# Patient Record
Sex: Female | Born: 1960
Health system: Southern US, Community
[De-identification: ages and names within clinical notes are randomized; demographics above are authoritative.]

## PROBLEM LIST (undated history)

## (undated) DIAGNOSIS — G4733 Obstructive sleep apnea (adult) (pediatric): Secondary | ICD-10-CM

## (undated) DIAGNOSIS — I4819 Other persistent atrial fibrillation: Secondary | ICD-10-CM

## (undated) DIAGNOSIS — R0902 Hypoxemia: Secondary | ICD-10-CM

## (undated) DIAGNOSIS — I4892 Unspecified atrial flutter: Secondary | ICD-10-CM

## (undated) DIAGNOSIS — F32A Depression, unspecified: Secondary | ICD-10-CM

## (undated) DIAGNOSIS — K219 Gastro-esophageal reflux disease without esophagitis: Secondary | ICD-10-CM

## (undated) DIAGNOSIS — I2699 Other pulmonary embolism without acute cor pulmonale: Secondary | ICD-10-CM

## (undated) DIAGNOSIS — R7303 Prediabetes: Secondary | ICD-10-CM

## (undated) DIAGNOSIS — I509 Heart failure, unspecified: Secondary | ICD-10-CM

## (undated) DIAGNOSIS — I10 Essential (primary) hypertension: Secondary | ICD-10-CM

## (undated) DIAGNOSIS — F419 Anxiety disorder, unspecified: Secondary | ICD-10-CM

## (undated) DIAGNOSIS — J449 Chronic obstructive pulmonary disease, unspecified: Secondary | ICD-10-CM

## (undated) DIAGNOSIS — E119 Type 2 diabetes mellitus without complications: Secondary | ICD-10-CM

## (undated) DIAGNOSIS — D649 Anemia, unspecified: Secondary | ICD-10-CM

## (undated) DIAGNOSIS — C801 Malignant (primary) neoplasm, unspecified: Secondary | ICD-10-CM

## (undated) DIAGNOSIS — G4730 Sleep apnea, unspecified: Secondary | ICD-10-CM

## (undated) DIAGNOSIS — F329 Major depressive disorder, single episode, unspecified: Secondary | ICD-10-CM

## (undated) DIAGNOSIS — E785 Hyperlipidemia, unspecified: Secondary | ICD-10-CM

## (undated) DIAGNOSIS — I5032 Chronic diastolic (congestive) heart failure: Secondary | ICD-10-CM

## (undated) DIAGNOSIS — H269 Unspecified cataract: Secondary | ICD-10-CM

## (undated) DIAGNOSIS — N189 Chronic kidney disease, unspecified: Secondary | ICD-10-CM

## (undated) DIAGNOSIS — T7840XA Allergy, unspecified, initial encounter: Secondary | ICD-10-CM

## (undated) HISTORY — DX: Chronic diastolic (congestive) heart failure: I50.32

## (undated) HISTORY — DX: Hypoxemia: R09.02

## (undated) HISTORY — DX: Unspecified cataract: H26.9

## (undated) HISTORY — DX: Malignant (primary) neoplasm, unspecified: C80.1

## (undated) HISTORY — PX: OTHER SURGICAL HISTORY: SHX169

## (undated) HISTORY — DX: Major depressive disorder, single episode, unspecified: F32.9

## (undated) HISTORY — DX: Sleep apnea, unspecified: G47.30

## (undated) HISTORY — DX: Gastro-esophageal reflux disease without esophagitis: K21.9

## (undated) HISTORY — DX: Obstructive sleep apnea (adult) (pediatric): G47.33

## (undated) HISTORY — DX: Chronic obstructive pulmonary disease, unspecified: J44.9

## (undated) HISTORY — DX: Prediabetes: R73.03

## (undated) HISTORY — DX: Type 2 diabetes mellitus without complications: E11.9

## (undated) HISTORY — DX: Hyperlipidemia, unspecified: E78.5

## (undated) HISTORY — DX: Anxiety disorder, unspecified: F41.9

## (undated) HISTORY — DX: Other pulmonary embolism without acute cor pulmonale: I26.99

## (undated) HISTORY — DX: Unspecified atrial flutter: I48.92

## (undated) HISTORY — DX: Essential (primary) hypertension: I10

## (undated) HISTORY — DX: Depression, unspecified: F32.A

## (undated) HISTORY — DX: Chronic kidney disease, unspecified: N18.9

## (undated) HISTORY — DX: Allergy, unspecified, initial encounter: T78.40XA

## (undated) HISTORY — DX: Anemia, unspecified: D64.9

---

## 1994-02-08 HISTORY — PX: TUBAL LIGATION: SHX77

## 1998-01-12 ENCOUNTER — Emergency Department (HOSPITAL_COMMUNITY): Admission: EM | Admit: 1998-01-12 | Discharge: 1998-01-12 | Payer: Self-pay | Admitting: Emergency Medicine

## 1998-12-02 ENCOUNTER — Ambulatory Visit (HOSPITAL_COMMUNITY): Admission: RE | Admit: 1998-12-02 | Discharge: 1998-12-02 | Payer: Self-pay | Admitting: Obstetrics and Gynecology

## 1998-12-02 ENCOUNTER — Encounter: Payer: Self-pay | Admitting: Obstetrics and Gynecology

## 2001-12-15 ENCOUNTER — Other Ambulatory Visit: Admission: RE | Admit: 2001-12-15 | Discharge: 2001-12-15 | Payer: Self-pay | Admitting: Family Medicine

## 2001-12-20 ENCOUNTER — Encounter: Admission: RE | Admit: 2001-12-20 | Discharge: 2001-12-20 | Payer: Self-pay

## 2002-02-21 ENCOUNTER — Ambulatory Visit (HOSPITAL_COMMUNITY): Admission: RE | Admit: 2002-02-21 | Discharge: 2002-02-21 | Payer: Self-pay | Admitting: Gastroenterology

## 2003-09-12 ENCOUNTER — Other Ambulatory Visit: Admission: RE | Admit: 2003-09-12 | Discharge: 2003-09-12 | Payer: Self-pay | Admitting: Family Medicine

## 2003-09-19 ENCOUNTER — Encounter: Admission: RE | Admit: 2003-09-19 | Discharge: 2003-09-19 | Payer: Self-pay | Admitting: Family Medicine

## 2004-09-17 ENCOUNTER — Other Ambulatory Visit: Admission: RE | Admit: 2004-09-17 | Discharge: 2004-09-17 | Payer: Self-pay | Admitting: Family Medicine

## 2004-09-17 ENCOUNTER — Encounter: Admission: RE | Admit: 2004-09-17 | Discharge: 2004-09-17 | Payer: Self-pay | Admitting: Family Medicine

## 2004-10-07 ENCOUNTER — Encounter: Admission: RE | Admit: 2004-10-07 | Discharge: 2004-10-07 | Payer: Self-pay | Admitting: Family Medicine

## 2005-12-06 ENCOUNTER — Other Ambulatory Visit: Admission: RE | Admit: 2005-12-06 | Discharge: 2005-12-06 | Payer: Self-pay | Admitting: Internal Medicine

## 2006-01-04 ENCOUNTER — Encounter: Payer: Self-pay | Admitting: Pulmonary Disease

## 2006-01-25 ENCOUNTER — Encounter: Payer: Self-pay | Admitting: Pulmonary Disease

## 2006-12-15 ENCOUNTER — Ambulatory Visit (HOSPITAL_COMMUNITY): Admission: RE | Admit: 2006-12-15 | Discharge: 2006-12-15 | Payer: Self-pay | Admitting: Internal Medicine

## 2007-04-20 ENCOUNTER — Emergency Department (HOSPITAL_COMMUNITY): Admission: EM | Admit: 2007-04-20 | Discharge: 2007-04-20 | Payer: Self-pay | Admitting: Emergency Medicine

## 2007-05-25 ENCOUNTER — Ambulatory Visit: Payer: Self-pay

## 2007-05-29 ENCOUNTER — Encounter: Payer: Self-pay | Admitting: Internal Medicine

## 2007-05-29 ENCOUNTER — Ambulatory Visit: Payer: Self-pay | Admitting: Internal Medicine

## 2007-05-30 ENCOUNTER — Ambulatory Visit: Payer: Self-pay | Admitting: Cardiology

## 2007-05-30 ENCOUNTER — Ambulatory Visit: Payer: Self-pay

## 2007-06-02 ENCOUNTER — Ambulatory Visit: Payer: Self-pay | Admitting: Cardiology

## 2007-06-09 ENCOUNTER — Ambulatory Visit: Payer: Self-pay | Admitting: Internal Medicine

## 2007-06-09 ENCOUNTER — Ambulatory Visit: Payer: Self-pay

## 2007-06-09 ENCOUNTER — Ambulatory Visit: Payer: Self-pay | Admitting: Cardiology

## 2007-06-09 ENCOUNTER — Inpatient Hospital Stay (HOSPITAL_COMMUNITY): Admission: AD | Admit: 2007-06-09 | Discharge: 2007-06-13 | Payer: Self-pay | Admitting: Internal Medicine

## 2007-06-16 ENCOUNTER — Ambulatory Visit: Payer: Self-pay | Admitting: Cardiology

## 2007-06-16 ENCOUNTER — Ambulatory Visit: Payer: Self-pay | Admitting: Internal Medicine

## 2007-06-19 ENCOUNTER — Ambulatory Visit: Payer: Self-pay

## 2007-06-23 ENCOUNTER — Ambulatory Visit: Payer: Self-pay | Admitting: Internal Medicine

## 2007-06-29 ENCOUNTER — Ambulatory Visit: Payer: Self-pay | Admitting: Internal Medicine

## 2007-07-13 ENCOUNTER — Ambulatory Visit: Payer: Self-pay | Admitting: Internal Medicine

## 2007-07-20 DIAGNOSIS — K219 Gastro-esophageal reflux disease without esophagitis: Secondary | ICD-10-CM | POA: Insufficient documentation

## 2007-07-20 DIAGNOSIS — F331 Major depressive disorder, recurrent, moderate: Secondary | ICD-10-CM | POA: Insufficient documentation

## 2007-07-21 ENCOUNTER — Ambulatory Visit: Payer: Self-pay | Admitting: Pulmonary Disease

## 2007-07-21 DIAGNOSIS — G4733 Obstructive sleep apnea (adult) (pediatric): Secondary | ICD-10-CM | POA: Insufficient documentation

## 2007-07-21 DIAGNOSIS — J449 Chronic obstructive pulmonary disease, unspecified: Secondary | ICD-10-CM | POA: Insufficient documentation

## 2007-07-27 ENCOUNTER — Ambulatory Visit: Payer: Self-pay | Admitting: Cardiology

## 2007-08-08 ENCOUNTER — Ambulatory Visit: Payer: Self-pay | Admitting: Internal Medicine

## 2007-08-15 ENCOUNTER — Ambulatory Visit: Payer: Self-pay | Admitting: Cardiology

## 2007-08-30 ENCOUNTER — Ambulatory Visit: Payer: Self-pay | Admitting: Internal Medicine

## 2007-08-30 LAB — CONVERTED CEMR LAB
BUN: 13 mg/dL (ref 6–23)
CO2: 31 meq/L (ref 19–32)
Calcium: 8.6 mg/dL (ref 8.4–10.5)
Chloride: 103 meq/L (ref 96–112)
Creatinine, Ser: 1.2 mg/dL (ref 0.4–1.2)
GFR calc Af Amer: 62 mL/min
GFR calc non Af Amer: 51 mL/min
Glucose, Bld: 133 mg/dL — ABNORMAL HIGH (ref 70–99)
Potassium: 3.1 meq/L — ABNORMAL LOW (ref 3.5–5.1)
Sodium: 141 meq/L (ref 135–145)

## 2007-09-05 ENCOUNTER — Ambulatory Visit: Payer: Self-pay | Admitting: Cardiology

## 2007-09-15 ENCOUNTER — Ambulatory Visit: Payer: Self-pay | Admitting: Pulmonary Disease

## 2007-09-19 ENCOUNTER — Ambulatory Visit: Payer: Self-pay | Admitting: Internal Medicine

## 2007-09-19 LAB — CONVERTED CEMR LAB
BUN: 17 mg/dL (ref 6–23)
CO2: 30 meq/L (ref 19–32)
Calcium: 9.1 mg/dL (ref 8.4–10.5)
Chloride: 102 meq/L (ref 96–112)
Creatinine, Ser: 1 mg/dL (ref 0.4–1.2)
GFR calc Af Amer: 76 mL/min
GFR calc non Af Amer: 63 mL/min
Glucose, Bld: 124 mg/dL — ABNORMAL HIGH (ref 70–99)
Potassium: 3.2 meq/L — ABNORMAL LOW (ref 3.5–5.1)
Sodium: 140 meq/L (ref 135–145)

## 2007-09-26 ENCOUNTER — Ambulatory Visit: Payer: Self-pay | Admitting: Cardiology

## 2007-10-06 ENCOUNTER — Ambulatory Visit: Payer: Self-pay | Admitting: Internal Medicine

## 2007-10-24 ENCOUNTER — Ambulatory Visit: Payer: Self-pay | Admitting: Cardiovascular Disease

## 2007-10-24 ENCOUNTER — Ambulatory Visit: Payer: Self-pay | Admitting: Internal Medicine

## 2007-10-24 LAB — CONVERTED CEMR LAB
BUN: 15 mg/dL (ref 6–23)
CO2: 32 meq/L (ref 19–32)
Calcium: 8.8 mg/dL (ref 8.4–10.5)
Chloride: 108 meq/L (ref 96–112)
Creatinine, Ser: 0.9 mg/dL (ref 0.4–1.2)
GFR calc Af Amer: 86 mL/min
GFR calc non Af Amer: 71 mL/min
Glucose, Bld: 124 mg/dL — ABNORMAL HIGH (ref 70–99)
Potassium: 3.2 meq/L — ABNORMAL LOW (ref 3.5–5.1)
Sodium: 142 meq/L (ref 135–145)

## 2007-11-03 ENCOUNTER — Ambulatory Visit: Payer: Self-pay | Admitting: Internal Medicine

## 2007-11-21 ENCOUNTER — Ambulatory Visit: Payer: Self-pay | Admitting: Cardiovascular Disease

## 2007-11-21 ENCOUNTER — Ambulatory Visit: Payer: Self-pay | Admitting: Internal Medicine

## 2007-11-21 LAB — CONVERTED CEMR LAB
BUN: 10 mg/dL (ref 6–23)
CO2: 33 meq/L — ABNORMAL HIGH (ref 19–32)
Calcium: 8.9 mg/dL (ref 8.4–10.5)
Chloride: 106 meq/L (ref 96–112)
Creatinine, Ser: 1 mg/dL (ref 0.4–1.2)
GFR calc Af Amer: 76 mL/min
GFR calc non Af Amer: 63 mL/min
Glucose, Bld: 122 mg/dL — ABNORMAL HIGH (ref 70–99)
Potassium: 3.4 meq/L — ABNORMAL LOW (ref 3.5–5.1)
Sodium: 144 meq/L (ref 135–145)

## 2007-12-01 ENCOUNTER — Ambulatory Visit: Payer: Self-pay | Admitting: Internal Medicine

## 2007-12-04 ENCOUNTER — Ambulatory Visit: Payer: Self-pay | Admitting: Internal Medicine

## 2007-12-12 ENCOUNTER — Ambulatory Visit: Payer: Self-pay | Admitting: Internal Medicine

## 2008-07-10 ENCOUNTER — Encounter: Payer: Self-pay | Admitting: *Deleted

## 2008-08-14 ENCOUNTER — Encounter: Payer: Self-pay | Admitting: *Deleted

## 2008-09-20 ENCOUNTER — Encounter: Payer: Self-pay | Admitting: Cardiology

## 2008-09-24 ENCOUNTER — Encounter (INDEPENDENT_AMBULATORY_CARE_PROVIDER_SITE_OTHER): Payer: Self-pay | Admitting: *Deleted

## 2009-12-15 ENCOUNTER — Ambulatory Visit (HOSPITAL_COMMUNITY): Admission: RE | Admit: 2009-12-15 | Discharge: 2009-12-15 | Payer: Self-pay | Admitting: Internal Medicine

## 2010-03-10 NOTE — Medication Information (Signed)
Summary: [facility]madin Clinic    Referring MD: [name] PCP: Rachel Moulds Indication 1: Atrial Fibrillation (ICD-427.31) [place] Site: [place] INR RANGE 2 - 3           Allergies: 1)  ! Sulfa  Anticoagulation Management History:      Negative risk factors for bleeding include an age less than 52 years old.  The bleeding index is 'low risk'.  Negative CHADS2 values include Age > 30 years old.  The start date was 05/22/2007.    Anticoagulation Management Assessment/Plan:      The patient's current anticoagulation dose is Warfarin sodium 1 mg  tabs: as directed.  The target INR is 2 - 3.         Prior Anticoagulation Instructions: 5MG QD

## 2010-03-10 NOTE — Assessment & Plan Note (Signed)
Summary: rov for osa   Referred by:  Bensimhon PCP:  Rachel Moulds  Chief Complaint:  4 wk followup.  Pt states that she has been using cpap "on and off".  She accidentally pushed a button on her machine and thinks she changed the pressure.  She states before she did this and she noticed a definite improvement in her sleep and had more energy during the day.  Pt brought her machine today.Marland Kitchen  History of Present Illness: the patient comes in today for followup of her obstructive sleep apnea. At the last visit, she was started on CPAP at a moderate pressure to help with desensitization. Patient states that she was compliant with the device, and actually started seeing an improvement in how she slept and felt the next day. Unfortunately, she recently hit one of the buttons on her device during the middle of sleep, and since then the pressure has been different and abnormal. She has not been able to wear it consistently since that time. She is having no difficulties with her mask fit or leaks.     Current Allergies: ! SULFA      Vital Signs:  Patient Profile:   50 Years Old Female Height:     59 inches Weight:      231 pounds O2 Sat:      93 % O2 treatment:    Room Air Temp:     98.1 degrees F oral Pulse rate:   78 / minute BP sitting:   110 / 70  (left arm)  Vitals Entered By: Tilden Dome (September 15, 2007 4:00 PM)                 Physical Exam  General:     obese female in no acute distress Nose:     no skin breakdown or pressure necrosis from the CPAP mask      Impression & Recommendations:  Problem # 1:  OBSTRUCTIVE SLEEP APNEA (ICD-327.23) the patient had been doing well with her CPAP until she hit a button on the device which changed the settings. I will have her DME look at her machine and educate her about the various buttons, but she will also need pressure optimization with an auto titrating device. I have also encouraged her to work aggressively on weight  loss. I will call her with the results of her auto titrating machine.  Medications Added to Medication List This Visit: 1)  Klor-con 20 Meq Pack (Potassium chloride) .... Once daily 2)  Chlorthalidone 25 Mg Tabs (Chlorthalidone) .... Once daily 3)  Excedrin Migraine 250-250-65 Mg Tabs (Aspirin-acetaminophen-caffeine) .... As needed 4)  Cpap 10  .... At bedtime   Patient Instructions: 1)  will get apria to check your machine 2)  will get auto machine as a loaner for you to use for 2 wks to optimize your pressure. 3)  work on weight loss 4)  f/u with me in 68mo.   ]

## 2010-03-10 NOTE — Assessment & Plan Note (Signed)
Summary: consult for osa   Referred by:  Bensimhon PCP:  Rachel Moulds  Chief Complaint:  Sleep Consult.  History of Present Illness: the patient is a pleasant 50 year old female who been asked to see for obstructive sleep apnea.  She was diagnosed with very severe sleep apnea and 2007, with her sleep study showing an AHI of 70 events/hr and O2 desaturation as low as 79%.  The patient was placed on CPAP at an unknown pressure, and used for approximately 2 months.  She began to have issues with the logistics of the device, and was bothered the most by the tubing and also the pressure changes throughout the night.  This makes me wonder if she was on an auto set device?  The patient discontinued the CPAP after two months, and admits that she really did not give it a fair chance.  Currently, the patient typically goes to bed between 9 and 11, and arises at 630 to start her day.  She is not rested upon arising.  She has been told that she still has significant snoring.  Surprisingly, the patient denies inappropriate daytime sleepiness, and feels that her issues in the past may have been due to medication.  Of note, the patient's weight is up 20 pounds since her last sleep study.     Current Allergies: ! SULFA  Past Medical History:    hypertension    hyperlipidemia    borderline diabetes    obstructive sleep apnea    paroxysmal atrial fibrillation    G E R D    Depression    anxiety   Family History:    cancer-colon mother    coronary artery disease- mother & father    emphysema: father    asthma: father  Social History:    married with 4 children    Patient states former smoker.    Risk Factors:  Tobacco use:  quit    Year quit:  03/1998    Pack-years:  3 to 4 ppd    Comments:  smoked x 20 years  Alcohol use:  no   Review of Systems      See HPI   Vital Signs:  Patient Profile:   50 Years Old Female Height:     59 inches Weight:      225.25 pounds O2 Sat:      97  % O2 treatment:    Room Air Temp:     98.3 degrees F oral Pulse rate:   81 / minute BP sitting:   110 / 64  (left arm) Cuff size:   regular  Vitals Entered By: Valrie Hart LPN (July 20, 6801 2:12 PM)             Comments Medications reviewed with patient Valrie Hart LPN  July 20, 2480 5:00 PM      Physical Exam  General:     morbidly obese female in no acute distress Eyes:     PERRLA and EOMI.   Nose:     deviated septum to the left Mouth:     elongation of the soft palate and uvula Neck:     no JVD, thyromegaly, or lymphadenopathy. Lungs:     totally clear to auscultation Heart:     regular rate and rhythm, no enlarging Abdomen:     soft and nontender, bowel sounds present Extremities:     trace edema, prominent varicosities Neurologic:     alert and oriented, moves all 4 extremities.  Impression & Recommendations:  Problem # 1:  OBSTRUCTIVE SLEEP APNEA (ICD-327.23) the patient has a history of severe obstructive sleep apnea by prior sleep study.  She has been intolerant of CPAP in the past, though admits that she really did not give it a fair chance.  I had a long discussion with her about sleep apnea, including its impact on her quality of life and cardiovascular health.  I have also discussed with her its impact on cardiac arrhythmias, and that it is essential we treat her sleep apnea.  I think that she would do better with a full face mask, and will start her on CPAP at a moderate pressure level until we can get her desensitized.  She will is to call with any issues over the next 4 weeks so that we can troubleshoot the device.  I also encouraged her to work aggressively on weight loss.  Medications Added to Medication List This Visit: 1)  Metoprolol Tartrate 50 Mg Tabs (Metoprolol tartrate) .... Take 1/2 tab by mouth two times a day   Patient Instructions: 1)  start cpap at 10cm with full face mask 2)  work on weight loss 3)  f/u with me in 4  weeks.   ]

## 2010-03-10 NOTE — Miscellaneous (Signed)
Summary: Orders Update pft charges  Clinical Lists Changes  Orders: Added new Service order of Carbon Monoxide diffusing w/capacity (94720) - Signed Added new Service order of Lung Volumes (94240) - Signed Added new Service order of Spirometry (Pre & Post) (94060) - Signed 

## 2010-03-10 NOTE — Letter (Signed)
Summary: [facility] [place], [place]  [address] [place]   [place], [place] [address]   Phone: [phone]  Fax: [phone]     September 24, 2008 MRN: [id number]   [place], [place]  [address]   Dear [name],  This letter is being sent to you as a reminder that it is necessary for you to get your INR/PT checked regularly so that we can optimize your care.  Our records indicate that you were scheduled to have a test done recently.  As of today, we have not received the results of this test.  It is very important that you have your INR checked.  Please call our office at the number listed above to schedule an appointment at your earliest convenience.    If you have recently had your protime checked or have discontinued this medication, please contact our office at the above phone number to clarify this issue.  Thank you for this prompt attention to this important health care matter.  Sincerely,   [facility]

## 2010-06-23 NOTE — H&P (Signed)
[name], [place] NO.:  [id number]   MEDICAL RECORD NO.:  [id number]          PATIENT TYPE:  INP   LOCATION:  2025                         FACILITY:  [facility]   PHYSICIAN:  Shaune Pascal. Bensimhon, MDDATE OF BIRTH:  [birth year]   DATE OF ADMISSION:  06/09/2007  DATE OF DISCHARGE:                              HISTORY & PHYSICAL   PRIMARY CARE PHYSICIAN:  Dr. Rachel Moulds.   REASON FOR ADMISSION:  Rapid atrial fibrillation.   HISTORY OF PRESENT ILLNESS:  [name] is a delightful 50 year old  woman with a history of obesity, hypertension, hyperlipidemia,  borderline diabetes and obstructive sleep apnea with noncompliance to  CPAP.  I saw her for the first time several weeks ago.  She was  diagnosed with paroxysmal atrial fibrillation.  She had a Life Watch  monitor on, and she had multiple episodes of atrial fibrillation with  heart rates over 150.  We considered admitting her then, but she was  quite against this so we started her on a beta blocker and also  eventually added in diltiazem which she was unable to tolerate.  She had  an echocardiogram that showed normal LV function.   She came in today for a treadmill Myoview, and she was back in atrial  fibrillation with a heart rate of 160.  She says she has felt like she  has been in out.  She says this makes her feel short of breath and a  little funny, but she has not had recent chest pain.  She has had some  before.   We gave her a dose of 5 mg of Lopressor in the clinic, and she  eventually converted to sinus rhythm in the 80s.  She is now  asymptomatic.   PROBLEM LIST:  1. Paroxysmal atrial fibrillation with high ventricular rates.  2. Obesity.  3. Diabetes.  4. Hypertension.  5. Hyperlipidemia.  6. Sleep apnea, noncompliant with CPAP.  7. Gastroesophageal reflux disease.  8. Anxiety/depression.   CURRENT MEDICATIONS:  1. Metoprolol 50 b.i.d.  2. Trazodone 100 daily.  3. Cymbalta 60 daily.  4.  Coumadin.   ALLERGIES:  SULFA.   REVIEW OF SYSTEMS:  Notable for palpitations and exertional dyspnea as  well as arthritis pain and fatigue.  The remainder of review of systems  is negative except for HPI and problem list.  There has been no  bleeding.   SOCIAL HISTORY:  She is married, has 4 kids.  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  She used to smoke 3-4 packs of  cigarettes a day for 20 years, quit in February 2000.  No alcohol use.  No drug use.   FAMILY HISTORY:  Mother died at 95 from colon cancer.  Father 98 and  alive.  There is a history of coronary artery disease on both sides of  the family.   PHYSICAL EXAM:  She is in no acute distress, ambulatory in our clinic  without respiratory difficulty.  Blood pressure is 120/70, heart rate is now 80.  HEENT:  Normal.  NECK:  Supple.  There is no JVD.  Carotids are 2+ bilaterally without  bruits.  There is no lymphadenopathy or thyromegaly.  CARDIAC:  She initially had a rapid irregular rhythm; now she is in  normal rhythm.  No murmurs.  LUNGS:  Clear.  ABDOMEN:  Obese, nontender, nondistended, no hepatosplenomegaly, no  bruits.  No masses.  Good bowel sounds.  EXTREMITIES:  Warm with no  cyanosis, clubbing or edema.  No rash.  NEURO:  Alert and x3.  Cranial nerves 2-12 are intact.  Moves all 4  extremities without difficulty.  Affect is very pleasant.   EKG shows atrial fibrillation with rapid ventricular response at a rate  of 160.  Followup EKG shows normal sinus rhythm at a rate of 80, no ST-T  wave abnormalities.   ASSESSMENT:  1. Paroxysmal atrial fibrillation with rapid ventricular response not      responding well to arteriovenous nodal blockers.  2. Hypertension.  3. Hyperlipidemia.  4. Obesity.   DISPOSITION:  She is failing outpatient rate control management of her  atrial fibrillation.  We will admit her for a Tikosyn load.  She will be  loaded on Coumadin as well.  She will  eventually need to get a stress  test to rule out underlying coronary artery disease.      Shaune Pascal. Bensimhon, MD  Electronically Signed     DRB/MEDQ  D:  06/09/2007  T:  06/09/2007  Job:  [id number]

## 2010-06-23 NOTE — Assessment & Plan Note (Signed)
[facility]                            CARDIOLOGY OFFICE NOTE   [name]                      MRN:          [id number]  DATE:12/04/2007                            DOB:          September 13, 1960    PRIMARY CARE PHYSICIAN:  Darcus Austin, DO   INTERVAL HISTORY:  [name] is a very pleasant 50 year old woman with a  history of obesity, hypertension, hyperlipidemia, borderline diabetes,  obstructive sleep apnea, and paroxysmal atrial fibrillation.  She  initially failed Tikosyn due to QT prolongation and now she has been on  flecainide and is doing quite well.   She denies any prolonged palpitations.  She says when she is fighting  with her husband she sometimes feels some brief palpitations, but feels  this is mostly anxiety, it has gotten better with Xanax.  She has not  had any syncope or presyncope.  She does have some chronic dyspnea.   She is still having trouble with her CPAP mask.  She has seen Dr.  Gwenette Greet, but it really has not made it work very well.  She does have a  mild wheeze over the past day or two and thinks she maybe developing  some bronchitis.  This is also associated with a cough, but no fevers  and chills.  Finally, we have been struggling with her hypokalemia and  we recently started her on spironolactone.   CURRENT MEDICATIONS:  1. Flecainide 75 b.i.d.  2. Trazodone 100 mg at night.  3. Cymbalta.  4. Metoprolol 25 b.i.d.  5. Coumadin.  6. Spironolactone 25 a day.  7. Potassium 60 a day.  8. Chlorthalidone 25 a day.   PHYSICAL EXAMINATION:  GENERAL:  She is in no acute distress.  Ambulates  around the clinic without any respiratory difficulty.  VITAL SIGNS:  Blood pressure is 124/72, heart rate 73, weight is 227.  HEENT:  Normal.  NECK:  Supple.  No obvious JVD though it is hard to tell.  Her neck is  quite thick.  Carotids are 2+ bilaterally without bruits.  There is no  lymphadenopathy or thyromegaly.  CARDIAC:   PMI is nondisplaced.  Regular rate and rhythm.  No murmurs,  rubs, or gallops.  LUNGS:  Mild end-expiratory wheezing.  No crackles.  ABDOMEN:  Obese, nontender, nondistended.  No hepatosplenomegaly.  No  bruits.  No masses.  Good bowel sounds.  EXTREMITIES:  Warm with no cyanosis, clubbing, or edema.  NEUROLOGIC:  Alert and oriented x3.  Cranial nerves II through XII are  intact.  Moves all 4 extremities without difficulty.  Affect is  pleasant.   EKG shows sinus rhythm at a rate of 73, QRS is 76 msec, PR interval is  146 msec.   ASSESSMENT AND PLAN:  1. Atrial fibrillation.  She is maintaining sinus rhythm on      flecainide.  Continue current therapy.  2. Hypertension, well controlled.  3. Wheezing.  I think she is developing some mild bronchitis.  We will      start her on a Xopenex inhaler.  She will follow up with  her      primary care physician.  I told her if she is developing fever or      chills, this needs be done sooner rather than later.  4. Hypokalemia.  Her most recent potassium is 3.4.  She is started on      spironolactone.  We will recheck in 2 weeks.  I have told her that      she can use Mrs. Dash.  This will help supplement her potassium.  5. Lower extremity edema.  This has resolved.  Continue current      therapy.  I would be reluctant to stop her thiazide diuretics      unless absolutely necessary.   DISPOSITION:  We will see her back in 4 months for routine followup.     Shaune Pascal. Bensimhon, MD  Electronically Signed    DRB/MedQ  DD: 12/04/2007  DT: 12/05/2007  Job #: [id number]

## 2010-06-23 NOTE — Assessment & Plan Note (Signed)
[place]                                 ON-CALL NOTE   AMORETTE, CHARRETTE                      MRN:          [id number]  DATE:05/28/2007                            DOB:          February 01, 1961    PRIMARY CARDIOLOGIST:  Deboraha Sprang, M.D., [facility]   [name] is a 50 year old female who was seen by Dr. Caryl Comes on May 26, 2007.  That dictation is not available.  She stated that she had a  prescription for Cardizem but had not picked it up yet.  The call came  from [facility] stating that [name]'s heart rate was running up into  the 160s and 170s and she was in a atrial fibrillation.  They stated  that she had been in and out of atrial fibrillation all night long, but  this morning her rate was tending to be higher when she was in it.  I  discussed the situation with [name].  She stated she was feeling a  little tired and short of breath when she tried to do anything but was  not having chest pain or dizziness.  She stated she was having  palpitations and could tell that she was in an abnormal rhythm.  I  advised her that she should not drive but she should get someone to go  and pick up the Cardizem and take it or else she should come to the  emergency room.  She stated she preferred to go get the Cardizem and  take it and would come to the emergency room if her symptoms did not  improve on the medication.  She also stated she would come to the  emergency room if she developed chest pain, shortness of breath at rest,  or dizziness.      [name], PA-C  Electronically Signed      Denice Bors. Stanford Breed, MD, [facility]  Electronically Signed   RB/MedQ  DD: 05/28/2007  DT: 05/28/2007  Job #: [id number]

## 2010-06-23 NOTE — Discharge Summary (Signed)
NAMEJOELLYN, [name] NO.:  [id number]   MEDICAL RECORD NO.:  [id number]          PATIENT TYPE:  INP   LOCATION:  2025                         FACILITY:  [facility]   PHYSICIAN:  Shaune Pascal. Bensimhon, MDDATE OF BIRTH:  10/17/1960   DATE OF ADMISSION:  06/09/2007  DATE OF DISCHARGE:  06/13/2007                               DISCHARGE SUMMARY   PROCEDURE:  2-D echocardiogram.   PRIMARY FINAL DISCHARGE DIAGNOSIS:  Paroxysmal atrial fibrillation,  failed Tikosyn with flecainide added.   SECONDARY DIAGNOSES:  1. Obesity.  2. Diabetes.  3. Hypertension.  4. Hyperlipidemia.  5. Sleep apnea, noncompliant with continuous positive airway pressure.  6. Gastroesophageal reflux disease.  7. Anxiety and depression.  8. Family history of coronary artery disease.   TIME OF DISCHARGE:  42 minutes.   HOSPITAL COURSE:  [name] is a 50 year old female with no previous  history of documented coronary artery disease.  She had a life watch  monitor which showed atrial fibrillation and an echocardiogram which  showed normal left ventricular function.  When she came in for her  stress test, she was in rapid AFib and was admitted for further  evaluation.   She was initiated on Tikosyn, but did not tolerate this with increased  QTCs.  This medication was discontinued and her QTC returned to normal.  Flecainide was initiated, and she spontaneously converted to sinus  rhythm.  She had some sinus bradycardia and in the 3-beat run of V-tach  as well.  Her beta-blocker was restarted but at a lower dose.  Her CHADS  sore is 2, and Coumadin had been initiated as an outpatient but she was  not yet therapeutic.  She was started on heparin, and her Coumadin was  dosed such that by discharge she was therapeutic having received two  doses of 10 mg, one dose of 7.5 mg, and one dose of 5 mg.  Her INR at  discharge was 2.6.   She had no chest pain and no shortness of breath.  She did have a  3-  minute run of rapid AFib on Jun 12, 2007, but was asymptomatic with this  as it was brief.  Dr. Haroldine Laws evaluated [name] on Jun 13, 2007,  and felt that at this point flecainide is the most appropriate drug for  her.  Her blood pressure was well-controlled, and she was asymptomatic.  He felt that she could be safely discharged home with close outpatient  follow up in the Coumadin Clinic and with an exercise Myoview.   DISCHARGE INSTRUCTIONS:  Her activity level is to be increased  gradually.  She is encouraged to stick to a low-sodium diabetic diet.  She is to follow up in the office with a Coumadin check on Jun 16, 2007,  at 8 a.m. and with the treadmill stress test on Jun 19, 2007, at 11:45.  This is looking for ischemia as well as proarrhythmia on flecainide.  Therefore, she is to take all medications including metoprolol and  flecainide the a.m. of the test.  She is to follow up with Dr. Haroldine Laws  on Jun 29, 2007, at 8 a.m. and with Dr. Johnnye Sima as needed.   DISCHARGE MEDICATIONS:  1. Metoprolol 25 mg b.i.d.  2. Trazodone 100 mg nightly.  3. Cymbalta 60 mg daily.  4. Flecainide 75 mg b.i.d.  5. Coumadin 5 mg daily or as directed.   She is to continue the [place] monitor until she sees Dr. Haroldine Laws.      [name], PA-C      Shaune Pascal. Bensimhon, MD  Electronically Signed    RB/MEDQ  D:  06/13/2007  T:  06/14/2007  Job:  [id number]   cc:   [facility] Clinic  Darcus Austin, D.O.

## 2010-06-23 NOTE — Assessment & Plan Note (Signed)
[facility]                            CARDIOLOGY OFFICE NOTE   Robin, Spindle TAMANI Medina                      MRN:          [id number]  DATE:05/29/2007                            DOB:          [birth year]    REFERRING PHYSICIAN:  Darcus Austin, D.O.   PRIMARY CARE PHYSICIAN/REFERRING:  Dr. Rachel Moulds.   REASON FOR CONSULTATION:  Rapid atrial fibrillation.   HISTORY OF PRESENT ILLNESS:  [name] is a delightful 50 year old  woman with a history of obesity, hypertension, hyperlipidemia and  borderline diabetes and obstructive sleep apnea with noncompliance to  CPAP.   Over the past 3 months, she has noticed spells of palpitations and not  feeling right.  This progressed last month and she went to the work  nurse who noted her to have an irregular heartbeat.  EKG showed what  appeared to be atrial fibrillation.  She was referred to see Dr. Rachel Moulds, who placed a heart monitor on her.  Over the past week or so,  she has had multiple LifeWatch reports of paroxysmal atrial fibrillation  with ventricular rates in excess of 150.  These were just mildly  symptomatic.  She says that she occasionally feels a racing and gets  tightness in her chest and some shortness of breath, though these are  vague feelings.   I was the doc-of-the-day on Friday in the office.  I was brought these  strips.  We contacted her and had her start diltiazem 100 mg day over  the weekend and come in this morning for follow-up.  She had taken the  medication, however, we continue to receive transmissions of paroxysmal  atrial fibrillation with heart rates very close to 150.  At other times  she has been in sinus rhythm.   She says her exercise tolerance is very poor. She gets short of breath  with any activity, but does not necessarily experience chest pain.  She  does have some pain in her lower extremities, but these are mostly in  her shins.  She does have  occasional calf pain but not particularly  claudication.  She has not had syncope or presyncope.  She does have  occasional swelling in her ankles.   Apparently about 10 years ago she had a stress echo for chest pain but  was told this was normal.   REVIEW OF SYSTEMS:  Notable for occasional winter asthma, constipation,  fatigue, anxiety/depression and gastroesophageal reflux disease.  No  focal neurologic symptoms.  Remainder of the review of systems is  negative except for as described in the HPI and problem list.   PROBLEM LIST:  1. Obesity.  2. Borderline diabetes.  3. Hypertension.  4. Hyperlipidemia.  5. Sleep apnea, noncompliant with CPAP.  6. Gastroesophageal reflux disease.  7. Anxiety/depression.   CURRENT MEDICATIONS:  1. Diltiazem CD 180 mg a day.  2. Aspirin 325 a day.  3. Previously on benazepril, but this is on hold now.  4. Cymbalta 60 mg day.  5. Trazodone 100 mg at night.   ALLERGIES:  SULFA.   SOCIAL  HISTORY:  She is married with four kids.  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  She use to smoke 3-4 packs a day  of tobacco for 20 years, quit February 2000.  Denies alcohol use, no  drug use.   FAMILY HISTORY:  Mother died at 14 from colon cancer.  Father is 59 and  alive.  There is a history of coronary artery disease on both sides of  the family.   PHYSICAL EXAMINATION:  GENERAL APPEARANCE:  She is no acute distress.  She ambulates around the clinic without any respiratory difficulty.  VITAL SIGNS:  Blood pressure is 140/100 with a heart rate 96, weight  220.  HEENT:  Normal.  NECK:  Thick, hard to assess JVP but it looks flat.  Carotids are 2+  bilaterally without bruits.  There is no lymphadenopathy or thyromegaly.  CARDIAC:  PMI is nondisplaced.  She is regular with no murmurs, rubs or  gallops.  LUNGS:  Clear.  ABDOMEN:  Obese, nontender, unable to assess for hepatosplenomegaly.  No  bruits, no masses.  Good bowel  sounds.  EXTREMITIES:  Warm with no cyanosis, clubbing or edema.  DP pulses are  2+ bilaterally.  There is no rash.  NEURO:  Alert and oriented x3.  Cranial nerves II-XII are intact.  Moves  all four extremities without difficulty.  Affect is pleasant.   EKG shows normal sinus rhythm at a rate of 88 with left atrial  enlargement, no ST-T wave abnormalities.  I have reviewed multiple  LifeWatch strips and these all show episodes of paroxysmal atrial  fibrillation with rapid ventricular response with just minimal response  to Cardizem.   ASSESSMENT/PLAN:  1. Paroxysmal atrial fibrillation.  She is having quite rapid heart      beats.  I suggested that we consider possible hospitalization to      get this under control.  She is quite adverse to this.  Thus at      this point, what we will do is we will switch her over to Lopressor      50 mg b.i.d., will get an echocardiogram today.  Should she have      normal left ventricular function, we will start her on flecainide      100 b.i.d.  Given her CHADS score of 2, we will also start her on      Coumadin.  She will need a stress test to rule out ischemia and      also rule out proarrhythmia in the next couple of weeks.  Hopefully      we will be able to get a treadmill Myoview.  2. Hypertension.  This is suboptimally controlled.  We will continue      to work on this with the help with Dr. Johnnye Sima.  3. Obstructive sleep apnea.  I stressed to her that this is likely      exacerbating her atrial fibrillation.  We will need to have her be      more compliant with this if at all possible.  4. Disposition.  Pending the results of her workup.  We will also need      to make sure that her thyroid panel was checked.  She said this.      We will need just to confirm that she is not hyperthyroid.   DISPOSITION:  Will see her back in a few weeks for routine follow-up.  Shaune Pascal. Bensimhon, MD  Electronically Signed    DRB/MedQ  DD:  05/29/2007  DT: 05/29/2007  Job #: [id number]

## 2010-06-23 NOTE — Assessment & Plan Note (Signed)
[facility]                            CARDIOLOGY OFFICE NOTE   [name], [name]                      MRN:          [id number]  DATE:08/08/2007                            DOB:          [birth year]    PRIMARY CARE PHYSICIAN:  Darcus Austin, DO.   INTERVAL HISTORY:  [name] is a very pleasant, 50 year old woman with a  history of obesity, hypertension, hyperlipidemia, borderline diabetes,  obstructive sleep apnea, and paroxysmal atrial fibrillation.   Early this year, she had quite a time with her atrial fibrillation.  We  initially tried Tikosyn and this failed QT prolongation.  She has now  been on flecainide for several months and is doing well.  She denies any  palpitations.  She feels much better.   She does note that she continues to be somewhat short of breath.  She is  seeing Dr. Gwenette Greet for her sleep apnea and the process of receiving her  with another mask.  She does note some mild lower extremity edema, but  no orthopnea or PND.  She does not wheeze.  She previously had a Myoview  which showed an EF of 74% with no evidence of ischemia.   CURRENT MEDICATIONS:  1. Trazodone 100 a day.  2. Cymbalta 600 daily.  3. Flecainide 75 b.i.d.  4. Metoprolol 25 b.i.d.  5. Coumadin.   PHYSICAL EXAMINATION:  She is in no acute distress, ambulatory in the  clinic without respiratory difficulty.  Blood pressure is 124/86, heart rate 85, and weight is 224.  HEENT:  Normal.  NECK:  Supple.  No JVD.  Carotid are 2+ bilaterally without bruits.  There is no lymphadenopathy or thyromegaly.  CARDIAC:  PMI is nondisplaced.  She has regular rate and rhythm.  No  murmurs, rubs, or gallops.  LUNGS:  Clear.  ABDOMEN:  Obese, nontender, and nondistended.  No hepatosplenomegaly.  No bruits.  No masses.  Good bowel sounds.  EXTREMITIES:  Warm.  No  cyanosis or clubbing.  There is 2+ edema bilaterally.  NEUROLOGIC:  Alert and oriented x3.  Cranial nerves II  through XII are  intact.  Moves all 4 extremities without difficulty.  Affect is  pleasant.   EKG shows sinus rhythm with no ST-T wave abnormalities, PR interval is  148 milliseconds, and QRS is 68 milliseconds.   ASSESSMENT:  1. Paroxysmal atrial fibrillation.  She is maintaining sinus rhythm      well on flecainide at a low dose.  We previously considered going      up to 100 b.i.d., but I think she is doing just fine where she is      at.  We will continue Coumadin.  2. Hypertension, well controlled.  3. Lower extremity edema.  We will go ahead and start her on      chlorthalidone 25 mg a day as well as potassium 20 a day.  We will      check a BMET in 2 weeks.  Should this not be sufficient, I told her      to get back to [place]  and we can start on low-dose Lasix.  4. Obstructive sleep apnea per Dr. Gwenette Greet.  5. Dyspnea.  I am hoping that diuretic may help some.  We will also      get PFTs given her history of smoking.  6. Diabetes and cholesterol, she is followed by Dr. Johnnye Sima.  I      encouraged her to follow back up with Dr. Johnnye Sima for continued      treatment of these.  She would probably benefit from a statin to      get her LDL under 70.   DISPOSITION:  I will see her back in clinic in several months for  followup.     Shaune Pascal. Bensimhon, MD  Electronically Signed    DRB/MedQ  DD: 08/08/2007  DT: 08/08/2007  Job #: [id number]   cc:   Darcus Austin, D.O.

## 2010-06-23 NOTE — Assessment & Plan Note (Signed)
[facility]                            CARDIOLOGY OFFICE NOTE   Markala, Sitts KEISHANA KLINGER                      MRN:          [id number]  DATE:06/29/2007                            DOB:          12-10-1960    PRIMARY CARE PHYSICIAN:  Darcus Austin, D.O.   INTERVAL HISTORY:  [name] is a very pleasant 50 year old woman with a  history of obesity, hypertension, hyperlipidemia, borderline diabetes,  obstructive sleep apnea, and paroxysmal atrial fibrillation.   She has been having quite a time with her atrial fibrillation.  We had  admitted her for a Tikosyn load, but she failed this due to QT  prolongation.  We then initiated flecainide.  Initially, she had some  problems with bradycardia, but this seemed to straighten it out, but it  did limit our dose of the medication.   She returns today for routine followup.  She says that since being home  for 2 weeks she has not had any notable episodes that she has felt of  atrial fibrillation.  She once missed her morning dose of flecainide and  felt that the fluttering was about to come back, but she took her dose  and it went away.  She did have a monitor on for sometime during this.  From what I see, it is all sinus rhythm.   Unfortunately, she does feel much more fatigued than previously.  She  says even with going out to [employer] she feels run down and cannot do  what she needs to do.  She has not had any chest pain since the atrial  fibrillation has resolved.  She had a stress Myoview for which she walk  4 minutes and 15 seconds on a Bruce protocol.  EF was 74%.  There was no  evidence of ischemia.   She has been having some difficulty with her vision.  She thinks it is  her glasses.   CURRENT MEDICATIONS:  1. Trazodone 100 mg a day.  2. Cymbalta 60 a day.  3. Flecainide 75 b.i.d.  4. Metoprolol 25 b.i.d.  5. Coumadin.   PHYSICAL EXAMINATION:  GENERAL:  She is in no acute distress.  She  ambulates  around the clinic without any respiratory difficulty.  VITAL SIGNS:  Blood pressure is 124/80, heart rate is 73, weight is 222  pounds.  HEENT:  Normal.  NECK:  Supple.  No JVD.  Carotids are 2+ bilateral, without bruits.  There is no lymphadenopathy or thyromegaly.  No JVD.  CARDIAC:  PMI is nondisplaced.  She has distant heart sounds.  She is  regular.  No obvious murmurs.  LUNGS:  Clear.  ABDOMEN:  Obese, nontender, nondistended.  No hepatosplenomegaly.  No  bruits, no masses.  Good bowel sounds.  EXTREMITIES:  Warm, with no cyanosis, clubbing, or edema.  No rash.  NEUROLOGIC:  Alert and oriented x3.  Cranial nerves II-XII are intact.  Moves all four extremities without difficulty.  Affect is pleasant..   EKG:  Shows normal sinus rhythm at a rate of 73.  PR interval is 146  msec, QRS  duration 78 msec.   ASSESSMENT AND PLAN:  1. Paroxysmal atrial fibrillation.  This seems to be well controlled      on flecainide 75 b.i.d..  Initially, I thought we might be able to      go up on the dose to 100 b.i.d., but I am quite concerned about her      fatigue and wonder if this is related to her medications.  We have      decided to send her for a sleep study to reevaluate her sleep apnea      and try to get her on an exercise program.  If she remains      intolerant of her antiarrhythmic therapy, then we might consider      her as a candidate for atrial fibrillation ablation with Dr. Thompson Grayer in the next few months.  Will continue Coumadin for now.  2. Hypertension, well controlled.  3. Vision problems.  We checked a blood sugar today just to make sure      her sugar was not out of control.  It was only 101.  She will      follow up with her primary care doctor and possibly an eye doctor.     Shaune Pascal. Bensimhon, MD  Electronically Signed    DRB/MedQ  DD: 06/29/2007  DT: 06/29/2007  Job #: [id number]

## 2010-06-26 NOTE — Op Note (Signed)
NAME:  [name], [name]                         ACCOUNT NO.:  [id number]   MEDICAL RECORD NO.:  [id number]                   PATIENT TYPE:  AMB   LOCATION:  ENDO                                 FACILITY:  [facility]   PHYSICIAN:  Nelwyn Salisbury, M.D.               DATE OF BIRTH:  Mar 13, 1960   DATE OF PROCEDURE:  02/21/2002  DATE OF DISCHARGE:                                 OPERATIVE REPORT   PROCEDURE PERFORMED:  Colonoscopy.   ENDOSCOPIST:  Nelwyn Salisbury, M.D.   INSTRUMENT USED:  Olympus video colonoscope.   INDICATIONS FOR PROCEDURE:  Rectal bleeding in a 50 year old white female.  Rule out colonic polyps, masses, etc.  The patient has a family history of  colon cancer in her mother and maternal aunt.   PREPROCEDURE PREPARATION:  Informed consent was procured from the patient.  The patient was fasted for eight hours prior to the procedure and prepped  with a bottle of magnesium citrate and a gallon of NuLytely the night prior  to the procedure.   PREPROCEDURE PHYSICAL:  VITAL SIGNS:  The patient had stable vital signs.   NECK:  Supple.   CHEST:  Clear to auscultation.   CARDIAC:  S1 and S2 regular.   ABDOMEN:  Soft with normal bowel sounds.   DESCRIPTION OF PROCEDURE:  The patient was placed in the left lateral  decubitus position and sedated with Demerol and Versed from the EGD.  No  additional sedation was used for the colonoscopy.  Once the patient was  adequately positioned and maintained on low-flow oxygen and continuous  cardiac monitoring, The Olympus video colonoscope was advanced from the  rectum to the cecum.  The entire colonic mucosa appeared healthy with a  normal vascular pattern.  The appendicular orifice and the ileocecal valve  were clearly visualized and photographed.  There was some residual stool in  the colon.  Multiple washings were done.  No masses, polyps, erosions,  ulcers and no diverticula were seen.  Retroflexion view revealed no other  abnormalities.   IMPRESSION:  Normal colonoscopy.   RECOMMENDATIONS:  1. Continue a high-fiber diet with liberal fluid intake as previously     advised.  2.     Considering the patient's family history of colon cancer, repeat colorectal      cancer screening has been recommended in the next five years and if the     patient develops any abnormal symptoms, then can come for next outpatient     followup in the next two weeks or earlier if need be.                                               Nelwyn Salisbury, M.D.    JNM/MEDQ  D:  02/21/2002  T:  02/22/2002  Job:  [id number]   cc:   Lulu Riding, M.D.  [address]  [place], [place] [address]  Fax: [phone]

## 2010-06-26 NOTE — Op Note (Signed)
   NAME:  [name], [name]                         ACCOUNT NO.:  [id number]   MEDICAL RECORD NO.:  [id number]                   PATIENT TYPE:  AMB   LOCATION:  ENDO                                 FACILITY:  [facility]   PHYSICIAN:  Nelwyn Salisbury, M.D.               DATE OF BIRTH:  08-Sep-1960   DATE OF PROCEDURE:  02/21/2002  DATE OF DISCHARGE:                                 OPERATIVE REPORT   PROCEDURE PERFORMED:  Esophagogastroduodenoscopy.   ENDOSCOPIST:  Nelwyn Salisbury, M.D.   INSTRUMENT USED:  Olympus video panendoscope.   INDICATIONS FOR PROCEDURE:  Epigastric pain with rectal bleeding in a 10-  year-old white female with severe reflux.  Rule out peptic ulcer disease,  esophagitis, gastritis, etc.   PREPROCEDURE PREPARATION:  Informed consent was procured from the patient.  The patient fasted for eight hours prior to the procedure.   PREPROCEDURE PHYSICAL EXAMINATION:  VITAL SIGNS:  The patient has stable  vital signs.  NECK:  Supple.  CHEST:  Clear to auscultation.  HEART:  S1 and regular.  ABDOMEN:  Soft with normal bowel sounds.   DESCRIPTION OF THE PROCEDURE:  The patient was placed in the left lateral  decubitus position, and sedated with 90 mg of Demerol and 9 mg of Versed  intravenously.  Once the patient was adequately sedated and maintained on  low-flow oxygen and continuous cardiac monitoring the Olympus panendoscope  was advanced through the mouthpiece, over the tongue and into the esophagus  under direct vision.   The entire esophagus appeared normal with no evidence of ring, stricture,  masses, esophagus, or Barrett's mucosa.   The scope was then advanced into the stomach.  A small hiatal hernia was  seen on high retroflexion.  The rest of the gastric mucosa and the proximal  small bowel appeared normal.   IMPRESSION:  Normal esophagogastroduodenoscopy, except for a hiatal hernia.   RECOMMENDATIONS:  1. Continue PPI's.  2.     Proceed with a  colonoscopy at this time.  3. Avoid all nonsteroidals including aspirin.  4. Outpatient follow up in the next couple of weeks.                                                 Nelwyn Salisbury, M.D.    JNM/MEDQ  D:  02/21/2002  T:  02/22/2002  Job:  [id number]   cc:   Lulu Riding, M.D.  [address]  [place], [place] [address]  Fax: [phone]

## 2010-07-11 ENCOUNTER — Emergency Department (HOSPITAL_COMMUNITY): Payer: BC Managed Care – PPO

## 2010-07-11 ENCOUNTER — Encounter (HOSPITAL_COMMUNITY): Payer: Self-pay | Admitting: Radiology

## 2010-07-11 ENCOUNTER — Inpatient Hospital Stay (HOSPITAL_COMMUNITY)
Admission: EM | Admit: 2010-07-11 | Discharge: 2010-07-19 | DRG: 544 | Disposition: A | Discharge status: Home Health | Payer: BC Managed Care – PPO | Attending: Internal Medicine | Admitting: Internal Medicine

## 2010-07-11 DIAGNOSIS — J441 Chronic obstructive pulmonary disease with (acute) exacerbation: Secondary | ICD-10-CM | POA: Diagnosis present

## 2010-07-11 DIAGNOSIS — Z79899 Other long term (current) drug therapy: Secondary | ICD-10-CM

## 2010-07-11 DIAGNOSIS — R0902 Hypoxemia: Secondary | ICD-10-CM | POA: Diagnosis present

## 2010-07-11 DIAGNOSIS — Z91199 Patient's noncompliance with other medical treatment and regimen due to unspecified reason: Secondary | ICD-10-CM

## 2010-07-11 DIAGNOSIS — I2699 Other pulmonary embolism without acute cor pulmonale: Secondary | ICD-10-CM | POA: Diagnosis present

## 2010-07-11 DIAGNOSIS — I4891 Unspecified atrial fibrillation: Principal | ICD-10-CM | POA: Diagnosis present

## 2010-07-11 DIAGNOSIS — I10 Essential (primary) hypertension: Secondary | ICD-10-CM | POA: Diagnosis present

## 2010-07-11 DIAGNOSIS — E7800 Pure hypercholesterolemia, unspecified: Secondary | ICD-10-CM | POA: Diagnosis present

## 2010-07-11 DIAGNOSIS — G4733 Obstructive sleep apnea (adult) (pediatric): Secondary | ICD-10-CM | POA: Diagnosis present

## 2010-07-11 DIAGNOSIS — Z87891 Personal history of nicotine dependence: Secondary | ICD-10-CM

## 2010-07-11 DIAGNOSIS — E119 Type 2 diabetes mellitus without complications: Secondary | ICD-10-CM | POA: Diagnosis present

## 2010-07-11 DIAGNOSIS — Z9119 Patient's noncompliance with other medical treatment and regimen: Secondary | ICD-10-CM

## 2010-07-11 DIAGNOSIS — I5033 Acute on chronic diastolic (congestive) heart failure: Secondary | ICD-10-CM | POA: Diagnosis present

## 2010-07-11 DIAGNOSIS — IMO0002 Reserved for concepts with insufficient information to code with codable children: Secondary | ICD-10-CM

## 2010-07-11 DIAGNOSIS — G43909 Migraine, unspecified, not intractable, without status migrainosus: Secondary | ICD-10-CM | POA: Diagnosis present

## 2010-07-11 DIAGNOSIS — R0602 Shortness of breath: Secondary | ICD-10-CM

## 2010-07-11 DIAGNOSIS — Z882 Allergy status to sulfonamides status: Secondary | ICD-10-CM

## 2010-07-11 DIAGNOSIS — Z7901 Long term (current) use of anticoagulants: Secondary | ICD-10-CM

## 2010-07-11 DIAGNOSIS — K219 Gastro-esophageal reflux disease without esophagitis: Secondary | ICD-10-CM | POA: Diagnosis present

## 2010-07-11 DIAGNOSIS — I509 Heart failure, unspecified: Secondary | ICD-10-CM | POA: Diagnosis present

## 2010-07-11 DIAGNOSIS — F341 Dysthymic disorder: Secondary | ICD-10-CM | POA: Diagnosis present

## 2010-07-11 DIAGNOSIS — E785 Hyperlipidemia, unspecified: Secondary | ICD-10-CM | POA: Diagnosis present

## 2010-07-11 DIAGNOSIS — I4892 Unspecified atrial flutter: Secondary | ICD-10-CM

## 2010-07-11 LAB — BASIC METABOLIC PANEL
BUN: 12 mg/dL (ref 6–23)
CO2: 25 mEq/L (ref 19–32)
Calcium: 9.1 mg/dL (ref 8.4–10.5)
Chloride: 108 mEq/L (ref 96–112)
Creatinine, Ser: 0.9 mg/dL (ref 0.4–1.2)
GFR calc Af Amer: >60 mL/min (ref >60)
GFR calc non Af Amer: >60 mL/min (ref >60)
Glucose, Bld: 97 mg/dL (ref 70–99)
Potassium: 4 mEq/L (ref 3.5–5.1)
Sodium: 143 mEq/L (ref 135–145)

## 2010-07-11 LAB — DIFFERENTIAL
Basophils Absolute: 0 10*3/uL (ref 0.0–0.1)
Basophils Relative: 0 % (ref 0–1)
Eosinophils Absolute: 0.1 10*3/uL (ref 0.0–0.7)
Eosinophils Relative: 1 % (ref 0–5)
Lymphocytes Relative: 33 % (ref 12–46)
Lymphs Abs: 2.7 10*3/uL (ref 0.7–4.0)
Monocytes Absolute: 0.5 10*3/uL (ref 0.1–1.0)
Monocytes Relative: 6 % (ref 3–12)
Neutro Abs: 5 10*3/uL (ref 1.7–7.7)
Neutrophils Relative %: 61 % (ref 43–77)

## 2010-07-11 LAB — COMPREHENSIVE METABOLIC PANEL
ALT: 55 U/L — ABNORMAL HIGH (ref 0–35)
AST: 26 U/L (ref 0–37)
Albumin: 3.4 g/dL — ABNORMAL LOW (ref 3.5–5.2)
Alkaline Phosphatase: 71 U/L (ref 39–117)
BUN: 12 mg/dL (ref 6–23)
CO2: 26 mEq/L (ref 19–32)
Calcium: 8.4 mg/dL (ref 8.4–10.5)
Chloride: 108 mEq/L (ref 96–112)
Creatinine, Ser: 0.91 mg/dL (ref 0.4–1.2)
GFR calc Af Amer: >60 mL/min (ref >60)
GFR calc non Af Amer: >60 mL/min (ref >60)
Glucose, Bld: 110 mg/dL — ABNORMAL HIGH (ref 70–99)
Potassium: 4.3 mEq/L (ref 3.5–5.1)
Sodium: 144 mEq/L (ref 135–145)
Total Bilirubin: 0.5 mg/dL (ref 0.3–1.2)
Total Protein: 6.3 g/dL (ref 6.0–8.3)

## 2010-07-11 LAB — TROPONIN I: Troponin I: <0.3 ng/mL (ref <0.30)

## 2010-07-11 LAB — PRO B NATRIURETIC PEPTIDE: Pro B Natriuretic peptide (BNP): 1429 pg/mL — ABNORMAL HIGH (ref 0–125)

## 2010-07-11 LAB — CK TOTAL AND CKMB (NOT AT ARMC)
CK, MB: 5.8 ng/mL — ABNORMAL HIGH (ref 0.3–4.0)
Relative Index: 5.1 — ABNORMAL HIGH (ref 0.0–2.5)
Total CK: 113 U/L (ref 7–177)

## 2010-07-11 LAB — CBC
HCT: 41.7 % (ref 36.0–46.0)
Hemoglobin: 13.7 g/dL (ref 12.0–15.0)
MCH: 27.7 pg (ref 26.0–34.0)
MCHC: 32.9 g/dL (ref 30.0–36.0)
MCV: 84.2 fL (ref 78.0–100.0)
Platelets: 238 10*3/uL (ref 150–400)
RBC: 4.95 MIL/uL (ref 3.87–5.11)
RDW: 15.3 % (ref 11.5–15.5)
WBC: 8.2 10*3/uL (ref 4.0–10.5)

## 2010-07-11 LAB — PROTIME-INR
INR: 0.99 (ref 0.00–1.49)
Prothrombin Time: 13.3 seconds (ref 11.6–15.2)

## 2010-07-11 LAB — MAGNESIUM: Magnesium: 2 mg/dL (ref 1.5–2.5)

## 2010-07-11 LAB — APTT: aPTT: 25 seconds (ref 24–37)

## 2010-07-11 MED ORDER — IOHEXOL 300 MG/ML  SOLN
60.0000 mL | Freq: Once | INTRAMUSCULAR | Status: AC | PRN
  Administered 2010-07-11: 60 mL via INTRAVENOUS

## 2010-07-12 DIAGNOSIS — I4891 Unspecified atrial fibrillation: Secondary | ICD-10-CM

## 2010-07-12 LAB — CARDIAC PANEL(CRET KIN+CKTOT+MB+TROPI)
CK, MB: 5.2 ng/mL — ABNORMAL HIGH (ref 0.3–4.0)
CK, MB: 4.7 ng/mL — ABNORMAL HIGH (ref 0.3–4.0)
Relative Index: 5.1 — ABNORMAL HIGH (ref 0.0–2.5)
Relative Index: INVALID (ref 0.0–2.5)
Total CK: 102 U/L (ref 7–177)
Total CK: 95 U/L (ref 7–177)
Troponin I: <0.3 ng/mL (ref <0.30)
Troponin I: <0.3 ng/mL (ref <0.30)

## 2010-07-12 LAB — CBC
HCT: 37.2 % (ref 36.0–46.0)
Hemoglobin: 12.2 g/dL (ref 12.0–15.0)
MCH: 27.7 pg (ref 26.0–34.0)
MCHC: 32.8 g/dL (ref 30.0–36.0)
MCV: 84.5 fL (ref 78.0–100.0)
Platelets: 203 10*3/uL (ref 150–400)
RBC: 4.4 MIL/uL (ref 3.87–5.11)
RDW: 15.2 % (ref 11.5–15.5)
WBC: 7.7 10*3/uL (ref 4.0–10.5)

## 2010-07-12 LAB — GLUCOSE, CAPILLARY
Glucose-Capillary: 102 mg/dL — ABNORMAL HIGH (ref 70–99)
Glucose-Capillary: 115 mg/dL — ABNORMAL HIGH (ref 70–99)
Glucose-Capillary: 119 mg/dL — ABNORMAL HIGH (ref 70–99)
Glucose-Capillary: 125 mg/dL — ABNORMAL HIGH (ref 70–99)
Glucose-Capillary: 128 mg/dL — ABNORMAL HIGH (ref 70–99)
Glucose-Capillary: 140 mg/dL — ABNORMAL HIGH (ref 70–99)

## 2010-07-12 LAB — LIPID PANEL
Cholesterol: 191 mg/dL (ref 0–200)
HDL: 49 mg/dL (ref >39)
LDL Cholesterol: 106 mg/dL — ABNORMAL HIGH (ref 0–99)
Total CHOL/HDL Ratio: 3.9 RATIO
Triglycerides: 181 mg/dL — ABNORMAL HIGH (ref <150)
VLDL: 36 mg/dL (ref 0–40)

## 2010-07-12 LAB — HEMOGLOBIN A1C
Hgb A1c MFr Bld: 6.1 % — ABNORMAL HIGH (ref <5.7)
Mean Plasma Glucose: 128 mg/dL — ABNORMAL HIGH (ref <117)

## 2010-07-12 LAB — MRSA PCR SCREENING: MRSA by PCR: NEGATIVE

## 2010-07-12 LAB — TSH: TSH: 2.173 u[IU]/mL (ref 0.350–4.500)

## 2010-07-12 LAB — HEPARIN LEVEL (UNFRACTIONATED): Heparin Unfractionated: 0.54 IU/mL (ref 0.30–0.70)

## 2010-07-13 DIAGNOSIS — I4892 Unspecified atrial flutter: Secondary | ICD-10-CM

## 2010-07-13 LAB — BASIC METABOLIC PANEL
BUN: 11 mg/dL (ref 6–23)
CO2: 35 mEq/L — ABNORMAL HIGH (ref 19–32)
Calcium: 8.8 mg/dL (ref 8.4–10.5)
Chloride: 97 mEq/L (ref 96–112)
Creatinine, Ser: 1.03 mg/dL (ref 0.4–1.2)
GFR calc Af Amer: >60 mL/min (ref >60)
GFR calc non Af Amer: 56 mL/min — ABNORMAL LOW (ref >60)
Glucose, Bld: 114 mg/dL — ABNORMAL HIGH (ref 70–99)
Potassium: 3.8 mEq/L (ref 3.5–5.1)
Sodium: 140 mEq/L (ref 135–145)

## 2010-07-13 LAB — CBC
HCT: 40.9 % (ref 36.0–46.0)
Hemoglobin: 13.1 g/dL (ref 12.0–15.0)
MCH: 27.3 pg (ref 26.0–34.0)
MCHC: 32 g/dL (ref 30.0–36.0)
MCV: 85.2 fL (ref 78.0–100.0)
Platelets: 249 10*3/uL (ref 150–400)
RBC: 4.8 MIL/uL (ref 3.87–5.11)
RDW: 15.1 % (ref 11.5–15.5)
WBC: 8.7 10*3/uL (ref 4.0–10.5)

## 2010-07-13 LAB — GLUCOSE, CAPILLARY
Glucose-Capillary: 124 mg/dL — ABNORMAL HIGH (ref 70–99)
Glucose-Capillary: 131 mg/dL — ABNORMAL HIGH (ref 70–99)
Glucose-Capillary: 108 mg/dL — ABNORMAL HIGH (ref 70–99)
Glucose-Capillary: 135 mg/dL — ABNORMAL HIGH (ref 70–99)
Glucose-Capillary: 114 mg/dL — ABNORMAL HIGH (ref 70–99)

## 2010-07-13 LAB — PROTIME-INR
INR: 1.04 (ref 0.00–1.49)
Prothrombin Time: 13.8 seconds (ref 11.6–15.2)

## 2010-07-13 LAB — HEPARIN LEVEL (UNFRACTIONATED): Heparin Unfractionated: 0.33 IU/mL (ref 0.30–0.70)

## 2010-07-14 LAB — CBC
HCT: 39.2 % (ref 36.0–46.0)
Hemoglobin: 12.5 g/dL (ref 12.0–15.0)
MCH: 27.7 pg (ref 26.0–34.0)
MCHC: 31.9 g/dL (ref 30.0–36.0)
MCV: 86.7 fL (ref 78.0–100.0)
Platelets: 210 10*3/uL (ref 150–400)
RBC: 4.52 MIL/uL (ref 3.87–5.11)
RDW: 15.3 % (ref 11.5–15.5)
WBC: 7.7 10*3/uL (ref 4.0–10.5)

## 2010-07-14 LAB — HEPARIN LEVEL (UNFRACTIONATED): Heparin Unfractionated: 0.36 IU/mL (ref 0.30–0.70)

## 2010-07-14 LAB — GLUCOSE, CAPILLARY: Glucose-Capillary: 123 mg/dL — ABNORMAL HIGH (ref 70–99)

## 2010-07-14 LAB — PROTIME-INR
INR: 1.57 — ABNORMAL HIGH (ref 0.00–1.49)
Prothrombin Time: 19 seconds — ABNORMAL HIGH (ref 11.6–15.2)

## 2010-07-15 ENCOUNTER — Inpatient Hospital Stay (HOSPITAL_COMMUNITY): Payer: BC Managed Care – PPO

## 2010-07-15 LAB — PRO B NATRIURETIC PEPTIDE: Pro B Natriuretic peptide (BNP): 258.2 pg/mL — ABNORMAL HIGH (ref 0–125)

## 2010-07-15 LAB — CBC
HCT: 40.5 % (ref 36.0–46.0)
Hemoglobin: 12.9 g/dL (ref 12.0–15.0)
MCH: 27.5 pg (ref 26.0–34.0)
MCHC: 31.9 g/dL (ref 30.0–36.0)
MCV: 86.4 fL (ref 78.0–100.0)
Platelets: 226 10*3/uL (ref 150–400)
RBC: 4.69 MIL/uL (ref 3.87–5.11)
RDW: 15.2 % (ref 11.5–15.5)
WBC: 6.6 10*3/uL (ref 4.0–10.5)

## 2010-07-15 LAB — BASIC METABOLIC PANEL
BUN: 16 mg/dL (ref 6–23)
CO2: 37 mEq/L — ABNORMAL HIGH (ref 19–32)
Calcium: 8.6 mg/dL (ref 8.4–10.5)
Chloride: 95 mEq/L — ABNORMAL LOW (ref 96–112)
Creatinine, Ser: 0.81 mg/dL (ref 0.4–1.2)
GFR calc Af Amer: >60 mL/min (ref >60)
GFR calc non Af Amer: >60 mL/min (ref >60)
Glucose, Bld: 118 mg/dL — ABNORMAL HIGH (ref 70–99)
Potassium: 3.6 mEq/L (ref 3.5–5.1)
Sodium: 140 mEq/L (ref 135–145)

## 2010-07-15 LAB — GLUCOSE, CAPILLARY
Glucose-Capillary: 116 mg/dL — ABNORMAL HIGH (ref 70–99)
Glucose-Capillary: 116 mg/dL — ABNORMAL HIGH (ref 70–99)
Glucose-Capillary: 114 mg/dL — ABNORMAL HIGH (ref 70–99)
Glucose-Capillary: 102 mg/dL — ABNORMAL HIGH (ref 70–99)
Glucose-Capillary: 129 mg/dL — ABNORMAL HIGH (ref 70–99)
Glucose-Capillary: 165 mg/dL — ABNORMAL HIGH (ref 70–99)
Glucose-Capillary: 128 mg/dL — ABNORMAL HIGH (ref 70–99)

## 2010-07-15 LAB — PROTIME-INR
INR: 1.8 — ABNORMAL HIGH (ref 0.00–1.49)
Prothrombin Time: 21.1 seconds — ABNORMAL HIGH (ref 11.6–15.2)

## 2010-07-15 LAB — HEPARIN LEVEL (UNFRACTIONATED): Heparin Unfractionated: 0.43 IU/mL (ref 0.30–0.70)

## 2010-07-15 NOTE — H&P (Signed)
[name], [name] NO.:  [id number]  MEDICAL RECORD NO.:  [id number]           PATIENT TYPE:  E  LOCATION:  [facility]                         FACILITY:  [facility]  PHYSICIAN:  Orson Slick, MD     DATE OF BIRTH:  11-Jul-1960  DATE OF ADMISSION:  07/11/2010 DATE OF DISCHARGE:                             HISTORY & PHYSICAL   REASON FOR ADMISSION:  Atrial flutter with RVR and shortness of breath.  HISTORY OF PRESENT ILLNESS:  [name] is a 50 year old white female with a past medical history as below, including history of paroxysmal atrial fibrillation previously treated with flecainide, beta-blockers, and Coumadin.  The patient stopped taking her medications several years ago due to financial reasons.  The patient states that for the last several months she has had intermittent episodes of dyspnea and has been treated with multiple rounds of prednisone by her primary care physician.  Approximately 3 weeks ago, she is treated with another round of prednisone and a Z-Pak but she states she never had any wheezing at that time.  Despite this, her dyspnea has continued for the previous 3 weeks.  Earlier today, she began to have a headache and the dyspnea became much more severe.  In addition to this, she became very nauseated.  The patient called EMS and was found to be in a narrow complex tachycardia with a heart rate of 200.  She was brought to the emergency department where she was treated with IV adenosine x2 with a transient sinus rhythm with no true pause noted on the rhythm strip with the administration of adenosine.  The patient roughly went back into her narrow complex tachycardia after that.  The patient was subsequently treated with IV diltiazem with conversion to sinus rhythm.  Currently, the patient's heart rate is 50-70 with intermittent atrial flutter with variable conduction and normal sinus rhythm on the telemetry.  The patient remained severely  dyspneic and nauseated even when she is in sinus rhythm.  She does state she has also had orthopnea for the past several months but she denies PND, syncope, chest pain, palpitations or peripheral edema at any time.  PAST MEDICAL HISTORY: 1. Paroxysmal atrial fibrillation, previously managed with flecainide,     beta-blockers, and Coumadin.  They are off medications for several     years. 2. Obesity. 3. Hypertension. 4. Hyperlipidemia. 5. Obstructive sleep apnea, not on CPAP. 6. Borderline diabetes. 7. Anxiety and depression. 8. Questionable COPD. 9. GERD.  ALLERGIES:  SULFA.  MEDICATIONS:  Aspirin 81 mg daily.  SOCIAL HISTORY:  Separated or divorced from her husband.  Four children. Quit tobacco last August.  No alcohol or drugs.  FAMILY HISTORY:  Mother died of colon cancer in the 81s.  Her father is alive and well.  REVIEW OF SYSTEMS:  Negative for fever, urinary complaints, vomiting or diarrhea.  All other systems are negative except as described in HPI.  PHYSICAL EXAMINATION:  VITAL SIGNS:  Afebrile, pulse 59, respiratoryrate 22, blood pressure 99/64, O2 sat is 97% on 2 L nasal cannula. GENERAL:  The patient is an obese white female, alert and  oriented x3, dyspneic with speech and unable to complete a full sentence. HEENT:  She is normocephalic, atraumatic.  Extraocular movements are intact.  Mucous membranes are moist. NECK:  Supple without lymphadenopathy.  JVD is difficult to assess due to habitus. CARDIOVASCULAR:  Regular rate and rhythm with normal S1 and S2 without murmurs, gallops, or rubs. LUNGS:  Clear to auscultation bilaterally. SKIN:  Warm and dry. ABDOMEN:  Benign. EXTREMITIES:  Bilateral lower extremity edema 1+. MUSCULOSKELETAL:  No bony deformity or effusion. NEURO:  Alert and oriented x3.  Cranial nerves II-XII intact.  RADIOLOGY:  Chest x-ray has mild pulmonary vascular congestion with mild cephalization.  ELECTROCARDIOGRAM:  Initial EKG by EMS  showed a narrow complex tachycardia with a rate of 199 that is an SVT versus atrial flutter.  At present, the patient's telemetry shows sinus brady with a heart rate in the 50s intermittently intermittent with atrial flutter with variable conduction with a rate in the 70s to 80s.  Both of these rhythms occurred intermittently during my exam.  LABORATORY DATA:  White count is 8.3 with normal differential. Hemoglobin 13.7, hematocrit 41.7, platelets 238.  Sodium 143, potassium 4, chloride 108, bicarb 25, BUN 12, creatinine 0.9, glucose 97, calcium 9.1.  Cardiac enzymes show CK-MB of 5.1, CPK 113 and troponin less than 0.3.  IMPRESSION AND PLAN: 1. Atrial flutter.  The patient was previously treated with a beta-     blocker, flecainide, and Coumadin for paroxysmal atrial     fibrillation, though she stopped these meds several years ago due     to financial reasons.  It is unclear if the patient's cardiac     arrhythmia is truly symptomatic as her presenting complaints,     specifically dyspnea and nausea and headache are still present even     when she is in normal sinus rhythm.  The patient was initially     treated with adenosine that showed transient sinus beats with rapid     recurrence of the narrow complex tachycardia.  The patient did slow     to atrial flutter on diltiazem IV, and is currently having     intermittent atrial flutter with variable conduction with a heart     rate of 70-80 interspersed with a sinus bradycardia.  We will     continue the patient on diltiazem for now.  In addition to that,     we will start heparin.  We will consider placing the patient on her     previous medication regimen in the morning. 2. Dyspnea.  The patient has mild peripheral edema of her bilateral     lower extremities, clear lungs, and no clearly evident JVD though     her habitus does limit evaluation.  Her chest x-ray shows mild     pulmonary vascular congestion.  The patient's severe  dyspnea out of     proportion to her physical exam along with her presenting     tachycardia are concerning for pulmonary embolism.  A CTA is     pending in the emergency department.  In addition to this, we will     evaluate the patient's dyspnea with an echocardiogram and a BNP     this admission. 3. Hypertension.  The patient has been off meds.  Her blood pressure     was borderline initially though she is normotensive on diltiazem     for now.  We will restart her previous medication regimen p.r.n. 4. Diabetes.  The patient has a history of borderline diabetes and her     blood sugar is normal at present.  We will obtain a hemoglobin A1c     and place the patient on sliding scale insulin. 5. Hyperlipidemia.  Fasting lipid panel is pending.     Orson Slick, MD     PS/MEDQ  D:  07/11/2010  T:  07/11/2010  Job:  [id number]  Electronically Signed by Orson Slick M.D. on 07/15/2010 08:28:13 PM

## 2010-07-16 DIAGNOSIS — J449 Chronic obstructive pulmonary disease, unspecified: Secondary | ICD-10-CM

## 2010-07-16 DIAGNOSIS — J988 Other specified respiratory disorders: Secondary | ICD-10-CM

## 2010-07-16 LAB — BASIC METABOLIC PANEL
BUN: 20 mg/dL (ref 6–23)
CO2: 36 mEq/L — ABNORMAL HIGH (ref 19–32)
Calcium: 8.9 mg/dL (ref 8.4–10.5)
Chloride: 94 mEq/L — ABNORMAL LOW (ref 96–112)
Creatinine, Ser: 0.84 mg/dL (ref 0.4–1.2)
GFR calc Af Amer: >60 mL/min (ref >60)
GFR calc non Af Amer: >60 mL/min (ref >60)
Glucose, Bld: 131 mg/dL — ABNORMAL HIGH (ref 70–99)
Potassium: 4.4 mEq/L (ref 3.5–5.1)
Sodium: 139 mEq/L (ref 135–145)

## 2010-07-16 LAB — GLUCOSE, CAPILLARY
Glucose-Capillary: 146 mg/dL — ABNORMAL HIGH (ref 70–99)
Glucose-Capillary: 121 mg/dL — ABNORMAL HIGH (ref 70–99)
Glucose-Capillary: 134 mg/dL — ABNORMAL HIGH (ref 70–99)
Glucose-Capillary: 177 mg/dL — ABNORMAL HIGH (ref 70–99)
Glucose-Capillary: 143 mg/dL — ABNORMAL HIGH (ref 70–99)

## 2010-07-16 LAB — CBC
HCT: 40.9 % (ref 36.0–46.0)
Hemoglobin: 13 g/dL (ref 12.0–15.0)
MCH: 27.4 pg (ref 26.0–34.0)
MCHC: 31.8 g/dL (ref 30.0–36.0)
MCV: 86.1 fL (ref 78.0–100.0)
Platelets: 252 10*3/uL (ref 150–400)
RBC: 4.75 MIL/uL (ref 3.87–5.11)
RDW: 15 % (ref 11.5–15.5)
WBC: 6.9 10*3/uL (ref 4.0–10.5)

## 2010-07-16 LAB — HEPARIN LEVEL (UNFRACTIONATED): Heparin Unfractionated: 0.46 IU/mL (ref 0.30–0.70)

## 2010-07-16 LAB — PROTIME-INR
INR: 2.09 — ABNORMAL HIGH (ref 0.00–1.49)
Prothrombin Time: 23.6 seconds — ABNORMAL HIGH (ref 11.6–15.2)

## 2010-07-17 DIAGNOSIS — G4730 Sleep apnea, unspecified: Secondary | ICD-10-CM

## 2010-07-17 DIAGNOSIS — G4710 Hypersomnia, unspecified: Secondary | ICD-10-CM

## 2010-07-17 DIAGNOSIS — J441 Chronic obstructive pulmonary disease with (acute) exacerbation: Secondary | ICD-10-CM

## 2010-07-17 DIAGNOSIS — I2699 Other pulmonary embolism without acute cor pulmonale: Secondary | ICD-10-CM

## 2010-07-17 LAB — CBC
HCT: 43.5 % (ref 36.0–46.0)
Hemoglobin: 13.7 g/dL (ref 12.0–15.0)
MCH: 27.2 pg (ref 26.0–34.0)
MCHC: 31.5 g/dL (ref 30.0–36.0)
MCV: 86.3 fL (ref 78.0–100.0)
Platelets: 295 10*3/uL (ref 150–400)
RBC: 5.04 MIL/uL (ref 3.87–5.11)
RDW: 15.5 % (ref 11.5–15.5)
WBC: 9.2 10*3/uL (ref 4.0–10.5)

## 2010-07-17 LAB — BASIC METABOLIC PANEL
BUN: 23 mg/dL (ref 6–23)
CO2: 36 mEq/L — ABNORMAL HIGH (ref 19–32)
Calcium: 9.6 mg/dL (ref 8.4–10.5)
Chloride: 95 mEq/L — ABNORMAL LOW (ref 96–112)
Creatinine, Ser: 0.91 mg/dL (ref 0.4–1.2)
GFR calc Af Amer: >60 mL/min (ref >60)
GFR calc non Af Amer: >60 mL/min (ref >60)
Glucose, Bld: 111 mg/dL — ABNORMAL HIGH (ref 70–99)
Potassium: 3.9 mEq/L (ref 3.5–5.1)
Sodium: 140 mEq/L (ref 135–145)

## 2010-07-17 LAB — GLUCOSE, CAPILLARY
Glucose-Capillary: 113 mg/dL — ABNORMAL HIGH (ref 70–99)
Glucose-Capillary: 116 mg/dL — ABNORMAL HIGH (ref 70–99)
Glucose-Capillary: 160 mg/dL — ABNORMAL HIGH (ref 70–99)
Glucose-Capillary: 131 mg/dL — ABNORMAL HIGH (ref 70–99)

## 2010-07-17 LAB — PROTIME-INR
INR: 2.14 — ABNORMAL HIGH (ref 0.00–1.49)
Prothrombin Time: 24.1 seconds — ABNORMAL HIGH (ref 11.6–15.2)

## 2010-07-17 LAB — HEPARIN LEVEL (UNFRACTIONATED): Heparin Unfractionated: 0.68 IU/mL (ref 0.30–0.70)

## 2010-07-18 LAB — GLUCOSE, CAPILLARY
Glucose-Capillary: 75 mg/dL (ref 70–99)
Glucose-Capillary: 100 mg/dL — ABNORMAL HIGH (ref 70–99)
Glucose-Capillary: 157 mg/dL — ABNORMAL HIGH (ref 70–99)

## 2010-07-18 LAB — PROTIME-INR
INR: 2.3 — ABNORMAL HIGH (ref 0.00–1.49)
Prothrombin Time: 25.4 seconds — ABNORMAL HIGH (ref 11.6–15.2)

## 2010-07-18 LAB — BASIC METABOLIC PANEL
BUN: 26 mg/dL — ABNORMAL HIGH (ref 6–23)
CO2: 36 mEq/L — ABNORMAL HIGH (ref 19–32)
Calcium: 9.2 mg/dL (ref 8.4–10.5)
Chloride: 94 mEq/L — ABNORMAL LOW (ref 96–112)
Creatinine, Ser: 0.99 mg/dL (ref 0.4–1.2)
GFR calc Af Amer: >60 mL/min (ref >60)
GFR calc non Af Amer: 59 mL/min — ABNORMAL LOW (ref >60)
Glucose, Bld: 101 mg/dL — ABNORMAL HIGH (ref 70–99)
Potassium: 4 mEq/L (ref 3.5–5.1)
Sodium: 139 mEq/L (ref 135–145)

## 2010-07-19 LAB — BASIC METABOLIC PANEL
BUN: 31 mg/dL — ABNORMAL HIGH (ref 6–23)
CO2: 35 mEq/L — ABNORMAL HIGH (ref 19–32)
Calcium: 9.7 mg/dL (ref 8.4–10.5)
Chloride: 95 mEq/L — ABNORMAL LOW (ref 96–112)
Creatinine, Ser: 1.24 mg/dL — ABNORMAL HIGH (ref 0.4–1.2)
GFR calc Af Amer: 55 mL/min — ABNORMAL LOW (ref >60)
GFR calc non Af Amer: 46 mL/min — ABNORMAL LOW (ref >60)
Glucose, Bld: 97 mg/dL (ref 70–99)
Potassium: 4.3 mEq/L (ref 3.5–5.1)
Sodium: 139 mEq/L (ref 135–145)

## 2010-07-19 LAB — GLUCOSE, CAPILLARY
Glucose-Capillary: 117 mg/dL — ABNORMAL HIGH (ref 70–99)
Glucose-Capillary: 96 mg/dL (ref 70–99)
Glucose-Capillary: 141 mg/dL — ABNORMAL HIGH (ref 70–99)

## 2010-07-19 LAB — PRO B NATRIURETIC PEPTIDE: Pro B Natriuretic peptide (BNP): 71.1 pg/mL (ref 0–125)

## 2010-07-19 LAB — PROTIME-INR
INR: 1.86 — ABNORMAL HIGH (ref 0.00–1.49)
Prothrombin Time: 21.6 seconds — ABNORMAL HIGH (ref 11.6–15.2)

## 2010-07-20 NOTE — Consult Note (Signed)
[name], [name]:  [id number]  MEDICAL RECORD NO.:  [id number]           PATIENT TYPE:  I  LOCATION:  2917                         FACILITY:  [facility]  PHYSICIAN:  Edythe Lynn, M.D.       DATE OF BIRTH:  07/10/1960  DATE OF CONSULTATION:  07/12/2010 DATE OF DISCHARGE:                                CONSULTATION   PRIMARY CARE PHYSICIAN:  Darcus Austin, DO  REFERRING PHYSICIAN:  Shaune Pascal. Bensimhon, MD  REASON FOR CONSULTATION:  Headaches and nausea.  HISTORY OF PRESENT ILLNESS:  [name] is a 50 year old lady admitted to hospital with an episode of atrial flutter/atrial fibrillation with ventricular response, COPD exacerbation who has been on the Cardiology Service overnight.  This morning when Dr. Haroldine Laws was rounding on her, she was complaining bitterly of a headache.  Apparently she gets migraine headaches frequently.  She comes to the emergency room where she gets intravenous narcotics.  Despite the recurrent dose of intravenous narcotics, she remained with headache and we were called to help with the consultation.  The patient reported she has had migraine headaches since she was young.  She has tried Imitrex, but that caused palpitations, so she stopped.  She has not been on any migraine prevention medications as far as she knows.  She currently reports her headache is 10/10 with pulsating features with photophobia and nausea, classical migraine type in picture.  She reports some minimal-to- moderate caffeine intake.  PAST MEDICAL HISTORY:  Atrial fibrillation, COPD, obesity, hypertension, hyperlipidemia, obstructive sleep apnea, anxiety and depression, gastroesophageal reflux disease.  ALLERGIES:  SULFA DRUGS.  SOCIAL HISTORY:  The patient lives with her 2 sons.  She is separated. Does not smoke cigarettes.  Does not use any drugs.  FAMILY HISTORY:  Mother died of colon cancer in her 58s.  Father is alive and well.  REVIEW OF  SYSTEMS:  As per HPI, otherwise positive for palpitations and minimal dyspnea.  No chest pain.  Positive for some coughing.  Otherwise per HPI or negative.  PHYSICAL EXAM:  VITAL SIGNS:  Temperature 98.3, heart rate 83, blood pressure 140/80, saturation 94% on 2 liters. GENERAL:  The patient is alert and oriented, minimal distress. HEENT:  Head, normocephalic, atraumatic.  Eyes, pupils equal, round, and reactive to light.  Extraocular muscles are intact.  Throat clear.  Face congested. NECK:  Bilateral JVD. CHEST:  Bilateral wheezes. HEART:  Irregularly irregular, tachycardic.  No murmurs. ABDOMEN:  Soft, nontender. LOWER EXTREMITIES:  Without edema. SKIN:  Warm, dry.  LABORATORY DATA:  White blood cell count 8.3, hemoglobin 13.7, platelet count 238.  Sodium 143, BUN 12, creatinine 0.9, glucose 97.  IMPRESSION AND RECOMMENDATIONS:  Migraine attack on a patient with known migraine headaches.  We would recommend intravenous Depacon to see if this will abort the migraine headache.  We will use p.r.n. Fioricet and Reglan to control the nausea.  We will assess her response to this treatment and adjust as necessary.     Edythe Lynn, M.D.     SL/MEDQ  D:  07/12/2010  T:  07/12/2010  Job:  [id number]  cc:   Shaune Pascal. Bensimhon, MD  Electronically Signed by Edythe Lynn M.D. on 07/20/2010 09:50:48 AM

## 2010-07-23 ENCOUNTER — Telehealth: Payer: Self-pay | Admitting: Internal Medicine

## 2010-07-23 NOTE — Telephone Encounter (Signed)
They have a question about a Rx that was written by Ignacia Bayley PA from the hospital they are not sure what the medication is and they need clarification on directions as well

## 2010-07-23 NOTE — Telephone Encounter (Signed)
Clarified prescription.

## 2010-08-04 ENCOUNTER — Encounter: Payer: Self-pay | Admitting: Internal Medicine

## 2010-08-04 ENCOUNTER — Encounter: Payer: Self-pay | Admitting: Pulmonary Disease

## 2010-08-05 ENCOUNTER — Ambulatory Visit (INDEPENDENT_AMBULATORY_CARE_PROVIDER_SITE_OTHER): Payer: BC Managed Care – PPO | Admitting: Pulmonary Disease

## 2010-08-05 ENCOUNTER — Encounter: Payer: Self-pay | Admitting: *Deleted

## 2010-08-05 ENCOUNTER — Other Ambulatory Visit: Payer: BC Managed Care – PPO

## 2010-08-05 ENCOUNTER — Ambulatory Visit (INDEPENDENT_AMBULATORY_CARE_PROVIDER_SITE_OTHER): Payer: BC Managed Care – PPO | Admitting: Physician Assistant

## 2010-08-05 ENCOUNTER — Encounter: Payer: Self-pay | Admitting: Pulmonary Disease

## 2010-08-05 ENCOUNTER — Encounter: Payer: Self-pay | Admitting: Physician Assistant

## 2010-08-05 ENCOUNTER — Ambulatory Visit (INDEPENDENT_AMBULATORY_CARE_PROVIDER_SITE_OTHER): Payer: BC Managed Care – PPO | Admitting: *Deleted

## 2010-08-05 VITALS — BP 104/80 | HR 103 | Temp 98.2°F | Ht 59.5 in | Wt 220.6 lb

## 2010-08-05 VITALS — BP 114/78 | HR 81 | Ht 59.5 in | Wt 217.8 lb

## 2010-08-05 DIAGNOSIS — Z7901 Long term (current) use of anticoagulants: Secondary | ICD-10-CM

## 2010-08-05 DIAGNOSIS — I5032 Chronic diastolic (congestive) heart failure: Secondary | ICD-10-CM

## 2010-08-05 DIAGNOSIS — G4733 Obstructive sleep apnea (adult) (pediatric): Secondary | ICD-10-CM

## 2010-08-05 DIAGNOSIS — I509 Heart failure, unspecified: Secondary | ICD-10-CM

## 2010-08-05 DIAGNOSIS — I480 Paroxysmal atrial fibrillation: Secondary | ICD-10-CM

## 2010-08-05 DIAGNOSIS — J449 Chronic obstructive pulmonary disease, unspecified: Secondary | ICD-10-CM

## 2010-08-05 DIAGNOSIS — I4892 Unspecified atrial flutter: Secondary | ICD-10-CM | POA: Insufficient documentation

## 2010-08-05 DIAGNOSIS — Z86711 Personal history of pulmonary embolism: Secondary | ICD-10-CM | POA: Insufficient documentation

## 2010-08-05 DIAGNOSIS — I5033 Acute on chronic diastolic (congestive) heart failure: Secondary | ICD-10-CM | POA: Insufficient documentation

## 2010-08-05 DIAGNOSIS — I10 Essential (primary) hypertension: Secondary | ICD-10-CM

## 2010-08-05 DIAGNOSIS — I4891 Unspecified atrial fibrillation: Secondary | ICD-10-CM

## 2010-08-05 DIAGNOSIS — I2699 Other pulmonary embolism without acute cor pulmonale: Secondary | ICD-10-CM

## 2010-08-05 LAB — BASIC METABOLIC PANEL
BUN: 16 mg/dL (ref 6–23)
CO2: 29 mEq/L (ref 19–32)
Calcium: 8.9 mg/dL (ref 8.4–10.5)
Chloride: 106 mEq/L (ref 96–112)
Creatinine, Ser: 0.9 mg/dL (ref 0.4–1.2)
GFR: 71.82 mL/min (ref >60.00)
Glucose, Bld: 99 mg/dL (ref 70–99)
Potassium: 3.7 mEq/L (ref 3.5–5.1)
Sodium: 143 mEq/L (ref 135–145)

## 2010-08-05 LAB — POCT INR: INR: >8

## 2010-08-05 NOTE — Progress Notes (Signed)
History of Present Illness: PCP:  Kelby Aline, PA-C (at Dr. Idell Pickles office) Primary Cardiologist:  Dr. [name]  [name] is a 50 y.o. female with a history of hypertension, hyperlipidemia, sleep apnea, glucose intolerance, COPD and GERD as well as paroxysmal atrial fibrillation.  She had done well on flecainide in the past.  She failed Tikosyn due to prolonged QT.  She had not seen Dr. Haroldine Laws since 2009.  She presents for posthospitalization followup.    She was admitted 6/2-6/10.  Prior to admission, she had stopped all of her medications due to difficulties with finances.  She had been developing increasing dyspnea requiring several rounds of antibiotics and prednisone taper.  She presented to the hospital with narrow complex tachycardia treated with adenosine x2.  She developed normal sinus rhythm briefly and then converted to atrial flutter with rapid ventricular rate.  Myocardial infarction was ruled out.  She continued to have intermittent episodes of atrial flutter.  She was placed on flecainide.  She was seen by electrophysiology.  Given her multiple atrial arrhythmias, ablation therapy was not felt to be helpful and she was continued on flecainide.  She had continued dyspnea and CT scan demonstrated a right lower lobe nonocclusive pulmonary embolism.  She was placed on heparin and Coumadin.  She was also seen by internal medicine for headaches which resolved with Fioricet.  She developed volume overload and was diuresed for acute on chronic diastolic heart failure.  Pulmonary saw the patient and felt that she had an acute on chronic COPD flare and placed on prednisone taper as well as adjusted her inhaled medications.  She has follow up today with pulmonary and will likely have a sleep test scheduled for sleep apnea.  She presents today for cardiology followup.  Doing well.  Breathing better.  No chest pain.  No orthopnea or PND.  No edema.  Has not had coumadin checked since  discharge.  Discussed importance of this as well as diet and bleeding precautions.  No palpitations.  ECG with NSR today.  She is only taking flecainide 150 mg QD.  Having some blurry vision.  No syncope.    Past Medical History  Diagnosis Date  . HTN (hypertension)   . Hyperlipidemia   . GERD (gastroesophageal reflux disease)   . Depression   . Anxiety   . Paroxysmal atrial fibrillation     Myoview 4/09: No ischemia;  echo 4/09: EF 60-65%;   Flecainide, beta blocker, Coumadin;    Unable to take Tikosyn 2/2 prolonged QT  . Obstructive sleep apnea   . Borderline diabetes   . Atrial flutter   . Chronic diastolic heart failure   . Pulmonary embolism     Right lower lobe diagnosed by CT 6/12    Current Outpatient Prescriptions  Medication Sig Dispense Refill  . ALPRAZolam (XANAX) 0.25 MG tablet Take 0.25 mg by mouth at bedtime as needed.        . diltiazem (CARDIZEM CD) 360 MG 24 hr capsule Take 360 mg by mouth daily.        . flecainide (TAMBOCOR) 50 MG tablet Take 2 tablets (100 mg total) by mouth 2 (two) times daily.      . fluticasone (VERAMYST) 27.5 MCG/SPRAY nasal spray Place 2 sprays into the nose daily.        . furosemide (LASIX) 40 MG tablet Take 40 mg by mouth daily.        . Levalbuterol Tartrate (XOPENEX HFA IN) Inhale into  the lungs as directed.        . loratadine (CLARITIN) 10 MG tablet Take 10 mg by mouth daily.        . pantoprazole (PROTONIX) 40 MG tablet Take 40 mg by mouth daily.        . potassium chloride (KLOR-CON) 20 MEQ packet Take 20 mEq by mouth daily.        . predniSONE (DELTASONE) 5 MG tablet Take 5 mg by mouth daily.        Marland Kitchen warfarin (COUMADIN) 5 MG tablet Take 5 mg by mouth as directed.        Marland Kitchen DISCONTD: aspirin-acetaminophen-caffeine (EXCEDRIN MIGRAINE) 250-250-65 MG per tablet Take 1 tablet by mouth as needed.        Marland Kitchen DISCONTD: chlorthalidone (HYGROTON) 25 MG tablet Take 25 mg by mouth daily.        Marland Kitchen DISCONTD: DULoxetine (CYMBALTA) 60 MG capsule  Take 60 mg by mouth daily.        Marland Kitchen DISCONTD: flecainide (TAMBOCOR) 50 MG tablet Take 150 mg by mouth daily.        Marland Kitchen DISCONTD: metoprolol (LOPRESSOR) 50 MG tablet Take 50 mg by mouth 2 (two) times daily. Take 1/2 tab       . DISCONTD: traZODone (DESYREL) 100 MG tablet Take 100 mg by mouth at bedtime.        Marland Kitchen DISCONTD: ALPRAZolam (XANAX) 0.5 MG tablet Take 0.5 mg by mouth daily as needed.        Marland Kitchen DISCONTD: warfarin (COUMADIN) 1 MG tablet Take 1 mg by mouth as directed.          Allergies: Allergies  Allergen Reactions  . Sulfonamide Derivatives    Social Hx:  No smoking.  Vital Signs: BP 114/78  Pulse 81  Ht 4' 11.5" (1.511 m)  Wt 217 lb 12.8 oz (98.793 kg)  BMI 43.25 kg/m2  PHYSICAL EXAM: Well nourished, well developed, in no acute distress HEENT: normal Neck: no JVD Cardiac:  normal S1, S2; RRR; no murmur Lungs:  Decreased breath sounds bilaterally, no wheezing, rhonchi or rales Abd: soft, nontender, no hepatomegaly Ext: no edema Skin: warm and dry Neuro:  CNs 2-12 intact, no focal abnormalities noted  EKG:   Sinus rhythm, heart rate 81, normal axis, nonspecific ST-T wave changes  ASSESSMENT AND PLAN:

## 2010-08-05 NOTE — Assessment & Plan Note (Signed)
She sees Dr. Gwenette Greet today.  Breathing improved.  No wheezing on exam.

## 2010-08-05 NOTE — Assessment & Plan Note (Signed)
She will need to remain on coumadin due to her Afib/flutter.  Again, discussed the importance of this with her and need for follow up.

## 2010-08-05 NOTE — Assessment & Plan Note (Signed)
She sees Dr. Gwenette Greet today.

## 2010-08-05 NOTE — Assessment & Plan Note (Signed)
Volume stable.  Check BMET today.

## 2010-08-05 NOTE — Patient Instructions (Signed)
Your physician recommends that you schedule a follow-up appointment in: 09/23/10 @ 12:30 with Dr. Haroldine Laws  You have been referred to RE-ESTB WITH CVRR DUE TO AFIB   Your physician recommends that you return for lab work in: BMET 428.32  Your physician has recommended you make the following change in your medication: FLECAINIDE 50 MG TAKE 2 TABLETS TWICE DAILY AS PER SCOTT WEAVER, PA-C.

## 2010-08-05 NOTE — Assessment & Plan Note (Signed)
Maintaining NSR on flecainide.  She is not taking it BID.  Having some blurry vision.  She was on 75 mg BID in the past.  Will have her change to 100 mg BID.  Continue coumadin.  Check INR today.  Discussed importance of coumadin follow up.  Follow up with Dr. Haroldine Laws in 2 months.

## 2010-08-05 NOTE — Assessment & Plan Note (Signed)
Controlled.  Continue current therapy.

## 2010-08-05 NOTE — Progress Notes (Signed)
  Subjective:    Patient ID: [name], female    DOB: [birth year], 50 y.o.   MRN: [id number]  HPI The pt is a 50y/o female who comes in today for evaluation of osa.  She was diagnosed with severe osa in 2007, with AHI 78/hr.  She was started on cpap with success, but stopped using in 2009 for various reasons.  She is now noting loud snoring, nocturia and restless sleep, and also nonrestorative sleep.  She notes significant EDS with inactivity, and has decreased concentration while at work.  Her weight is down only 11 pounds from her initial diagnosis.  Her epworth score today is 9.   Sleep Questionnaire: What time do you typically go to bed?( Between what hours) 10 pm to 12 am How long does it take you to fall asleep? 2 hours How many times during the night do you wake up? 4 What time do you get out of bed to start your day? 0630 Do you drive or operate heavy machinery in your occupation? No How much has your weight changed (up or down) over the past two years? (In pounds) Have you ever had a sleep study before? Yes If yes, location of study? [facility] If yes, date of study? 2008 Do you currently use CPAP? No Do you wear oxygen at any time? No     Review of Systems  Constitutional: Positive for unexpected weight change. Negative for fever.  HENT: Positive for congestion and sneezing. Negative for ear pain, nosebleeds, sore throat, rhinorrhea, trouble swallowing, dental problem, postnasal drip and sinus pressure.   Eyes: Negative for redness and itching.  Respiratory: Positive for cough and shortness of breath. Negative for chest tightness and wheezing.   Cardiovascular: Positive for chest pain and palpitations. Negative for leg swelling.  Gastrointestinal: Negative for nausea and vomiting.  Genitourinary: Negative for dysuria.  Musculoskeletal: Positive for joint swelling.  Skin: Negative for rash.  Neurological: Positive for headaches.  Hematological: Does not bruise/bleed easily.    Psychiatric/Behavioral: Positive for dysphoric mood. The patient is nervous/anxious.        Objective:   Physical Exam Constitutional:  Obese female, no acute distress  HENT:  Nares patent without discharge, septal deviation noted.   Oropharynx without exudate, palate and uvula are elongated.    Eyes:  Perrla, eomi, no scleral icterus  Neck:  No JVD, no TMG  Cardiovascular:  Normal rate, regular rhythm, no rubs or gallops.  No murmurs        Intact distal pulses  Pulmonary :  Normal breath sounds, no stridor or respiratory distress   No rales, rhonchi, or wheezing  Abdominal:  Soft, nondistended, bowel sounds present.  No tenderness noted.   Musculoskeletal:  mild lower extremity edema noted.  Lymph Nodes:  No cervical lymphadenopathy noted  Skin:  No cyanosis noted  Neurologic:  Alert, appropriate, moves all 4 extremities without obvious deficit.         Assessment & Plan:

## 2010-08-05 NOTE — Patient Instructions (Signed)
Will restart on cpap.  Need to optimize your pressure at home, and will let you know where your machine needs to be set. Work on weight loss Please call if having cpap tolerance.   followup with me in 4 weeks.

## 2010-08-06 LAB — PROTIME-INR
INR: >10 (ref <1.50)
Prothrombin Time: >90 seconds — ABNORMAL HIGH (ref 11.6–15.2)

## 2010-08-08 ENCOUNTER — Encounter: Payer: Self-pay | Admitting: Pulmonary Disease

## 2010-08-08 NOTE — Assessment & Plan Note (Signed)
The pt has a history of severe osa, and has gone without treatment since 2009.  She is very symptomatic both at night and during the day, and would like to go back to trying cpap again.  At this point, will start back on cpap, and also re-optimize her pressure for her.  I have encouraged her to work aggressively on weight reduction.

## 2010-08-10 ENCOUNTER — Ambulatory Visit (INDEPENDENT_AMBULATORY_CARE_PROVIDER_SITE_OTHER): Payer: BC Managed Care – PPO | Admitting: *Deleted

## 2010-08-10 DIAGNOSIS — I4892 Unspecified atrial flutter: Secondary | ICD-10-CM

## 2010-08-10 DIAGNOSIS — Z7901 Long term (current) use of anticoagulants: Secondary | ICD-10-CM

## 2010-08-10 LAB — POCT INR: INR: 1.6

## 2010-08-17 ENCOUNTER — Ambulatory Visit (INDEPENDENT_AMBULATORY_CARE_PROVIDER_SITE_OTHER): Payer: BC Managed Care – PPO | Admitting: *Deleted

## 2010-08-17 ENCOUNTER — Other Ambulatory Visit: Payer: Self-pay | Admitting: Nurse Practitioner

## 2010-08-17 DIAGNOSIS — I4892 Unspecified atrial flutter: Secondary | ICD-10-CM

## 2010-08-17 DIAGNOSIS — Z7901 Long term (current) use of anticoagulants: Secondary | ICD-10-CM

## 2010-08-17 LAB — POCT INR: INR: 1.8

## 2010-08-27 ENCOUNTER — Ambulatory Visit (INDEPENDENT_AMBULATORY_CARE_PROVIDER_SITE_OTHER): Payer: BC Managed Care – PPO | Admitting: *Deleted

## 2010-08-27 DIAGNOSIS — Z7901 Long term (current) use of anticoagulants: Secondary | ICD-10-CM

## 2010-08-27 DIAGNOSIS — I4892 Unspecified atrial flutter: Secondary | ICD-10-CM

## 2010-08-27 LAB — POCT INR: INR: 2.3

## 2010-08-27 NOTE — Consult Note (Signed)
  [name], Medina.:  [id number]  MEDICAL RECORD NO.:  [id number]  LOCATION:                                 FACILITY:  PHYSICIAN:  Champ Mungo. Lovena Le, MD    DATE OF BIRTH:  [birth year]  DATE OF CONSULTATION:  07/11/2010 DATE OF DISCHARGE:                                CONSULTATION   Consultation is requested by Dr. Haroldine Laws.  INDICATION FOR CONSULTATION:  Evaluation of atrial flutter/fibrillation and SVT.  HISTORY OF PRESENT ILLNESS:  The patient is a very pleasant 50 year old woman with a history of atrial fibrillation who has been treated with flecainide and beta-blockers in the past along with Coumadin.  She unfortunately stopped taking her medications.  She has some social problems.  She was admitted to the hospital with recurrent atrial arrhythmias and is referred now for additional evaluation.  The patient denies chest pain.  She does have dyspnea with her heart being out of rhythm.  PAST MEDICAL HISTORY:  Notable for hypertension, dyslipidemia, COPD, anxiety and depression, gastroesophageal reflux disease, and morbid obesity.  FAMILY HISTORY:  Notable for mother dying of colon cancer.  Father is alive and well.  SOCIAL HISTORY:  The patient is separated and divorced from her husband. She has four children.  She quit smoking almost a year ago.  No alcohol or drug abuse.  REVIEW OF SYSTEMS:  Notable for dyspnea, palpitations, weakness, and fatigue.  Otherwise, all systems negative except as noted in the HPI.  PHYSICAL EXAMINATION:  GENERAL:  She is a somewhat unkept-appearing middle-aged obese woman in no distress. VITAL SIGNS:  The blood pressure was 112/74, the pulse was 100 and irregular, respirations were 18, temperature was 98. HEENT:  Normocephalic and atraumatic.  Pupils equal and round.  The oropharynx moist.  Sclerae anicteric. NECK:  No jugular venous distention.  No thyromegaly.  Trachea is midline.  The carotids are 2+ and  symmetric. LUNGS:  Clear bilaterally to auscultation.  No wheezes, rales, or rhonchi. CARDIAC:  Irregular rhythm with normal S1 and S2.  The PMI was not enlarged or laterally displaced. ABDOMEN:  Soft, nontender.  There was no organomegaly.  Bowel sounds were present.  No rebound or guarding. EXTREMITIES:  No edema. NEUROLOGIC:  Alert and oriented x3.  Cranial nerves intact.  Strength was 5/5 and symmetric.  Her prior ECGs demonstrate SVT as well as atrial flutter.  IMPRESSION: 1. Recurrent atrial fibrillation. 2. Atrial flutter. 3. Supraventricular tachycardia. 4. Hypertension. 5. Dyslipidemia.  DISCUSSION:  As the patient has been fairly well controlled in the past on flecainide in conjunction with the beta-blocker, I think it would be most reasonable to start her on this medication.  I think catheter-based approach at controlling all of her arrhythmias at this point would be fairly unlikely.  However, if she had recurrent atrial arrhythmias on flecainide, then catheter ablation would certainly be a consideration.     Champ Mungo. Lovena Le, MD     GWT/MEDQ  D:  07/13/2010  T:  07/13/2010  Job:  [id number]  Electronically Signed by Cristopher Peru MD on 08/27/2010 08:32:27 AM

## 2010-09-07 NOTE — Discharge Summary (Signed)
[name], [name].:  [id number]  MEDICAL RECORD NO.:  [id number]  LOCATION:  2007                         FACILITY:  [facility]  PHYSICIAN:  Ludwig Lean. Doreatha Lew, M.D.DATE OF BIRTH:  07/27/1960  DATE OF ADMISSION:  07/11/2010 DATE OF DISCHARGE:  07/19/2010                              DISCHARGE SUMMARY   PRIMARY CARDIOLOGIST:  Shaune Pascal. Bensimhon, MD  PRIMARY CARE:  Dr. Si Gaul PULMONOLOGIST:  Kathee Delton, MD, [facility]  DISCHARGE DIAGNOSIS:  Atrial flutter with rapid ventricular response.  SECONDARY DIAGNOSES: 1. Paroxysmal atrial fibrillation. 2. Acute on chronic diastolic congestive heart failure. 3. Right lower lobe pulmonary embolus, acute. 4. Reinitiation of Coumadin anticoagulation. 5. Medication nonadherence related to finances in the past. 6. Hypertension. 7. Hyperlipidemia. 8. Obesity. 9. Obstructive sleep apnea with continuous positive airway pressure    noncompliance. 10.Borderline diabetes mellitus with a hemoglobin A1c of 6.1. 11.Anxiety. 12.Depression. 13.Acute on chronic obstructive pulmonary disease requiring pulmonary     consultation and prednisone taper. 14.Gastroesophageal reflux disease. 15.Migraine headaches.  ALLERGIES:  SULFA and TIKOSYN (prolonged QTc).  Also DILTIAZEM is listed as an intolerance, though the patient has been tolerating throughout admission.  PROCEDURES:  CT angio with contrast of the chest on July 11, 2010, revealing a solitary nonocclusive pulmonary embolus in the right lower lobe, medial basilar segmental branch.  HISTORY OF PRESENT ILLNESS:  This is a 50 year old female with prior history of paroxysmal atrial fibrillation, previously treated with flecainide, beta-blockers, and Coumadin who stopped taking her medications several years ago due to financial reasons.  Over a several- month period prior to admission, the patient began to experience intermittent bouts of dyspnea requiring  prednisone tapers and antibiotics.  Approximately 3 weeks ago, the patient was treated with prednisone taper and Z-Pak without any improvement in dyspnea.  On day of admission, the patient experienced a headache and dyspnea became much more severe.  EMS was called and the patient was found be in a narrow complex tachycardia with rate of 200.  She was taken to the [facility] ED where she was initially treated with IV adenosine x2 with transient sinus rhythm and no true pause noted on the rhythm strip.  The patient then resumed a narrow complex tachycardia.  She was placed on an IV diltiazem infusion with reduction in heart rate and evidence of atrial flutter.  Cardiology was called for admission.  HOSPITAL COURSE:  The patient ruled out for MI.  She continued to have intermittent atrial flutter and was placed back on flecainide therapy which was useful in the past for her atrial arrhythmias.  She was also placed on heparin and Coumadin.  CT angiography of the chest was performed due to her dyspnea with atrial arrhythmias and concern for possible PE and this did in fact show a solitary nonocclusive pulmonary embolus in the right lower lobe.  The patient continued to have paroxysms of atrial arrhythmias and was seen by Electrophysiology on July 13, 2010.  It was felt that catheter- based therapy would likely not address all of her atrial arrhythmias and therefore continuation of flecainide 100 mg q.12 h. was recommended. This was subsequently 250 mg q.12 h.  with reduced burden if atrial arrhythmias.  As stated above, the patient had headache on admission.  Internal Medicine was consulted for medical therapy with resolution of symptoms with Fioricet.  Despite improved rhythm management with initiation of flecainide, the patient continued to have dyspnea with wheezing.  She was felt to exhibit mild volume overload in the setting of presumed diastolic heart failure and was initially  treated with intravenous Lasix and this was subsequently switched to oral Lasix.  Volume status was felt to be improved.  Because of ongoing wheezing, Pulmonology was consulted and they recommended prednisone taper as well as initiation of inhaler therapy as outlined below in her discharge medications.  They have also recommended a followup sleep evaluation and the patient has an appointment to see Dr. Gwenette Greet on August 05, 2010.  The patient has been counseled on the importance of medication compliance.  As we have reinitiated Coumadin both for atrial arrhythmias and pulmonary embolus, we plan to have her follow up in our Coumadin Clinic later this week.  INR was greater than 2 in the past 3 days, although today dipped to 1.86.  DISCHARGE LABORATORY DATA:  Hemoglobin 13.7, hematocrit 43.5 WBC 9.2, and platelets 295.  INR 1.86.  Sodium 139, potassium 4.3, chloride 95, CO2 of 35, BUN 31, creatinine 1.24, glucose 97, total bilirubin 0.5, alkaline phosphatase 71, AST 26, ALT 55, total protein 6.3, albumin 3.4, calcium 9.7, magnesium 2.0.  Hemoglobin A1c 6.1.  CK 95, MB 4.7, and troponin I less than 0.30.  BNP 71 down from 1429 on admission.  Total cholesterol 91, triglycerides 181, HDL 29, and LDL 106.  TSH 2.173. MRSA screen was negative.  DISPOSITION:  The patient will be discharged to home today in good condition.  FOLLOWUP PLANS AND APPOINTMENTS:  We will arrange for followup at [facility] at end of the this week.  She will follow up with Dr. Haroldine Laws in approximately 2-3 weeks and with Dr. Gwenette Greet at [facility] on August 05, 2010, at 11:45 a.m.  DISCHARGE MEDICATIONS: 1. Alprazolam 0.25 mg nightly. 2. Coumadin 5 mg 2 tablets nightly. 3. Diltiazem CD 360 mg daily. 4. Flecainide 150 mg b.i.d. 5. Fluticasone 50 mcg nasal spray 2 sprays each nostril b.i.d. 6. Furosemide 40 mg daily. 7. Ipratropium/albuterol inhaler 18 mcg/90 mcg 2 puffs q.i.d. 8.  Albuterol 45 mcg inhaled 2 puffs q.4 h. p.r.n. 9. Loratadine 10 mg daily. 10.Protonix 40 mg b.i.d. 11.Potassium chloride 20 mEq daily. 12.Prednisone 5 mg 2 tablets on July 20, 2010, and July 21, 2010; 1     tablet on July 21, 2012, and July 23, 2010; then discontinue.  OUTSTANDING LABORATORY DATA AND STUDIES:  Followup INR later this week.  DURATION OF DISCHARGE ENCOUNTER:  60 minutes including physician.     Murray Hodgkins, ANP   ______________________________ Ludwig Lean. Doreatha Lew, M.D.    CB/MEDQ  D:  07/19/2010  T:  07/20/2010  Job:  [id number]  cc:   Kathee Delton, MD,FCCP Sabino Dick, MD  Electronically Signed by Murray Hodgkins ANP on 07/22/2010 90:30:09 PM Electronically Signed by Romeo Apple M.D. on 09/07/2010 11:16:48 AM

## 2010-09-09 ENCOUNTER — Ambulatory Visit: Payer: BC Managed Care – PPO | Admitting: Pulmonary Disease

## 2010-09-10 ENCOUNTER — Ambulatory Visit (INDEPENDENT_AMBULATORY_CARE_PROVIDER_SITE_OTHER): Payer: BC Managed Care – PPO | Admitting: *Deleted

## 2010-09-10 DIAGNOSIS — Z7901 Long term (current) use of anticoagulants: Secondary | ICD-10-CM

## 2010-09-10 DIAGNOSIS — I4892 Unspecified atrial flutter: Secondary | ICD-10-CM

## 2010-09-10 LAB — POCT INR: INR: 2.4

## 2010-09-11 ENCOUNTER — Other Ambulatory Visit: Payer: Self-pay | Admitting: *Deleted

## 2010-09-11 DIAGNOSIS — I4891 Unspecified atrial fibrillation: Secondary | ICD-10-CM

## 2010-09-11 MED ORDER — FUROSEMIDE 40 MG PO TABS: 40.0000 mg | ORAL_TABLET | Freq: Every day | ORAL | Status: DC

## 2010-09-11 MED ORDER — POTASSIUM CHLORIDE 20 MEQ PO PACK: 20.0000 meq | PACK | Freq: Every day | ORAL | Status: DC

## 2010-09-11 MED ORDER — DILTIAZEM HCL ER COATED BEADS 360 MG PO CP24: 360.0000 mg | ORAL_CAPSULE | Freq: Every day | ORAL | Status: DC

## 2010-09-11 MED ORDER — FLECAINIDE ACETATE 50 MG PO TABS: 100.0000 mg | ORAL_TABLET | Freq: Two times a day (BID) | ORAL | Status: DC

## 2010-09-16 ENCOUNTER — Other Ambulatory Visit: Payer: Self-pay | Admitting: Pharmacist

## 2010-09-16 MED ORDER — WARFARIN SODIUM 5 MG PO TABS: ORAL_TABLET | ORAL | Status: DC

## 2010-09-17 ENCOUNTER — Other Ambulatory Visit: Payer: Self-pay | Admitting: *Deleted

## 2010-09-17 MED ORDER — PANTOPRAZOLE SODIUM 40 MG PO TBEC: 40.0000 mg | DELAYED_RELEASE_TABLET | Freq: Every day | ORAL | Status: DC

## 2010-09-17 MED ORDER — FLUTICASONE FUROATE 27.5 MCG/SPRAY NA SUSP: 2.0000 | Freq: Every day | NASAL | Status: DC

## 2010-09-17 MED ORDER — ALPRAZOLAM 0.25 MG PO TABS: 0.2500 mg | ORAL_TABLET | Freq: Every evening | ORAL | Status: DC | PRN

## 2010-09-18 ENCOUNTER — Telehealth: Payer: Self-pay | Admitting: Internal Medicine

## 2010-09-18 NOTE — Telephone Encounter (Signed)
Ref# [id number] nasal spray need clarification drug interaction

## 2010-09-18 NOTE — Telephone Encounter (Signed)
Spoke with [facility]. Apparently patient was prescribed Veramyst nasal spray but there is no documentation as to which doctor prescribed this. There is a generic (Flonase) but is a different strength and they would need a new prescription. Since I do not see any documentation on who initially prescribed this, I have asked the patient to call us back. This was initially refilled under Dr. Clayborne Dana name. Veramyst is expensive and I'm not sure if the patient is aware of this.

## 2010-09-22 NOTE — Telephone Encounter (Signed)
Unable to reach pt. Pt has appt with Dr. Haroldine Laws on 09/23/10 at 12:30pm I will forward to [facility]. Horton Chin RN

## 2010-09-23 ENCOUNTER — Encounter: Payer: Self-pay | Admitting: Internal Medicine

## 2010-09-23 ENCOUNTER — Ambulatory Visit (INDEPENDENT_AMBULATORY_CARE_PROVIDER_SITE_OTHER): Payer: BC Managed Care – PPO | Admitting: Internal Medicine

## 2010-09-23 VITALS — BP 132/78 | HR 84 | Ht 59.5 in | Wt 224.0 lb

## 2010-09-23 DIAGNOSIS — I4891 Unspecified atrial fibrillation: Secondary | ICD-10-CM

## 2010-09-23 DIAGNOSIS — I509 Heart failure, unspecified: Secondary | ICD-10-CM

## 2010-09-23 DIAGNOSIS — L089 Local infection of the skin and subcutaneous tissue, unspecified: Secondary | ICD-10-CM

## 2010-09-23 DIAGNOSIS — I480 Paroxysmal atrial fibrillation: Secondary | ICD-10-CM

## 2010-09-23 DIAGNOSIS — I5032 Chronic diastolic (congestive) heart failure: Secondary | ICD-10-CM

## 2010-09-23 MED ORDER — FUROSEMIDE 40 MG PO TABS: 40.0000 mg | ORAL_TABLET | ORAL | Status: DC

## 2010-09-23 MED ORDER — POTASSIUM CHLORIDE 20 MEQ PO PACK: 20.0000 meq | PACK | ORAL | Status: DC

## 2010-09-23 MED ORDER — DOXYCYCLINE HYCLATE 100 MG PO TABS: 100.0000 mg | ORAL_TABLET | Freq: Two times a day (BID) | ORAL | Status: AC

## 2010-09-23 NOTE — Telephone Encounter (Signed)
Pt aware to get from pcp

## 2010-09-23 NOTE — Patient Instructions (Signed)
Start Lasix 20 mg every other day Start Potassium 20 every other day Start Doxy 100 mg Twice daily for 7 days  Follow up with Coumadin Clinic on Fri 8/24 at 3:30  Follow up with your Primary Care on Mon 8/27  Your physician wants you to follow-up in: 6 months.  You will receive a reminder letter in the mail two months in advance. If you don't receive a letter, please call our office to schedule the follow-up appointment.

## 2010-09-23 NOTE — Progress Notes (Signed)
History of Present Illness: PCP:  Kelby Aline, PA-C (at Dr. Idell Pickles office) Primary Cardiologist:  Dr. [name]  [name] is a 50 y.o. female with a history of hypertension, hyperlipidemia, sleep apnea, glucose intolerance, COPD and GERD as well as paroxysmal atrial fibrillation.  She had done well on flecainide in the past.  She failed Tikosyn due to prolonged QT.   She was admitted 6/2-6/10.  Prior to admission, she had stopped all of her medications due to difficulties with finances.  She had been developing increasing dyspnea requiring several rounds of antibiotics and prednisone taper.  She presented to the hospital with narrow complex tachycardia treated with adenosine x2.  She developed normal sinus rhythm briefly and then converted to atrial flutter with rapid ventricular rate.  Myocardial infarction was ruled out.  She continued to have intermittent episodes of atrial flutter.  She was placed on flecainide.  She was seen by EP.  Given her multiple atrial arrhythmias, ablation therapy was not felt to be helpful and she was continued on flecainide.  She had continued dyspnea and CT scan demonstrated a right lower lobe nonocclusive pulmonary embolism.  She was placed on heparin and Coumadin.  She was also seen by internal medicine for headaches which resolved with Fioricet.  She developed volume overload and was diuresed for acute on chronic diastolic heart failure.  Pulmonary saw the patient and felt that she had an acute on chronic COPD flare and placed on prednisone taper as well as adjusted her inhaled medications.  She has follow up today with pulmonary and will likely have a sleep test scheduled for sleep apnea.    She presents today for followup. Doing well.  Breathing better.  No chest pain.  No orthopnea or PND.  She is taking flecainide 100 mg BID. Coumadin checked 2 weeks ago and was 2.5, No bleeding. Stopped lasix about 2 weeks because she felt her legs were getting tight  and going to pop. No palpitations.  Not smoking for 1 year.     Past Medical History  Diagnosis Date  . HTN (hypertension)   . Hyperlipidemia   . GERD (gastroesophageal reflux disease)   . Depression   . Anxiety   . Paroxysmal atrial fibrillation     Myoview 4/09: No ischemia;  echo 4/09: EF 60-65%;   Flecainide, beta blocker, Coumadin;    Unable to take Tikosyn 2/2 prolonged QT  . Obstructive sleep apnea   . Borderline diabetes   . Atrial flutter   . Chronic diastolic heart failure   . Pulmonary embolism     Right lower lobe diagnosed by CT 6/12    Current Outpatient Prescriptions  Medication Sig Dispense Refill  . ALPRAZolam (XANAX) 0.25 MG tablet Take 1 tablet (0.25 mg total) by mouth at bedtime as needed.  90 tablet  3  . diltiazem (CARDIZEM CD) 360 MG 24 hr capsule Take 1 capsule (360 mg total) by mouth daily.  90 capsule  4  . flecainide (TAMBOCOR) 50 MG tablet Take 2 tablets (100 mg total) by mouth 2 (two) times daily.  180 tablet  4  . fluticasone (VERAMYST) 27.5 MCG/SPRAY nasal spray Place 2 sprays into the nose daily.  10 g  3  . Levalbuterol Tartrate (XOPENEX HFA IN) Inhale 2 puffs into the lungs every 6 (six) hours as needed.       . loratadine (CLARITIN) 10 MG tablet Take 10 mg by mouth daily.        Marland Kitchen  pantoprazole (PROTONIX) 40 MG tablet Take 1 tablet (40 mg total) by mouth daily.  90 tablet  3  . warfarin (COUMADIN) 5 MG tablet Take as directed by Anticoagulation clinic Pt takes up to 1 1/2 tablets daily  100 tablet  0  . furosemide (LASIX) 40 MG tablet Take 1 tablet (40 mg total) by mouth daily.  90 tablet  4  . potassium chloride (KLOR-CON) 20 MEQ packet Take 20 mEq by mouth daily.  90 tablet  4    Allergies: Allergies  Allergen Reactions  . Sulfonamide Derivatives    Social Hx:  No smoking.  Vital Signs: BP 132/78  Pulse 84  Ht 4' 11.5" (1.511 m)  Wt 224 lb (101.606 kg)  BMI 44.49 kg/m2  PHYSICAL EXAM: Well nourished, well developed, in no acute  distress HEENT: normal Neck: no JVD Cardiac:  normal S1, S2; RRR; no murmur Lungs:  Decreased breath sounds bilaterally, no wheezing, rhonchi or rales Abd: obese. soft, nontender, no hepatomegaly Ext: no edema. Infected boil on posterior left thigh Neuro:  CNs 2-12 intact, no focal abnormalities noted  EKG:   Sinus rhythm, heart rate 85, with PACs normal axis, nonspecific ST-T wave changes  ASSESSMENT AND PLAN:

## 2010-09-25 ENCOUNTER — Ambulatory Visit (INDEPENDENT_AMBULATORY_CARE_PROVIDER_SITE_OTHER): Payer: BC Managed Care – PPO | Admitting: *Deleted

## 2010-09-25 DIAGNOSIS — Z7901 Long term (current) use of anticoagulants: Secondary | ICD-10-CM

## 2010-09-25 DIAGNOSIS — I4892 Unspecified atrial flutter: Secondary | ICD-10-CM

## 2010-09-25 LAB — POCT INR: INR: 2.5

## 2010-09-26 DIAGNOSIS — L089 Local infection of the skin and subcutaneous tissue, unspecified: Secondary | ICD-10-CM | POA: Insufficient documentation

## 2010-09-26 NOTE — Assessment & Plan Note (Signed)
Maintaining SR on flecainide. Continue coumadin.

## 2010-09-26 NOTE — Assessment & Plan Note (Signed)
Likely staph. Will treat with doxy x 7 days. If getting worse or not improving call PCP immediately.

## 2010-09-26 NOTE — Assessment & Plan Note (Signed)
Volume status looks great. Continue current therapy.

## 2010-09-28 ENCOUNTER — Encounter: Payer: Self-pay | Admitting: Internal Medicine

## 2010-10-09 ENCOUNTER — Encounter: Payer: BC Managed Care – PPO | Admitting: *Deleted

## 2010-10-23 ENCOUNTER — Encounter: Payer: BC Managed Care – PPO | Admitting: *Deleted

## 2010-11-02 LAB — T4, FREE: Free T4: 1.18

## 2010-11-02 LAB — BASIC METABOLIC PANEL
BUN: 13
CO2: 25
Calcium: 9.2
Chloride: 104
Creatinine, Ser: 1
GFR calc Af Amer: >60
GFR calc non Af Amer: 59 — ABNORMAL LOW
Glucose, Bld: 111 — ABNORMAL HIGH
Potassium: 3.6
Sodium: 139

## 2010-11-02 LAB — POCT CARDIAC MARKERS
CKMB, poc: 1.1
Myoglobin, poc: 79
Operator id: 146091
Troponin i, poc: <0.05

## 2010-11-02 LAB — TSH: TSH: 2.509

## 2011-08-11 ENCOUNTER — Telehealth: Payer: Self-pay | Admitting: *Deleted

## 2011-08-11 ENCOUNTER — Ambulatory Visit: Payer: Self-pay | Admitting: Internal Medicine

## 2011-08-11 DIAGNOSIS — I4892 Unspecified atrial flutter: Secondary | ICD-10-CM

## 2011-08-11 NOTE — Telephone Encounter (Signed)
Wilson. Needs cardiology f/u at some point.

## 2011-08-11 NOTE — Telephone Encounter (Signed)
Telephoned pt to discuss past due CVRR appt, last seen 09/2010. She states she could not afford the co-pays so she discontinued her coumadin and started taking ASA.

## 2011-08-11 NOTE — Telephone Encounter (Signed)
[name] can you please schedule her an appointment, it can be with anyone, thanks

## 2011-10-04 ENCOUNTER — Ambulatory Visit (INDEPENDENT_AMBULATORY_CARE_PROVIDER_SITE_OTHER): Payer: BC Managed Care – PPO | Admitting: Family Medicine

## 2011-10-04 VITALS — BP 162/111 | HR 87 | Temp 99.2°F | Resp 18 | Ht 59.5 in | Wt 231.0 lb

## 2011-10-04 DIAGNOSIS — L0390 Cellulitis, unspecified: Secondary | ICD-10-CM

## 2011-10-04 DIAGNOSIS — L0291 Cutaneous abscess, unspecified: Secondary | ICD-10-CM

## 2011-10-04 MED ORDER — DOXYCYCLINE HYCLATE 100 MG PO TABS: 100.0000 mg | ORAL_TABLET | Freq: Two times a day (BID) | ORAL | Status: AC

## 2011-10-04 NOTE — Progress Notes (Signed)
51 yo with 1 day of progressive left neck swelling and redness  Objective:  NAD Red area left anterior neck with swollen 2 cm node proximal to a vesicular eruption  Assessment: cellulitis  Plan:  Doxycycline 1. Cellulitis  doxycycline (VIBRA-TABS) 100 MG tablet

## 2011-10-04 NOTE — Patient Instructions (Signed)
Cellulitis Cellulitis is an infection of the skin and the tissue beneath it. The area is typically red and tender. It is caused by germs (bacteria) (usually staph or strep) that enter the body through cuts or sores. Cellulitis most commonly occurs in the arms or lower legs.  HOME CARE INSTRUCTIONS   If you are given a prescription for medications which kill germs (antibiotics), take as directed until finished.   If the infection is on the arm or leg, keep the limb elevated as able.   Use a warm cloth several times per day to relieve pain and encourage healing.   See your caregiver for recheck of the infected site as directed if problems arise.   Only take over-the-counter or prescription medicines for pain, discomfort, or fever as directed by your caregiver.  SEEK MEDICAL CARE IF:   The area of redness (inflammation) is spreading, there are red streaks coming from the infected site, or if a part of the infection begins to turn dark in color.   The joint or bone underneath the infected skin becomes painful after the skin has healed.   The infection returns in the same or another area after it seems to have gone away.   A boil or bump swells up. This may be an abscess.   New, unexplained problems such as pain or fever develop.  SEEK IMMEDIATE MEDICAL CARE IF:   You have a fever.   You or your child feels drowsy or lethargic.   There is vomiting, diarrhea, or lasting discomfort or feeling ill (malaise) with muscle aches and pains.  MAKE SURE YOU:   Understand these instructions.   Will watch your condition.   Will get help right away if you are not doing well or get worse.  Document Released: 11/04/2004 Document Revised: 01/14/2011 Document Reviewed: 09/13/2007 [facility] Patient Information 2012 [place].

## 2012-01-14 ENCOUNTER — Ambulatory Visit (INDEPENDENT_AMBULATORY_CARE_PROVIDER_SITE_OTHER): Payer: BC Managed Care – PPO | Admitting: Physician Assistant

## 2012-01-14 ENCOUNTER — Inpatient Hospital Stay (HOSPITAL_COMMUNITY)
Admission: AD | Admit: 2012-01-14 | Discharge: 2012-01-23 | DRG: 544 | Disposition: A | Discharge status: Home / Self Care | Payer: BC Managed Care – PPO | Source: Ambulatory Visit | Attending: Cardiovascular Disease | Admitting: Cardiovascular Disease

## 2012-01-14 ENCOUNTER — Encounter (HOSPITAL_COMMUNITY): Payer: Self-pay | Admitting: Nurse Practitioner

## 2012-01-14 ENCOUNTER — Ambulatory Visit: Payer: BC Managed Care – PPO

## 2012-01-14 VITALS — BP 144/82 | HR 120 | Temp 97.7°F | Resp 19 | Ht 59.5 in | Wt 228.0 lb

## 2012-01-14 DIAGNOSIS — R0602 Shortness of breath: Secondary | ICD-10-CM

## 2012-01-14 DIAGNOSIS — J9600 Acute respiratory failure, unspecified whether with hypoxia or hypercapnia: Secondary | ICD-10-CM | POA: Diagnosis present

## 2012-01-14 DIAGNOSIS — F411 Generalized anxiety disorder: Secondary | ICD-10-CM | POA: Diagnosis present

## 2012-01-14 DIAGNOSIS — I4891 Unspecified atrial fibrillation: Principal | ICD-10-CM

## 2012-01-14 DIAGNOSIS — I10 Essential (primary) hypertension: Secondary | ICD-10-CM

## 2012-01-14 DIAGNOSIS — I480 Paroxysmal atrial fibrillation: Secondary | ICD-10-CM

## 2012-01-14 DIAGNOSIS — I2699 Other pulmonary embolism without acute cor pulmonale: Secondary | ICD-10-CM

## 2012-01-14 DIAGNOSIS — Z7901 Long term (current) use of anticoagulants: Secondary | ICD-10-CM

## 2012-01-14 DIAGNOSIS — Z79899 Other long term (current) drug therapy: Secondary | ICD-10-CM

## 2012-01-14 DIAGNOSIS — J449 Chronic obstructive pulmonary disease, unspecified: Secondary | ICD-10-CM

## 2012-01-14 DIAGNOSIS — I4892 Unspecified atrial flutter: Secondary | ICD-10-CM

## 2012-01-14 DIAGNOSIS — F329 Major depressive disorder, single episode, unspecified: Secondary | ICD-10-CM | POA: Diagnosis present

## 2012-01-14 DIAGNOSIS — Z6841 Body Mass Index (BMI) 40.0 and over, adult: Secondary | ICD-10-CM

## 2012-01-14 DIAGNOSIS — R609 Edema, unspecified: Secondary | ICD-10-CM

## 2012-01-14 DIAGNOSIS — R600 Localized edema: Secondary | ICD-10-CM

## 2012-01-14 DIAGNOSIS — Z9119 Patient's noncompliance with other medical treatment and regimen: Secondary | ICD-10-CM

## 2012-01-14 DIAGNOSIS — Z8673 Personal history of transient ischemic attack (TIA), and cerebral infarction without residual deficits: Secondary | ICD-10-CM

## 2012-01-14 DIAGNOSIS — G43909 Migraine, unspecified, not intractable, without status migrainosus: Secondary | ICD-10-CM | POA: Diagnosis present

## 2012-01-14 DIAGNOSIS — L089 Local infection of the skin and subcutaneous tissue, unspecified: Secondary | ICD-10-CM

## 2012-01-14 DIAGNOSIS — Z87891 Personal history of nicotine dependence: Secondary | ICD-10-CM

## 2012-01-14 DIAGNOSIS — I5033 Acute on chronic diastolic (congestive) heart failure: Secondary | ICD-10-CM

## 2012-01-14 DIAGNOSIS — E876 Hypokalemia: Secondary | ICD-10-CM | POA: Diagnosis present

## 2012-01-14 DIAGNOSIS — Z7982 Long term (current) use of aspirin: Secondary | ICD-10-CM

## 2012-01-14 DIAGNOSIS — K219 Gastro-esophageal reflux disease without esophagitis: Secondary | ICD-10-CM

## 2012-01-14 DIAGNOSIS — Z91199 Patient's noncompliance with other medical treatment and regimen due to unspecified reason: Secondary | ICD-10-CM

## 2012-01-14 DIAGNOSIS — I5032 Chronic diastolic (congestive) heart failure: Secondary | ICD-10-CM

## 2012-01-14 DIAGNOSIS — G4733 Obstructive sleep apnea (adult) (pediatric): Secondary | ICD-10-CM

## 2012-01-14 DIAGNOSIS — I509 Heart failure, unspecified: Secondary | ICD-10-CM | POA: Diagnosis present

## 2012-01-14 DIAGNOSIS — J4489 Other specified chronic obstructive pulmonary disease: Secondary | ICD-10-CM | POA: Diagnosis present

## 2012-01-14 DIAGNOSIS — F3289 Other specified depressive episodes: Secondary | ICD-10-CM

## 2012-01-14 DIAGNOSIS — E785 Hyperlipidemia, unspecified: Secondary | ICD-10-CM | POA: Diagnosis present

## 2012-01-14 HISTORY — DX: Other persistent atrial fibrillation: I48.19

## 2012-01-14 LAB — POCT CBC
Granulocyte percent: 55.5 %G (ref 37–80)
HCT, POC: 48.8 % — AB (ref 37.7–47.9)
Hemoglobin: 14.9 g/dL (ref 12.2–16.2)
Lymph, poc: 3 (ref 0.6–3.4)
MCH, POC: 27.4 pg (ref 27–31.2)
MCHC: 30.5 g/dL — AB (ref 31.8–35.4)
MCV: 89.8 fL (ref 80–97)
MID (cbc): 0.4 (ref 0–0.9)
MPV: 13.4 fL (ref 0–99.8)
POC Granulocyte: 4.3 (ref 2–6.9)
POC LYMPH PERCENT: 39.1 %L (ref 10–50)
POC MID %: 5.4 %M (ref 0–12)
Platelet Count, POC: 220 10*3/uL (ref 142–424)
RBC: 5.43 M/uL (ref 4.04–5.48)
RDW, POC: 14.7 %
WBC: 7.8 10*3/uL (ref 4.6–10.2)

## 2012-01-14 LAB — PROTIME-INR
INR: 1 (ref 0.00–1.49)
Prothrombin Time: 13.1 seconds (ref 11.6–15.2)

## 2012-01-14 LAB — MAGNESIUM: Magnesium: 2.1 mg/dL (ref 1.5–2.5)

## 2012-01-14 LAB — BASIC METABOLIC PANEL
BUN: 18 mg/dL (ref 6–23)
CO2: 24 mEq/L (ref 19–32)
Calcium: 9.4 mg/dL (ref 8.4–10.5)
Chloride: 105 mEq/L (ref 96–112)
Creatinine, Ser: 1.14 mg/dL — ABNORMAL HIGH (ref 0.50–1.10)
GFR calc Af Amer: 62 mL/min — ABNORMAL LOW (ref >90)
GFR calc non Af Amer: 54 mL/min — ABNORMAL LOW (ref >90)
Glucose, Bld: 100 mg/dL — ABNORMAL HIGH (ref 70–99)
Potassium: 3.5 mEq/L (ref 3.5–5.1)
Sodium: 143 mEq/L (ref 135–145)

## 2012-01-14 LAB — PRO B NATRIURETIC PEPTIDE: Pro B Natriuretic peptide (BNP): 1845 pg/mL — ABNORMAL HIGH (ref 0–125)

## 2012-01-14 LAB — TROPONIN I: Troponin I: <0.3 ng/mL (ref <0.30)

## 2012-01-14 LAB — GLUCOSE, POCT (MANUAL RESULT ENTRY): POC Glucose: 89 mg/dl (ref 70–99)

## 2012-01-14 MED ORDER — ASPIRIN EC 81 MG PO TBEC
81.0000 mg | DELAYED_RELEASE_TABLET | Freq: Every day | ORAL | Status: DC
  Administered 2012-01-14 – 2012-01-21 (×6): 81 mg via ORAL
  Filled 2012-01-14 (×8): qty 1

## 2012-01-14 MED ORDER — FLUTICASONE FUROATE 27.5 MCG/SPRAY NA SUSP: 2.0000 | Freq: Every day | NASAL | Status: DC

## 2012-01-14 MED ORDER — POTASSIUM CHLORIDE 20 MEQ PO PACK: 20.0000 meq | PACK | Freq: Every day | ORAL | Status: DC

## 2012-01-14 MED ORDER — ONDANSETRON HCL 4 MG/2ML IJ SOLN
4.0000 mg | Freq: Four times a day (QID) | INTRAMUSCULAR | Status: DC | PRN
  Administered 2012-01-15 – 2012-01-18 (×2): 4 mg via INTRAVENOUS
  Filled 2012-01-14 (×2): qty 2

## 2012-01-14 MED ORDER — SODIUM CHLORIDE 0.9 % IV SOLN
250.0000 mL | INTRAVENOUS | Status: DC | PRN
  Administered 2012-01-17: 250 mL via INTRAVENOUS

## 2012-01-14 MED ORDER — FUROSEMIDE 10 MG/ML IJ SOLN
INTRAMUSCULAR | Status: AC
  Administered 2012-01-14: 40 mg via INTRAVENOUS
  Filled 2012-01-14: qty 4

## 2012-01-14 MED ORDER — DILTIAZEM LOAD VIA INFUSION
10.0000 mg | Freq: Once | INTRAVENOUS | Status: AC
  Administered 2012-01-14: 10 mg via INTRAVENOUS
  Filled 2012-01-14: qty 10

## 2012-01-14 MED ORDER — ASPIRIN 81 MG PO TABS: 81.0000 mg | ORAL_TABLET | Freq: Every day | ORAL | Status: DC

## 2012-01-14 MED ORDER — NITROGLYCERIN 0.4 MG SL SUBL: 0.4000 mg | SUBLINGUAL_TABLET | SUBLINGUAL | Status: DC | PRN

## 2012-01-14 MED ORDER — HEPARIN BOLUS VIA INFUSION
4000.0000 [IU] | Freq: Once | INTRAVENOUS | Status: AC
  Administered 2012-01-14: 4000 [IU] via INTRAVENOUS
  Filled 2012-01-14: qty 4000

## 2012-01-14 MED ORDER — SODIUM CHLORIDE 0.9 % IJ SOLN
3.0000 mL | Freq: Two times a day (BID) | INTRAMUSCULAR | Status: DC
  Administered 2012-01-14 – 2012-01-23 (×12): 3 mL via INTRAVENOUS

## 2012-01-14 MED ORDER — PANTOPRAZOLE SODIUM 40 MG PO TBEC
40.0000 mg | DELAYED_RELEASE_TABLET | Freq: Every day | ORAL | Status: DC
  Administered 2012-01-15 – 2012-01-23 (×8): 40 mg via ORAL
  Filled 2012-01-14 (×5): qty 1

## 2012-01-14 MED ORDER — ACETAMINOPHEN 325 MG PO TABS
650.0000 mg | ORAL_TABLET | ORAL | Status: DC | PRN
  Administered 2012-01-15 – 2012-01-18 (×4): 650 mg via ORAL
  Filled 2012-01-14 (×5): qty 2

## 2012-01-14 MED ORDER — ALBUTEROL SULFATE HFA 108 (90 BASE) MCG/ACT IN AERS
2.0000 | INHALATION_SPRAY | Freq: Four times a day (QID) | RESPIRATORY_TRACT | Status: DC | PRN
  Filled 2012-01-14 (×3): qty 6.7

## 2012-01-14 MED ORDER — IPRATROPIUM BROMIDE 0.02 % IN SOLN
0.5000 mg | Freq: Once | RESPIRATORY_TRACT | Status: AC
  Administered 2012-01-14: 0.5 mg via RESPIRATORY_TRACT

## 2012-01-14 MED ORDER — FLECAINIDE ACETATE 50 MG PO TABS
150.0000 mg | ORAL_TABLET | Freq: Once | ORAL | Status: AC
  Administered 2012-01-14: 150 mg via ORAL
  Filled 2012-01-14: qty 1

## 2012-01-14 MED ORDER — FUROSEMIDE 10 MG/ML IJ SOLN
40.0000 mg | Freq: Once | INTRAMUSCULAR | Status: AC
  Administered 2012-01-14: 40 mg via INTRAVENOUS

## 2012-01-14 MED ORDER — ALPRAZOLAM 0.25 MG PO TABS: 0.2500 mg | ORAL_TABLET | Freq: Three times a day (TID) | ORAL | Status: DC | PRN

## 2012-01-14 MED ORDER — TIOTROPIUM BROMIDE MONOHYDRATE 18 MCG IN CAPS
18.0000 ug | ORAL_CAPSULE | Freq: Every day | RESPIRATORY_TRACT | Status: DC
  Administered 2012-01-15 – 2012-01-22 (×6): 18 ug via RESPIRATORY_TRACT
  Filled 2012-01-14: qty 5

## 2012-01-14 MED ORDER — ALBUTEROL SULFATE (2.5 MG/3ML) 0.083% IN NEBU
2.5000 mg | INHALATION_SOLUTION | Freq: Once | RESPIRATORY_TRACT | Status: AC
  Administered 2012-01-14: 2.5 mg via RESPIRATORY_TRACT

## 2012-01-14 MED ORDER — SODIUM CHLORIDE 0.9 % IJ SOLN: 3.0000 mL | INTRAMUSCULAR | Status: DC | PRN

## 2012-01-14 MED ORDER — FLUTICASONE PROPIONATE 50 MCG/ACT NA SUSP
2.0000 | Freq: Every day | NASAL | Status: DC
  Administered 2012-01-15 – 2012-01-23 (×8): 2 via NASAL
  Filled 2012-01-14: qty 16

## 2012-01-14 MED ORDER — LORATADINE 10 MG PO TABS
10.0000 mg | ORAL_TABLET | Freq: Every day | ORAL | Status: DC
  Administered 2012-01-15 – 2012-01-23 (×8): 10 mg via ORAL
  Filled 2012-01-14 (×9): qty 1

## 2012-01-14 MED ORDER — POTASSIUM CHLORIDE CRYS ER 10 MEQ PO TBCR
20.0000 meq | EXTENDED_RELEASE_TABLET | Freq: Every day | ORAL | Status: DC
  Administered 2012-01-15: 20 meq via ORAL
  Filled 2012-01-14: qty 2

## 2012-01-14 MED ORDER — HEPARIN (PORCINE) IN NACL 100-0.45 UNIT/ML-% IJ SOLN
1250.0000 [IU]/h | INTRAMUSCULAR | Status: DC
  Administered 2012-01-14: 1150 [IU]/h via INTRAVENOUS
  Administered 2012-01-15 – 2012-01-17 (×3): 1250 [IU]/h via INTRAVENOUS
  Filled 2012-01-14 (×6): qty 250

## 2012-01-14 MED ORDER — FLECAINIDE ACETATE 50 MG PO TABS
75.0000 mg | ORAL_TABLET | Freq: Two times a day (BID) | ORAL | Status: DC
  Administered 2012-01-15 (×2): 75 mg via ORAL
  Filled 2012-01-14 (×6): qty 2

## 2012-01-14 MED ORDER — ZOLPIDEM TARTRATE 5 MG PO TABS
5.0000 mg | ORAL_TABLET | Freq: Every evening | ORAL | Status: DC | PRN
  Administered 2012-01-15 – 2012-01-22 (×6): 5 mg via ORAL
  Filled 2012-01-14 (×6): qty 1

## 2012-01-14 MED ORDER — DILTIAZEM HCL 100 MG IV SOLR
15.0000 mg/h | INTRAVENOUS | Status: DC
  Administered 2012-01-14 – 2012-01-15 (×3): 10 mg/h via INTRAVENOUS
  Administered 2012-01-16: 15 mg/h via INTRAVENOUS
  Administered 2012-01-16: 10 mg/h via INTRAVENOUS
  Administered 2012-01-16 – 2012-01-17 (×2): 15 mg/h via INTRAVENOUS
  Filled 2012-01-14 (×11): qty 100

## 2012-01-14 MED ORDER — ALPRAZOLAM 0.25 MG PO TABS
0.2500 mg | ORAL_TABLET | Freq: Two times a day (BID) | ORAL | Status: DC | PRN
  Administered 2012-01-15 – 2012-01-20 (×5): 0.25 mg via ORAL
  Filled 2012-01-14 (×5): qty 1

## 2012-01-14 MED ORDER — FUROSEMIDE 10 MG/ML IJ SOLN
40.0000 mg | Freq: Two times a day (BID) | INTRAMUSCULAR | Status: DC
  Administered 2012-01-15 – 2012-01-18 (×7): 40 mg via INTRAVENOUS
  Filled 2012-01-14 (×9): qty 4

## 2012-01-14 NOTE — Progress Notes (Signed)
Subjective:    Patient ID: [name], female    DOB: [birth year], 51 y.o.   MRN: [id number]  HPI This 51 y.o. female presents for evaluation of SOB.  She reports symptoms since the weather got cold in October.  About 3 weeks ago her symptoms worsened to the point that she couldn't sleep well.  Then about 1 week ago she felt like she was wheezing, "but I can't hear it."  Hasn't noticed any swelling in her legs.  Had CP in October, but did not seek treatment.  No neck, jaw or arm pain.  Hasn't taken any of her medications in about a year.  Hasn't been able to afford them, and hasn't followed up with cardiology.  Previously followed by Dr. Haroldine Laws.   Past Medical History  Diagnosis Date  . HTN (hypertension)   . Hyperlipidemia   . GERD (gastroesophageal reflux disease)   . Depression   . Anxiety   . Paroxysmal atrial fibrillation     Myoview 4/09: No ischemia;  echo 4/09: EF 60-65%;   Flecainide, beta blocker, Coumadin;    Unable to take Tikosyn 2/2 prolonged QT  . Obstructive sleep apnea   . Borderline diabetes   . Atrial flutter   . Chronic diastolic heart failure   . Pulmonary embolism     Right lower lobe diagnosed by CT 6/12  . COPD (chronic obstructive pulmonary disease)     Past Surgical History  Procedure Date  . Tubal ligation 1996    Prior to Admission medications   Medication Sig Start Date End Date Taking? Authorizing Provider  albuterol (PROVENTIL HFA;VENTOLIN HFA) 108 (90 BASE) MCG/ACT inhaler Inhale 2 puffs into the lungs every 6 (six) hours as needed.    Historical Provider, MD  ALPRAZolam Duanne Moron) 0.25 MG tablet Take 1 tablet (0.25 mg total) by mouth at bedtime as needed. 09/17/10   Jolaine Artist, MD  aspirin 81 MG tablet Take 81 mg by mouth daily.    Historical Provider, MD  diltiazem (CARDIZEM CD) 360 MG 24 hr capsule Take 1 capsule (360 mg total) by mouth daily. 09/11/10   Jolaine Artist, MD  flecainide (TAMBOCOR) 50 MG tablet Take 2 tablets (100 mg  total) by mouth 2 (two) times daily. 09/11/10   Shaune Pascal Bensimhon, MD  fluticasone (VERAMYST) 27.5 MCG/SPRAY nasal spray Place 2 sprays into the nose daily. 09/17/10   Jolaine Artist, MD  furosemide (LASIX) 40 MG tablet Take 1 tablet (40 mg total) by mouth every other day. 09/23/10   Jolaine Artist, MD  loratadine (CLARITIN) 10 MG tablet Take 10 mg by mouth daily.      Historical Provider, MD  pantoprazole (PROTONIX) 40 MG tablet Take 1 tablet (40 mg total) by mouth daily. 09/17/10   Jolaine Artist, MD  potassium chloride (KLOR-CON) 20 MEQ packet Take 20 mEq by mouth every other day. 09/23/10   Jolaine Artist, MD  tiotropium (SPIRIVA HANDIHALER) 18 MCG inhalation capsule Place 18 mcg into inhaler and inhale daily.    Historical Provider, MD  warfarin (COUMADIN) 5 MG tablet Take as directed by Anticoagulation clinic Pt takes up to 1 1/2 tablets daily 09/16/10   Jolaine Artist, MD    Allergies  Allergen Reactions  . Sulfonamide Derivatives     History   Social History  . Marital Status: Legally Separated    Spouse Name: n/a    Number of Children: 4  . Years of Education: 69  Occupational History  . clerical     [place] History Main Topics  . Smoking status: Former Smoker -- 2.0 packs/day for 35 years    Types: Cigarettes    Quit date: 09/08/2009  . Smokeless tobacco: Never Used  . Alcohol Use: No  . Drug Use: No  . Sexually Active: No   Other Topics Concern  . Not on file   Social History Narrative   Married but currently separated with 4 children.  Youngest son is a Equities trader in [employer], to graduate in 07/2012.    Family History  Problem Relation Age of Onset  . Colon cancer Mother   . Coronary artery disease Mother   . Coronary artery disease Father   . Asthma Father   . Emphysema Father   . Allergies Father   . Heart attack Maternal Grandfather   . Heart disease Maternal Grandmother   . Depression Daughter     Review of Systems As  above.  No nausea, vomiting, diarrhea.    Objective:   Physical Exam  Blood pressure 144/82, pulse 120, temperature 97.7 F (36.5 C), temperature source Oral, resp. rate 19, height 4' 11.5" (1.511 m), weight 228 lb (103.42 kg), SpO2 97.00%. Body mass index is 45.28 kg/(m^2). Well-developed, well nourished obese WF who is awake, alert and oriented, in mild respiratory distress. HEENT: [place]/AT, sclera and conjunctiva are clear. Neck: supple, non-tender, no lymphadenopathy, thyromegaly. Heart: Rapid rate, irregularly irregular, no murmur. No pre-sacral edema. Lungs: tachypneic, CTA. She reports easier breathing after neb, though tachypnea persists. Abdomen: normo-active bowel sounds, supple, non-tender, no mass or organomegaly. Extremities: no cyanosis, clubbing, trace/1+ pitting edema of both lower extremities at the ankles. Skin: warm and dry without rash. Psychologic: good mood and appropriate affect, normal speech and behavior.  Results for orders placed in visit on 01/14/12  GLUCOSE, POCT (MANUAL RESULT ENTRY)      Component Value Range   POC Glucose 89  70 - 99 mg/dl  POCT CBC      Component Value Range   WBC 7.8  4.6 - 10.2 K/uL   Lymph, poc 3.0  0.6 - 3.4   POC LYMPH PERCENT 39.1  10 - 50 %L   MID (cbc) 0.4  0 - 0.9   POC MID % 5.4  0 - 12 %M   POC Granulocyte 4.3  2 - 6.9   Granulocyte percent 55.5  37 - 80 %G   RBC 5.43  4.04 - 5.48 M/uL   Hemoglobin 14.9  12.2 - 16.2 g/dL   HCT, POC 48.8 (*) 37.7 - 47.9 %   MCV 89.8  80 - 97 fL   MCH, POC 27.4  27 - 31.2 pg   MCHC 30.5 (*) 31.8 - 35.4 g/dL   RDW, POC 14.7     Platelet Count, POC 220  142 - 424 K/uL   MPV 13.4  0 - 99.8 fL    CXR: [facility] reading (PRIMARY) by  Dr. Laney Pastor.  Significant cardiomegaly and fluid retention consistent with CHF.  EKG: AFib with rate of 153.  It is of note that she had an albuterol + atrovent neb prior to the EKG instead of after, as requested.    Assessment & Plan:   1. SOB (shortness of  breath) -CHF POCT glucose (manual entry), POCT CBC, EKG 12-Lead, DG Chest 2 View, albuterol (PROVENTIL) (2.5 MG/3ML) 0.083% nebulizer solution 2.5 mg, ipratropium (ATROVENT) nebulizer solution 0.5 mg  2. Atrial flutter  EKG 12-Lead  3. Edema extremities  POCT UA - Microscopic Only, POCT urinalysis dipstick   Spoke with cardiology regarding admission.  They will talk with bed control and call me back.

## 2012-01-14 NOTE — Progress Notes (Signed)
ANTICOAGULATION CONSULT NOTE - Initial Consult  Pharmacy Consult for Heparin Indication: atrial fibrillation and hx of PE (June 2012)  Allergies  Allergen Reactions  . Sulfonamide Derivatives   Patient Measurements: Height: 4' 11.5" (151.1 cm) Weight: 228 lb (103.42 kg) (c scale) IBW/kg (Calculated) : 44.35  Heparin Dosing Weight: 70 kg Vital Signs: Temp: 98.1 F (36.7 C) (12/06 1940) Temp src: Oral (12/06 1940) BP: 152/109 mmHg (12/06 1940) Pulse Rate: 146  (12/06 1940) Labs:  Basename 01/14/12 1705  HGB 14.9  HCT 48.8*  PLT --  APTT --  LABPROT --  INR --  HEPARINUNFRC --  CREATININE --  CKTOTAL --  CKMB --  TROPONINI --   Estimated Creatinine Clearance: 77.6 ml/min (by C-G formula based on Cr of 0.9). Medical History: Past Medical History  Diagnosis Date  . HTN (hypertension)   . Hyperlipidemia   . GERD (gastroesophageal reflux disease)   . Depression   . Anxiety   . Paroxysmal atrial fibrillation     Myoview 4/09: No ischemia;  echo 4/09: EF 60-65%;   Flecainide, beta blocker, Coumadin;    Unable to take Tikosyn 2/2 prolonged QT  . Obstructive sleep apnea   . Borderline diabetes   . Atrial flutter   . Chronic diastolic heart failure   . Pulmonary embolism     Right lower lobe diagnosed by CT 6/12  . COPD (chronic obstructive pulmonary disease)   Medications:  Prescriptions prior to admission  Medication Sig Dispense Refill  . albuterol (PROVENTIL HFA;VENTOLIN HFA) 108 (90 BASE) MCG/ACT inhaler Inhale 2 puffs into the lungs every 6 (six) hours as needed.      . ALPRAZolam (XANAX) 0.25 MG tablet Take 1 tablet (0.25 mg total) by mouth at bedtime as needed.  90 tablet  3  . aspirin 81 MG tablet Take 81 mg by mouth daily.      Marland Kitchen diltiazem (CARDIZEM CD) 360 MG 24 hr capsule Take 1 capsule (360 mg total) by mouth daily.  90 capsule  4  . flecainide (TAMBOCOR) 50 MG tablet Take 2 tablets (100 mg total) by mouth 2 (two) times daily.  180 tablet  4  . fluticasone  (VERAMYST) 27.5 MCG/SPRAY nasal spray Place 2 sprays into the nose daily.  10 g  3  . furosemide (LASIX) 40 MG tablet Take 1 tablet (40 mg total) by mouth every other day.  90 tablet  4  . loratadine (CLARITIN) 10 MG tablet Take 10 mg by mouth daily.        . pantoprazole (PROTONIX) 40 MG tablet Take 1 tablet (40 mg total) by mouth daily.  90 tablet  3  . potassium chloride (KLOR-CON) 20 MEQ packet Take 20 mEq by mouth every other day.  90 tablet  4  . tiotropium (SPIRIVA HANDIHALER) 18 MCG inhalation capsule Place 18 mcg into inhaler and inhale daily.      Marland Kitchen warfarin (COUMADIN) 5 MG tablet Take as directed by Anticoagulation clinic Pt takes up to 1 1/2 tablets daily  100 tablet  0    Assessment: 1 YOF with known diastolic CHF, atrial fibrillation, and history of PE in June 2012 on chronic Coumadin prior to admission now admitted with acute on chronic diastolic CHF and atrial fibrillation with RVR to start IV heparin per pharmacy dosing.   In June 2012, patient was therapeutic on IV heparin after a bolus of 4000 units and a rate of 1150 units/hr based on E-Chart progress notes.   Per  office/phone notes from [facility] on 08/11/11, patient had stopped taking Coumadin because she could not afford the co-pays and was on ASA alone. Unsure if she had restarted- there were orders for her to resume 62m daily and 7.535mon Mondays and Fridays; however her INR is sub-therapeutic today.   Hgb and platelets are within normal limits. No bleeding documented.  PT/INR = 13.1/1.00 -- ok to start heparin.  SCr 1.14  (stable from 1 yr prior)  Goal of Therapy:  Heparin level 0.3-0.7 units/ml Monitor platelets by anticoagulation protocol: Yes   Plan:  1. Heparin 4000 unit bolus, then rate of 1150 units/hr. 2. Heparin level 6 hours after rate is initiated (ok to do with AM labs) 3. Daily heparin level and CBC while on therapy.   JeSloan LeiterPharmD, BCPS Clinical  Pharmacist [phone]/07/2011,9:06 PM

## 2012-01-14 NOTE — Patient Instructions (Signed)
Go to [facility] Admissions.  You will be directly admitted to unit 4700, by [place].

## 2012-01-14 NOTE — H&P (Signed)
History and Physical   Patient ID: [name] MRN: [id number], DOB/AGE: [id number] 53 y.o. Date of Encounter: 01/14/2012  Primary Physician: Kelby Aline, [place] Primary Cardiologist: DB  Chief Complaint:  SOB, palpitations (from [facility])  HPI: [name] is a 51 year old female with a history of heart failure, atrial fibrillation atrial flutter.  In the last year and a half, she has gained approximately 40 pounds although she cannot tell how rapidly her weight is increased. Because of financial concerns, she has not seen a doctor in over a year. She has conserved her medication and able to take some things. She has been out of her flecainide approximately 3 months, out of other things longer. She developed increasing dyspnea on exertion, orthopnea and PND. She has chronic lower extremity exertion and has noted increase in this but isn't sure how much. She has had some episodes of chest pain but this is mostly in the midepigastric area. She also notes a feeling of early satiety and fullness and has been eating poorly over the last few weeks. She has also been sleeping very poorly. She has been out of all of her medications for months but had some old Lasix that she began taking when her symptoms got worse. Today, her symptoms worsened to the point that she went to [facility]. She was felt to be in congestive heart failure and was in atrial flutter with rapid ventricular response. She was transferred to [facility] for further evaluation and treatment.  Currently, she is anxious and dyspneic at rest. She is not having any chest discomfort or abdominal discomfort at this time. She is unable to lie back or rest. Her symptoms have improved somewhat on O2. She has no awareness of a rapid or irregular heart rate and has not been having palpitations, presyncope or syncope.  Past Medical History  Diagnosis Date  . HTN (hypertension)   . Hyperlipidemia   . GERD (gastroesophageal  reflux disease)   . Depression   . Anxiety   . Paroxysmal atrial fibrillation     Myoview 4/09: No ischemia;  echo 4/09: EF 60-65%;   Flecainide, beta blocker, Coumadin;    Unable to take Tikosyn 2/2 prolonged QT  . Obstructive sleep apnea   . Borderline diabetes   . Atrial flutter   . Chronic diastolic heart failure   . Pulmonary embolism     Right lower lobe diagnosed by CT 6/12  . COPD (chronic obstructive pulmonary disease)      Surgical History:  Past Surgical History  Procedure Date  . Tubal ligation 1996     I have reviewed the patient's current medications. She has some old Lasix but otherwise has been out of all of her medications for months Medication Sig  albuterol (PROVENTIL HFA;VENTOLIN HFA) 108 (90 BASE) MCG/ACT inhaler Inhale 2 puffs into the lungs every 6 (six) hours as needed.  ALPRAZolam (XANAX) 0.25 MG tablet Take 1 tablet (0.25 mg total) by mouth at bedtime as needed.  aspirin 81 MG tablet Take 81 mg by mouth daily.  diltiazem (CARDIZEM CD) 360 MG 24 hr capsule Take 1 capsule (360 mg total) by mouth daily.  flecainide (TAMBOCOR) 50 MG tablet Take 2 tablets (100 mg total) by mouth 2 (two) times daily.  fluticasone (VERAMYST) 27.5 MCG/SPRAY nasal spray Place 2 sprays into the nose daily.  furosemide (LASIX) 40 MG tablet Take 1 tablet (40 mg total) by mouth every other day.  loratadine (CLARITIN) 10 MG tablet Take  10 mg by mouth daily.    pantoprazole (PROTONIX) 40 MG tablet Take 1 tablet (40 mg total) by mouth daily.  potassium chloride (KLOR-CON) 20 MEQ packet Take 20 mEq by mouth every other day.  tiotropium (SPIRIVA HANDIHALER) 18 MCG inhalation capsule Place 18 mcg into inhaler and inhale daily.  warfarin (COUMADIN) 5 MG tablet Take as directed by Anticoagulation clinic Pt takes up to 1 1/2 tablets daily    Scheduled Meds:    . [COMPLETED] furosemide  40 mg Intravenous Once   Continuous Infusions:  PRN Meds:.  Allergies:  Allergies  Allergen  Reactions  . Sulfonamide Derivatives     History   Social History  . Marital Status: Legally Separated    Spouse Name: n/a    Number of Children: 4  . Years of Education: 12   Occupational History  . clerical     [place] History Main Topics  . Smoking status: Former Smoker -- 2.0 packs/day for 35 years    Types: Cigarettes    Quit date: 09/08/2009  . Smokeless tobacco: Never Used  . Alcohol Use: No  . Drug Use: No  . Sexually Active: No   Other Topics Concern  . Not on file   Social History Narrative   Married but currently separated with 4 children, 2 of whom still live with her.  Youngest son is a Equities trader in [employer], to graduate in 07/2012.     Family History  Problem Relation Age of Onset  . Colon cancer Mother   . Coronary artery disease Mother   . Coronary artery disease Father   . Asthma Father   . Emphysema Father   . Allergies Father   . Heart attack Maternal Grandfather   . Heart disease Maternal Grandmother   . Depression Daughter    Family Status  Relation Status Death Age  . Mother Deceased 83    colon cancer  . Father Alive     emphysema  . Maternal Grandfather Deceased   . Maternal Grandmother Deceased   . Daughter Alive   . Son Alive   . Paternal Grandmother Deceased   . Paternal Grandfather Deceased     Review of Systems:   Full 14-point review of systems otherwise negative except as noted above.   Physical Exam: Blood pressure 152/109, pulse 146, temperature 98.1 F (36.7 C), temperature source Oral, resp. rate 22, height 4' 11.5" (1.511 m), weight 228 lb (103.42 kg), SpO2 96.00%. General: Well developed, well nourished, female in respiratory distress. Head: Normocephalic, atraumatic, sclera non-icteric, no xanthomas, nares are without discharge. Dentition: Moderate Neck: No carotid bruits. JVD elevated but difficult to assess secondary to body habitus. No thyromegally Lungs: Good expansion bilaterally. without lower  airway wheezes or rhonchi. Rales bilateral bases and some upper airway wheezing, some crackles as well. Heart: IRRegular rate and rhythm with S1 S2.  No S3 or S4.  No murmur, no rubs, or gallops appreciated. Abdomen: Soft, non-tender, non-distended with normoactive bowel sounds. + hepatomegaly. No rebound/guarding. No obvious abdominal masses. Msk:  Strength and tone appear normal for age. No joint deformities or effusions, no spine or costo-vertebral angle tenderness. Extremities: No clubbing or cyanosis. 1-2+ edema.  Distal pedal pulses are 2+ in 4 extrem Neuro: Alert and oriented X 3. Moves all extremities spontaneously. No focal deficits noted. Psych:  Responds to questions appropriately with a normal affect. Skin: No rashes or lesions noted  Labs:   Lab Results  Component Value  Date   WBC 7.8 01/14/2012   HGB 14.9 01/14/2012   HCT 48.8* 01/14/2012   MCV 89.8 01/14/2012   PLT 295 07/17/2010   No results found for this basename: INR in the last 72 hours No results found for this basename: NA,K,CL,CO2,BUN,CREATININE,CALCIUM,LABALBU,PROT,BILITOT,ALKPHOS,ALT,AST,GLUCOSE in the last 168 hours No results found for this basename: CKTOTAL:4,CKMB:4,TROPONINI:4 in the last 72 hours No results found for this basename: TROPIPOC:3 in the last 72 hours  Pro B Natriuretic peptide (BNP)  Date/Time Value Range Status  07/19/2010  5:25 AM 71.1  0 - 125 pg/mL Final  07/15/2010  4:17 AM 258.2* 0 - 125 pg/mL Final   Radiology/Studies: Dg Chest 2 View  01/14/2012  *RADIOLOGY REPORT*  Clinical Data: 51 year old female with shortness of breath and swelling.  CHEST - 2 VIEW  Comparison: None  Findings: Cardiomegaly is present. There is no evidence of focal airspace disease, pulmonary edema, suspicious pulmonary nodule/mass, pleural effusion, or pneumothorax. No acute bony abnormalities are identified. Mild elevation of the right hemidiaphragm is present.  IMPRESSION: Cardiomegaly without evidence of acute  cardiopulmonary disease.   Original Report Authenticated By: Margarette Canada, M.D.     Echo: 05/29/2007 SUMMARY - Overall left ventricular systolic function was normal. Left ventricular ejection fraction was estimated , range being 60 % to 65 %. Although no diagnostic left ventricular regional wall motion abnormality was identified, this possibility cannot be completely excluded on the basis of this study. - The interatrial septum was thickened. IMPRESSIONS - Technically difficult study due to poor sound wave transmission. Patient developed atrial fibrillation during study.   ECG:  Atrial Flutter, rate approx 150, no acute ischemic changes  ASSESSMENT AND PLAN:  Active Problems:  Atrial flutter with rapid ventricular response  OBSTRUCTIVE SLEEP APNEA  HTN (hypertension)  COPD (chronic obstructive pulmonary disease)  Acute on chronic diastolic CHF (congestive heart failure), NYHA class 3  She will be admitted and diuresed. We will add IV Cardizem for rate control and restart flecainide. She will be anticoagulated with heparin and consideration given to choosing an oral anticoagulant once her financial status is evaluated further as the patient states she has trouble affording her medications. She has had trouble tolerating a BiPAP mask in the past so will not start this at this time.  SignedSuanne Marker Barrett PA-C 01/14/2012, 8:08 PM  Patient examined Chart reviewed.  Primary issue is inability to afford medication.  Similar presentation last year.  Rate control with iv cardizem.  She has had success with flecainide in past with no history of CAD and normal EF.  IV diuresis.  Repeate echo in am.  Will not be able to afford novel anticoagulant med on d/c and suspect F/U coumadin visits will also be an issue.  Discussed plan of care with patient and son.  Jenkins Rouge

## 2012-01-15 DIAGNOSIS — I509 Heart failure, unspecified: Secondary | ICD-10-CM

## 2012-01-15 DIAGNOSIS — I4891 Unspecified atrial fibrillation: Principal | ICD-10-CM

## 2012-01-15 DIAGNOSIS — I5033 Acute on chronic diastolic (congestive) heart failure: Secondary | ICD-10-CM

## 2012-01-15 DIAGNOSIS — I10 Essential (primary) hypertension: Secondary | ICD-10-CM

## 2012-01-15 LAB — TROPONIN I
Troponin I: <0.3 ng/mL (ref <0.30)
Troponin I: <0.3 ng/mL (ref <0.30)

## 2012-01-15 LAB — COMPREHENSIVE METABOLIC PANEL
ALT: 49 U/L — ABNORMAL HIGH (ref 0–35)
AST: 31 U/L (ref 0–37)
Albumin: 3.8 g/dL (ref 3.5–5.2)
Alkaline Phosphatase: 83 U/L (ref 39–117)
BUN: 15 mg/dL (ref 6–23)
CO2: 22 mEq/L (ref 19–32)
Calcium: 8.7 mg/dL (ref 8.4–10.5)
Chloride: 103 mEq/L (ref 96–112)
Creatinine, Ser: 1.02 mg/dL (ref 0.50–1.10)
GFR calc Af Amer: 71 mL/min — ABNORMAL LOW (ref >90)
GFR calc non Af Amer: 62 mL/min — ABNORMAL LOW (ref >90)
Glucose, Bld: 107 mg/dL — ABNORMAL HIGH (ref 70–99)
Potassium: 3.3 mEq/L — ABNORMAL LOW (ref 3.5–5.1)
Sodium: 142 mEq/L (ref 135–145)
Total Bilirubin: 0.7 mg/dL (ref 0.3–1.2)
Total Protein: 6.6 g/dL (ref 6.0–8.3)

## 2012-01-15 LAB — CBC
HCT: 43.3 % (ref 36.0–46.0)
Hemoglobin: 14.1 g/dL (ref 12.0–15.0)
MCH: 28 pg (ref 26.0–34.0)
MCHC: 32.6 g/dL (ref 30.0–36.0)
MCV: 86.1 fL (ref 78.0–100.0)
Platelets: 182 10*3/uL (ref 150–400)
RBC: 5.03 MIL/uL (ref 3.87–5.11)
RDW: 14.6 % (ref 11.5–15.5)
WBC: 7.6 10*3/uL (ref 4.0–10.5)

## 2012-01-15 LAB — HEMOGLOBIN A1C
Hgb A1c MFr Bld: 6.1 % — ABNORMAL HIGH (ref <5.7)
Mean Plasma Glucose: 128 mg/dL — ABNORMAL HIGH (ref <117)

## 2012-01-15 LAB — TSH: TSH: 4.798 u[IU]/mL — ABNORMAL HIGH (ref 0.350–4.500)

## 2012-01-15 LAB — D-DIMER, QUANTITATIVE: D-Dimer, Quant: 0.28 ug/mL-FEU (ref 0.00–0.48)

## 2012-01-15 LAB — HEPARIN LEVEL (UNFRACTIONATED)
Heparin Unfractionated: 0.5 IU/mL (ref 0.30–0.70)
Heparin Unfractionated: 0.31 IU/mL (ref 0.30–0.70)

## 2012-01-15 MED ORDER — POTASSIUM CHLORIDE CRYS ER 20 MEQ PO TBCR
40.0000 meq | EXTENDED_RELEASE_TABLET | Freq: Every day | ORAL | Status: DC
  Administered 2012-01-16 – 2012-01-17 (×2): 40 meq via ORAL
  Filled 2012-01-15 (×3): qty 2

## 2012-01-15 MED ORDER — TRAMADOL HCL 50 MG PO TABS
50.0000 mg | ORAL_TABLET | Freq: Four times a day (QID) | ORAL | Status: DC | PRN
  Administered 2012-01-15 – 2012-01-18 (×3): 50 mg via ORAL
  Filled 2012-01-15 (×4): qty 1

## 2012-01-15 MED ORDER — OXYCODONE HCL 5 MG PO TABS
5.0000 mg | ORAL_TABLET | Freq: Three times a day (TID) | ORAL | Status: DC | PRN
  Administered 2012-01-15: 5 mg via ORAL
  Filled 2012-01-15: qty 1

## 2012-01-15 MED ORDER — POTASSIUM CHLORIDE CRYS ER 20 MEQ PO TBCR
40.0000 meq | EXTENDED_RELEASE_TABLET | Freq: Once | ORAL | Status: AC
  Administered 2012-01-15: 40 meq via ORAL
  Filled 2012-01-15: qty 2

## 2012-01-15 MED ORDER — PROMETHAZINE HCL 25 MG/ML IJ SOLN
12.5000 mg | Freq: Four times a day (QID) | INTRAMUSCULAR | Status: DC | PRN
  Administered 2012-01-15: 12.5 mg via INTRAVENOUS
  Filled 2012-01-15: qty 1

## 2012-01-15 MED ORDER — OXYCODONE-ACETAMINOPHEN 5-325 MG PO TABS: 1.0000 | ORAL_TABLET | Freq: Three times a day (TID) | ORAL | Status: DC | PRN

## 2012-01-15 NOTE — Progress Notes (Signed)
ANTICOAGULATION CONSULT NOTE - follow up Pharmacy Consult for Heparin Indication: atrial fibrillation and hx of PE (June 2012)  Allergies  Allergen Reactions  . Sulfonamide Derivatives   Patient Measurements: Height: 4' 11.5" (151.1 cm) Weight: 225 lb 6.4 oz (102.241 kg) (scale c) IBW/kg (Calculated) : 44.35  Heparin Dosing Weight: 70 kg Vital Signs: Temp: 97.5 F (36.4 C) (12/07 0539) Temp src: Oral (12/07 0539) BP: 112/78 mmHg (12/07 1019) Pulse Rate: 90  (12/07 1019) Labs:  Basename 01/15/12 0817 01/15/12 0236 01/14/12 2058 01/14/12 1705  HGB -- 14.1 -- 14.9  HCT -- 43.3 -- 48.8*  PLT -- 182 -- --  APTT -- -- -- --  LABPROT -- -- 13.1 --  INR -- -- 1.00 --  HEPARINUNFRC 0.31 0.50 -- --  CREATININE -- 1.02 1.14* --  CKTOTAL -- -- -- --  CKMB -- -- -- --  TROPONINI <0.30 <0.30 <0.30 --   Estimated Creatinine Clearance: 68 ml/min (by C-G formula based on Cr of 1.02).  Assessment: [name] with known diastolic CHF, atrial fibrillation, and history of PE in June 2012 on chronic Coumadin previous to admission but stopped taking 2nd financial reasons.  She is now admitted with acute on chronic diastolic CHF and atrial fibrillation with RVR on  heparin per pharmacy dosing.   In June 2012, patient was therapeutic on IV heparin after a bolus of 4000 units and a rate of 1150 units/hr based on E-Chart progress notes.   Per office/phone notes from [facility] on 08/11/11, patient had stopped taking Coumadin because she could not afford the co-pays and was on ASA alone. Unsure if she had restarted- there were orders for her to resume 75m daily and 7.534mon Mondays and Fridays; however her INR on admission  is sub-therapeutic and per MD notes she has been out of all her medications for months. Her heparin level on 1150 units/hr = 0.31 which is at the lower end of our goal.  Her CBC is stable with no bleeding reported.  She is still in Afib on EKG.    Goal of Therapy:  Heparin  level 0.3-0.7 units/ml Monitor platelets by anticoagulation protocol: Yes   Plan:  1.  Increase heparin to 1250 units/hr to ensure HL stays in range 2. Daily heparin level and CBC while on therapy.  MiEudelia BunchPharm.D. [id number]/08/2011 10:35 AM

## 2012-01-15 NOTE — Progress Notes (Signed)
I cosign for [facility] assessment, med administration, notes, I/O, and care plan/education.

## 2012-01-15 NOTE — Progress Notes (Signed)
Call to [facility] cardiology. Pt has had headache all day and nothing ordered is effective for relief. Pt states she has Hx of migraines from age 51.  Call returned- orders received from [facility]

## 2012-01-15 NOTE — Progress Notes (Signed)
TELEMETRY: Reviewed telemetry pt in atrial fib/flutter rate 105.: Filed Vitals:   01/15/12 0259 01/15/12 0539 01/15/12 0919 01/15/12 1019  BP: 128/88 117/77  112/78  Pulse: 78 104  90  Temp: 97.4 F (36.3 C) 97.5 F (36.4 C)    TempSrc: Oral Oral    Resp:    22  Height:      Weight:  102.241 kg (225 lb 6.4 oz)    SpO2: 97% 93% 98% 97%    Intake/Output Summary (Last 24 hours) at 01/15/12 1034 Last data filed at 01/15/12 1000  Gross per 24 hour  Intake 1895.28 ml  Output   3100 ml  Net -1204.72 ml    SUBJECTIVE Still SOB. Complains of HA this am. Has a history of migraines. Denies palpitations.  LABS: Basic Metabolic Panel:  Basename 01/15/12 0236 01/14/12 2058  NA 142 143  K 3.3* 3.5  CL 103 105  CO2 22 24  GLUCOSE 107* 100*  BUN 15 18  CREATININE 1.02 1.14*  CALCIUM 8.7 9.4  MG -- 2.1  PHOS -- --   Liver Function Tests:  [facility] 01/15/12 0236  AST 31  ALT 49*  ALKPHOS 83  BILITOT 0.7  PROT 6.6  ALBUMIN 3.8   No results found for this basename: LIPASE:2,AMYLASE:2 in the last 72 hours CBC:  Basename 01/15/12 0236 01/14/12 1705  WBC 7.6 7.8  NEUTROABS -- --  HGB 14.1 14.9  HCT 43.3 48.8*  MCV 86.1 89.8  PLT 182 --   Cardiac Enzymes:  Basename 01/15/12 0817 01/15/12 0236 01/14/12 2058  CKTOTAL -- -- --  CKMB -- -- --  CKMBINDEX -- -- --  TROPONINI <0.30 <0.30 <0.30   BNP: No components found with this basename: POCBNP:3 D-Dimer:  Basename 01/15/12 0620  DDIMER 0.28   Hemoglobin A1C:  Basename 01/14/12 2058  HGBA1C 6.1*   Fasting Lipid Panel: No results found for this basename: CHOL,HDL,LDLCALC,TRIG,CHOLHDL,LDLDIRECT in the last 72 hours Thyroid Function Tests:  [facility] 01/14/12 2058  TSH 4.798*  T4TOTAL --  T3FREE --  THYROIDAB --   Anemia Panel: No results found for this basename: VITAMINB12,FOLATE,FERRITIN,TIBC,IRON,RETICCTPCT in the last 72 hours  Radiology/Studies:  Dg Chest 2 View  01/14/2012  *RADIOLOGY REPORT*   Clinical Data: 51 year old female with shortness of breath and swelling.  CHEST - 2 VIEW  Comparison: None  Findings: Cardiomegaly is present. There is no evidence of focal airspace disease, pulmonary edema, suspicious pulmonary nodule/mass, pleural effusion, or pneumothorax. No acute bony abnormalities are identified. Mild elevation of the right hemidiaphragm is present.  IMPRESSION: Cardiomegaly without evidence of acute cardiopulmonary disease.   Original Report Authenticated By: Margarette Canada, M.D.     PHYSICAL EXAM General: Well developed, obese, in no acute distress. Head: Normocephalic, atraumatic, sclera non-icteric, no xanthomas, nares are without discharge. Neck: Negative for carotid bruits. JVD not elevated. Lungs: Clear bilaterally to auscultation without wheezes, rales, or rhonchi. Breathing is unlabored. Heart: RRR S1 S2 without murmurs, rubs, or gallops.  Abdomen: Soft, non-tender, non-distended with normoactive bowel sounds. No hepatomegaly. No rebound/guarding. No obvious abdominal masses. Msk:  Strength and tone appears normal for age. Extremities: 1-2+ edema.  Distal pedal pulses are 2+ and equal bilaterally. Neuro: Alert and oriented X 3. Moves all extremities spontaneously. Psych:  Responds to questions appropriately with a normal affect.  ASSESSMENT AND PLAN: 1. Atrial fibrillation/flutter with RVR. Rate control improved on IV cardizem. consider transition to po tomorrow. Flecainide resumed. On IV heparin. Echo pending.  2. Hypokalemia-  replete 3. Acute on chronic diastolic CHF. Diurese with IV lasix. 4. Anticoagulation. Choice of oral agent limited. She will not be able to afford novel agents unless she qualifies for drug assistance. Xarelto, Pradaxa, or Eliquis would be options. Patient states she cannot afford multiple copays with coumadin clinic and she also has significant transportation issues. Will ask case management to look into drug assistance programs 5. Headache.  Not a candidate for NSAIDS on anticoagulation. Will try Tylenol. 6. OSA   Active Problems:  OBSTRUCTIVE SLEEP APNEA  HTN (hypertension)  COPD (chronic obstructive pulmonary disease)  Atrial flutter with rapid ventricular response  Acute on chronic diastolic CHF (congestive heart failure), NYHA class 3    Signed, Peter Martinique MD,FACC 01/15/2012 10:42 AM

## 2012-01-15 NOTE — Progress Notes (Signed)
ANTICOAGULATION CONSULT NOTE - Follow Up Consult  Pharmacy Consult for heparin Indication: atrial fibrillation and h/o PE  Labs:  Basename 01/15/12 0236 01/14/12 2058 01/14/12 1705  HGB 14.1 -- 14.9  HCT 43.3 -- 48.8*  PLT 182 -- --  APTT -- -- --  LABPROT -- 13.1 --  INR -- 1.00 --  HEPARINUNFRC 0.50 -- --  CREATININE -- 1.14* --  CKTOTAL -- -- --  CKMB -- -- --  TROPONINI <0.30 <0.30 --    AssessmentPlan:  51yo female therapeutic on heparin with initial dosing for Afib/PE though level was drawn just 4hr after bolus.  Will continue gtt at current rate for now and confirm with next CE.  Rogue Bussing PharmD BCPS 01/15/2012,4:37 AM

## 2012-01-15 NOTE — Plan of Care (Signed)
Problem: Food- and Nutrition-Related Knowledge Deficit (NB-1.1) Goal: Nutrition education Formal process to instruct or train a patient/client in a skill or to impart knowledge to help patients/clients voluntarily manage or modify food choices and eating behavior to maintain or improve health. Outcome: Completed/Met Date Met:  01/15/12  Nutrition Education Note  RD consulted for nutrition education regarding CHF.  RD provided "Low Sodium Nutrition Therapy" handout from the Academy of [employer] and Dietetics. Reviewed patient's dietary recall. Provided examples on ways to decrease sodium intake in diet. Discouraged intake of processed foods and use of salt shaker. Encouraged fresh fruits and vegetables as well as whole grain sources of carbohydrates to maximize fiber intake.   RD discussed why it is important for patient to adhere to diet recommendations, and emphasized the role of fluids, foods to avoid, and importance of weighing self daily. Teach back method used.  Expect poor compliance.  Body mass index is 44.76 kg/(m^2). Pt meets criteria for Obese Class I based on current BMI.  Current diet order is Heart Healthy, patient is consuming approximately 100% of meals at this time. Labs and medications reviewed. No further nutrition interventions warranted at this time. RD contact information provided. If additional nutrition issues arise, please re-consult RD.   Inda Coke MS, RD, LDN Pager: [phone] After-hours pager: [phone]

## 2012-01-15 NOTE — Progress Notes (Signed)
Patient's potassium level was 3.3.  MD contacted.  RN instructed to give patient 40 mEq of potassium po once.  Will carry out orders.

## 2012-01-16 ENCOUNTER — Inpatient Hospital Stay (HOSPITAL_COMMUNITY): Payer: BC Managed Care – PPO

## 2012-01-16 DIAGNOSIS — G4733 Obstructive sleep apnea (adult) (pediatric): Secondary | ICD-10-CM

## 2012-01-16 LAB — CBC
HCT: 45.6 % (ref 36.0–46.0)
Hemoglobin: 14.6 g/dL (ref 12.0–15.0)
MCH: 28.3 pg (ref 26.0–34.0)
MCHC: 32 g/dL (ref 30.0–36.0)
MCV: 88.5 fL (ref 78.0–100.0)
Platelets: 209 10*3/uL (ref 150–400)
RBC: 5.15 MIL/uL — ABNORMAL HIGH (ref 3.87–5.11)
RDW: 14.8 % (ref 11.5–15.5)
WBC: 11.2 10*3/uL — ABNORMAL HIGH (ref 4.0–10.5)

## 2012-01-16 LAB — BASIC METABOLIC PANEL
BUN: 18 mg/dL (ref 6–23)
CO2: 29 mEq/L (ref 19–32)
Calcium: 9.1 mg/dL (ref 8.4–10.5)
Chloride: 99 mEq/L (ref 96–112)
Creatinine, Ser: 1.18 mg/dL — ABNORMAL HIGH (ref 0.50–1.10)
GFR calc Af Amer: 60 mL/min — ABNORMAL LOW (ref >90)
GFR calc non Af Amer: 52 mL/min — ABNORMAL LOW (ref >90)
Glucose, Bld: 138 mg/dL — ABNORMAL HIGH (ref 70–99)
Potassium: 3.8 mEq/L (ref 3.5–5.1)
Sodium: 140 mEq/L (ref 135–145)

## 2012-01-16 LAB — GLUCOSE, CAPILLARY: Glucose-Capillary: 153 mg/dL — ABNORMAL HIGH (ref 70–99)

## 2012-01-16 LAB — HEPARIN LEVEL (UNFRACTIONATED): Heparin Unfractionated: 0.55 IU/mL (ref 0.30–0.70)

## 2012-01-16 MED ORDER — DILTIAZEM HCL 25 MG/5ML IV SOLN
5.0000 mg | Freq: Once | INTRAVENOUS | Status: AC
  Administered 2012-01-16: 5 mg via INTRAVENOUS
  Filled 2012-01-16: qty 5

## 2012-01-16 MED ORDER — FLECAINIDE ACETATE 100 MG PO TABS
100.0000 mg | ORAL_TABLET | Freq: Two times a day (BID) | ORAL | Status: DC
  Administered 2012-01-16 – 2012-01-17 (×3): 100 mg via ORAL
  Filled 2012-01-16 (×4): qty 1

## 2012-01-16 NOTE — Progress Notes (Signed)
Called into patient's room by NT d/t patient Spo2 in 40's and patient being lethargic.  Placed patient on NRB and high fowlers.  Patient's Spo2 up to 92%, other vital signs WNL.  Patient is oriented x 4. Patient was asking for pain meds for migraine.  Told patient will continue to monitor her and reassess after notifying MD.  Md text paged-will continue to monitor. Filed Vitals:   01/16/12 0642  BP: 114/75  Pulse: 110  Temp: 98 F (36.7 C)  Resp: [address]

## 2012-01-16 NOTE — Progress Notes (Signed)
DANA DUNN NOTIFIED OF SITUATION, MD ON WAY TO ASSESS. WILL CONTINUE TO MONITOR. PATIENT REMOVED FROM NRB AND CURRENTLY ON O24L VIA NASAL CANNULA SPO2 MID-HIGH 90'S.  Tresa Endo

## 2012-01-16 NOTE — Progress Notes (Signed)
ANTICOAGULATION CONSULT NOTE - follow up Pharmacy Consult for Heparin Indication: atrial fibrillation and hx of PE (June 2012)  Allergies  Allergen Reactions  . Sulfonamide Derivatives Hives and Itching  Patient Measurements: Height: 4' 11.5" (151.1 cm) Weight: 225 lb 12 oz (102.4 kg) IBW/kg (Calculated) : 44.35  Heparin Dosing Weight: 70 kg Vital Signs: Temp: 98 F (36.7 C) (12/08 0753) Temp src: Oral (12/08 0753) BP: 118/80 mmHg (12/08 0909) Pulse Rate: 86  (12/08 0909) Labs:  Flo Shanks 01/16/12 0620 01/15/12 0817 01/15/12 0236 01/14/12 2058 01/14/12 1705  HGB 14.6 -- 14.1 -- --  HCT 45.6 -- 43.3 -- 48.8*  PLT 209 -- 182 -- --  APTT -- -- -- -- --  LABPROT -- -- -- 13.1 --  INR -- -- -- 1.00 --  HEPARINUNFRC 0.55 0.31 0.50 -- --  CREATININE 1.18* -- 1.02 1.14* --  CKTOTAL -- -- -- -- --  CKMB -- -- -- -- --  TROPONINI -- <0.30 <0.30 <0.30 --   Estimated Creatinine Clearance: 58.8 ml/min (by C-G formula based on Cr of 1.18).  Assessment: 18 YOF with known diastolic CHF, atrial fibrillation, and history of PE in June 2012 on chronic Coumadin previous to admission but stopped taking 2nd financial reasons.  She is now admitted with acute on chronic diastolic CHF and atrial fibrillation with RVR on  heparin per pharmacy dosing.   In June 2012, patient was therapeutic on IV heparin after a bolus of 4000 units and a rate of 1150 units/hr based on E-Chart progress notes.   Per office/phone notes from [facility] on 08/11/11, patient had stopped taking Coumadin because she could not afford the co-pays and was on ASA alone. Unsure if she had restarted- there were orders for her to resume 24m daily and 7.561mon Mondays and Fridays; however her INR on admission  is sub-therapeutic and per MD notes she has been out of all her medications for months.  Her heparin level on 1250 units/hr = 0.55 which is therapeutic.  Her CBC is stable with no bleeding reported.  She is still in Afib  on EKG.    Goal of Therapy:  Heparin level 0.3-0.7 units/ml Monitor platelets by anticoagulation protocol: Yes   Plan:  1. Continue heparin drip at  1250 units/hr  2. Daily heparin level and CBC while on therapy.  3. F/u plans for oral agent MiEudelia BunchPharm.D. [id number]/09/2011 1:53 PM

## 2012-01-16 NOTE — Progress Notes (Signed)
TELEMETRY: Reviewed telemetry pt in atrial fib/flutter rate 120-130.: Filed Vitals:   01/15/12 1345 01/15/12 2046 01/16/12 0043 01/16/12 0642  BP: 117/86 109/60 102/67 114/75  Pulse: 96 92 83 110  Temp: 97.3 F (36.3 C) 98.1 F (36.7 C)  98 F (36.7 C)  TempSrc: Oral Oral    Resp: _0 Height:      Weight:    102.4 kg (225 lb 12 oz)  SpO2: 94% 93% 92% 95%    Intake/Output Summary (Last 24 hours) at 01/16/12 0730 Last data filed at 01/16/12 0500  Gross per 24 hour  Intake 1880.75 ml  Output   2850 ml  Net -969.25 ml    SUBJECTIVE Patient received oxycodone 5 mg po and phenergan IV for migraine HA. Now very sedated. O2 sats dropped and patient was placed on nonrebreather. Now sats in 90s on 4 l [place]. Patient is arousable but very sedated. Still complains of HA. No N/V. Some SOB. Afib rate increased to 120-130 range.   LABS: Basic Metabolic Panel:  Basename 01/15/12 0236 01/14/12 2058  NA 142 143  K 3.3* 3.5  CL 103 105  CO2 22 24  GLUCOSE 107* 100*  BUN 15 18  CREATININE 1.02 1.14*  CALCIUM 8.7 9.4  MG -- 2.1  PHOS -- --   Liver Function Tests:  [facility] 01/15/12 0236  AST 31  ALT 49*  ALKPHOS 83  BILITOT 0.7  PROT 6.6  ALBUMIN 3.8   No results found for this basename: LIPASE:2,AMYLASE:2 in the last 72 hours CBC:  Basename 01/15/12 0236 01/14/12 1705  WBC 7.6 7.8  NEUTROABS -- --  HGB 14.1 14.9  HCT 43.3 48.8*  MCV 86.1 89.8  PLT 182 --   Cardiac Enzymes:  Basename 01/15/12 0817 01/15/12 0236 01/14/12 2058  CKTOTAL -- -- --  CKMB -- -- --  CKMBINDEX -- -- --  TROPONINI <0.30 <0.30 <0.30   BNP: No components found with this basename: POCBNP:3 D-Dimer:  Basename 01/15/12 0620  DDIMER 0.28   Hemoglobin A1C:  Basename 01/14/12 2058  HGBA1C 6.1*   Thyroid Function Tests:  Basename 01/14/12 2058  TSH 4.798*  T4TOTAL --  T3FREE --  THYROIDAB --    Radiology/Studies:  Dg Chest 2 View  01/14/2012  *RADIOLOGY REPORT*   Clinical Data: 51 year old female with shortness of breath and swelling.  CHEST - 2 VIEW  Comparison: None  Findings: Cardiomegaly is present. There is no evidence of focal airspace disease, pulmonary edema, suspicious pulmonary nodule/mass, pleural effusion, or pneumothorax. No acute bony abnormalities are identified. Mild elevation of the right hemidiaphragm is present.  IMPRESSION: Cardiomegaly without evidence of acute cardiopulmonary disease.   Original Report Authenticated By: Margarette Canada, M.D.     PHYSICAL EXAM General: Well developed, obese, very sedated but arousable. Head: Normocephalic, atraumatic, sclera non-icteric, no xanthomas, nares are without discharge. Neck: Negative for carotid bruits. JVD elevated but difficult to assess with body habitus. Lungs: bilateral rales. Breathing is unlabored. Heart: IRRR S1 S2 without murmurs, rubs, or gallops.  Abdomen: Soft, morbidly obese, non-distended with normoactive bowel sounds. No hepatomegaly. No rebound/guarding. No obvious abdominal masses. Msk:  Strength and tone appears normal for age. Extremities: 1+ edema.  Distal pedal pulses are 2+ and equal bilaterally. Neuro: Alert and oriented X 3. Moves all extremities spontaneously. Psych:  Responds to questions appropriately with a normal affect.  ASSESSMENT AND PLAN: 1. Atrial fibrillation/flutter with RVR. Rate control worse this am with respiratory compromise.  Flecainide resumed. On IV heparin. Echo pending. Will increase IV cardizem for better rate control. 2. Hypokalemia- replete 3. Acute on chronic diastolic CHF. Diurese with IV lasix. Will check CXR today. 4. Anticoagulation. Choice of oral agent limited. She will not be able to afford novel agents unless she qualifies for drug assistance. Xarelto, Pradaxa, or Eliquis would be options. Patient states she cannot afford multiple copays with coumadin clinic and she also has significant transportation issues. Will ask case management to  look into drug assistance programs. Ideally she would qualify for patient assistance with one of the novel agents. 5. Migraine HA. Over sedated with oxycodone and phenergan. Will hold narcotics. Use Zofran for nausea. Tylenol and tramadol only for HA. 6. Acute respiratory failure with hypoxia. I think this is primarily related to oversedation and worsening of hypoventilation/ OSA. Will check CXR. Avoid sedation. 7. OSA   Active Problems:  OBSTRUCTIVE SLEEP APNEA  HTN (hypertension)  COPD (chronic obstructive pulmonary disease)  Atrial flutter with rapid ventricular response  Acute on chronic diastolic CHF (congestive heart failure), NYHA class 3    Signed, Peter Martinique MD,FACC 01/16/2012 7:30 AM

## 2012-01-17 DIAGNOSIS — I4892 Unspecified atrial flutter: Secondary | ICD-10-CM

## 2012-01-17 LAB — CBC
HCT: 44.7 % (ref 36.0–46.0)
Hemoglobin: 14.1 g/dL (ref 12.0–15.0)
MCH: 28.3 pg (ref 26.0–34.0)
MCHC: 31.5 g/dL (ref 30.0–36.0)
MCV: 89.6 fL (ref 78.0–100.0)
Platelets: 201 10*3/uL (ref 150–400)
RBC: 4.99 MIL/uL (ref 3.87–5.11)
RDW: 14.6 % (ref 11.5–15.5)
WBC: 9.7 10*3/uL (ref 4.0–10.5)

## 2012-01-17 LAB — BASIC METABOLIC PANEL
BUN: 17 mg/dL (ref 6–23)
CO2: 26 mEq/L (ref 19–32)
Calcium: 9.1 mg/dL (ref 8.4–10.5)
Chloride: 98 mEq/L (ref 96–112)
Creatinine, Ser: 1 mg/dL (ref 0.50–1.10)
GFR calc Af Amer: 73 mL/min — ABNORMAL LOW (ref >90)
GFR calc non Af Amer: 63 mL/min — ABNORMAL LOW (ref >90)
Glucose, Bld: 147 mg/dL — ABNORMAL HIGH (ref 70–99)
Potassium: 4.1 mEq/L (ref 3.5–5.1)
Sodium: 137 mEq/L (ref 135–145)

## 2012-01-17 LAB — HEPARIN LEVEL (UNFRACTIONATED): Heparin Unfractionated: 0.54 IU/mL (ref 0.30–0.70)

## 2012-01-17 MED ORDER — DILTIAZEM HCL ER COATED BEADS 240 MG PO CP24
240.0000 mg | ORAL_CAPSULE | Freq: Every day | ORAL | Status: DC
  Administered 2012-01-17: 240 mg via ORAL
  Filled 2012-01-17 (×2): qty 1

## 2012-01-17 MED ORDER — SODIUM CHLORIDE 0.9 % IJ SOLN: 3.0000 mL | Freq: Two times a day (BID) | INTRAMUSCULAR | Status: DC

## 2012-01-17 MED ORDER — SODIUM CHLORIDE 0.9 % IV SOLN: 250.0000 mL | INTRAVENOUS | Status: DC

## 2012-01-17 MED ORDER — RIVAROXABAN 20 MG PO TABS
20.0000 mg | ORAL_TABLET | Freq: Every day | ORAL | Status: DC
  Administered 2012-01-17 – 2012-01-23 (×6): 20 mg via ORAL
  Filled 2012-01-17 (×7): qty 1

## 2012-01-17 MED ORDER — SODIUM CHLORIDE 0.9 % IJ SOLN: 3.0000 mL | INTRAMUSCULAR | Status: DC | PRN

## 2012-01-17 NOTE — Progress Notes (Signed)
  Echocardiogram 2D Echocardiogram has been performed.  [name] FRANCES 01/17/2012, 11:26 AM

## 2012-01-17 NOTE — Progress Notes (Signed)
ANTICOAGULATION CONSULT NOTE - follow up Pharmacy Consult for Heparin Indication: atrial fibrillation and hx of PE (June 2012)  Allergies  Allergen Reactions  . Sulfonamide Derivatives Hives and Itching  Patient Measurements: Height: 4' 11.5" (151.1 cm) Weight: 227 lb 8 oz (103.193 kg) (c scsle) IBW/kg (Calculated) : 44.35  Heparin Dosing Weight: 70 kg Vital Signs: Temp: 97.5 F (36.4 C) (12/09 0538) Temp src: Oral (12/09 0538) BP: 117/84 mmHg (12/09 0538) Pulse Rate: 88  (12/09 0538) Labs:  Flo Shanks 01/17/12 0610 01/16/12 0620 01/15/12 0817 01/15/12 0236 01/14/12 2058  HGB 14.1 14.6 -- -- --  HCT 44.7 45.6 -- 43.3 --  PLT 201 209 -- 182 --  APTT -- -- -- -- --  LABPROT -- -- -- -- 13.1  INR -- -- -- -- 1.00  HEPARINUNFRC 0.54 0.55 0.31 -- --  CREATININE 1.00 1.18* -- 1.02 --  CKTOTAL -- -- -- -- --  CKMB -- -- -- -- --  TROPONINI -- -- <0.30 <0.30 <0.30   Estimated Creatinine Clearance: 69.7 ml/min (by C-G formula based on Cr of 1).  Admit Complaint: 38 YOF with known diastolic CHF, atrial fibrillation, and history of PE in June 2012 on chronic Coumadin previous to admission but stopped taking 2nd financial reasons.  She is now admitted 01/14/2012  with acute on chronic diastolic CHF and atrial fibrillation with RVR on  heparin per pharmacy dosing.   Per office/phone notes from [facility] on 08/11/11, patient had stopped taking Coumadin because she could not afford the co-pays and was on ASA alone. Unsure if she had restarted- there were orders for her to resume 106m daily and 7.576mon Mondays and Fridays; however her INR on admission  is sub-therapeutic and per MD notes she has been out of all her medications for months.  Overnight Events: 01/17/2012 Sats dropped yesterday, but did not have any charted pain meds, says tylenol is not working but none charted, concerned that she may be taking meds from home.  Assessment: Anticoagulation: Afib, history PE, heparin level  on 1250 units/hr = 0.54 therapeutic.  Her CBC is stable with no bleeding reported.  She is still in Afib on EKG.   Infectious Disease: no ABX, afeb, WBC wnl  Cardiovascular: diastolic chf (EF 6007-62% afib, HTN, hyperlipidemia: Flecainide, ASA, furosemide 40 IV bid, K 40 daily (K4.1),dilt_0 /h;  80-100s, 110-120s/70-80s, 580/825 (-245) Dry Weight: TBD  Admit Weight: 228lb Current Weight: 227 lb 8 oz (103.193 kg)  Endocrinology: DM, no meds, glucose below goal  Gastrointestinal / Nutrition: GERD; Protonix  Neurology/MSK: Depression, Anxiety  Nephrology/Urology/Electrolytes: SCr 1.00, CrCl 67.7  Pulmonary: COPD: Spiriva, flonase  PTA Medication Issues: Home Meds Not Ordered: Flaxseed oil  Best Practices: DVT Prophylaxis:  IV heparin  Goal of Therapy:  Heparin level 0.3-0.7 units/ml Monitor platelets by anticoagulation protocol: Yes   Plan:  1. Continue heparin drip at  1250 units/hr  2. Daily heparin level and CBC while on therapy.  3. F/u plans for oral agent 4. Evaluate for compliance challenges, consider depression and benefit of initiation of antidepressant therapy and possibly beta blocker for the purpose of reducing incidence of migraines.   Thank you for allowing pharmacy to be a part of this patients care team.  JuRowe Robertharm.D., BCPS Clinical Pharmacist 01/17/2012 8:57 AM Pager: [phone]: [phone]

## 2012-01-17 NOTE — Progress Notes (Signed)
Patient continues to have c/o a severe migraine and requesting pain medication. Explained to patient numerous times that her narcotics have been discontinued and offered Tylenol-which patient refused stating that doesn't help migraines.  Patient also continues to be lethargic. She is currently on 02 5-6Liters nasal cannula and on continuous pulse ox machine.  Whenever patient takes off the oxygen her sats immediately drop into the low 80's.  Explained the importance of leaving oxygen on numerous times throughout the night.  At one point patient became very agitated that she was unable to receive narcotics for her headache and stated that she would just have her "family bring her something from home."  Patient informed that is not acceptable.  Patient currently resting, though she continues to have c/o pain. Will continue to monitor.  Tresa Endo

## 2012-01-17 NOTE — Progress Notes (Addendum)
Advanced Heart Failure Rounding Note   Subjective:    Ms. Tegtmeyer is a 51 y.o. female with a history of hypertension, hyperlipidemia, sleep apnea, glucose intolerance, COPD and GERD as well as paroxysmal atrial fibrillation. She had done well on flecainide in the past. She failed Tikosyn due to prolonged QT.   Admitted 12/6 with Afib with RVR (NSR 09/23/10) and volume overload.  IV diltiazem used with improvement in rate (80-90s).  On IV heparin.  Flecainide restarted.  C/o migraine.  Given oxy and phenergan with lethargy and desat into 80s requiring NRB.    Continues to c/o headache.  Mild SOB.  Echo pending.    Now in AFL on tele with rates 80-100  Objective:     Vital Signs:   Temp:  [97.4 F (36.3 C)-98.1 F (36.7 C)] 97.5 F (36.4 C) (12/09 0538) Pulse Rate:  [86-99] 88  (12/09 0538) Resp:  [18-20] 19  (12/09 0538) BP: (115-129)/(70-84) 117/84 mmHg (12/09 0538) SpO2:  [94 %-99 %] 99 % (12/09 0538) Weight:  [103.193 kg (227 lb 8 oz)] 103.193 kg (227 lb 8 oz) (12/09 0538) Last BM Date: 01/14/12  Weight change: Filed Weights   01/15/12 0539 01/16/12 0642 01/17/12 0538  Weight: 102.241 kg (225 lb 6.4 oz) 102.4 kg (225 lb 12 oz) 103.193 kg (227 lb 8 oz)    Intake/Output:   Intake/Output Summary (Last 24 hours) at 01/17/12 0815 Last data filed at 01/17/12 0750  Gross per 24 hour  Intake 1346.88 ml  Output    825 ml  Net 521.88 ml     Physical Exam: General:  Mildly sedated but arousable. No resp difficulty, obese HEENT: normal Neck: supple. JVP hard to assess . Carotids 2+ bilat; no bruits. No lymphadenopathy or thryomegaly appreciated. Cor: PMI nondisplaced. Irregular rate & rhythm. No rubs, gallops or murmurs. Lungs: clear Abdomen: obese, soft, nontender, nondistended. No hepatosplenomegaly. No bruits or masses. Good bowel sounds. Extremities: no cyanosis, clubbing, rash, trace edema Neuro: alert & orientedx3, cranial nerves grossly intact. moves all 4  extremities w/o difficulty. Affect pleasant  Telemetry: AFL 80-90s  Labs: Basic Metabolic Panel:  Lab 30/86/57 0610 01/16/12 0620 01/15/12 0236 01/14/12 2058  NA 137 140 142 143  K 4.1 3.8 3.3* 3.5  CL 98 99 103 105  CO2 _0 GLUCOSE 147* 138* 107* 100*  BUN _1 CREATININE 1.00 1.18* 1.02 1.14*  CALCIUM 9.1 9.1 8.7 --  MG -- -- -- 2.1  PHOS -- -- -- --    Liver Function Tests:  Lab 01/15/12 0236  AST 31  ALT 49*  ALKPHOS 83  BILITOT 0.7  PROT 6.6  ALBUMIN 3.8   No results found for this basename: LIPASE:5,AMYLASE:5 in the last 168 hours No results found for this basename: AMMONIA:3 in the last 168 hours  CBC:  Lab 01/17/12 0610 01/16/12 0620 01/15/12 0236 01/14/12 1705  WBC 9.7 11.2* 7.6 7.8  NEUTROABS -- -- -- --  HGB 14.1 14.6 14.1 14.9  HCT 44.7 45.6 43.3 48.8*  MCV 89.6 88.5 86.1 89.8  PLT 201 209 182 --    Cardiac Enzymes:  Lab 01/15/12 0817 01/15/12 0236 01/14/12 2058  CKTOTAL -- -- --  CKMB -- -- --  CKMBINDEX -- -- --  TROPONINI <0.30 <0.30 <0.30    BNP: BNP (last 3 results)  Basename 01/14/12 2058  PROBNP 1845.0*     Other results:  EKG:   Imaging: Dg Chest 2  View  01/16/2012  *RADIOLOGY REPORT*  Clinical Data: Shortness of breath.  CHEST - 2 VIEW  Comparison: 07/15/2010.  Findings: The heart is enlarged.  The mediastinal and hilar contours are stable.  There is vascular congestion and peribronchial thickening but no overt pulmonary edema.  No pleural effusions.  The bony thorax is intact.  IMPRESSION: Cardiac enlargement and vascular congestion without overt pulmonary edema.   Original Report Authenticated By: Marijo Sanes, M.D.       Medications:     Scheduled Medications:    . aspirin EC  81 mg Oral Daily  . flecainide  100 mg Oral Q12H  . fluticasone  2 spray Each Nare Daily  . furosemide  40 mg Intravenous BID  . loratadine  10 mg Oral Daily  . pantoprazole  40 mg Oral Daily  . potassium chloride  40  mEq Oral Daily  . sodium chloride  3 mL Intravenous Q12H  . tiotropium  18 mcg Inhalation Daily     Infusions:    . diltiazem (CARDIZEM) infusion 15 mg/hr (01/17/12 0153)  . heparin 1,250 Units/hr (01/17/12 0737)     PRN Medications:  sodium chloride, acetaminophen, albuterol, ALPRAZolam, nitroGLYCERIN, ondansetron (ZOFRAN) IV, sodium chloride, traMADol, zolpidem   Assessment:   1. Atrial fibrillation/AFL with RVR     - rate controlled on IV diltiazem 2. Acute on chronic diastolic HF 3. Hypertension 4. OSA     - sleep study scheduled for outpatient setting but never followed up 5. COPD 6. Morbid obesity  Plan/Discussion:    She remains in afib/AFL but with better rate control on Cardizem drip.  She has had good response to flecainide and beta blockers in the past but had been out of her medication due to financial constraints.  Flecainide has been restarted but remains in atrial fibrillation.  There was discussion in the past about possible ablation.  Can discuss with EP.  Will continue heparin now.  She is not compliant with follow up therefore coumadin would not be best option.  Will discuss NOAC with case management.    Continue IV lasix for mild fluid overload in setting of afib with RVR.  Will discuss migraine options with pharmacy.  Length of Stay: [place], [facility] 01/17/2012, 8:15 AM  Patient seen and examined with [name], PA-C. We discussed all aspects of the encounter. I agree with the assessment and plan as stated above.   Very difficult situation. She is currently in AFL with good rate control on IV cardizem. Will switch to po. Given duration of AF/AFL would stop flecainide as to minimize risk of chemical cardioversion prior to TEE.   She has severe non-compliance which she attributes to inability to afford her meds. We stressed the need to be anti-coagulated for at least 4 weeks after TEE and DC-CV. We had long discussion with her and our pharmacy  team about best approach.  Will switch IV cardizem to po. Stop flecainide for now and restart after DC-CV. Start Xarelto today and plan TEE and DC-CV on Wednesday (3 days steady state). Will continue lasix one more day.  After DC-CV may benefit from switching cardizem back to b-blocker to also help with migraines.  If fails flecainide can consider ablation per Dr. Lovena Le.  Daniel Bensimhon,MD 1:28 PM   Addendum: I have gone through all her ECGs in Epic and they are all AFL or NSR. I do not see any atrial fib recently - although I suspect she did have  in the past. Given that AFL has been her predominant atrial arrhythmia will ask EP about the possibility of AFL ablation.   Daniel Bensimhon,MD 1:33 PM

## 2012-01-18 LAB — CBC
HCT: 43.9 % (ref 36.0–46.0)
Hemoglobin: 13.7 g/dL (ref 12.0–15.0)
MCH: 28 pg (ref 26.0–34.0)
MCHC: 31.2 g/dL (ref 30.0–36.0)
MCV: 89.6 fL (ref 78.0–100.0)
Platelets: 193 10*3/uL (ref 150–400)
RBC: 4.9 MIL/uL (ref 3.87–5.11)
RDW: 14.4 % (ref 11.5–15.5)
WBC: 6.9 10*3/uL (ref 4.0–10.5)

## 2012-01-18 LAB — BASIC METABOLIC PANEL
BUN: 15 mg/dL (ref 6–23)
CO2: 36 mEq/L — ABNORMAL HIGH (ref 19–32)
Calcium: 8.6 mg/dL (ref 8.4–10.5)
Chloride: 98 mEq/L (ref 96–112)
Creatinine, Ser: 0.95 mg/dL (ref 0.50–1.10)
GFR calc Af Amer: 78 mL/min — ABNORMAL LOW (ref >90)
GFR calc non Af Amer: 67 mL/min — ABNORMAL LOW (ref >90)
Glucose, Bld: 115 mg/dL — ABNORMAL HIGH (ref 70–99)
Potassium: 3.7 mEq/L (ref 3.5–5.1)
Sodium: 141 mEq/L (ref 135–145)

## 2012-01-18 MED ORDER — DILTIAZEM HCL ER COATED BEADS 360 MG PO CP24
360.0000 mg | ORAL_CAPSULE | Freq: Every day | ORAL | Status: DC
  Administered 2012-01-18: 360 mg via ORAL
  Filled 2012-01-18 (×2): qty 1

## 2012-01-18 MED ORDER — POTASSIUM CHLORIDE CRYS ER 20 MEQ PO TBCR
40.0000 meq | EXTENDED_RELEASE_TABLET | Freq: Two times a day (BID) | ORAL | Status: DC
Start: 2012-01-18 — End: 2012-01-23
  Administered 2012-01-18 – 2012-01-23 (×9): 40 meq via ORAL
  Filled 2012-01-18 (×11): qty 2

## 2012-01-18 MED ORDER — FUROSEMIDE 10 MG/ML IJ SOLN
80.0000 mg | Freq: Two times a day (BID) | INTRAMUSCULAR | Status: DC
  Administered 2012-01-18: 80 mg via INTRAVENOUS
  Filled 2012-01-18 (×2): qty 8

## 2012-01-18 MED ORDER — CITALOPRAM HYDROBROMIDE 10 MG PO TABS
10.0000 mg | ORAL_TABLET | Freq: Every day | ORAL | Status: DC
  Administered 2012-01-18 – 2012-01-23 (×5): 10 mg via ORAL
  Filled 2012-01-18 (×7): qty 1

## 2012-01-18 NOTE — Care Management Note (Signed)
    Page 1 of 1   01/18/2012     3:55:39 PM   CARE MANAGEMENT NOTE 01/18/2012  Patient:  [name], [name]   Account Number:  [id number]  Date Initiated:  01/18/2012  Documentation initiated by:  Llana Aliment  Subjective/Objective Assessment:   51yo female admitted with CHF.  Pt. lives at home with children.     Action/Plan:   Pt. has not had her medications in over a year.  Pt. currently in AFIB, and CHF.   Anticipated DC Date:  01/20/2012   Anticipated DC Plan:  [place]  CM consult  Medication Assistance      Choice offered to / List presented to:             Status of service:  In process, will continue to follow Medicare Important Message given?   (If response is "NO", the following Medicare IM given date fields will be blank) Date Medicare IM given:   Date Additional Medicare IM given:    Discharge Disposition:    Per UR Regulation:  Reviewed for med. necessity/level of care/duration of stay  If discussed at [place] of Stay Meetings, dates discussed:    Comments:  01/18/12 1550 Pt. is eligible for Patient Assistance for Xarelto. Placed paperwork on shadow chart for Dr. Haroldine Laws to sign.  Unable to obtain any pt. assistance for Flecainide.  Will continue to search.  Pt. to have TEE tomorrow.  NCM to follow for [facility] needs on dc. Llana Aliment, RN, BSN NCM [phone]

## 2012-01-18 NOTE — Progress Notes (Signed)
UR Completed.  Vergie Living [phone] 01/18/2012

## 2012-01-18 NOTE — Progress Notes (Signed)
Advanced Heart Failure Rounding Note   Subjective:    [name] is a 51 y.o. female with a history of hypertension, hyperlipidemia, sleep apnea, glucose intolerance, COPD and GERD as well as paroxysmal atrial fibrillation. She had done well on flecainide in the past. She failed Tikosyn due to prolonged QT.   Admitted 12/6 with Afib with RVR (NSR 09/23/10) and volume overload.  IV diltiazem used with improvement in rate (80-90s).  On IV heparin.  Flecainide restarted.  C/o migraine.  Given oxy and phenergan with lethargy and desat into 80s requiring NRB.    Atrial fib 100-120.  C/o HA.  No SOB, orthopnea, PND.  Mild lower extremity edema.    Objective:     Vital Signs:   Temp:  [98.1 F (36.7 C)-98.4 F (36.9 C)] 98.4 F (36.9 C) (12/10 0526) Pulse Rate:  [96-107] 99  (12/10 0526) Resp:  [18-20] 20  (12/10 0526) BP: (114-139)/(72-90) 139/90 mmHg (12/10 0526) SpO2:  [96 %-100 %] 96 % (12/10 0526) Last BM Date: 01/14/12  Weight change: Filed Weights   01/15/12 0539 01/16/12 0642 01/17/12 0538  Weight: 102.241 kg (225 lb 6.4 oz) 102.4 kg (225 lb 12 oz) 103.193 kg (227 lb 8 oz)    Intake/Output:   Intake/Output Summary (Last 24 hours) at 01/18/12 0803 Last data filed at 01/18/12 0600  Gross per 24 hour  Intake 654.67 ml  Output   1850 ml  Net -1195.33 ml     Physical Exam: General:  Mildly sedated but arousable. No resp difficulty, obese HEENT: normal Neck: supple. JVP hard to assess . Carotids 2+ bilat; no bruits. No lymphadenopathy or thryomegaly appreciated. Cor: PMI nondisplaced. Irregular rate & rhythm. No rubs, gallops or murmurs. Lungs: clear Abdomen: obese, soft, nontender, nondistended. No hepatosplenomegaly. No bruits or masses. Good bowel sounds. Extremities: no cyanosis, clubbing, rash, 1+ edema Neuro: alert & orientedx3, cranial nerves grossly intact. moves all 4 extremities w/o difficulty. Affect pleasant  Telemetry: AFL 100-120s  Labs: Basic Metabolic  Panel:  Lab 01/18/12 0610 01/17/12 0610 01/16/12 0620 01/15/12 0236 01/14/12 2058  NA 141 137 140 142 143  K 3.7 4.1 3.8 3.3* 3.5  CL 98 98 99 103 105  CO2 36* 26 29 22 24  GLUCOSE 115* 147* 138* 107* 100*  BUN 15 17 18 15 18  CREATININE 0.95 1.00 1.18* 1.02 1.14*  CALCIUM 8.6 9.1 9.1 -- --  MG -- -- -- -- 2.1  PHOS -- -- -- -- --    Liver Function Tests:  Lab 01/15/12 0236  AST 31  ALT 49*  ALKPHOS 83  BILITOT 0.7  PROT 6.6  ALBUMIN 3.8   No results found for this basename: LIPASE:5,AMYLASE:5 in the last 168 hours No results found for this basename: AMMONIA:3 in the last 168 hours  CBC:  Lab 01/18/12 0610 01/17/12 0610 01/16/12 0620 01/15/12 0236 01/14/12 1705  WBC 6.9 9.7 11.2* 7.6 7.8  NEUTROABS -- -- -- -- --  HGB 13.7 14.1 14.6 14.1 14.9  HCT 43.9 44.7 45.6 43.3 48.8*  MCV 89.6 89.6 88.5 86.1 89.8  PLT 193 201 209 182 --    Cardiac Enzymes:  Lab 01/15/12 0817 01/15/12 0236 01/14/12 2058  CKTOTAL -- -- --  CKMB -- -- --  CKMBINDEX -- -- --  TROPONINI <0.30 <0.30 <0.30    BNP: BNP (last 3 results)  Basename 01/14/12 2058  PROBNP 1845.0*     Imaging: Dg Chest 2 View  01/16/2012  *RADIOLOGY REPORT*    Clinical Data: Shortness of breath.  CHEST - 2 VIEW  Comparison: 07/15/2010.  Findings: The heart is enlarged.  The mediastinal and hilar contours are stable.  There is vascular congestion and peribronchial thickening but no overt pulmonary edema.  No pleural effusions.  The bony thorax is intact.  IMPRESSION: Cardiac enlargement and vascular congestion without overt pulmonary edema.   Original Report Authenticated By: P. Gallerani, M.D.      Medications:     Scheduled Medications:    . aspirin EC  81 mg Oral Daily  . diltiazem  240 mg Oral Daily  . fluticasone  2 spray Each Nare Daily  . furosemide  40 mg Intravenous BID  . loratadine  10 mg Oral Daily  . pantoprazole  40 mg Oral Daily  . potassium chloride  40 mEq Oral Daily  . rivaroxaban  20  mg Oral Daily  . sodium chloride  3 mL Intravenous Q12H  . tiotropium  18 mcg Inhalation Daily  . [DISCONTINUED] flecainide  100 mg Oral Q12H  . [DISCONTINUED] sodium chloride  3 mL Intravenous Q12H    Infusions:    . [DISCONTINUED] sodium chloride    . [DISCONTINUED] diltiazem (CARDIZEM) infusion 15 mg/hr (01/17/12 0153)  . [DISCONTINUED] heparin 1,250 Units/hr (01/17/12 0737)    PRN Medications: sodium chloride, acetaminophen, albuterol, ALPRAZolam, nitroGLYCERIN, ondansetron (ZOFRAN) IV, sodium chloride, traMADol, zolpidem, [DISCONTINUED] sodium chloride   Assessment:   1. Atrial fibrillation/AFL with RVR     - rate controlled on diltiazem 2. Acute on chronic diastolic HF 3. Hypertension 4. OSA     - sleep study scheduled for outpatient setting but never followed up 5. COPD 6. Morbid obesity 7. Hypokalemia  Plan/Discussion:    Remains in Afib with rates increased 100-120 today.  She was transitioned to oral diltiazem yesterday.  Increase diltiazem to 360 mg daily.  Will plan for TEE/DCCV tomorrow.  Xarelto started yesterday (3 days steady state by Wed).  Dr. Klein reviewed tracings and felt more atrial fib than flutter so will not proceed with ablation at this time.  Can restart flecainide after DCCV.    Increase lasix 80 mg IV BID.  Follow renal function closely.    Length of Stay: 4  Robin Bradley, PAC 01/18/2012, 8:03 AM  Patient seen and examined with Nicki Bradley, PA-C. We discussed all aspects of the encounter. I agree with the assessment and plan as stated above.   She remains in AF with RVR. Agree with increasing diltiazem. Will plan for TEE-DC-CV tomorrow after 3rd dose of Xarelto. She had extensive meeting with our pharmacy team today about getting her meds and being compliant. She understands need to be compliant with anticoagulation for at least a full, uninterrupted month of anti-coagulation post- DC-CV. Will restart flecainide tomorrow as well.  Volume  status worse today. Agree with increasing lasix. Renal function stable.  Robin Hammer,MD 3:01 PM     

## 2012-01-19 ENCOUNTER — Encounter (HOSPITAL_COMMUNITY)
Admission: AD | Disposition: A | Discharge status: Home / Self Care | Payer: Self-pay | Source: Ambulatory Visit | Attending: Cardiovascular Disease

## 2012-01-19 ENCOUNTER — Encounter (HOSPITAL_COMMUNITY): Payer: Self-pay | Admitting: Critical Care Medicine

## 2012-01-19 ENCOUNTER — Inpatient Hospital Stay (HOSPITAL_COMMUNITY): Payer: BC Managed Care – PPO | Admitting: Critical Care Medicine

## 2012-01-19 ENCOUNTER — Encounter (HOSPITAL_COMMUNITY): Payer: Self-pay

## 2012-01-19 DIAGNOSIS — I4891 Unspecified atrial fibrillation: Secondary | ICD-10-CM

## 2012-01-19 HISTORY — PX: TEE WITHOUT CARDIOVERSION: SHX5443

## 2012-01-19 HISTORY — PX: CARDIOVERSION: SHX1299

## 2012-01-19 LAB — CBC
HCT: 43.1 % (ref 36.0–46.0)
Hemoglobin: 13.6 g/dL (ref 12.0–15.0)
MCH: 28 pg (ref 26.0–34.0)
MCHC: 31.6 g/dL (ref 30.0–36.0)
MCV: 88.7 fL (ref 78.0–100.0)
Platelets: 208 10*3/uL (ref 150–400)
RBC: 4.86 MIL/uL (ref 3.87–5.11)
RDW: 13.8 % (ref 11.5–15.5)
WBC: 7.4 10*3/uL (ref 4.0–10.5)

## 2012-01-19 LAB — BASIC METABOLIC PANEL
BUN: 13 mg/dL (ref 6–23)
CO2: 40 mEq/L (ref 19–32)
Calcium: 8.6 mg/dL (ref 8.4–10.5)
Chloride: 92 mEq/L — ABNORMAL LOW (ref 96–112)
Creatinine, Ser: 0.92 mg/dL (ref 0.50–1.10)
GFR calc Af Amer: 81 mL/min — ABNORMAL LOW (ref >90)
GFR calc non Af Amer: 70 mL/min — ABNORMAL LOW (ref >90)
Glucose, Bld: 116 mg/dL — ABNORMAL HIGH (ref 70–99)
Potassium: 3.5 mEq/L (ref 3.5–5.1)
Sodium: 139 mEq/L (ref 135–145)

## 2012-01-19 SURGERY — ECHOCARDIOGRAM, TRANSESOPHAGEAL
Anesthesia: General

## 2012-01-19 MED ORDER — SODIUM CHLORIDE 0.9 % IV SOLN: 250.0000 mL | INTRAVENOUS | Status: DC

## 2012-01-19 MED ORDER — SODIUM CHLORIDE 0.9 % IJ SOLN: 3.0000 mL | INTRAMUSCULAR | Status: DC | PRN

## 2012-01-19 MED ORDER — FENTANYL CITRATE 0.05 MG/ML IJ SOLN
INTRAMUSCULAR | Status: DC | PRN
  Administered 2012-01-19 (×2): 25 ug via INTRAVENOUS

## 2012-01-19 MED ORDER — SODIUM CHLORIDE 0.9 % IJ SOLN: 3.0000 mL | Freq: Two times a day (BID) | INTRAMUSCULAR | Status: DC

## 2012-01-19 MED ORDER — MIDAZOLAM HCL 5 MG/ML IJ SOLN
INTRAMUSCULAR | Status: AC
  Filled 2012-01-19: qty 2

## 2012-01-19 MED ORDER — MIDAZOLAM HCL 10 MG/2ML IJ SOLN
INTRAMUSCULAR | Status: DC | PRN
  Administered 2012-01-19 (×2): 2 mg via INTRAVENOUS

## 2012-01-19 MED ORDER — FENTANYL CITRATE 0.05 MG/ML IJ SOLN
INTRAMUSCULAR | Status: AC
  Filled 2012-01-19: qty 2

## 2012-01-19 MED ORDER — FUROSEMIDE 40 MG PO TABS
40.0000 mg | ORAL_TABLET | Freq: Two times a day (BID) | ORAL | Status: DC
  Administered 2012-01-19 – 2012-01-23 (×8): 40 mg via ORAL
  Filled 2012-01-19 (×10): qty 1

## 2012-01-19 MED ORDER — FLECAINIDE ACETATE 50 MG PO TABS
150.0000 mg | ORAL_TABLET | Freq: Two times a day (BID) | ORAL | Status: DC
  Administered 2012-01-19: 150 mg via ORAL
  Filled 2012-01-19 (×2): qty 1

## 2012-01-19 MED ORDER — AMIODARONE HCL IN DEXTROSE 360-4.14 MG/200ML-% IV SOLN
60.0000 mg/h | INTRAVENOUS | Status: AC
  Administered 2012-01-19: 60 mg/h via INTRAVENOUS
  Filled 2012-01-19: qty 200

## 2012-01-19 MED ORDER — PROPOFOL 10 MG/ML IV BOLUS
INTRAVENOUS | Status: DC | PRN
  Administered 2012-01-19: 40 mg via INTRAVENOUS

## 2012-01-19 MED ORDER — BUTAMBEN-TETRACAINE-BENZOCAINE 2-2-14 % EX AERO
INHALATION_SPRAY | CUTANEOUS | Status: DC | PRN
  Administered 2012-01-19: 2 via TOPICAL

## 2012-01-19 MED ORDER — SODIUM CHLORIDE 0.9 % IV SOLN
INTRAVENOUS | Status: DC
  Administered 2012-01-19: 20 mL/h via INTRAVENOUS

## 2012-01-19 MED ORDER — AMIODARONE HCL IN DEXTROSE 360-4.14 MG/200ML-% IV SOLN
30.0000 mg/h | INTRAVENOUS | Status: DC
  Administered 2012-01-19: 30 mg/h via INTRAVENOUS
  Filled 2012-01-19 (×5): qty 200

## 2012-01-19 MED ORDER — LIDOCAINE HCL (CARDIAC) 20 MG/ML IV SOLN
INTRAVENOUS | Status: DC | PRN
  Administered 2012-01-19: 60 mg via INTRAVENOUS

## 2012-01-19 MED ORDER — AMIODARONE LOAD VIA INFUSION
150.0000 mg | Freq: Once | INTRAVENOUS | Status: AC
  Administered 2012-01-19: 150 mg via INTRAVENOUS
  Filled 2012-01-19: qty 83.34

## 2012-01-19 NOTE — CV Procedure (Addendum)
    TRANSESOPHAGEAL ECHOCARDIOGRAM GUIDED DIRECT CURRENT CARDIOVERSION  NAME:  [name]   MRN: [id number] DOB:  February 17, 1960   ADMIT DATE: 01/14/2012  INDICATIONS: AF with RVR   PROCEDURE:   Informed consent was obtained prior to the procedure. The risks, benefits and alternatives for the procedure were discussed and the patient comprehended these risks.  Risks include, but are not limited to, cough, sore throat, vomiting, nausea, somnolence, esophageal and stomach trauma or perforation, bleeding, low blood pressure, aspiration, pneumonia, infection, trauma to the teeth and death.    After a procedural time-out, the patient was given 4 mg versed and 50 mcg fentanyl for moderate sedation.  The oropharynx was anesthetized 10 cc of topical 1% viscous lidocaine.  The transesophageal probe was inserted in the esophagus and stomach without difficulty and multiple views were obtained.     FINDINGS:  LEFT VENTRICLE: EF = 60% no regional wall motion abnormalities  RIGHT VENTRICLE: Normal size. Mildly hypokinetic.  LEFT ATRIUM: Moderately dilated  LEFT ATRIAL APPENDAGE: Mo clot  RIGHT ATRIUM: Normal  AORTIC VALVE:  Trileaflet. Mildly thickened. No AI/AS  MITRAL VALVE:    Dilated annulus. Moderate central MR  TRICUSPID VALVE: Mild to moderate TR  PULMONIC VALVE: Trivial PR  INTERATRIAL SEPTUM: No ASD or PFO  PERICARDIUM: Trivial effusion  DESCENDING AORTA:   CARDIOVERSION:     Indications:  Atrial Fibrillation  Procedure Details:  Once the TEE was complete, the patient had the defibrillator pads placed in the anterior and posterior position. The patient then underwent further sedation by the anesthesia service for cardioversion. Once an appropriate level of sedation was achieved, the patient received a single biphasic, synchronized 200J shock without effect,. She then underwent a second shock with manual pressure held on the anterior pad with several beats of SR prior to  returning to AF.    COMPLICATIONS:    There were no immediate complications.   CONCLUSION:   1.  Unsuccessful TEE guided cardioversion. Will load flecainide and repeat DC-CV in 24-48 hours. Will d/w EP whether or not we should consider amiodarone instead.   Daniel Bensimhon,MD 10:20 AM

## 2012-01-19 NOTE — Progress Notes (Signed)
Advanced Heart Failure Rounding Note   Subjective:    [name] is a 51 y.o. female with a history of hypertension, hyperlipidemia, sleep apnea, glucose intolerance, COPD and GERD as well as paroxysmal atrial fibrillation. She had done well on flecainide in the past. She failed Tikosyn due to prolonged QT.   Admitted 12/6 with Afib with RVR (NSR 09/23/10) and volume overload.  IV diltiazem used with improvement in rate (80-90s).  Placed on Xarelto.  Underwent attempted DC-CV today but failed. Remains in AF rates 100-120. Diuresing well. Denies CP or SOB.    Objective:     Vital Signs:   Temp:  [97.1 F (36.2 C)-98.5 F (36.9 C)] 97.1 F (36.2 C) (12/11 1432) Pulse Rate:  [93-103] 101  (12/11 0827) Resp:  [16-31] 22  (12/11 1432) BP: (106-146)/(64-89) 106/64 mmHg (12/11 1432) SpO2:  [85 %-100 %] 88 % (12/11 1432) Weight:  [97.6 kg (215 lb 2.7 oz)] 97.6 kg (215 lb 2.7 oz) (12/11 0441) Last BM Date: 01/14/12  Weight change: Filed Weights   01/16/12 0642 01/17/12 0538 01/19/12 0441  Weight: 102.4 kg (225 lb 12 oz) 103.193 kg (227 lb 8 oz) 97.6 kg (215 lb 2.7 oz)    Intake/Output:   Intake/Output Summary (Last 24 hours) at 01/19/12 1601 Last data filed at 01/19/12 0903  Gross per 24 hour  Intake    595 ml  Output   2500 ml  Net  -1905 ml     Physical Exam: General:  No acute distress. HEENT: normal Neck: supple. JVP hard to assess . Carotids 2+ bilat; no bruits. No lymphadenopathy or thryomegaly appreciated. Cor: PMI nondisplaced. Irregular rate & rhythm. No rubs, gallops or murmurs. Lungs: clear Abdomen: obese, soft, nontender, nondistended. No hepatosplenomegaly. No bruits or masses. Good bowel sounds. Extremities: no cyanosis, clubbing, rash, no edema Neuro: alert & orientedx3, cranial nerves grossly intact. moves all 4 extremities w/o difficulty. Affect pleasant  Telemetry: AFL 90-110  Labs: Basic Metabolic Panel:  Lab 03/79/55 0535 01/18/12 0610 01/17/12  0610 01/16/12 0620 01/15/12 0236 01/14/12 2058  NA 139 141 137 140 142 --  K 3.5 3.7 4.1 3.8 3.3* --  CL 92* 98 98 99 103 --  CO2 40* 36* _0 --  GLUCOSE 116* 115* 147* 138* 107* --  BUN _1 --  CREATININE 0.92 0.95 1.00 1.18* 1.02 --  CALCIUM 8.6 8.6 9.1 -- -- --  MG -- -- -- -- -- 2.1  PHOS -- -- -- -- -- --    Liver Function Tests:  Lab 01/15/12 0236  AST 31  ALT 49*  ALKPHOS 83  BILITOT 0.7  PROT 6.6  ALBUMIN 3.8   No results found for this basename: LIPASE:5,AMYLASE:5 in the last 168 hours No results found for this basename: AMMONIA:3 in the last 168 hours  CBC:  Lab 01/19/12 0535 01/18/12 0610 01/17/12 0610 01/16/12 0620 01/15/12 0236  WBC 7.4 6.9 9.7 11.2* 7.6  NEUTROABS -- -- -- -- --  HGB 13.6 13.7 14.1 14.6 14.1  HCT 43.1 43.9 44.7 45.6 43.3  MCV 88.7 89.6 89.6 88.5 86.1  PLT 208 193 201 209 182    Cardiac Enzymes:  Lab 01/15/12 0817 01/15/12 0236 01/14/12 2058  CKTOTAL -- -- --  CKMB -- -- --  CKMBINDEX -- -- --  TROPONINI <0.30 <0.30 <0.30    BNP: BNP (last 3 results)  [facility] 01/14/12 2058  PROBNP 1845.0*     Imaging: No results  found.   Medications:     Scheduled Medications:    . aspirin EC  81 mg Oral Daily  . citalopram  10 mg Oral Daily  . diltiazem  360 mg Oral Daily  . flecainide  150 mg Oral Q12H  . fluticasone  2 spray Each Nare Daily  . furosemide  80 mg Intravenous BID  . loratadine  10 mg Oral Daily  . pantoprazole  40 mg Oral Daily  . potassium chloride  40 mEq Oral BID  . rivaroxaban  20 mg Oral Daily  . sodium chloride  3 mL Intravenous Q12H  . sodium chloride  3 mL Intravenous Q12H  . tiotropium  18 mcg Inhalation Daily    Infusions:    . sodium chloride    . [DISCONTINUED] sodium chloride 20 mL/hr (01/19/12 0529)    PRN Medications: sodium chloride, acetaminophen, albuterol, ALPRAZolam, nitroGLYCERIN, ondansetron (ZOFRAN) IV, sodium chloride, sodium chloride, traMADol, zolpidem,  [DISCONTINUED] butamben-tetracaine-benzocaine, [DISCONTINUED] fentaNYL, [DISCONTINUED] midazolam   Assessment:   1. Atrial fibrillation/AFL with RVR     - failed DC-CV 01/19/12 2. Acute on chronic diastolic HF 3. Hypertension 4. OSA     - sleep study scheduled for outpatient setting but never followed up 5. COPD 6. Morbid obesity 7. Hypokalemia  Plan/Discussion:    She has failed DC-CV. She is symptomatic with her AF and LA dimension at least moderately dilated on TEE. Thus I don't think restarting flecainide will be sufficient. She has failed Tikosyn in past due to prolonged QT. I spoke with Dr. Rayann Heman and we both agee that amio may be best option in the short term followed by repeat DC-CV on Friday. Will then need to consider possible convergence ablation down the road. Will change IV lasix to po as HF improving.  Will need to address OSA if we are to have any success with AF treatment. Continue Xarelto. Med costs will be an issue.  [name] 4:13 PM    Length of Stay: 5  [name], MD 01/19/2012, 4:01 PM  Patient seen and examined with [name], PA-C. We discussed all aspects of the encounter. I agree with the assessment and plan as stated above.   She remains in AF with RVR. Agree with increasing diltiazem. Will plan for TEE-DC-CV tomorrow after 3rd dose of Xarelto. She had extensive meeting with our pharmacy team today about getting her meds and being compliant. She understands need to be compliant with anticoagulation for at least a full, uninterrupted month of anti-coagulation post- DC-CV. Will restart flecainide tomorrow as well.  Volume status worse today. Agree with increasing lasix. Renal function stable.  [name],MD 4:01 PM

## 2012-01-19 NOTE — Preoperative (Signed)
Beta Blockers   Reason not to administer Beta Blockers:Not Applicable 

## 2012-01-19 NOTE — Anesthesia Preprocedure Evaluation (Addendum)
Anesthesia Evaluation  Patient identified by MRN, date of birth, ID band Patient awake    Reviewed: Allergy & Precautions, H&P , NPO status , Patient's Chart, lab work & pertinent test results  Airway Mallampati: II  Neck ROM: full    Dental  (+) Dental Advisory Given   Pulmonary sleep apnea , COPD COPD inhaler,          Cardiovascular hypertension, +CHF + dysrhythmias Atrial Fibrillation     Neuro/Psych PSYCHIATRIC DISORDERS Anxiety Depression    GI/Hepatic GERD-  ,  Endo/Other  Morbid obesity  Renal/GU      Musculoskeletal   Abdominal   Peds  Hematology   Anesthesia Other Findings   Reproductive/Obstetrics                          Anesthesia Physical Anesthesia Plan  ASA: IV  Anesthesia Plan: General   Post-op Pain Management:    Induction: Intravenous  Airway Management Planned: Mask  Additional Equipment:   Intra-op Plan:   Post-operative Plan:   Informed Consent: I have reviewed the patients History and Physical, chart, labs and discussed the procedure including the risks, benefits and alternatives for the proposed anesthesia with the patient or authorized representative who has indicated his/her understanding and acceptance.   Dental advisory given  Plan Discussed with: Anesthesiologist and Surgeon  Anesthesia Plan Comments:         Anesthesia Quick Evaluation

## 2012-01-19 NOTE — Interval H&P Note (Signed)
History and Physical Interval Note:  01/19/2012 9:44 AM  [name]  has presented today for surgery, with the diagnosis of A-fib  The various methods of treatment have been discussed with the patient and family. After consideration of risks, benefits and other options for treatment, the patient has consented to  Procedure(s) (LRB) with comments: TRANSESOPHAGEAL ECHOCARDIOGRAM (TEE) (N/A) CARDIOVERSION (N/A) as a surgical intervention .  The patient's history has been reviewed, patient examined, no change in status, stable for surgery.  I have reviewed the patient's chart and labs.  Questions were answered to the patient's satisfaction.     Daniel Bensimhon

## 2012-01-19 NOTE — H&P (View-Only) (Signed)
Advanced Heart Failure Rounding Note   Subjective:    [name] is a 51 y.o. female with a history of hypertension, hyperlipidemia, sleep apnea, glucose intolerance, COPD and GERD as well as paroxysmal atrial fibrillation. She had done well on flecainide in the past. She failed Tikosyn due to prolonged QT.   Admitted 12/6 with Afib with RVR (NSR 09/23/10) and volume overload.  IV diltiazem used with improvement in rate (80-90s).  On IV heparin.  Flecainide restarted.  C/o migraine.  Given oxy and phenergan with lethargy and desat into 80s requiring NRB.    Atrial fib 100-120.  C/o HA.  No SOB, orthopnea, PND.  Mild lower extremity edema.    Objective:     Vital Signs:   Temp:  [98.1 F (36.7 C)-98.4 F (36.9 C)] 98.4 F (36.9 C) (12/10 0526) Pulse Rate:  [96-107] 99  (12/10 0526) Resp:  [18-20] 20  (12/10 0526) BP: (114-139)/(72-90) 139/90 mmHg (12/10 0526) SpO2:  [96 %-100 %] 96 % (12/10 0526) Last BM Date: 01/14/12  Weight change: Filed Weights   01/15/12 0539 01/16/12 0642 01/17/12 0538  Weight: 102.241 kg (225 lb 6.4 oz) 102.4 kg (225 lb 12 oz) 103.193 kg (227 lb 8 oz)    Intake/Output:   Intake/Output Summary (Last 24 hours) at 01/18/12 0803 Last data filed at 01/18/12 0600  Gross per 24 hour  Intake 654.67 ml  Output   1850 ml  Net -1195.33 ml     Physical Exam: General:  Mildly sedated but arousable. No resp difficulty, obese HEENT: normal Neck: supple. JVP hard to assess . Carotids 2+ bilat; no bruits. No lymphadenopathy or thryomegaly appreciated. Cor: PMI nondisplaced. Irregular rate & rhythm. No rubs, gallops or murmurs. Lungs: clear Abdomen: obese, soft, nontender, nondistended. No hepatosplenomegaly. No bruits or masses. Good bowel sounds. Extremities: no cyanosis, clubbing, rash, 1+ edema Neuro: alert & orientedx3, cranial nerves grossly intact. moves all 4 extremities w/o difficulty. Affect pleasant  Telemetry: AFL 100-120s  Labs: Basic Metabolic  Panel:  Lab 85/63/14 0610 01/17/12 0610 01/16/12 0620 01/15/12 0236 01/14/12 2058  NA 141 137 140 142 143  K 3.7 4.1 3.8 3.3* 3.5  CL 98 98 99 103 105  CO2 36* _0 GLUCOSE 115* 147* 138* 107* 100*  BUN _1 CREATININE 0.95 1.00 1.18* 1.02 1.14*  CALCIUM 8.6 9.1 9.1 -- --  MG -- -- -- -- 2.1  PHOS -- -- -- -- --    Liver Function Tests:  Lab 01/15/12 0236  AST 31  ALT 49*  ALKPHOS 83  BILITOT 0.7  PROT 6.6  ALBUMIN 3.8   No results found for this basename: LIPASE:5,AMYLASE:5 in the last 168 hours No results found for this basename: AMMONIA:3 in the last 168 hours  CBC:  Lab 01/18/12 0610 01/17/12 0610 01/16/12 0620 01/15/12 0236 01/14/12 1705  WBC 6.9 9.7 11.2* 7.6 7.8  NEUTROABS -- -- -- -- --  HGB 13.7 14.1 14.6 14.1 14.9  HCT 43.9 44.7 45.6 43.3 48.8*  MCV 89.6 89.6 88.5 86.1 89.8  PLT 193 201 209 182 --    Cardiac Enzymes:  Lab 01/15/12 0817 01/15/12 0236 01/14/12 2058  CKTOTAL -- -- --  CKMB -- -- --  CKMBINDEX -- -- --  TROPONINI <0.30 <0.30 <0.30    BNP: BNP (last 3 results)  [facility] 01/14/12 2058  PROBNP 1845.0*     Imaging: Dg Chest 2 View  01/16/2012  *RADIOLOGY REPORT*  Clinical Data: Shortness of breath.  CHEST - 2 VIEW  Comparison: 07/15/2010.  Findings: The heart is enlarged.  The mediastinal and hilar contours are stable.  There is vascular congestion and peribronchial thickening but no overt pulmonary edema.  No pleural effusions.  The bony thorax is intact.  IMPRESSION: Cardiac enlargement and vascular congestion without overt pulmonary edema.   Original Report Authenticated By: Marijo Sanes, M.D.      Medications:     Scheduled Medications:    . aspirin EC  81 mg Oral Daily  . diltiazem  240 mg Oral Daily  . fluticasone  2 spray Each Nare Daily  . furosemide  40 mg Intravenous BID  . loratadine  10 mg Oral Daily  . pantoprazole  40 mg Oral Daily  . potassium chloride  40 mEq Oral Daily  . rivaroxaban  20  mg Oral Daily  . sodium chloride  3 mL Intravenous Q12H  . tiotropium  18 mcg Inhalation Daily  . [DISCONTINUED] flecainide  100 mg Oral Q12H  . [DISCONTINUED] sodium chloride  3 mL Intravenous Q12H    Infusions:    . [DISCONTINUED] sodium chloride    . [DISCONTINUED] diltiazem (CARDIZEM) infusion 15 mg/hr (01/17/12 0153)  . [DISCONTINUED] heparin 1,250 Units/hr (01/17/12 0737)    PRN Medications: sodium chloride, acetaminophen, albuterol, ALPRAZolam, nitroGLYCERIN, ondansetron (ZOFRAN) IV, sodium chloride, traMADol, zolpidem, [DISCONTINUED] sodium chloride   Assessment:   1. Atrial fibrillation/AFL with RVR     - rate controlled on diltiazem 2. Acute on chronic diastolic HF 3. Hypertension 4. OSA     - sleep study scheduled for outpatient setting but never followed up 5. COPD 6. Morbid obesity 7. Hypokalemia  Plan/Discussion:    Remains in Afib with rates increased 100-120 today.  She was transitioned to oral diltiazem yesterday.  Increase diltiazem to 360 mg daily.  Will plan for TEE/DCCV tomorrow.  Xarelto started yesterday (3 days steady state by Wed).  Dr. Caryl Comes reviewed tracings and felt more atrial fib than flutter so will not proceed with ablation at this time.  Can restart flecainide after DCCV.    Increase lasix 80 mg IV BID.  Follow renal function closely.    Length of Stay: [place], [facility] 01/18/2012, 8:03 AM  Patient seen and examined with [name], PA-C. We discussed all aspects of the encounter. I agree with the assessment and plan as stated above.   She remains in AF with RVR. Agree with increasing diltiazem. Will plan for TEE-DC-CV tomorrow after 3rd dose of Xarelto. She had extensive meeting with our pharmacy team today about getting her meds and being compliant. She understands need to be compliant with anticoagulation for at least a full, uninterrupted month of anti-coagulation post- DC-CV. Will restart flecainide tomorrow as well.  Volume  status worse today. Agree with increasing lasix. Renal function stable.  Daniel Bensimhon,MD 3:01 PM

## 2012-01-19 NOTE — Progress Notes (Signed)
IV team paged to insert IV d/t current IV out of date and difficulty.  Per IV team, "only two of [place], that site can be used since it is flushing without difficulty".

## 2012-01-19 NOTE — Progress Notes (Signed)
*  PRELIMINARY RESULTS* Echocardiogram Echocardiogram Transesophageal has been performed.  Leavy Cella 01/19/2012, 10:20 AM

## 2012-01-19 NOTE — Transfer of Care (Signed)
Immediate Anesthesia Transfer of Care Note  Patient: [name]  Procedure(s) Performed: Procedure(s) (LRB) with comments: TRANSESOPHAGEAL ECHOCARDIOGRAM (TEE) (N/A) CARDIOVERSION (N/A)  Patient Location: Endoscopy Unit  Anesthesia Type:General  Level of Consciousness: awake, alert  and oriented  Airway & Oxygen Therapy: Patient Spontanous Breathing and Patient connected to face mask oxygen  Post-op Assessment: Report given to PACU RN, Post -op Vital signs reviewed and stable and Patient moving all extremities X 4  Post vital signs: Reviewed and stable  Complications: No apparent anesthesia complications

## 2012-01-19 NOTE — Anesthesia Postprocedure Evaluation (Signed)
  Anesthesia Post-op Note  Patient: [name]  Procedure(s) Performed: Procedure(s) (LRB) with comments: TRANSESOPHAGEAL ECHOCARDIOGRAM (TEE) (N/A) CARDIOVERSION (N/A)  Patient Location: PACU and Endoscopy Unit  Anesthesia Type:General  Level of Consciousness: awake, alert  and oriented  Airway and Oxygen Therapy: Patient Spontanous Breathing and Patient connected to face mask oxygen  Post-op Pain: none  Post-op Assessment: Post-op Vital signs reviewed, Patient's Cardiovascular Status Stable, RESPIRATORY FUNCTION UNSTABLE, Patent Airway and No signs of Nausea or vomiting  Post-op Vital Signs: Reviewed and stable  Complications: No apparent anesthesia complications

## 2012-01-20 ENCOUNTER — Encounter (HOSPITAL_COMMUNITY): Payer: Self-pay | Admitting: Internal Medicine

## 2012-01-20 LAB — CBC
HCT: 47 % — ABNORMAL HIGH (ref 36.0–46.0)
Hemoglobin: 14.9 g/dL (ref 12.0–15.0)
MCH: 28.2 pg (ref 26.0–34.0)
MCHC: 31.7 g/dL (ref 30.0–36.0)
MCV: 89 fL (ref 78.0–100.0)
Platelets: 224 10*3/uL (ref 150–400)
RBC: 5.28 MIL/uL — ABNORMAL HIGH (ref 3.87–5.11)
RDW: 14 % (ref 11.5–15.5)
WBC: 7 10*3/uL (ref 4.0–10.5)

## 2012-01-20 LAB — BASIC METABOLIC PANEL
BUN: 14 mg/dL (ref 6–23)
CO2: 39 mEq/L — ABNORMAL HIGH (ref 19–32)
Calcium: 9.1 mg/dL (ref 8.4–10.5)
Chloride: 95 mEq/L — ABNORMAL LOW (ref 96–112)
Creatinine, Ser: 1.03 mg/dL (ref 0.50–1.10)
GFR calc Af Amer: 71 mL/min — ABNORMAL LOW (ref >90)
GFR calc non Af Amer: 61 mL/min — ABNORMAL LOW (ref >90)
Glucose, Bld: 104 mg/dL — ABNORMAL HIGH (ref 70–99)
Potassium: 3.7 mEq/L (ref 3.5–5.1)
Sodium: 141 mEq/L (ref 135–145)

## 2012-01-20 MED ORDER — AMIODARONE HCL IN DEXTROSE 360-4.14 MG/200ML-% IV SOLN
60.0000 mg/h | INTRAVENOUS | Status: DC
  Administered 2012-01-20 – 2012-01-21 (×3): 60 mg/h via INTRAVENOUS
  Filled 2012-01-20 (×8): qty 200

## 2012-01-20 MED ORDER — AMIODARONE HCL IN DEXTROSE 360-4.14 MG/200ML-% IV SOLN
30.0000 mg/h | INTRAVENOUS | Status: DC
  Administered 2012-01-20 – 2012-01-21 (×2): 30 mg/h via INTRAVENOUS
  Filled 2012-01-20 (×4): qty 200

## 2012-01-20 MED ORDER — SODIUM CHLORIDE 0.9 % IV SOLN: 250.0000 mL | INTRAVENOUS | Status: DC

## 2012-01-20 MED ORDER — AMIODARONE LOAD VIA INFUSION
150.0000 mg | Freq: Once | INTRAVENOUS | Status: AC
  Administered 2012-01-20: 150 mg via INTRAVENOUS
  Filled 2012-01-20: qty 83.34

## 2012-01-20 MED ORDER — SODIUM CHLORIDE 0.9 % IJ SOLN: 3.0000 mL | Freq: Two times a day (BID) | INTRAMUSCULAR | Status: DC

## 2012-01-20 MED ORDER — SODIUM CHLORIDE 0.9 % IJ SOLN: 3.0000 mL | INTRAMUSCULAR | Status: DC | PRN

## 2012-01-20 NOTE — Progress Notes (Signed)
Advanced Heart Failure Rounding Note   Subjective:    [name] is a 51 y.o. female with a history of hypertension, hyperlipidemia, sleep apnea, glucose intolerance, COPD and GERD as well as paroxysmal atrial fibrillation. She had done well on flecainide in the past. She failed Tikosyn due to prolonged QT.   Admitted 12/6 with Afib with RVR (NSR 09/23/10) and volume overload.  IV diltiazem used with improvement in rate (80-90s).  Placed on Xarelto.  Underwent TEE and attempted DC-CV 12/12 but failed. Remains in AF rates 100-120.  Denies CP or SOB.  Now on amio. Feels fine. Rates 110.    Objective:     Vital Signs:   Temp:  [97.1 F (36.2 C)-98.3 F (36.8 C)] 97.7 F (36.5 C) (12/12 0455) Pulse Rate:  [103-112] 109  (12/12 0455) Resp:  [16-31] 21  (12/12 0455) BP: (103-146)/(64-89) 119/75 mmHg (12/12 0455) SpO2:  [85 %-100 %] 90 % (12/12 0810) Weight:  [218 lb 9.6 oz (99.156 kg)] 218 lb 9.6 oz (99.156 kg) (12/12 0455) Last BM Date: 01/14/12  Weight change: Filed Weights   01/17/12 0538 01/19/12 0441 01/20/12 0455  Weight: 227 lb 8 oz (103.193 kg) 215 lb 2.7 oz (97.6 kg) 218 lb 9.6 oz (99.156 kg)    Intake/Output:   Intake/Output Summary (Last 24 hours) at 01/20/12 0849 Last data filed at 01/20/12 0600  Gross per 24 hour  Intake 475.45 ml  Output   1300 ml  Net -824.55 ml     Physical Exam: General:  No acute distress. HEENT: normal Neck: supple. JVP hard to assess . Carotids 2+ bilat; no bruits. No lymphadenopathy or thryomegaly appreciated. Cor: PMI nondisplaced. Irregular rate & rhythm. No rubs, gallops or murmurs. Lungs: clear Abdomen: obese, soft, nontender, nondistended. No hepatosplenomegaly. No bruits or masses. Good bowel sounds. Extremities: no cyanosis, clubbing, rash, no edema Neuro: alert & orientedx3, cranial nerves grossly intact. moves all 4 extremities w/o difficulty. Affect pleasant  Telemetry: AFL 90-110  Labs: Basic Metabolic Panel:  Lab  97/94/80 0500 01/19/12 0535 01/18/12 0610 01/17/12 0610 01/16/12 0620 01/14/12 2058  NA 141 139 141 137 140 --  K 3.7 3.5 3.7 4.1 3.8 --  CL 95* 92* 98 98 99 --  CO2 39* 40* 36* 26 29 --  GLUCOSE 104* 116* 115* 147* 138* --  BUN _0 --  CREATININE 1.03 0.92 0.95 1.00 1.18* --  CALCIUM 9.1 8.6 8.6 -- -- --  MG -- -- -- -- -- 2.1  PHOS -- -- -- -- -- --    Liver Function Tests:  Lab 01/15/12 0236  AST 31  ALT 49*  ALKPHOS 83  BILITOT 0.7  PROT 6.6  ALBUMIN 3.8   No results found for this basename: LIPASE:5,AMYLASE:5 in the last 168 hours No results found for this basename: AMMONIA:3 in the last 168 hours  CBC:  Lab 01/20/12 0500 01/19/12 0535 01/18/12 0610 01/17/12 0610 01/16/12 0620  WBC 7.0 7.4 6.9 9.7 11.2*  NEUTROABS -- -- -- -- --  HGB 14.9 13.6 13.7 14.1 14.6  HCT 47.0* 43.1 43.9 44.7 45.6  MCV 89.0 88.7 89.6 89.6 88.5  PLT 224 208 193 201 209    Cardiac Enzymes:  Lab 01/15/12 0817 01/15/12 0236 01/14/12 2058  CKTOTAL -- -- --  CKMB -- -- --  CKMBINDEX -- -- --  TROPONINI <0.30 <0.30 <0.30    BNP: BNP (last 3 results)  [facility] 01/14/12 2058  PROBNP 1845.0*  Imaging: No results found.   Medications:     Scheduled Medications:    . [COMPLETED] amiodarone  150 mg Intravenous Once  . aspirin EC  81 mg Oral Daily  . citalopram  10 mg Oral Daily  . fluticasone  2 spray Each Nare Daily  . furosemide  40 mg Oral BID  . loratadine  10 mg Oral Daily  . pantoprazole  40 mg Oral Daily  . potassium chloride  40 mEq Oral BID  . rivaroxaban  20 mg Oral Daily  . sodium chloride  3 mL Intravenous Q12H  . sodium chloride  3 mL Intravenous Q12H  . tiotropium  18 mcg Inhalation Daily  . [DISCONTINUED] diltiazem  360 mg Oral Daily  . [DISCONTINUED] flecainide  150 mg Oral Q12H  . [DISCONTINUED] furosemide  80 mg Intravenous BID    Infusions:    . sodium chloride    . [EXPIRED] amiodarone (NEXTERONE PREMIX) 360 mg/200 mL dextrose 60  mg/hr (01/19/12 1806)   And  . amiodarone (NEXTERONE PREMIX) 360 mg/200 mL dextrose 30 mg/hr (01/19/12 2153)    PRN Medications: sodium chloride, acetaminophen, albuterol, ALPRAZolam, nitroGLYCERIN, ondansetron (ZOFRAN) IV, sodium chloride, sodium chloride, traMADol, zolpidem, [DISCONTINUED] butamben-tetracaine-benzocaine, [DISCONTINUED] fentaNYL, [DISCONTINUED] midazolam   Assessment:   1. Atrial fibrillation/AFL with RVR     - failed DC-CV 01/19/12 2. Acute on chronic diastolic HF 3. Hypertension 4. OSA     - sleep study scheduled for outpatient setting but never followed up 5. COPD 6. Morbid obesity 7. Hypokalemia  Plan/Discussion:    She has failed DC-CV and is currently being loaded with amiodarone as she has failed Tikosyn in the past due to prolonged QT.  Will continue amio load today and repeat DC-CV tomorrow.  Will continue Xarelto at this time.  We have a month of samples available for the patient.      Volume status looks good.  Will continue po lasix.    Will need to address OSA if we are to have any success with AF treatment.    Teressa Senter, [facility] 8:49 AM   Length of Stay: 6  Patient seen and examined with [name], PA-C. We discussed all aspects of the encounter. I agree with the assessment and plan as stated above.   Looks much better. Still in AF. Loading amio. Will plan repeat DC-CV tomorrow.   Volume status much improved. Continue po lasix.  While on amio likely won't need BB or CCB.  Continue Xarelto. We have arranged for prescription assistance.  I have d/w Dr. Rayann Heman and he has offered to see her as outpatient to f/u on AF and discuss possibility of referral for convergence ablation at [facility]. Will absolutely need to get her OSA under control.   Daniel Bensimhon,MD 10:54 AM

## 2012-01-21 ENCOUNTER — Encounter (HOSPITAL_COMMUNITY): Payer: Self-pay | Admitting: Anesthesiology

## 2012-01-21 ENCOUNTER — Inpatient Hospital Stay (HOSPITAL_COMMUNITY): Payer: BC Managed Care – PPO | Admitting: Anesthesiology

## 2012-01-21 ENCOUNTER — Encounter (HOSPITAL_COMMUNITY): Payer: Self-pay

## 2012-01-21 ENCOUNTER — Encounter (HOSPITAL_COMMUNITY)
Admission: AD | Disposition: A | Discharge status: Home / Self Care | Payer: Self-pay | Source: Ambulatory Visit | Attending: Cardiovascular Disease

## 2012-01-21 DIAGNOSIS — I4891 Unspecified atrial fibrillation: Secondary | ICD-10-CM

## 2012-01-21 HISTORY — PX: CARDIOVERSION: SHX1299

## 2012-01-21 LAB — CBC
HCT: 45.7 % (ref 36.0–46.0)
Hemoglobin: 14.1 g/dL (ref 12.0–15.0)
MCH: 27 pg (ref 26.0–34.0)
MCHC: 30.9 g/dL (ref 30.0–36.0)
MCV: 87.5 fL (ref 78.0–100.0)
Platelets: 223 10*3/uL (ref 150–400)
RBC: 5.22 MIL/uL — ABNORMAL HIGH (ref 3.87–5.11)
RDW: 14.1 % (ref 11.5–15.5)
WBC: 7.2 10*3/uL (ref 4.0–10.5)

## 2012-01-21 LAB — T4, FREE: Free T4: 1.67 ng/dL (ref 0.80–1.80)

## 2012-01-21 SURGERY — CARDIOVERSION
Anesthesia: Monitor Anesthesia Care

## 2012-01-21 MED ORDER — HYDROCORTISONE 1 % EX CREA
1.0000 "application " | TOPICAL_CREAM | Freq: Three times a day (TID) | CUTANEOUS | Status: DC | PRN
  Filled 2012-01-21: qty 28

## 2012-01-21 MED ORDER — DILTIAZEM HCL ER COATED BEADS 360 MG PO CP24
360.0000 mg | ORAL_CAPSULE | Freq: Every day | ORAL | Status: DC
  Administered 2012-01-21 – 2012-01-23 (×3): 360 mg via ORAL
  Filled 2012-01-21 (×3): qty 1

## 2012-01-21 MED ORDER — PROPOFOL 10 MG/ML IV BOLUS
INTRAVENOUS | Status: DC | PRN
  Administered 2012-01-21: 70 mg via INTRAVENOUS

## 2012-01-21 MED ORDER — RIVAROXABAN 20 MG PO TABS: 20.0000 mg | ORAL_TABLET | Freq: Every day | ORAL | Status: DC

## 2012-01-21 MED ORDER — SODIUM CHLORIDE 0.9 % IJ SOLN: 3.0000 mL | Freq: Two times a day (BID) | INTRAMUSCULAR | Status: DC

## 2012-01-21 MED ORDER — SODIUM CHLORIDE 0.9 % IJ SOLN: 3.0000 mL | INTRAMUSCULAR | Status: DC | PRN

## 2012-01-21 MED ORDER — AMIODARONE HCL IN DEXTROSE 360-4.14 MG/200ML-% IV SOLN
60.0000 mg/h | INTRAVENOUS | Status: DC
  Administered 2012-01-21: 60 mg/h via INTRAVENOUS
  Filled 2012-01-21 (×4): qty 200

## 2012-01-21 MED ORDER — SODIUM CHLORIDE 0.9 % IV SOLN: 250.0000 mL | INTRAVENOUS | Status: DC

## 2012-01-21 MED ORDER — SODIUM CHLORIDE 0.9 % IV SOLN
INTRAVENOUS | Status: DC | PRN
  Administered 2012-01-21: 13:00:00 via INTRAVENOUS

## 2012-01-21 MED ORDER — PANTOPRAZOLE SODIUM 40 MG PO TBEC: 40.0000 mg | DELAYED_RELEASE_TABLET | Freq: Every day | ORAL | Status: DC

## 2012-01-21 MED ORDER — AMIODARONE HCL 200 MG PO TABS
400.0000 mg | ORAL_TABLET | Freq: Two times a day (BID) | ORAL | Status: DC
  Administered 2012-01-21 – 2012-01-23 (×4): 400 mg via ORAL
  Filled 2012-01-21 (×5): qty 2

## 2012-01-21 MED ORDER — DILTIAZEM HCL ER COATED BEADS 240 MG PO CP24
240.0000 mg | ORAL_CAPSULE | Freq: Every day | ORAL | Status: DC
  Filled 2012-01-21: qty 1

## 2012-01-21 NOTE — Anesthesia Postprocedure Evaluation (Signed)
  Anesthesia Post-op Note  Patient: [name]  Procedure(s) Performed: Procedure(s) (LRB) with comments: CARDIOVERSION (N/A) - Rm [id number]  Patient Location: Endoscopy Unit  Anesthesia Type:General  Level of Consciousness: awake, alert  and oriented  Airway and Oxygen Therapy: Patient Spontanous Breathing and Patient connected to nasal cannula oxygen  Post-op Pain: none  Post-op Assessment: Post-op Vital signs reviewed, Patient's Cardiovascular Status Stable, Respiratory Function Stable and Patent Airway  Post-op Vital Signs: stable  Complications: No apparent anesthesia complications

## 2012-01-21 NOTE — Anesthesia Postprocedure Evaluation (Signed)
  Anesthesia Post-op Note  Patient: [name]  Procedure(s) Performed: Procedure(s) (LRB) with comments: CARDIOVERSION (N/A) - Rm [id number]  Patient Location: Endoscopy Unit  Anesthesia Type:General  Level of Consciousness: awake, alert  and oriented  Airway and Oxygen Therapy: Patient Spontanous Breathing  Post-op Pain: none  Post-op Assessment: Post-op Vital signs reviewed, Patient's Cardiovascular Status Stable and Respiratory Function Stable  Post-op Vital Signs: stable  Complications: No apparent anesthesia complications

## 2012-01-21 NOTE — Transfer of Care (Signed)
Immediate Anesthesia Transfer of Care Note  Patient: [name]  Procedure(s) Performed: Procedure(s) (LRB) with comments: CARDIOVERSION (N/A) - Rm 4733  Patient Location: Endoscopy Unit  Anesthesia Type:General  Level of Consciousness: awake, alert  and oriented  Airway & Oxygen Therapy: Patient Spontanous Breathing and Patient connected to nasal cannula oxygen  Post-op Assessment: Report given to PACU RN and Post -op Vital signs reviewed and stable  Post vital signs: Reviewed and stable  Complications: No apparent anesthesia complications

## 2012-01-21 NOTE — Progress Notes (Signed)
Patient has returned from Endoscopy, report received from nurse Terri, patient awake, alert and oriented; will continue to monitor patient.  Ruben Im RN

## 2012-01-21 NOTE — Addendum Note (Signed)
Addendum  created 01/21/12 1505 by Roberts Gaudy, MD   Modules edited:Notes Section

## 2012-01-21 NOTE — Discharge Summary (Signed)
Advanced Heart Failure Team  Discharge Summary   Patient ID: [name] MRN: [id number], DOB/AGE: 1960/06/26 51 y.o. Admit date: 01/14/2012 D/C date:     01/21/2012   Primary Discharge Diagnoses:  1. Atrial fibrillation/AFL with RVR      - failed DC-CV 01/19/12  2. Acute on chronic diastolic HF  3. Hypertension  4. OSA  -    sleep study scheduled for outpatient setting but never followed up  5. COPD  6. Morbid obesity  7. Hypokalemia 8. Medication noncompliance  Secondary Discharge Diagnoses:  1. Depression 2. GERD  Hospital Course:  [name] is a 51 y.o. female with history of hypertension, hyperlipidemia, sleep apnea, glucose intolerance, COPD and GERD as well as paroxysmal atrial fibrillation. She had done well on flecainide in the past. She failed Tikosyn due to prolonged QT.   She had been out of her medications for ~3 months prior to admit due to financial concerns.  She began to experience progressive dyspnea, orthopnea, and lower extremity edema.  Her symptoms became worse enough that she presented to the urgent care and was transferred to [facility] for heart failure exacerbation and atrial flutter with RVR 146 bpm.  She is unaware of her rate therefore duration is unknown.    She was admitted by the cardiology service and started on IV diltiazem and heparin.  Rate was better controlled on IV diltiazem.  Diltiazem transitioned to oral regimen on 12/9.  Flecainide was discontinued given unknown duration of atrial fibrillation and possibility of chemical cardioversion prior to TEE.  She was started on xarelto on 01/17/12 as samples have been provided and she has been approved for patient assistance program.  There was discussion of ablation but Dr. Caryl Comes reviewed tracings and felt arrythmia to be mostly coarse Afib therefore after 3 doses of xarelto and was taken for TEE/DCCV.   No LAA clot noted.  There was unsuccessful cardioversion by Dr. Haroldine Laws on 12/11.  After discussion  with Dr. Rayann Heman she was loaded on amiodarone for repeat cardioversion.   Her heart failure symptoms were exacerbated by her atrial fibrillation.  She was placed on IV diuresis during admission.  She diuresed 5 liters with improvement in dyspnea and edema.  She was transitioned to oral lasix 40 mg BID on 12/11.  Discharge weight 219 pounds.    Stay was c/b migraines.  She was given oxycodone 5 mg and phenergan with drop in her O2 sats and lethargy.  She required nonrebreather for a short amount of time with improvement in O2 sats to 4L n/c.  All sedating agents were held during admission.  Tylenol and utlram were used for migraines with improvement.  By discharge O2 sats 95-99% on RA.    Appreciate case management help with med assistance.   On day of discharge the patient was evaluated by Dr. Aundra Dubin and felt stable for home with outpatient follow up.  Discharge Weight Range: 216-219 pounds Discharge Vitals: Blood pressure 101/70, pulse 106, temperature 97.5 F (36.4 C), temperature source Oral, resp. rate 18, height 4' 11.5" (1.511 m), weight 219 lb 5.7 oz (99.5 kg), SpO2 90.00%.  Physical Exam:  General: No acute distress.  HEENT: normal  Neck: supple. JVP hard to assess . Carotids 2+ bilat; no bruits. No lymphadenopathy or thryomegaly appreciated.  Cor: PMI nondisplaced. Irregular rate & rhythm. No rubs, gallops or murmurs.  Lungs: clear  Abdomen: obese, soft, nontender, nondistended. No hepatosplenomegaly. No bruits or masses. Good bowel sounds.  Extremities: no  cyanosis, clubbing, rash, no edema  Neuro: alert & orientedx3, cranial nerves grossly intact. moves all 4 extremities w/o difficulty. Affect pleasant   Telemetry: AFL 90-110  Labs: Lab Results  Component Value Date   WBC 7.2 01/21/2012   HGB 14.1 01/21/2012   HCT 45.7 01/21/2012   MCV 87.5 01/21/2012   PLT 223 01/21/2012    Lab 01/20/12 0500 01/15/12 0236  NA 141 --  K 3.7 --  CL 95* --  CO2 39* --  BUN 14 --   CREATININE 1.03 --  CALCIUM 9.1 --  PROT -- 6.6  BILITOT -- 0.7  ALKPHOS -- 83  ALT -- 49*  AST -- 31  GLUCOSE 104* --   Lab Results  Component Value Date   CHOL 191 07/12/2010   HDL 49 07/12/2010   LDLCALC  Value: 106        Total Cholesterol/HDL:CHD Risk Coronary Heart Disease Risk Table                     Men   Women  1/2 Average Risk   3.4   3.3  Average Risk       5.0   4.4  2 X Average Risk   9.6   7.1  3 X Average Risk  23.4   11.0        Use the calculated Patient Ratio above and the CHD Risk Table to determine the patient's CHD Risk.        ATP III CLASSIFICATION (LDL):  <100     mg/dL   Optimal  100-129  mg/dL   Near or Above                    Optimal  130-159  mg/dL   Borderline  160-189  mg/dL   High  >190     mg/dL   Very High* 07/12/2010   TRIG 181* 07/12/2010   BNP (last 3 results)  Basename 01/14/12 2058  PROBNP 1845.0*    Diagnostic Studies/Procedures   No results found.  Discharge Medications     Medication List     As of 01/21/2012  9:41 AM    ASK your doctor about these medications         albuterol 108 (90 BASE) MCG/ACT inhaler   Commonly known as: PROVENTIL HFA;VENTOLIN HFA   Inhale 2 puffs into the lungs every 6 (six) hours as needed. For wheezing      aspirin EC 81 MG tablet   Take 81 mg by mouth daily.      FLAXSEED OIL PO   Take 1 capsule by mouth daily.      tiotropium 18 MCG inhalation capsule   Commonly known as: SPIRIVA   Place 18 mcg into inhaler and inhale daily.        Disposition   The patient will be discharged in stable condition to home.      Duration of Discharge Encounter: Greater than 35 minutes   Signed, Teressa Senter, [facility] 01/21/2012, 9:41 AM   ADDENDUM:  Patient was seen on morning of discharge 01/22/2012 by Dr. Wyatt Haste and found to be stable to return home. Followup appointments have been scheduled.

## 2012-01-21 NOTE — Anesthesia Postprocedure Evaluation (Signed)
  Anesthesia Post-op Note  Patient: CALEYAH JR  Procedure(s) Performed: Procedure(s) (LRB) with comments: CARDIOVERSION (N/A) - Rm 4733  Patient Location: Endoscopy Unit  Anesthesia Type:General  Level of Consciousness: awake, alert  and oriented  Airway and Oxygen Therapy: Patient Spontanous Breathing and Patient connected to nasal cannula oxygen  Post-op Pain: none  Post-op Assessment: Post-op Vital signs reviewed, Patient's Cardiovascular Status Stable, Respiratory Function Stable and Patent Airway  Post-op Vital Signs: Reviewed and stable  Complications: No apparent anesthesia complications

## 2012-01-21 NOTE — Preoperative (Signed)
Beta Blockers   Reason not to administer Beta Blockers:Not Applicable

## 2012-01-21 NOTE — Consult Note (Addendum)
ELECTROPHYSIOLOGY CONSULT NOTE    Patient ID: [name] MRN: [id number], DOB/AGE: May 05, 1960 51 y.o.  Admit date: 01/14/2012 Date of Consult: 01-21-2012  Primary Physician: Kelby Aline, [place] Primary Cardiologist: Pierre Bali, MD  Reason for Consultation: atrial fibrillation  HPI:  [name] is a 51 y.o. female with history of hypertension, hyperlipidemia, sleep apnea, glucose intolerance, COPD and GERD as well as paroxysmal atrial fibrillation. She had done well on flecainide in the past but eventually developed frequent recurrence on flecainide. She failed Tikosyn due to prolonged QT.   She had been out of her medications for ~3 months prior to admit due to financial concerns. She began to experience progressive dyspnea, orthopnea, and lower extremity edema. Her symptoms became worse enough that she presented to the urgent care and was transferred to [facility] for heart failure exacerbation and atrial flutter with RVR 146 bpm. She is unaware of her rate therefore duration is unknown.   She was admitted by the cardiology service and started on IV diltiazem and heparin. Rate was better controlled on IV diltiazem. Diltiazem transitioned to oral regimen on 12/9. Flecainide was discontinued given unknown duration of atrial fibrillation and possibility of chemical cardioversion prior to TEE. She was started on xarelto on 01/17/12 as samples have been provided and she has been approved for patient assistance program.  She underwent TEE/DCCV on 12-11, but failed cardioversion.  She was loaded on Amiodarone and underwent attempted repeat DCCV today which was also unsuccessful.  EP has been asked to evaluate for treatment options.  ROS is negative except as outlined above.    Past Medical History  Diagnosis Date  . HTN (hypertension)   . Hyperlipidemia   . GERD (gastroesophageal reflux disease)   . Depression   . Anxiety   . Persistent atrial fibrillation     Myoview 4/09: No  ischemia;  echo 4/09: EF 60-65%;   Flecainide, beta blocker, Coumadin;    Unable to take Tikosyn 2/2 prolonged QT  . Obstructive sleep apnea   . Borderline diabetes   . Atrial flutter   . Chronic diastolic heart failure   . Pulmonary embolism     Right lower lobe diagnosed by CT 6/12  . COPD (chronic obstructive pulmonary disease)      Surgical History:  Past Surgical History  Procedure Date  . Tubal ligation 1996  . Tee without cardioversion 01/19/2012    Procedure: TRANSESOPHAGEAL ECHOCARDIOGRAM (TEE);  Surgeon: Jolaine Artist, MD;  Location[facility] ENDOSCOPY;  Service: Cardiovascular;  Laterality: N/A;  . Cardioversion 01/19/2012    Procedure: CARDIOVERSION;  Surgeon: Jolaine Artist, MD;  Location: [facility] ENDOSCOPY;  Service: Cardiovascular;  Laterality: N/A;     Prescriptions prior to admission  Medication Sig Dispense Refill  . tiotropium (SPIRIVA) 18 MCG inhalation capsule Place 18 mcg into inhaler and inhale daily.      . [DISCONTINUED] albuterol (PROVENTIL HFA;VENTOLIN HFA) 108 (90 BASE) MCG/ACT inhaler Inhale 2 puffs into the lungs every 6 (six) hours as needed. For wheezing      . [DISCONTINUED] aspirin EC 81 MG tablet Take 81 mg by mouth daily.      . [DISCONTINUED] Flaxseed, Linseed, (FLAXSEED OIL PO) Take 1 capsule by mouth daily.        Inpatient Medications:     . amiodarone  400 mg Oral BID  . citalopram  10 mg Oral Daily  . diltiazem  240 mg Oral Daily  . fluticasone  2 spray Each Nare Daily  .  furosemide  40 mg Oral BID  . loratadine  10 mg Oral Daily  . pantoprazole  40 mg Oral Daily  . potassium chloride  40 mEq Oral BID  . rivaroxaban  20 mg Oral Daily  . sodium chloride  3 mL Intravenous Q12H  . tiotropium  18 mcg Inhalation Daily    Allergies:  Allergies  Allergen Reactions  . Sulfonamide Derivatives Hives and Itching    History   Social History  . Marital Status: Legally Separated    Spouse Name: n/a    Number of Children: 4  . Years of  Education: 12   Occupational History  . clerical     [place] History Main Topics  . Smoking status: Former Smoker -- 2.0 packs/day for 35 years    Types: Cigarettes    Quit date: 09/08/2009  . Smokeless tobacco: Never Used  . Alcohol Use: No  . Drug Use: No  . Sexually Active: No   Other Topics Concern  . Not on file   Social History Narrative   Married but currently separated with 4 children, 2 of whom still live with her.  Youngest son is a Equities trader in [employer], to graduate in 07/2012.     Family History  Problem Relation Age of Onset  . Colon cancer Mother   . Coronary artery disease Mother   . Coronary artery disease Father   . Asthma Father   . Emphysema Father   . Allergies Father   . Heart attack Maternal Grandfather   . Heart disease Maternal Grandmother   . Depression Daughter       Labs:   Lab Results  Component Value Date   WBC 7.2 01/21/2012   HGB 14.1 01/21/2012   HCT 45.7 01/21/2012   MCV 87.5 01/21/2012   PLT 223 01/21/2012     Lab 01/20/12 0500 01/15/12 0236  NA 141 --  K 3.7 --  CL 95* --  CO2 39* --  BUN 14 --  CREATININE 1.03 --  CALCIUM 9.1 --  PROT -- 6.6  BILITOT -- 0.7  ALKPHOS -- 83  ALT -- 49*  AST -- 31  GLUCOSE 104* --   Lab Results  Component Value Date   DDIMER 0.28 01/15/2012    Physical Exam: Filed Vitals:   01/21/12 1415 01/21/12 1430 01/21/12 1445 01/21/12 1500  BP: 148/102 153/86 140/92 161/99  Pulse:      Temp:      TempSrc:      Resp: _0 Height:      Weight:      SpO2: 99% 98% 98% 96%    GEN- The patient is well appearing, alert and oriented x 3 today.   Head- normocephalic, atraumatic Eyes-  Sclera clear, conjunctiva pink Ears- hearing intact Oropharynx- clear Neck- supple,   Lungs- Clear to ausculation bilaterally, normal work of breathing Heart- tachycardic irregular rhythm,  no murmurs, rubs or gallops, PMI not laterally displaced GI- soft, NT, ND, + BS Extremities- no  clubbing, cyanosis, + edema MS- no significant deformity or atrophy Skin- no rash or lesion Psych- euthymic mood, full affect Neuro- strength and sensation are intact   Radiology/Studies: Dg Chest 2 View 01/16/2012  *RADIOLOGY REPORT*  Clinical Data: Shortness of breath.  CHEST - 2 VIEW  Comparison: 07/15/2010.  Findings: The heart is enlarged.  The mediastinal and hilar contours are stable.  There is vascular congestion and peribronchial thickening but no overt pulmonary  edema.  No pleural effusions.  The bony thorax is intact.  IMPRESSION: Cardiac enlargement and vascular congestion without overt pulmonary edema.   Original Report Authenticated By: Marijo Sanes, M.D.    Echo reviewed, moderately enlarged LA size PNT:IRWERX afib, rate 114  TELEMETRY: afib with controlled ventricular response  Assessment and Plan:   1. Persistent atrial fib The patient presents with persistent afib with RVR and symptoms of SOB and decreased exercise tolerance.  Her afib has been difficult to treat recently.  She has failed medical therapy with flecainide, tikosyn, and amiodarone.  Echo reveals moderate LA enlargement. I would anticipate that our ability to maintain sinus rhythm long term will be difficult.  I would recommend that she continue on amiodarone 445m BID x 1 week then 4035mdaily as an outpatient.  Once she is rate controlled with cardizem (increased by me now to 36075maily), she can be discharged.  I would like to see her in the office in 2 weeks to discuss repeat cardioversion vs consideration of catheter ablation.  She is certainly willing to consider ablation.  She will need to continue xarelto in the interim.  2. Sleep apnea Noncompliant with CPAP The importance of compliance was discussed today.  3. Obesity Weight loss advised  JamJeneen Rinkslred,MD

## 2012-01-21 NOTE — Progress Notes (Signed)
01/19/12 1300 In to speak with pt. about medication assistance.  Pt. states she has prescription coverage with [employer].  Gave pt. the paperwork for the Xarelto assistance and explained how to complete patient part, and that she has to send in copies of her most recent tax forms.  Pt. most appreciative of assitance.   Llana Aliment, RN, BSN NCM [phone]

## 2012-01-21 NOTE — CV Procedure (Signed)
   The patient was prepared for cardioversion. Consent was signed. Time out was done appropriately. Anesthesia was present.  The patient received 70 mg of IV propofol.  Anterior posterior pads were in place using a biphasic defibrillator. The patient received 120 J of energy. She converted to sinus rhythm but held sinus for only approximately 30 seconds. A second cardioversion attempt was undertaken giving the patient 200 J of biphasic energy. Again she converted to sinus but held sinus for only approximately 15 seconds. She then reverted to her atrial arrhythmia. At that point it was felt that further cardioversion attempts should not be undertaken at this time.  Patient tolerated the procedure well. She received 2 shocks. On both occasions she converted to sinus but then immediately reverted to her atrial arrhythmia. Further plans will be made concerning to the approach to her arrhythmia.  Daryel November, MD

## 2012-01-21 NOTE — Anesthesia Preprocedure Evaluation (Addendum)
Anesthesia Evaluation  Patient identified by MRN, date of birth, ID band Patient awake    Reviewed: Allergy & Precautions, H&P , NPO status   History of Anesthesia Complications Negative for: history of anesthetic complications  Airway Mallampati: III TM Distance: >3 FB Neck ROM: Full    Dental  (+) Teeth Intact and Dental Advisory Given   Pulmonary sleep apnea , COPDformer smoker,  breath sounds clear to auscultation  + decreased breath sounds      Cardiovascular hypertension, Pt. on medications +CHF + dysrhythmias Atrial Fibrillation Rhythm:Irregular Rate:Tachycardia     Neuro/Psych    GI/Hepatic GERD-  Controlled and Medicated,  Endo/Other  Morbid obesity  Renal/GU      Musculoskeletal   Abdominal   Peds  Hematology   Anesthesia Other Findings   Reproductive/Obstetrics                          Anesthesia Physical Anesthesia Plan  ASA: III  Anesthesia Plan: General   Post-op Pain Management:    Induction: Intravenous  Airway Management Planned: Mask  Additional Equipment:   Intra-op Plan:   Post-operative Plan:   Informed Consent: I have reviewed the patients History and Physical, chart, labs and discussed the procedure including the risks, benefits and alternatives for the proposed anesthesia with the patient or authorized representative who has indicated his/her understanding and acceptance.     Plan Discussed with: CRNA  Anesthesia Plan Comments:         Anesthesia Quick Evaluation

## 2012-01-21 NOTE — Progress Notes (Addendum)
Advanced Heart Failure Rounding Note   Subjective:    [name] is a 50 y.o. female with a history of hypertension, hyperlipidemia, sleep apnea, glucose intolerance, COPD and GERD as well as paroxysmal atrial fibrillation. She had done well on flecainide in the past. She failed Tikosyn due to prolonged QT.   Admitted 12/6 with Afib with RVR (NSR 09/23/10) and volume overload.  IV diltiazem used with improvement in rate (80-90s).  Placed on Xarelto.  Underwent TEE and attempted DC-CV 12/12 but failed. Loaded with amio since that time.  Remains in AF rates 100-120.  Denies CP or SOB.  Has had second failed DCCV today.     Objective:     Vital Signs:   Temp:  [97.5 F (36.4 C)-98 F (36.7 C)] 97.7 F (36.5 C) (12/13 1245) Pulse Rate:  [102-106] 104  (12/13 1006) Resp:  [14-21] 16  (12/13 1345) BP: (101-145)/(34-106) 138/63 mmHg (12/13 1345) SpO2:  [90 %-100 %] 100 % (12/13 1330) Weight:  [219 lb 5.7 oz (99.5 kg)] 219 lb 5.7 oz (99.5 kg) (12/13 0626) Last BM Date: 01/18/12  Weight change: Filed Weights   01/19/12 0441 01/20/12 0455 01/21/12 0626  Weight: 215 lb 2.7 oz (97.6 kg) 218 lb 9.6 oz (99.156 kg) 219 lb 5.7 oz (99.5 kg)    Intake/Output:   Intake/Output Summary (Last 24 hours) at 01/21/12 1449 Last data filed at 01/21/12 1345  Gross per 24 hour  Intake 976.18 ml  Output   1400 ml  Net -423.82 ml     Physical Exam: General:  No acute distress. HEENT: normal Neck: supple. JVP hard to assess . Carotids 2+ bilat; no bruits. No lymphadenopathy or thryomegaly appreciated. Cor: PMI nondisplaced. Irregular rate & rhythm. No rubs, gallops or murmurs. Lungs: clear Abdomen: obese, soft, nontender, nondistended. No hepatosplenomegaly. No bruits or masses. Good bowel sounds. Extremities: no cyanosis, clubbing, rash, no edema Neuro: alert & orientedx3, cranial nerves grossly intact. moves all 4 extremities w/o difficulty. Affect pleasant  Telemetry: AFL  90-110  Labs: Basic Metabolic Panel:  Lab 18/56/31 0500 01/19/12 0535 01/18/12 0610 01/17/12 0610 01/16/12 0620 01/14/12 2058  NA 141 139 141 137 140 --  K 3.7 3.5 3.7 4.1 3.8 --  CL 95* 92* 98 98 99 --  CO2 39* 40* 36* 26 29 --  GLUCOSE 104* 116* 115* 147* 138* --  BUN _0 --  CREATININE 1.03 0.92 0.95 1.00 1.18* --  CALCIUM 9.1 8.6 8.6 -- -- --  MG -- -- -- -- -- 2.1  PHOS -- -- -- -- -- --    Liver Function Tests:  Lab 01/15/12 0236  AST 31  ALT 49*  ALKPHOS 83  BILITOT 0.7  PROT 6.6  ALBUMIN 3.8   No results found for this basename: LIPASE:5,AMYLASE:5 in the last 168 hours No results found for this basename: AMMONIA:3 in the last 168 hours  CBC:  Lab 01/21/12 0500 01/20/12 0500 01/19/12 0535 01/18/12 0610 01/17/12 0610  WBC 7.2 7.0 7.4 6.9 9.7  NEUTROABS -- -- -- -- --  HGB 14.1 14.9 13.6 13.7 14.1  HCT 45.7 47.0* 43.1 43.9 44.7  MCV 87.5 89.0 88.7 89.6 89.6  PLT 223 224 208 193 201    Cardiac Enzymes:  Lab 01/15/12 0817 01/15/12 0236 01/14/12 2058  CKTOTAL -- -- --  CKMB -- -- --  CKMBINDEX -- -- --  TROPONINI <0.30 <0.30 <0.30    BNP: BNP (last 3 results)  Flo Shanks  01/14/12 2058  PROBNP 1845.0*     Imaging: No results found.   Medications:     Scheduled Medications:    . [facility] HOLD] aspirin EC  81 mg Oral Daily  . [facility] HOLD] citalopram  10 mg Oral Daily  . [MAR HOLD] fluticasone  2 spray Each Nare Daily  . [facility] HOLD] furosemide  40 mg Oral BID  . [facility] HOLD] loratadine  10 mg Oral Daily  . [MAR HOLD] pantoprazole  40 mg Oral Daily  . [facility] HOLD] potassium chloride  40 mEq Oral BID  . [facility] HOLD] rivaroxaban  20 mg Oral Daily  . [MAR HOLD] sodium chloride  3 mL Intravenous Q12H  . [MAR HOLD] tiotropium  18 mcg Inhalation Daily    Infusions:    . sodium chloride    . amiodarone (NEXTERONE PREMIX) 360 mg/200 mL dextrose 60 mg/hr (01/21/12 1017)    PRN Medications: [MAR HOLD] sodium chloride, [MAR HOLD] acetaminophen,  [MAR HOLD] albuterol, [MAR HOLD] ALPRAZolam, [MAR HOLD] hydrocortisone cream, [MAR HOLD] nitroGLYCERIN, [MAR HOLD] ondansetron (ZOFRAN) IV, [MAR HOLD] sodium chloride, [MAR HOLD] traMADol, [MAR HOLD] zolpidem   Assessment:   1. Atrial fibrillation/AFL with RVR     - failed DC-CV 01/19/12 2. Acute on chronic diastolic HF 3. Hypertension 4. OSA     - sleep study scheduled for outpatient setting but never followed up 5. COPD 6. Morbid obesity 7. Hypokalemia  Plan/Discussion:    She has failed second attempt at [facility] despite being loaded with amiodarone.  She has also failed Tikosyn in past due to prolonged QT.   Will ask EP to see for further recommendations.  Can stop amio at this time and try rate control with Cardizem CD 240 mg daily and Toprol XL 50 mg daily.   Will continue Xarelto at this time.  We have a month of samples available for the patient.      Volume status looks good.  Will continue po lasix.    Will need to address OSA if we are to have any success with AF treatment.    [name], [facility] 2:49 PM   Length of Stay: 7  Patient seen with PA, agree with the above note.  Failed DCCV after amiodarone load.   - Change to po amiodarone, re-attempt DCCV in a couple of weeks after further loading.  - Start diltiazem CD 240 daily for rate control, titrate up as needed.  - Will have EP evaluate for any further ideas.   [name] 01/21/2012 2:57 PM

## 2012-01-22 LAB — BASIC METABOLIC PANEL
BUN: 16 mg/dL (ref 6–23)
CO2: 35 mEq/L — ABNORMAL HIGH (ref 19–32)
Calcium: 9.4 mg/dL (ref 8.4–10.5)
Chloride: 99 mEq/L (ref 96–112)
Creatinine, Ser: 1.18 mg/dL — ABNORMAL HIGH (ref 0.50–1.10)
GFR calc Af Amer: 60 mL/min — ABNORMAL LOW (ref >90)
GFR calc non Af Amer: 52 mL/min — ABNORMAL LOW (ref >90)
Glucose, Bld: 103 mg/dL — ABNORMAL HIGH (ref 70–99)
Potassium: 3.8 mEq/L (ref 3.5–5.1)
Sodium: 143 mEq/L (ref 135–145)

## 2012-01-22 LAB — CBC
HCT: 46.9 % — ABNORMAL HIGH (ref 36.0–46.0)
Hemoglobin: 15.4 g/dL — ABNORMAL HIGH (ref 12.0–15.0)
MCH: 28.3 pg (ref 26.0–34.0)
MCHC: 32.8 g/dL (ref 30.0–36.0)
MCV: 86.1 fL (ref 78.0–100.0)
Platelets: 231 10*3/uL (ref 150–400)
RBC: 5.45 MIL/uL — ABNORMAL HIGH (ref 3.87–5.11)
RDW: 14.3 % (ref 11.5–15.5)
WBC: 7.8 10*3/uL (ref 4.0–10.5)

## 2012-01-22 NOTE — Progress Notes (Signed)
Subjective:  No CP  Breathing is stable  Still gets SOB with activity. Objective: Filed Vitals:   01/21/12 1500 01/21/12 1716 01/21/12 2021 01/22/12 0629  BP: 161/99 107/76 107/83 118/64  Pulse:  130 109 86  Temp:  98.3 F (36.8 C) 98.2 F (36.8 C) 97.7 F (36.5 C)  TempSrc:  Oral Oral Oral  Resp: _0 Height:      Weight:    217 lb 13 oz (98.8 kg)  SpO2: 96% 94% 94% 95%   Weight change: -1 lb 8.7 oz (-0.7 kg)  Intake/Output Summary (Last 24 hours) at 01/22/12 0848 Last data filed at 01/22/12 0600  Gross per 24 hour  Intake 852.48 ml  Output   1600 ml  Net -747.52 ml    General: Alert, awake, oriented x3, in no acute distress Neck:  JVP is normal Heart: Regular rate and rhythm, without murmurs, rubs, gallops.  Lungs: Clear to auscultation.  No rales or wheezes. Exemities:  No edema.   Neuro: Grossly intact, nonfocal.  Tele:  Afib  AVg 100 Lab Results: Results for orders placed during the hospital encounter of 01/14/12 (from the past 24 hour(s))  CBC     Status: Abnormal   Collection Time   01/22/12  5:41 AM      Component Value Range   WBC 7.8  4.0 - 10.5 K/uL   RBC 5.45 (*) 3.87 - 5.11 MIL/uL   Hemoglobin 15.4 (*) 12.0 - 15.0 g/dL   HCT 46.9 (*) 36.0 - 46.0 %   MCV 86.1  78.0 - 100.0 fL   MCH 28.3  26.0 - 34.0 pg   MCHC 32.8  30.0 - 36.0 g/dL   RDW 14.3  11.5 - 15.5 %   Platelets 231  150 - 400 K/uL  BASIC METABOLIC PANEL     Status: Abnormal   Collection Time   01/22/12  5:41 AM      Component Value Range   Sodium 143  135 - 145 mEq/L   Potassium 3.8  3.5 - 5.1 mEq/L   Chloride 99  96 - 112 mEq/L   CO2 35 (*) 19 - 32 mEq/L   Glucose, Bld 103 (*) 70 - 99 mg/dL   BUN 16  6 - 23 mg/dL   Creatinine, Ser 1.18 (*) 0.50 - 1.10 mg/dL   Calcium 9.4  8.4 - 10.5 mg/dL   GFR calc non Af Amer 52 (*) >90 mL/min   GFR calc Af Amer 60 (*) >90 mL/min    Studies/Results: _1 @  Medications: Reviewed.   Patient Active Hospital Problem List:  1.   Atrial fibrillation Patient remains in afib.  Rates avg around 100.  Now on 360 dilt and amiodarone 400 bid.  Plan amio bid for 1 wk the 400 qd.  F/u with J [facility] in 2 wks.    2.  Sleep apnea.  Noncompliant with CPAP  Patient understands importance of this.   3.  Obesity.  Needs to lose wt  4.  DIastolic CHF  Volume status is not bad  Should do better ifrates controlled  LOS: 8 days   Dorris Carnes 01/22/2012, 8:48 AM

## 2012-01-23 DIAGNOSIS — I5032 Chronic diastolic (congestive) heart failure: Secondary | ICD-10-CM

## 2012-01-23 LAB — CBC
HCT: 48.7 % — ABNORMAL HIGH (ref 36.0–46.0)
Hemoglobin: 15.9 g/dL — ABNORMAL HIGH (ref 12.0–15.0)
MCH: 28.4 pg (ref 26.0–34.0)
MCHC: 32.6 g/dL (ref 30.0–36.0)
MCV: 87 fL (ref 78.0–100.0)
Platelets: 230 10*3/uL (ref 150–400)
RBC: 5.6 MIL/uL — ABNORMAL HIGH (ref 3.87–5.11)
RDW: 14.3 % (ref 11.5–15.5)
WBC: 6.9 10*3/uL (ref 4.0–10.5)

## 2012-01-23 MED ORDER — RIVAROXABAN 20 MG PO TABS: 20.0000 mg | ORAL_TABLET | Freq: Every day | ORAL | Status: DC

## 2012-01-23 MED ORDER — DILTIAZEM HCL ER COATED BEADS 360 MG PO CP24: 360.0000 mg | ORAL_CAPSULE | Freq: Every day | ORAL | Status: DC

## 2012-01-23 MED ORDER — AMIODARONE HCL 400 MG PO TABS: 400.0000 mg | ORAL_TABLET | Freq: Two times a day (BID) | ORAL | Status: DC

## 2012-01-23 NOTE — Progress Notes (Signed)
Subjective: No CP or SOB Objective: Filed Vitals:   01/22/12 0949 01/22/12 1531 01/22/12 2045 01/23/12 0706  BP:  117/83 119/69 115/77  Pulse:  79 81 83  Temp:  98 F (36.7 C) 98 F (36.7 C) 97.9 F (36.6 C)  TempSrc:  Oral Oral Oral  Resp:  _0 Height:      Weight:    215 lb 6.2 oz (97.7 kg)  SpO2: 95% 91% 99% 94%   Weight change:   Intake/Output Summary (Last 24 hours) at 01/23/12 0750 Last data filed at 01/22/12 1901  Gross per 24 hour  Intake    220 ml  Output      0 ml  Net    220 ml    General: Alert, awake, oriented x3, in no acute distress Neck:  JVP is normal Heart: Irregular rate and rhythm, without murmurs, rubs, gallops.  Lungs: Clear to auscultation.  No rales or wheezes. Exemities:  No edema.   Neuro: Grossly intact, nonfocal.  Tele:  Afib  Rates 80s to 90s Lab Results: Results for orders placed during the hospital encounter of 01/14/12 (from the past 24 hour(s))  CBC     Status: Abnormal   Collection Time   01/23/12  6:00 AM      Component Value Range   WBC 6.9  4.0 - 10.5 K/uL   RBC 5.60 (*) 3.87 - 5.11 MIL/uL   Hemoglobin 15.9 (*) 12.0 - 15.0 g/dL   HCT 48.7 (*) 36.0 - 46.0 %   MCV 87.0  78.0 - 100.0 fL   MCH 28.4  26.0 - 34.0 pg   MCHC 32.6  30.0 - 36.0 g/dL   RDW 14.3  11.5 - 15.5 %   Platelets 230  150 - 400 K/uL    Studies/Results: _1 @  Medications: Reviewed   Patient Active Hospital Problem List: [place] (07/21/2007)   Assessment: Will try to get mask from [facility].  SHe tolerated it better than nasal mask\ Discussed importance HTN (hypertension) ()   Assessment: Good control  Atrial fibrillation  Rates are good  Continu AMiodarone 400 bid for 1 wk then 400 per day  F/U [facility] in 2 wks.  Acute on chronic diastolic CHF (congestive heart failure), NYHA class 3 (01/14/2012)   Volume is good   D/c today. Dorris Carnes 01/23/2012, 7:50 AM

## 2012-01-24 ENCOUNTER — Encounter (HOSPITAL_COMMUNITY): Payer: Self-pay | Admitting: Cardiology

## 2012-01-25 ENCOUNTER — Ambulatory Visit (HOSPITAL_COMMUNITY)
Admit: 2012-01-25 | Discharge: 2012-01-25 | Disposition: A | Discharge status: Home / Self Care | Payer: BC Managed Care – PPO | Attending: Internal Medicine | Admitting: Internal Medicine

## 2012-01-25 VITALS — BP 116/76 | HR 100 | Wt 216.2 lb

## 2012-01-25 DIAGNOSIS — I4891 Unspecified atrial fibrillation: Secondary | ICD-10-CM

## 2012-01-25 DIAGNOSIS — I5032 Chronic diastolic (congestive) heart failure: Secondary | ICD-10-CM

## 2012-01-25 MED ORDER — FUROSEMIDE 40 MG PO TABS: ORAL_TABLET | ORAL | Status: DC

## 2012-01-25 MED ORDER — AMIODARONE HCL 400 MG PO TABS: 400.0000 mg | ORAL_TABLET | Freq: Two times a day (BID) | ORAL | Status: DC

## 2012-01-25 MED ORDER — AMIODARONE HCL 400 MG PO TABS: 400.0000 mg | ORAL_TABLET | Freq: Every day | ORAL | Status: DC

## 2012-01-25 MED ORDER — POTASSIUM CHLORIDE CRYS ER 20 MEQ PO TBCR: EXTENDED_RELEASE_TABLET | ORAL | Status: DC

## 2012-01-25 NOTE — Progress Notes (Signed)
History of Present Illness: PCP:  Kelby Aline, PA-C (at Dr. Idell Pickles office) Primary Cardiologist:  Dr. [name]  [name] is a 51 y.o. female with a history of hypertension, hyperlipidemia, sleep apnea, glucose intolerance, COPD and GERD as well as paroxysmal atrial fibrillation.  She had done well on flecainide in the past as well as failed Tikosyn due to prolonged QT.   She was admitted 6/2-6/10.  Prior to admission, she had stopped all of her medications due to difficulties with finances.  She had been developing increasing dyspnea requiring several rounds of antibiotics and prednisone taper.  She presented to the hospital with narrow complex tachycardia treated with adenosine x2.  She developed normal sinus rhythm briefly and then converted to atrial flutter with rapid ventricular rate.  Myocardial infarction was ruled out.  She continued to have intermittent episodes of atrial flutter.  She was placed on flecainide.  She was seen by EP.  Given her multiple atrial arrhythmias, ablation therapy was not felt to be helpful and she was continued on flecainide.  She had continued dyspnea and CT scan demonstrated a right lower lobe nonocclusive pulmonary embolism.  She was placed on heparin and Coumadin.  She was also seen by internal medicine for headaches which resolved with Fioricet.  She developed volume overload and was diuresed for acute on chronic diastolic heart failure.  Pulmonary saw the patient and felt that she had an acute on chronic COPD flare and placed on prednisone taper as well as adjusted her inhaled medications.  She has follow up today with pulmonary and will likely have a sleep test scheduled for sleep apnea.    Admitted 01/14/12 with afib with RVR (NSR 09/23/10) and volume overload.  During hospitalization Underwent TEE/DCCV and DCCV  which showed no thrombus.  She failed 2 cardioversions therefore rate control was pursued.  She was diuresed during her stay.  Discharge  weight 215 pounds.  She is here for post hospital follow up.  She feels tired but ok.  She is working on sodium intake.  She was not discharged on lasix or potassium but weight has remained stable since being home.  She denies orthopnea/PND.  She denies dizziness/syncope.  No bleeding.  Compliant with meds.    Past Medical History  Diagnosis Date  . HTN (hypertension)   . Hyperlipidemia   . GERD (gastroesophageal reflux disease)   . Depression   . Anxiety   . Persistent atrial fibrillation     Myoview 4/09: No ischemia;  echo 4/09: EF 60-65%;   Flecainide, beta blocker, Coumadin;    Unable to take Tikosyn 2/2 prolonged QT  . Obstructive sleep apnea   . Borderline diabetes   . Atrial flutter   . Chronic diastolic heart failure   . Pulmonary embolism     Right lower lobe diagnosed by CT 6/12  . COPD (chronic obstructive pulmonary disease)     Current Outpatient Prescriptions  Medication Sig Dispense Refill  . amiodarone (PACERONE) 400 MG tablet Take 1 tablet (400 mg total) by mouth 2 (two) times daily.  60 tablet  1  . diltiazem (CARDIZEM CD) 360 MG 24 hr capsule Take 1 capsule (360 mg total) by mouth daily.  30 capsule  2  . pantoprazole (PROTONIX) 40 MG tablet Take 1 tablet (40 mg total) by mouth daily.  90 tablet  3  . Rivaroxaban (XARELTO) 20 MG TABS Take 1 tablet (20 mg total) by mouth daily.  30 tablet  3  .  tiotropium (SPIRIVA) 18 MCG inhalation capsule Place 18 mcg into inhaler and inhale daily.        Allergies: Allergies  Allergen Reactions  . Sulfonamide Derivatives Hives and Itching     Vital Signs: BP 116/76  Pulse 100  Wt 216 lb 4 oz (98.09 kg)  SpO2 95%  PHYSICAL EXAM: Well nourished, well developed, in no acute distress HEENT: normal Neck: JVD hard to assess Cardiac:  Irregularly irregular, normal S1, S2; no murmur Lungs:  clear, no wheezing, rhonchi or rales Abd: obese. soft, nontender, no hepatomegaly Ext: no edema.  Neuro:  CNs 2-12 intact, no  focal abnormalities noted  EKG: A fib 87 bpms   ASSESSMENT AND PLAN:

## 2012-01-25 NOTE — Patient Instructions (Addendum)
Add lasix and potassium once daily.  May increase to 2 tabs daily for lasix and potassium if weight increasing.   Follow up 3 weeks.    Do the following things EVERYDAY: 1) Weigh yourself in the morning before breakfast. Write it down and keep it in a log. 2) Take your medicines as prescribed 3) Eat low salt foods-Limit salt (sodium) to 2000 mg per day.  4) Stay as active as you can everyday 5) Limit all fluids for the day to less than 2 liters

## 2012-01-27 DIAGNOSIS — I4891 Unspecified atrial fibrillation: Secondary | ICD-10-CM | POA: Insufficient documentation

## 2012-01-27 NOTE — Assessment & Plan Note (Signed)
Volume status ok.  Have had a long discussion concerning low sodium diet, fluid restrictions and daily weights.  Will add lasix 40 mg and potassium 20 mEq daily.

## 2012-01-27 NOTE — Assessment & Plan Note (Signed)
Remains in Afib.  Will decrease amio 400 mg daily per Dr. Jackalyn Lombard recommendations.  May repeat cardioversion in a couple of weeks.  Continue rivaroxaban.

## 2012-02-17 ENCOUNTER — Ambulatory Visit (HOSPITAL_COMMUNITY)
Admission: RE | Admit: 2012-02-17 | Discharge: 2012-02-17 | Disposition: A | Discharge status: Home / Self Care | Payer: BC Managed Care – PPO | Source: Ambulatory Visit | Attending: Internal Medicine | Admitting: Internal Medicine

## 2012-02-17 ENCOUNTER — Encounter (HOSPITAL_COMMUNITY): Payer: Self-pay

## 2012-02-17 VITALS — BP 122/88 | HR 93 | Wt 219.8 lb

## 2012-02-17 DIAGNOSIS — I4891 Unspecified atrial fibrillation: Secondary | ICD-10-CM | POA: Insufficient documentation

## 2012-02-17 DIAGNOSIS — G4733 Obstructive sleep apnea (adult) (pediatric): Secondary | ICD-10-CM | POA: Insufficient documentation

## 2012-02-17 DIAGNOSIS — I5032 Chronic diastolic (congestive) heart failure: Secondary | ICD-10-CM | POA: Insufficient documentation

## 2012-02-17 NOTE — Progress Notes (Signed)
Patient ID: [name], female   DOB: 21-Oct-1960, 52 y.o.   MRN: [id number] History of Present Illness: PCP:  Kelby Aline, PA-C (at Dr. Idell Pickles office) Primary Cardiologist:  Dr. [name] Pulmonologist: Dr Gwenette Greet EP: Dr Rayann Heman  [name] is a 52 y.o. female with a history of hypertension, hyperlipidemia, sleep apnea, glucose intolerance, COPD and GERD as well as paroxysmal atrial fibrillation.  She had done well on flecainide in the past as well as failed Tikosyn due to prolonged QT.   She was admitted 6/2-6/10.  Prior to admission, she had stopped all of her medications due to difficulties with finances.  She had been developing increasing dyspnea requiring several rounds of antibiotics and prednisone taper.  She presented to the hospital with narrow complex tachycardia treated with adenosine x2.  She developed normal sinus rhythm briefly and then converted to atrial flutter with rapid ventricular rate.  Myocardial infarction was ruled out.  She continued to have intermittent episodes of atrial flutter.  She was placed on flecainide.  She was seen by EP.  Given her multiple atrial arrhythmias, ablation therapy was not felt to be helpful and she was continued on flecainide.  She had continued dyspnea and CT scan demonstrated a right lower lobe nonocclusive pulmonary embolism.  She was placed on heparin and Coumadin.  She was also seen by internal medicine for headaches which resolved with Fioricet.  She developed volume overload and was diuresed for acute on chronic diastolic heart failure.  Pulmonary saw the patient and felt that she had an acute on chronic COPD flare and placed on prednisone taper as well as adjusted her inhaled medications.  She has follow up today with pulmonary and will likely have a sleep test scheduled for sleep apnea.    Admitted 01/14/12 with afib with RVR (NSR 09/23/10) and volume overload.  During hospitalization Underwent TEE/DCCV and DCCV  which showed no  thrombus.  She failed 2 cardioversions therefore rate control was pursued.  She was diuresed during her stay.  Discharge weight 215 pounds.  She returns for follow up. Feels much better.  Denies SOB/PND/Orthopnea. No bleeding problems. Weight at home 215-219 pounds. Eats high salt foods at [employer] and [employer]. Works full time at [employer]. Failed sleep study 2 years ago but unable to use CPAP because dog chewed up strap. Follow appointment with Dr Rayann Heman 03/13/12 to discuss possible ablation.      Past Medical History  Diagnosis Date  . HTN (hypertension)   . Hyperlipidemia   . GERD (gastroesophageal reflux disease)   . Depression   . Anxiety   . Persistent atrial fibrillation     Myoview 4/09: No ischemia;  echo 4/09: EF 60-65%;   Flecainide, beta blocker, Coumadin;    Unable to take Tikosyn 2/2 prolonged QT  . Obstructive sleep apnea   . Borderline diabetes   . Atrial flutter   . Chronic diastolic heart failure   . Pulmonary embolism     Right lower lobe diagnosed by CT 6/12  . COPD (chronic obstructive pulmonary disease)     Current Outpatient Prescriptions  Medication Sig Dispense Refill  . amiodarone (PACERONE) 400 MG tablet Take 1 tablet (400 mg total) by mouth daily.  30 tablet  1  . diltiazem (CARDIZEM CD) 360 MG 24 hr capsule Take 1 capsule (360 mg total) by mouth daily.  30 capsule  2  . furosemide (LASIX) 40 MG tablet 1 tab daily and may take extra  tab if weigh climbing  45 tablet  3  . pantoprazole (PROTONIX) 40 MG tablet Take 1 tablet (40 mg total) by mouth daily.  90 tablet  3  . potassium chloride SA (K-DUR,KLOR-CON) 20 MEQ tablet 1 tab daily and add additional tab if you take an extra tablet  45 tablet  3  . Rivaroxaban (XARELTO) 20 MG TABS Take 1 tablet (20 mg total) by mouth daily.  30 tablet  3  . tiotropium (SPIRIVA) 18 MCG inhalation capsule Place 18 mcg into inhaler and inhale daily.        Allergies: Allergies  Allergen Reactions  .  Sulfonamide Derivatives Hives and Itching     Vital Signs: BP 122/88  Pulse 93  Wt 219 lb 12.8 oz (99.701 kg)  SpO2 95%  PHYSICAL EXAM: Well nourished, well developed, in no acute distress HEENT: normal Neck: JVD hard to assess Cardiac:  Irregular, normal S1, S2; no murmur Lungs:  clear, no wheezing, rhonchi or rales Abd: obese. soft, nontender, no hepatomegaly Ext: no edema.  Neuro:  CNs 2-12 intact, no focal abnormalities noted  EKG: A fib 86bpm   ASSESSMENT AND PLAN:

## 2012-02-17 NOTE — Assessment & Plan Note (Addendum)
Needs CPAP but she would like to follow up with Dr Gwenette Greet after A fib issues resolved.

## 2012-02-17 NOTE — Assessment & Plan Note (Addendum)
Continue Xarelto. EKG remains in A Fib with rate controlled. Follow up 03/13/12 with Dr Rayann Heman for possible ablation.

## 2012-02-17 NOTE — Assessment & Plan Note (Addendum)
Volume status stable. Continue current diuretic regimen. Reinforced low salt food choices and daily weights.  Follow up in 2 months to reassess volume status.

## 2012-02-17 NOTE — Patient Instructions (Addendum)
Follow up in 2 months  Do the following things EVERYDAY: 1) Weigh yourself in the morning before breakfast. Write it down and keep it in a log. 2) Take your medicines as prescribed 3) Eat low salt foods-Limit salt (sodium) to 2000 mg per day.  4) Stay as active as you can everyday 5) Limit all fluids for the day to less than 2 liters

## 2012-02-18 NOTE — Addendum Note (Signed)
Encounter addended by: Butch Penny on: 02/18/2012  7:21 AM<BR>     Documentation filed: Charges VN

## 2012-03-13 ENCOUNTER — Ambulatory Visit: Payer: BC Managed Care – PPO | Admitting: Internal Medicine

## 2012-03-31 ENCOUNTER — Encounter: Payer: Self-pay | Admitting: Internal Medicine

## 2012-03-31 ENCOUNTER — Ambulatory Visit (INDEPENDENT_AMBULATORY_CARE_PROVIDER_SITE_OTHER): Payer: BC Managed Care – PPO | Admitting: Internal Medicine

## 2012-03-31 VITALS — BP 122/82 | HR 68 | Ht 59.0 in | Wt 225.0 lb

## 2012-03-31 DIAGNOSIS — I4891 Unspecified atrial fibrillation: Secondary | ICD-10-CM

## 2012-03-31 DIAGNOSIS — I10 Essential (primary) hypertension: Secondary | ICD-10-CM

## 2012-03-31 DIAGNOSIS — G4733 Obstructive sleep apnea (adult) (pediatric): Secondary | ICD-10-CM

## 2012-03-31 MED ORDER — RIVAROXABAN 20 MG PO TABS: 20.0000 mg | ORAL_TABLET | Freq: Every day | ORAL | Status: DC

## 2012-03-31 MED ORDER — AMIODARONE HCL 200 MG PO TABS: 200.0000 mg | ORAL_TABLET | Freq: Every day | ORAL | Status: DC

## 2012-03-31 MED ORDER — FUROSEMIDE 40 MG PO TABS: ORAL_TABLET | ORAL | Status: DC

## 2012-03-31 MED ORDER — DILTIAZEM HCL ER COATED BEADS 360 MG PO CP24: 360.0000 mg | ORAL_CAPSULE | Freq: Every day | ORAL | Status: DC

## 2012-03-31 MED ORDER — POTASSIUM CHLORIDE CRYS ER 20 MEQ PO TBCR: EXTENDED_RELEASE_TABLET | ORAL | Status: DC

## 2012-03-31 NOTE — Progress Notes (Signed)
PCP:  SMITH,MELISSA, PA Primary Cardiologist:: Dr Haroldine Laws  The patient presents today for routine electrophysiology followup.  Since her recent hospital discharge, the patient reports doing reasonably well.   She has converted to sinus rhythm with amiodarone but is not acutely aware of any significant clinical improvement.  Today, she denies symptoms of palpitations, chest pain, shortness of breath, orthopnea, PND, lower extremity edema, dizziness, presyncope, syncope, or neurologic sequela.  The patient feels that she is tolerating medications without difficulties and is otherwise without complaint today.   Past Medical History  Diagnosis Date  . HTN (hypertension)   . Hyperlipidemia   . GERD (gastroesophageal reflux disease)   . Depression   . Anxiety   . Persistent atrial fibrillation     Myoview 4/09: No ischemia;  echo 4/09: EF 60-65%;   Flecainide, beta blocker, Coumadin;    Unable to take Tikosyn 2/2 prolonged QT  . Obstructive sleep apnea   . Borderline diabetes   . Atrial flutter   . Chronic diastolic heart failure   . Pulmonary embolism     Right lower lobe diagnosed by CT 6/12  . COPD (chronic obstructive pulmonary disease)    Past Surgical History  Procedure Laterality Date  . Tubal ligation  1996  . Tee without cardioversion  01/19/2012    Procedure: TRANSESOPHAGEAL ECHOCARDIOGRAM (TEE);  Surgeon: Jolaine Artist, MD;  Location: [facility] ENDOSCOPY;  Service: Cardiovascular;  Laterality: N/A;  . Cardioversion  01/19/2012    Procedure: CARDIOVERSION;  Surgeon: Jolaine Artist, MD;  Location: [facility] ENDOSCOPY;  Service: Cardiovascular;  Laterality: N/A;  . Cardioversion  01/21/2012    Procedure: CARDIOVERSION;  Surgeon: Carlena Bjornstad, MD;  Location: [facility] ENDOSCOPY;  Service: Cardiovascular;  Laterality: N/A;  Rm [id number]    Current Outpatient Prescriptions  Medication Sig Dispense Refill  . amiodarone (PACERONE) 200 MG tablet Take 1 tablet (200 mg total) by mouth daily.   90 tablet  3  . diltiazem (CARDIZEM CD) 360 MG 24 hr capsule Take 1 capsule (360 mg total) by mouth daily.  90 capsule  3  . furosemide (LASIX) 40 MG tablet 1 tab daily and may take extra tab if weigh climbing  135 tablet  3  . pantoprazole (PROTONIX) 40 MG tablet Take 1 tablet (40 mg total) by mouth daily.  90 tablet  3  . potassium chloride SA (K-DUR,KLOR-CON) 20 MEQ tablet 1 tab daily and add additional tab if you take an extra tablet  135 tablet  3  . Rivaroxaban (XARELTO) 20 MG TABS Take 1 tablet (20 mg total) by mouth daily.  90 tablet  3  . tiotropium (SPIRIVA) 18 MCG inhalation capsule Place 18 mcg into inhaler and inhale daily.      . VENTOLIN HFA 108 (90 BASE) MCG/ACT inhaler        No current facility-administered medications for this visit.    Allergies  Allergen Reactions  . Sulfonamide Derivatives Hives and Itching    History   Social History  . Marital Status: Legally Separated    Spouse Name: n/a    Number of Children: 4  . Years of Education: 12   Occupational History  . clerical     [place] History Main Topics  . Smoking status: Former Smoker -- 2.00 packs/day for 35 years    Types: Cigarettes    Quit date: 09/08/2009  . Smokeless tobacco: Never Used  . Alcohol Use: No  . Drug Use: No  .  Sexually Active: No   Other Topics Concern  . Not on file   Social History Narrative   Married but currently separated with 4 children, 2 of whom still live with her.  Youngest son is a Equities trader in [employer], to graduate in 07/2012.    Family History  Problem Relation Age of Onset  . Colon cancer Mother   . Coronary artery disease Mother   . Coronary artery disease Father   . Asthma Father   . Emphysema Father   . Allergies Father   . Heart attack Maternal Grandfather   . Heart disease Maternal Grandmother   . Depression Daughter     ROS-  All systems are reviewed and are negative except as outlined in the HPI above   Physical Exam: Filed  Vitals:   03/31/12 1618  BP: 122/82  Pulse: 68  Height: 4' 11" (1.499 m)  Weight: 225 lb (102.059 kg)    GEN- The patient is overweight appearing, alert and oriented x 3 today.   Head- normocephalic, atraumatic Eyes-  Sclera clear, conjunctiva pink Ears- hearing intact Oropharynx- clear Neck- supple,   Lungs- Clear to ausculation bilaterally, normal work of breathing Heart- Regular rate and rhythm,  GI- soft, NT, ND, + BS Extremities- no clubbing, cyanosis, + dependant edema MS- no significant deformity or atrophy Skin- no rash or lesion Psych- euthymic mood, full affect Neuro- strength and sensation are intact  ekg today reveals sinus rhythm, normal ekg  Assessment and Plan:

## 2012-03-31 NOTE — Assessment & Plan Note (Signed)
The patient has symptomatic persistent atrial fibrillation.  She has converted to sinus rhythm with amiodarone.  She is appropriately anticoagulated. Therapeutic strategies for afib including medicine and ablation were discussed in detail with the patient today. Risk, benefits, and alternatives to EP study and radiofrequency ablation for afib were also discussed in detail today.  At this time, she is clear that she would like to avoid ablation.  She would prefer to continue medical therapy.   I will decrease amiodarone to 270m daily today.  She will continue anticoagulation long term.  If she decides to pursue ablation, she will contact our office.

## 2012-03-31 NOTE — Assessment & Plan Note (Signed)
Weight loss and CPAP are encouraged

## 2012-03-31 NOTE — Assessment & Plan Note (Signed)
Stable No change required today  

## 2012-03-31 NOTE — Patient Instructions (Signed)
Your physician recommends that you schedule a follow-up appointment in: 3 months with Dr Rayann Heman    Your physician has recommended you make the following change in your medication:  1) Decrease Amiodarone to 259m daily

## 2012-04-19 ENCOUNTER — Ambulatory Visit (HOSPITAL_COMMUNITY)
Admission: RE | Admit: 2012-04-19 | Discharge: 2012-04-19 | Disposition: A | Discharge status: Home / Self Care | Payer: BC Managed Care – PPO | Source: Ambulatory Visit | Attending: Internal Medicine | Admitting: Internal Medicine

## 2012-04-19 ENCOUNTER — Encounter (HOSPITAL_COMMUNITY): Payer: Self-pay

## 2012-04-19 VITALS — BP 122/86 | HR 84 | Wt 229.0 lb

## 2012-04-19 DIAGNOSIS — G4733 Obstructive sleep apnea (adult) (pediatric): Secondary | ICD-10-CM

## 2012-04-19 DIAGNOSIS — I5032 Chronic diastolic (congestive) heart failure: Secondary | ICD-10-CM

## 2012-04-19 DIAGNOSIS — I509 Heart failure, unspecified: Secondary | ICD-10-CM | POA: Insufficient documentation

## 2012-04-19 DIAGNOSIS — I4891 Unspecified atrial fibrillation: Secondary | ICD-10-CM

## 2012-04-19 NOTE — Progress Notes (Signed)
History of Present Illness: PCP:  Kelby Aline, PA-C (at Dr. Idell Pickles office) Primary Cardiologist:  Dr. [name] Pulmonologist: Dr Gwenette Greet EP: Dr Rayann Heman  [name] is a 52 y.o. female with a history of hypertension, hyperlipidemia, sleep apnea, glucose intolerance, COPD and GERD as well as paroxysmal atrial fibrillation.  She had done well on flecainide in the past as well as failed Tikosyn due to prolonged QT.   She was admitted 6/2-6/10.  Prior to admission, she had stopped all of her medications due to difficulties with finances.  She had been developing increasing dyspnea requiring several rounds of antibiotics and prednisone taper.  She presented to the hospital with narrow complex tachycardia treated with adenosine x2.  She developed normal sinus rhythm briefly and then converted to atrial flutter with rapid ventricular rate.  Myocardial infarction was ruled out.  She continued to have intermittent episodes of atrial flutter.  She was placed on flecainide.  She was seen by EP.  Given her multiple atrial arrhythmias, ablation therapy was not felt to be helpful and she was continued on flecainide.  She had continued dyspnea and CT scan demonstrated a right lower lobe nonocclusive pulmonary embolism.  She was placed on heparin and Coumadin.  She was also seen by internal medicine for headaches which resolved with Fioricet.  She developed volume overload and was diuresed for acute on chronic diastolic heart failure.  Pulmonary saw the patient and felt that she had an acute on chronic COPD flare and placed on prednisone taper as well as adjusted her inhaled medications.  She has follow up today with pulmonary and will likely have a sleep test scheduled for sleep apnea.    Admitted 01/14/12 with afib with RVR (NSR 09/23/10) and volume overload.  During hospitalization Underwent TEE/DCCV and DCCV  which showed no thrombus.  She failed 2 cardioversions therefore rate control was pursued.  She  was diuresed during her stay.  Discharge weight 215 pounds.  She returns for follow up today.  She feels well but says that she knows her weight is up.  Although, she has not been weighing daily.  She says she is eating a lot and not following sodium restrictions. She has not exercising.  She had follow up with Dr. Rayann Heman recently and she has returned to [place] on amiodarone.   Working on paying off [place] to get mask for CPAP.     ROS: All pertinent positives and negatives as in HPI, otherwise negative.     Past Medical History  Diagnosis Date  . HTN (hypertension)   . Hyperlipidemia   . GERD (gastroesophageal reflux disease)   . Depression   . Anxiety   . Persistent atrial fibrillation     Myoview 4/09: No ischemia;  echo 4/09: EF 60-65%;   Flecainide, beta blocker, Coumadin;    Unable to take Tikosyn 2/2 prolonged QT  . Obstructive sleep apnea   . Borderline diabetes   . Atrial flutter   . Chronic diastolic heart failure   . Pulmonary embolism     Right lower lobe diagnosed by CT 6/12  . COPD (chronic obstructive pulmonary disease)     Current Outpatient Prescriptions  Medication Sig Dispense Refill  . amiodarone (PACERONE) 200 MG tablet Take 1 tablet (200 mg total) by mouth daily.  90 tablet  3  . diltiazem (CARDIZEM CD) 360 MG 24 hr capsule Take 1 capsule (360 mg total) by mouth daily.  90 capsule  3  . furosemide (LASIX)  40 MG tablet 1 tab daily and may take extra tab if weigh climbing  135 tablet  3  . pantoprazole (PROTONIX) 40 MG tablet Take 1 tablet (40 mg total) by mouth daily.  90 tablet  3  . potassium chloride SA (K-DUR,KLOR-CON) 20 MEQ tablet 1 tab daily and add additional tab if you take an extra tablet  135 tablet  3  . Rivaroxaban (XARELTO) 20 MG TABS Take 1 tablet (20 mg total) by mouth daily.  90 tablet  3  . tiotropium (SPIRIVA) 18 MCG inhalation capsule Place 18 mcg into inhaler and inhale daily.      . VENTOLIN HFA 108 (90 BASE) MCG/ACT inhaler        No current  facility-administered medications for this encounter.    Allergies: Allergies  Allergen Reactions  . Sulfonamide Derivatives Hives and Itching     Vital Signs: BP 122/86  Pulse 84  Wt 229 lb (103.874 kg)  BMI 46.23 kg/m2  SpO2 96%  PHYSICAL EXAM: Well nourished, well developed, in no acute distress HEENT: normal Neck: JVD hard to assess Cardiac:  Irregular, normal S1, S2; no murmur Lungs:  clear, no wheezing, rhonchi or rales Abd: obese. soft, nontender, mild distention, no hepatomegaly Ext: tr-1+ edema.  Neuro:  CNs 2-12 intact, no focal abnormalities noted    ASSESSMENT AND PLAN:

## 2012-04-19 NOTE — Patient Instructions (Addendum)
Increase lasix and potassium to 2 tabs daily for 3 days.  Then return to 1 tab daily.  May increase if weight goes up or swelling.  Follow up 1 month.   Do the following things EVERYDAY: 1) Weigh yourself in the morning before breakfast. Write it down and keep it in a log. 2) Take your medicines as prescribed 3) Eat low salt foods-Limit salt (sodium) to 2000 mg per day.  4) Stay as active as you can everyday 5) Limit all fluids for the day to less than 2 liters

## 2012-04-23 NOTE — Assessment & Plan Note (Addendum)
Back in NSR.  Continue amiodarone and xarelto.  Follow up with Dr. Rayann Heman.

## 2012-04-23 NOTE — Assessment & Plan Note (Signed)
She is going to try and get a mask for her CPAP.

## 2012-04-23 NOTE — Assessment & Plan Note (Addendum)
Volume status elevated in setting of dietary indiscretions and not weighing daily.  Have re-educated on low sodium diet and daily weights.  Will increase lasix 40 mg BID for 3 days and return to 40 mg daily if weight/swelling has improved.  Will follow closely in clinic.

## 2012-05-08 ENCOUNTER — Telehealth (HOSPITAL_COMMUNITY): Payer: Self-pay | Admitting: Cardiology

## 2012-05-08 DIAGNOSIS — I4891 Unspecified atrial fibrillation: Secondary | ICD-10-CM

## 2012-05-08 MED ORDER — RIVAROXABAN 20 MG PO TABS: 20.0000 mg | ORAL_TABLET | Freq: Every day | ORAL | Status: DC

## 2012-05-08 MED ORDER — POTASSIUM CHLORIDE CRYS ER 20 MEQ PO TBCR: EXTENDED_RELEASE_TABLET | ORAL | Status: DC

## 2012-05-08 MED ORDER — FUROSEMIDE 40 MG PO TABS: ORAL_TABLET | ORAL | Status: DC

## 2012-05-08 MED ORDER — DILTIAZEM HCL ER COATED BEADS 360 MG PO CP24: 360.0000 mg | ORAL_CAPSULE | Freq: Every day | ORAL | Status: DC

## 2012-05-08 MED ORDER — AMIODARONE HCL 200 MG PO TABS: 200.0000 mg | ORAL_TABLET | Freq: Every day | ORAL | Status: DC

## 2012-05-08 NOTE — Telephone Encounter (Signed)
Pt called to request that all meds per sent to [facility] as a 90 day supply

## 2012-05-31 ENCOUNTER — Ambulatory Visit (HOSPITAL_COMMUNITY)
Admission: RE | Admit: 2012-05-31 | Discharge: 2012-05-31 | Disposition: A | Discharge status: Home / Self Care | Payer: BC Managed Care – PPO | Source: Ambulatory Visit | Attending: Internal Medicine | Admitting: Internal Medicine

## 2012-05-31 VITALS — BP 140/80 | HR 124 | Wt 228.0 lb

## 2012-05-31 DIAGNOSIS — I4891 Unspecified atrial fibrillation: Secondary | ICD-10-CM | POA: Insufficient documentation

## 2012-05-31 DIAGNOSIS — I5032 Chronic diastolic (congestive) heart failure: Secondary | ICD-10-CM | POA: Insufficient documentation

## 2012-05-31 LAB — BASIC METABOLIC PANEL
BUN: 19 mg/dL (ref 6–23)
CO2: 29 mEq/L (ref 19–32)
Calcium: 9.7 mg/dL (ref 8.4–10.5)
Chloride: 103 mEq/L (ref 96–112)
Creatinine, Ser: 1.24 mg/dL — ABNORMAL HIGH (ref 0.50–1.10)
GFR calc Af Amer: 56 mL/min — ABNORMAL LOW (ref >90)
GFR calc non Af Amer: 49 mL/min — ABNORMAL LOW (ref >90)
Glucose, Bld: 97 mg/dL (ref 70–99)
Potassium: 3.6 mEq/L (ref 3.5–5.1)
Sodium: 143 mEq/L (ref 135–145)

## 2012-05-31 MED ORDER — FUROSEMIDE 40 MG PO TABS: ORAL_TABLET | ORAL | Status: DC

## 2012-05-31 MED ORDER — RIVAROXABAN 20 MG PO TABS: 20.0000 mg | ORAL_TABLET | Freq: Every day | ORAL | Status: DC

## 2012-05-31 MED ORDER — DILTIAZEM HCL ER COATED BEADS 360 MG PO CP24: 360.0000 mg | ORAL_CAPSULE | Freq: Every day | ORAL | Status: DC

## 2012-05-31 MED ORDER — POTASSIUM CHLORIDE CRYS ER 20 MEQ PO TBCR: EXTENDED_RELEASE_TABLET | ORAL | Status: DC

## 2012-05-31 MED ORDER — AMIODARONE HCL 200 MG PO TABS: 200.0000 mg | ORAL_TABLET | Freq: Every day | ORAL | Status: DC

## 2012-05-31 NOTE — Assessment & Plan Note (Signed)
Will re-order diltiazem today, she will start them when they come in, hopefully this week.

## 2012-05-31 NOTE — Assessment & Plan Note (Addendum)
Volume status well controlled with lasix and additional sliding scale lasix.  Have encouraged her to start weighing again, she will do this.  Have sent in all her prescriptions to [facility].  Check BMET today.

## 2012-05-31 NOTE — Patient Instructions (Addendum)
Continue using extra lasix and potassium as needed.  Follow up 2 months.  Labs today.  Do the following things EVERYDAY: 1) Weigh yourself in the morning before breakfast. Write it down and keep it in a log. 2) Take your medicines as prescribed 3) Eat low salt foods-Limit salt (sodium) to 2000 mg per day.  4) Stay as active as you can everyday 5) Limit all fluids for the day to less than 2 liters

## 2012-05-31 NOTE — Progress Notes (Signed)
History of Present Illness: PCP:  Kelby Aline, PA-C (at Dr. Idell Pickles office) Primary Cardiologist:  Dr. [name] Pulmonologist: Dr Gwenette Greet EP: Dr Rayann Heman  [name] is a 52 y.o. female with a history of hypertension, hyperlipidemia, sleep apnea, glucose intolerance, COPD and GERD as well as paroxysmal atrial fibrillation.  She had done well on flecainide in the past as well as failed Tikosyn due to prolonged QT.   She was admitted 6/2-6/10.  Prior to admission, she had stopped all of her medications due to difficulties with finances.  She had been developing increasing dyspnea requiring several rounds of antibiotics and prednisone taper.  She presented to the hospital with narrow complex tachycardia treated with adenosine x2.  She developed normal sinus rhythm briefly and then converted to atrial flutter with rapid ventricular rate.  Myocardial infarction was ruled out.  She continued to have intermittent episodes of atrial flutter.  She was placed on flecainide.  She was seen by EP.  Given her multiple atrial arrhythmias, ablation therapy was not felt to be helpful and she was continued on flecainide.  She had continued dyspnea and CT scan demonstrated a right lower lobe nonocclusive pulmonary embolism.  She was placed on heparin and Coumadin.  She was also seen by internal medicine for headaches which resolved with Fioricet.  She developed volume overload and was diuresed for acute on chronic diastolic heart failure.  Pulmonary saw the patient and felt that she had an acute on chronic COPD flare and placed on prednisone taper as well as adjusted her inhaled medications.  She has follow up today with pulmonary and will likely have a sleep test scheduled for sleep apnea.    Admitted 01/14/12 with afib with RVR (NSR 09/23/10) and volume overload.  During hospitalization Underwent TEE/DCCV and DCCV  which showed no thrombus.  She failed 2 cardioversions therefore rate control was pursued.  She  was diuresed during her stay.  Discharge weight 215 pounds.  She returns for follow up today.  She feels well.  She does have a lot of increased stress at work right now due to new projects, works at [employer].  She says she is trying really hard to watch her fluid and sodium intake.  She uses extra lasix ~2 x per week.  She denies orthopnea, PND or dyspnea.  She is walking a block or 2 with an incline, she does get a little winded but does not have to stop.  She denies edema.    ROS: All pertinent positives and negatives as in HPI, otherwise negative.     Past Medical History  Diagnosis Date  . HTN (hypertension)   . Hyperlipidemia   . GERD (gastroesophageal reflux disease)   . Depression   . Anxiety   . Persistent atrial fibrillation     Myoview 4/09: No ischemia;  echo 4/09: EF 60-65%;   Flecainide, beta blocker, Coumadin;    Unable to take Tikosyn 2/2 prolonged QT  . Obstructive sleep apnea   . Borderline diabetes   . Atrial flutter   . Chronic diastolic heart failure   . Pulmonary embolism     Right lower lobe diagnosed by CT 6/12  . COPD (chronic obstructive pulmonary disease)     Current Outpatient Prescriptions  Medication Sig Dispense Refill  . amiodarone (PACERONE) 200 MG tablet Take 1 tablet (200 mg total) by mouth daily.  90 tablet  3  . diphenhydrAMINE (BENADRYL) 50 MG capsule Take 50 mg by mouth at  bedtime as needed for itching.      . furosemide (LASIX) 40 MG tablet 1 tab daily and may take extra tab if weigh climbing  135 tablet  3  . pantoprazole (PROTONIX) 40 MG tablet Take 1 tablet (40 mg total) by mouth daily.  90 tablet  3  . potassium chloride SA (K-DUR,KLOR-CON) 20 MEQ tablet 1 tab daily and add additional tab if you take an extra tablet  135 tablet  3  . Rivaroxaban (XARELTO) 20 MG TABS Take 1 tablet (20 mg total) by mouth daily.  90 tablet  3  . tiotropium (SPIRIVA) 18 MCG inhalation capsule Place 18 mcg into inhaler and inhale daily.      .  VENTOLIN HFA 108 (90 BASE) MCG/ACT inhaler        No current facility-administered medications for this encounter.    Allergies: Allergies  Allergen Reactions  . Sulfonamide Derivatives Hives and Itching     Vital Signs: BP 140/80  Pulse 124  Wt 228 lb (103.42 kg)  BMI 46.03 kg/m2  SpO2 97%  PHYSICAL EXAM: Well nourished, well developed, in no acute distress HEENT: normal Neck: JVD hard to assess Cardiac:  Irregular, normal S1, S2; no murmur Lungs:  clear, no wheezing, rhonchi or rales Abd: obese. soft, nontender, mild distention, no hepatomegaly Ext: tr edema.  Neuro:  CNs 2-12 intact, no focal abnormalities noted    ASSESSMENT AND PLAN:

## 2012-06-23 ENCOUNTER — Ambulatory Visit: Payer: BC Managed Care – PPO | Admitting: Internal Medicine

## 2012-07-19 ENCOUNTER — Encounter: Payer: Self-pay | Admitting: Internal Medicine

## 2012-07-19 ENCOUNTER — Ambulatory Visit (INDEPENDENT_AMBULATORY_CARE_PROVIDER_SITE_OTHER): Payer: BC Managed Care – PPO | Admitting: Internal Medicine

## 2012-07-19 VITALS — BP 133/84 | HR 71 | Ht 59.0 in | Wt 229.8 lb

## 2012-07-19 DIAGNOSIS — I2699 Other pulmonary embolism without acute cor pulmonale: Secondary | ICD-10-CM

## 2012-07-19 DIAGNOSIS — I5032 Chronic diastolic (congestive) heart failure: Secondary | ICD-10-CM

## 2012-07-19 DIAGNOSIS — I4891 Unspecified atrial fibrillation: Secondary | ICD-10-CM

## 2012-07-19 DIAGNOSIS — I10 Essential (primary) hypertension: Secondary | ICD-10-CM

## 2012-07-19 NOTE — Progress Notes (Signed)
PCP: Kelby Aline, R, PA-C Primary Cardiologist:  Dr Haroldine Laws  [name] is a 52 y.o. female who presents today for routine electrophysiology followup.  Since last being seen in our clinic, the patient reports doing very well.  She is unaware of any afib.  Today, she denies symptoms of palpitations, chest pain, shortness of breath,  lower extremity edema, dizziness, presyncope, or syncope.  The patient is otherwise without complaint today.   Past Medical History  Diagnosis Date  . HTN (hypertension)   . Hyperlipidemia   . GERD (gastroesophageal reflux disease)   . Depression   . Anxiety   . Persistent atrial fibrillation     Myoview 4/09: No ischemia;  echo 4/09: EF 60-65%;   Flecainide, beta blocker, Coumadin;    Unable to take Tikosyn 2/2 prolonged QT  . Obstructive sleep apnea   . Borderline diabetes   . Atrial flutter   . Chronic diastolic heart failure   . Pulmonary embolism     Right lower lobe diagnosed by CT 6/12  . COPD (chronic obstructive pulmonary disease)    Past Surgical History  Procedure Laterality Date  . Tubal ligation  1996  . Tee without cardioversion  01/19/2012    Procedure: TRANSESOPHAGEAL ECHOCARDIOGRAM (TEE);  Surgeon: Jolaine Artist, MD;  Location: [facility] ENDOSCOPY;  Service: Cardiovascular;  Laterality: N/A;  . Cardioversion  01/19/2012    Procedure: CARDIOVERSION;  Surgeon: Jolaine Artist, MD;  Location: [facility] ENDOSCOPY;  Service: Cardiovascular;  Laterality: N/A;  . Cardioversion  01/21/2012    Procedure: CARDIOVERSION;  Surgeon: Carlena Bjornstad, MD;  Location: [facility] ENDOSCOPY;  Service: Cardiovascular;  Laterality: N/A;  Rm [id number]    Current Outpatient Prescriptions  Medication Sig Dispense Refill  . amiodarone (PACERONE) 200 MG tablet Take 1 tablet (200 mg total) by mouth daily.  90 tablet  3  . diltiazem (CARDIZEM CD) 360 MG 24 hr capsule Take 1 capsule (360 mg total) by mouth daily.  90 capsule  3  . diphenhydrAMINE (BENADRYL) 50 MG capsule  Take 50 mg by mouth at bedtime as needed for itching.      . furosemide (LASIX) 40 MG tablet 1 tab daily and may take extra tab if weigh climbing  135 tablet  3  . pantoprazole (PROTONIX) 40 MG tablet Take 1 tablet (40 mg total) by mouth daily.  90 tablet  3  . potassium chloride SA (K-DUR,KLOR-CON) 20 MEQ tablet 1 tab daily and add additional tab if you take an extra tablet  135 tablet  3  . Rivaroxaban (XARELTO) 20 MG TABS Take 1 tablet (20 mg total) by mouth daily.  90 tablet  3  . tiotropium (SPIRIVA) 18 MCG inhalation capsule Place 18 mcg into inhaler and inhale daily.      . VENTOLIN HFA 108 (90 BASE) MCG/ACT inhaler        No current facility-administered medications for this visit.    Physical Exam: Filed Vitals:   07/19/12 1603  BP: 133/84  Pulse: 71  Height: _0  (1.499 m)  Weight: 229 lb 12.8 oz (104.237 kg)    GEN- The patient is overweight appearing, alert and oriented x 3 today.   Head- normocephalic, atraumatic Eyes-  Sclera clear, conjunctiva pink Ears- hearing intact Oropharynx- clear Lungs- Clear to ausculation bilaterally, normal work of breathing Heart- Regular rate and rhythm, no murmurs, rubs or gallops, PMI not laterally displaced GI- soft, NT, ND, + BS Extremities- no clubbing, cyanosis, or edema  ekg  today reveals sinus rhythm 72bpm, LAA  Assessment and Plan:  1. afib Well controlled with amiodarone Check LFTs/TFTs today Continue xarelto Cbc and bmet today Consider decreasing amiodarone to 129m daily long term  2. Obesity Weight loss is strongly advised  3. HTN Stable No change required today  4. Prior PTE On xarelto  Return in 3 months

## 2012-07-19 NOTE — Addendum Note (Signed)
Addended by: Janan Halter F on: 07/19/2012 04:27 PM   Modules accepted: Orders

## 2012-07-19 NOTE — Patient Instructions (Addendum)
Your physician wants you to follow-up in: 6 months with Dr Vallery Ridge will receive a reminder letter in the mail two months in advance. If you don't receive a letter, please call our office to schedule the follow-up appointment.   Your physician recommends that you return for lab today   Your physician recommends that you continue on your current medications as directed. Please refer to the Current Medication list given to you today.

## 2012-07-20 LAB — HEPATIC FUNCTION PANEL
ALT: 40 U/L — ABNORMAL HIGH (ref 0–35)
AST: 22 U/L (ref 0–37)
Albumin: 3.9 g/dL (ref 3.5–5.2)
Alkaline Phosphatase: 77 U/L (ref 39–117)
Bilirubin, Direct: 0 mg/dL (ref 0.0–0.3)
Total Bilirubin: 0.4 mg/dL (ref 0.3–1.2)
Total Protein: 7 g/dL (ref 6.0–8.3)

## 2012-07-20 LAB — CBC WITH DIFFERENTIAL/PLATELET
Basophils Absolute: 0 10*3/uL (ref 0.0–0.1)
Basophils Relative: 0.4 % (ref 0.0–3.0)
Eosinophils Absolute: 0.1 10*3/uL (ref 0.0–0.7)
Eosinophils Relative: 1.3 % (ref 0.0–5.0)
HCT: 46.6 % — ABNORMAL HIGH (ref 36.0–46.0)
Hemoglobin: 15.3 g/dL — ABNORMAL HIGH (ref 12.0–15.0)
Lymphocytes Relative: 32.7 % (ref 12.0–46.0)
Lymphs Abs: 2.2 10*3/uL (ref 0.7–4.0)
MCHC: 32.8 g/dL (ref 30.0–36.0)
MCV: 88 fl (ref 78.0–100.0)
Monocytes Absolute: 0.4 10*3/uL (ref 0.1–1.0)
Monocytes Relative: 6.5 % (ref 3.0–12.0)
Neutro Abs: 4 10*3/uL (ref 1.4–7.7)
Neutrophils Relative %: 59.1 % (ref 43.0–77.0)
Platelets: 253 10*3/uL (ref 150.0–400.0)
RBC: 5.3 Mil/uL — ABNORMAL HIGH (ref 3.87–5.11)
RDW: 13.9 % (ref 11.5–14.6)
WBC: 6.7 10*3/uL (ref 4.5–10.5)

## 2012-07-20 LAB — BASIC METABOLIC PANEL
BUN: 19 mg/dL (ref 6–23)
CO2: 32 mEq/L (ref 19–32)
Calcium: 9.3 mg/dL (ref 8.4–10.5)
Chloride: 104 mEq/L (ref 96–112)
Creatinine, Ser: 1.2 mg/dL (ref 0.4–1.2)
GFR: 49.83 mL/min — ABNORMAL LOW (ref >60.00)
Glucose, Bld: 93 mg/dL (ref 70–99)
Potassium: 3.8 mEq/L (ref 3.5–5.1)
Sodium: 141 mEq/L (ref 135–145)

## 2012-07-20 LAB — T4, FREE: Free T4: 1.14 ng/dL (ref 0.60–1.60)

## 2012-07-20 LAB — TSH: TSH: 6.06 u[IU]/mL — ABNORMAL HIGH (ref 0.35–5.50)

## 2012-07-31 ENCOUNTER — Encounter (HOSPITAL_COMMUNITY): Payer: Self-pay

## 2012-07-31 ENCOUNTER — Ambulatory Visit (HOSPITAL_COMMUNITY)
Admission: RE | Admit: 2012-07-31 | Discharge: 2012-07-31 | Disposition: A | Discharge status: Home / Self Care | Payer: BC Managed Care – PPO | Source: Ambulatory Visit | Attending: Internal Medicine | Admitting: Internal Medicine

## 2012-07-31 VITALS — BP 100/70 | HR 62 | Wt 230.8 lb

## 2012-07-31 DIAGNOSIS — F3289 Other specified depressive episodes: Secondary | ICD-10-CM | POA: Insufficient documentation

## 2012-07-31 DIAGNOSIS — J4489 Other specified chronic obstructive pulmonary disease: Secondary | ICD-10-CM | POA: Insufficient documentation

## 2012-07-31 DIAGNOSIS — I4891 Unspecified atrial fibrillation: Secondary | ICD-10-CM | POA: Insufficient documentation

## 2012-07-31 DIAGNOSIS — I5033 Acute on chronic diastolic (congestive) heart failure: Secondary | ICD-10-CM

## 2012-07-31 DIAGNOSIS — Z86711 Personal history of pulmonary embolism: Secondary | ICD-10-CM | POA: Insufficient documentation

## 2012-07-31 DIAGNOSIS — K219 Gastro-esophageal reflux disease without esophagitis: Secondary | ICD-10-CM | POA: Insufficient documentation

## 2012-07-31 DIAGNOSIS — E739 Lactose intolerance, unspecified: Secondary | ICD-10-CM | POA: Insufficient documentation

## 2012-07-31 DIAGNOSIS — F411 Generalized anxiety disorder: Secondary | ICD-10-CM | POA: Insufficient documentation

## 2012-07-31 DIAGNOSIS — F329 Major depressive disorder, single episode, unspecified: Secondary | ICD-10-CM | POA: Insufficient documentation

## 2012-07-31 DIAGNOSIS — J449 Chronic obstructive pulmonary disease, unspecified: Secondary | ICD-10-CM | POA: Insufficient documentation

## 2012-07-31 DIAGNOSIS — E785 Hyperlipidemia, unspecified: Secondary | ICD-10-CM | POA: Insufficient documentation

## 2012-07-31 DIAGNOSIS — I5032 Chronic diastolic (congestive) heart failure: Secondary | ICD-10-CM

## 2012-07-31 DIAGNOSIS — G4733 Obstructive sleep apnea (adult) (pediatric): Secondary | ICD-10-CM | POA: Insufficient documentation

## 2012-07-31 DIAGNOSIS — G4730 Sleep apnea, unspecified: Secondary | ICD-10-CM | POA: Insufficient documentation

## 2012-07-31 DIAGNOSIS — I5022 Chronic systolic (congestive) heart failure: Secondary | ICD-10-CM | POA: Insufficient documentation

## 2012-07-31 DIAGNOSIS — I10 Essential (primary) hypertension: Secondary | ICD-10-CM | POA: Insufficient documentation

## 2012-07-31 DIAGNOSIS — R7309 Other abnormal glucose: Secondary | ICD-10-CM | POA: Insufficient documentation

## 2012-07-31 NOTE — Assessment & Plan Note (Addendum)
Reviewed most recent BMET from 07/19/2012. Renal function stable.  Volume status stable. Continue current diuretic regimen. Reinforced daily weights, low salt food choices, and limiting fluid intake< 2 liters. Encouraged to walk daily. Follow up in 3-4 months.

## 2012-07-31 NOTE — Progress Notes (Signed)
Patient ID: [name], female   DOB: 1960/12/22, 52 y.o.   MRN: [id number] History of Present Illness: PCP:  Kelby Aline, PA-C (at Dr. Idell Pickles office) Primary Cardiologist:  Dr. [name] Pulmonologist: Dr Gwenette Greet EP: Dr Rayann Heman  [name] is a 52 y.o. female with a history of chronic diastolic heart failure, HTN, hyperlipidemia, sleep apnea, glucose intolerance, COPD and GERD as well as paroxysmal A fib.     She was admitted 6/2-6/11/2010.  Prior to admission, she had stopped all of her medications due to difficulties with finances.  She had been developing increasing dyspnea requiring several rounds of antibiotics and prednisone taper.  She presented to the hospital with narrow complex tachycardia treated with adenosine x2.  She developed normal sinus rhythm briefly and then converted to atrial flutter with rapid ventricular rate.  Myocardial infarction was ruled out.  She continued to have intermittent episodes of atrial flutter.  She was placed on flecainide.   She was seen by EP.  Given her multiple atrial arrhythmias, ablation therapy was not felt to be helpful and she was continued on flecainide.  She had continued dyspnea and CT scan demonstrated a right lower lobe nonocclusive pulmonary embolism.  She was placed on heparin and Coumadin.    Admitted 01/14/12 with afib with RVR (NSR 09/23/10) and volume overload.  During hospitalization Underwent TEE/DCCV and DCCV  which showed no thrombus.  She failed 2 cardioversions therefore rate control was pursued.  She was diuresed during her stay. Flecainide was stopped and transitioned to cardizem and amiodarone. She was also placed on Xarelto. Discharge weight 215 pounds.  She was back in a NSR 03/31/12   07/19/12 NSR 72 pbm  She returns for follow up today.  She was evaluated by Dr Rayann Heman 07/19/12 and amiodarone was cut back to 200 mg daily. Denies SOB/PND/Orthopnea. No bleeding problems. She only takes extra lasix if she eats [place]  food. SOB walking up steps. She does not weigh at home.  Compliant with medications. Not exercising.   07/31/12 TSH 6.06 T4 1.14  ALT 40   ROS: All pertinent positives and negatives as in HPI, otherwise negative.     Past Medical History  Diagnosis Date  . HTN (hypertension)   . Hyperlipidemia   . GERD (gastroesophageal reflux disease)   . Depression   . Anxiety   . Persistent atrial fibrillation     Myoview 4/09: No ischemia;  echo 4/09: EF 60-65%;   Flecainide, beta blocker, Coumadin;    Unable to take Tikosyn 2/2 prolonged QT  . Obstructive sleep apnea   . Borderline diabetes   . Atrial flutter   . Chronic diastolic heart failure   . Pulmonary embolism     Right lower lobe diagnosed by CT 6/12  . COPD (chronic obstructive pulmonary disease)     Current Outpatient Prescriptions  Medication Sig Dispense Refill  . amiodarone (PACERONE) 200 MG tablet Take 1 tablet (200 mg total) by mouth daily.  90 tablet  3  . diltiazem (CARDIZEM CD) 360 MG 24 hr capsule Take 1 capsule (360 mg total) by mouth daily.  90 capsule  3  . diphenhydrAMINE (BENADRYL) 50 MG capsule Take 50 mg by mouth at bedtime as needed for itching.      . furosemide (LASIX) 40 MG tablet 1 tab daily and may take extra tab if weigh climbing  135 tablet  3  . pantoprazole (PROTONIX) 40 MG tablet Take 1 tablet (40 mg total) by  mouth daily.  90 tablet  3  . potassium chloride SA (K-DUR,KLOR-CON) 20 MEQ tablet 1 tab daily and add additional tab if you take an extra tablet  135 tablet  3  . Rivaroxaban (XARELTO) 20 MG TABS Take 1 tablet (20 mg total) by mouth daily.  90 tablet  3  . tiotropium (SPIRIVA) 18 MCG inhalation capsule Place 18 mcg into inhaler and inhale daily.      . VENTOLIN HFA 108 (90 BASE) MCG/ACT inhaler        No current facility-administered medications for this encounter.    Allergies: Allergies  Allergen Reactions  . Sulfonamide Derivatives Hives and Itching     Vital Signs: BP 100/70  Pulse  62  Wt 230 lb 12.8 oz (104.69 kg)  BMI 46.59 kg/m2  SpO2 95%  PHYSICAL EXAM: Well nourished, well developed, in no acute distress HEENT: normal Neck: JVD hard to assess Cardiac:  Regular, normal S1, S2; no murmur Lungs:  clear, no wheezing, rhonchi or rales Abd: obese. soft, nontender, mild distention, no hepatomegaly Ext: tr edema.  Neuro:  CNs 2-12 intact, no focal abnormalities noted    ASSESSMENT AND PLAN:

## 2012-07-31 NOTE — Assessment & Plan Note (Addendum)
Remains in NSR. Continue current dose of amiodarone/Xarelto per Dr Rayann Heman. She will follow up with PCP regarding TSH.

## 2012-07-31 NOTE — Patient Instructions (Addendum)
Follow up in 3-4 months  Do the following things EVERYDAY: 1) Weigh yourself in the morning before breakfast. Write it down and keep it in a log. 2) Take your medicines as prescribed 3) Eat low salt foods-Limit salt (sodium) to 2000 mg per day.  4) Stay as active as you can everyday 5) Limit all fluids for the day to less than 2 liters

## 2012-11-01 ENCOUNTER — Ambulatory Visit (INDEPENDENT_AMBULATORY_CARE_PROVIDER_SITE_OTHER): Payer: BC Managed Care – PPO | Admitting: Family Medicine

## 2012-11-01 VITALS — BP 142/88 | HR 90 | Temp 102.0°F | Resp 19 | Ht 59.0 in | Wt 236.0 lb

## 2012-11-01 DIAGNOSIS — R220 Localized swelling, mass and lump, head: Secondary | ICD-10-CM

## 2012-11-01 DIAGNOSIS — H6120 Impacted cerumen, unspecified ear: Secondary | ICD-10-CM

## 2012-11-01 DIAGNOSIS — R509 Fever, unspecified: Secondary | ICD-10-CM

## 2012-11-01 DIAGNOSIS — H6121 Impacted cerumen, right ear: Secondary | ICD-10-CM

## 2012-11-01 DIAGNOSIS — R590 Localized enlarged lymph nodes: Secondary | ICD-10-CM

## 2012-11-01 DIAGNOSIS — R221 Localized swelling, mass and lump, neck: Secondary | ICD-10-CM

## 2012-11-01 LAB — POCT CBC
Granulocyte percent: 78.9 %G (ref 37–80)
HCT, POC: 41.6 % (ref 37.7–47.9)
Hemoglobin: 12.8 g/dL (ref 12.2–16.2)
Lymph, poc: 1 (ref 0.6–3.4)
MCH, POC: 28 pg (ref 27–31.2)
MCHC: 30.8 g/dL — AB (ref 31.8–35.4)
MCV: 91 fL (ref 80–97)
MID (cbc): 0.3 (ref 0–0.9)
MPV: 10.5 fL (ref 0–99.8)
POC Granulocyte: 5.2 (ref 2–6.9)
POC LYMPH PERCENT: 15.9 %L (ref 10–50)
POC MID %: 5.2 %M (ref 0–12)
Platelet Count, POC: 223 10*3/uL (ref 142–424)
RBC: 4.57 M/uL (ref 4.04–5.48)
RDW, POC: 14.7 %
WBC: 6.6 10*3/uL (ref 4.6–10.2)

## 2012-11-01 LAB — POCT RAPID STREP A (OFFICE): Rapid Strep A Screen: NEGATIVE

## 2012-11-01 LAB — POCT SEDIMENTATION RATE: POCT SED RATE: 21 mm/hr (ref 0–22)

## 2012-11-01 MED ORDER — CEFTRIAXONE SODIUM 1 G IJ SOLR
1.0000 g | Freq: Once | INTRAMUSCULAR | Status: AC
  Administered 2012-11-01: 1 g via INTRAMUSCULAR

## 2012-11-01 MED ORDER — ACETAMINOPHEN 325 MG PO TABS
1000.0000 mg | ORAL_TABLET | Freq: Once | ORAL | Status: AC
  Administered 2012-11-01: 975 mg via ORAL

## 2012-11-01 NOTE — Patient Instructions (Addendum)
We gave you a dose of Rocephin (an antibiotic) tonight along with some allergy medications (zyrtec and zantac).  Take your benadryl 62m tonight like you normally do. If you develop trouble swallowing, opening your mouth, or continue having fevers, go to the ER.  If you are any worse AT ALL in the morning or if you still have a fever, you are NOT to go to work and come in and see me.  If you are better in the morning and still feeling ok, it is ok to go to work and come in to see me for a recheck after you get off.

## 2012-11-01 NOTE — Progress Notes (Signed)
Subjective:    Patient ID: [name], female    DOB: 28-Feb-1960, 52 y.o.   MRN: [id number] Chief Complaint  Patient presents with  . swollen lip, swollen glands, chills, fever started yesterday   HPI  Yesterday after lunch she started feel her lip swell which has progressively worsened. This afternoon she noticed her right neck glands were swollen. Her ears hurt, she feels achy, chilled, and very fatigued.  No sore throat or trouble opening her mouth. No nasal pain or swelling, no nasal congestion, no trouble swallowing, no cough or shortness of breath. No fevers, does not feel ill.  Has something similar 20 yrs ago treated with prednisone and antibiotic - never found out what it was.  Has had hives of unknown etiology in the past, none currently. Did have some new salsa and new pumpkin spice bread yesterday.  Took metformin, xarelto, pacerone, and synthroid this morning but nothing else.  Always takes diphenhydramine 68m qhs for sleep.   Works at [employer] from 8-4 and son drives her - does not have a car/transportation.  Past Medical History  Diagnosis Date  . HTN (hypertension)   . Hyperlipidemia   . GERD (gastroesophageal reflux disease)   . Depression   . Anxiety   . Persistent atrial fibrillation     Myoview 4/09: No ischemia;  echo 4/09: EF 60-65%;   Flecainide, beta blocker, Coumadin;    Unable to take Tikosyn 2/2 prolonged QT  . Obstructive sleep apnea   . Borderline diabetes   . Atrial flutter   . Chronic diastolic heart failure   . Pulmonary embolism     Right lower lobe diagnosed by CT 6/12  . COPD (chronic obstructive pulmonary disease)    Current Outpatient Prescriptions on File Prior to Visit  Medication Sig Dispense Refill  . amiodarone (PACERONE) 200 MG tablet Take 1 tablet (200 mg total) by mouth daily.  90 tablet  3  . diltiazem (CARDIZEM CD) 360 MG 24 hr capsule Take 1 capsule (360 mg total) by mouth daily.  90 capsule  3  .  diphenhydrAMINE (BENADRYL) 50 MG capsule Take 50 mg by mouth at bedtime as needed for itching.      . furosemide (LASIX) 40 MG tablet 1 tab daily and may take extra tab if weigh climbing  135 tablet  3  . potassium chloride SA (K-DUR,KLOR-CON) 20 MEQ tablet 1 tab daily and add additional tab if you take an extra tablet  135 tablet  3  . Rivaroxaban (XARELTO) 20 MG TABS Take 1 tablet (20 mg total) by mouth daily.  90 tablet  3  . pantoprazole (PROTONIX) 40 MG tablet Take 1 tablet (40 mg total) by mouth daily.  90 tablet  3  . tiotropium (SPIRIVA) 18 MCG inhalation capsule Place 18 mcg into inhaler and inhale daily.      . VENTOLIN HFA 108 (90 BASE) MCG/ACT inhaler        No current facility-administered medications on file prior to visit.   Allergies  Allergen Reactions  . Sulfonamide Derivatives Hives and Itching    Review of Systems  Constitutional: Positive for chills and fatigue. Negative for fever, diaphoresis, activity change and appetite change.  HENT: Positive for ear pain and facial swelling. Negative for nosebleeds, congestion, sore throat, rhinorrhea, sneezing, drooling, mouth sores, trouble swallowing, neck pain, neck stiffness, dental problem, voice change, postnasal drip, sinus pressure, tinnitus and ear discharge.   Eyes: Negative for pain and itching.  Respiratory: Negative for cough and shortness of breath.   Cardiovascular: Negative for chest pain.  Gastrointestinal: Negative for nausea, vomiting, abdominal pain, diarrhea and constipation.  Genitourinary: Negative for dysuria.  Musculoskeletal: Positive for myalgias and arthralgias.  Skin: Positive for color change.  Neurological: Positive for headaches. Negative for dizziness and syncope.  Hematological: Positive for adenopathy.      BP 142/88  Pulse 90  Temp(Src) 102 F (38.9 C) (Oral)  Resp 19  Ht _0  (1.499 m)  Wt 236 lb (107.049 kg)  BMI 47.64 kg/m2  SpO2 93% Objective:   Physical Exam  Constitutional:  She is oriented to person, place, and time. She appears well-developed and well-nourished. No distress.  HENT:  Head: Normocephalic and atraumatic.  Right Ear: Tympanic membrane, external ear and ear canal normal.  Left Ear: Tympanic membrane, external ear and ear canal normal.  Nose: Nose normal. No mucosal edema or rhinorrhea.  Mouth/Throat: Uvula is midline, oropharynx is clear and moist and mucous membranes are normal. She does not have dentures. No oral lesions. No trismus in the jaw. Normal dentition. No dental abscesses, edematous or dental caries. No oropharyngeal exudate, posterior oropharyngeal edema, posterior oropharyngeal erythema or tonsillar abscesses.    Cerumen obscuring right TM so removed by lavage after which TM was visualized and was nml. Small oropharynx. Gums nml, no jaw or gum tenderness Right upper lip with moderate swelling.  Eyes: Conjunctivae are normal. Right eye exhibits no discharge. Left eye exhibits no discharge. No scleral icterus.  Neck: Normal range of motion. Neck supple.  Cardiovascular: Normal rate, regular rhythm, normal heart sounds and intact distal pulses.   Pulmonary/Chest: Effort normal and breath sounds normal.  Lymphadenopathy:       Head (right side): Submandibular and tonsillar adenopathy present. No submental, no preauricular and no posterior auricular adenopathy present.       Head (left side): No submental, no submandibular, no tonsillar, no preauricular, no posterior auricular and no occipital adenopathy present.    She has cervical adenopathy.       Right cervical: Superficial cervical adenopathy present. No deep cervical and no posterior cervical adenopathy present.      Left cervical: No superficial cervical, no deep cervical and no posterior cervical adenopathy present.       Right: No supraclavicular adenopathy present.       Left: No supraclavicular adenopathy present.  Neurological: She is alert and oriented to person, place, and  time.  Skin: Skin is warm and dry. She is not diaphoretic. No erythema.  Psychiatric: She has a normal mood and affect. Her behavior is normal.      Results for orders placed in visit on 11/01/12  POCT CBC      Result Value Range   WBC 6.6  4.6 - 10.2 K/uL   Lymph, poc 1.0  0.6 - 3.4   POC LYMPH PERCENT 15.9  10 - 50 %L   MID (cbc) 0.3  0 - 0.9   POC MID % 5.2  0 - 12 %M   POC Granulocyte 5.2  2 - 6.9   Granulocyte percent 78.9  37 - 80 %G   RBC 4.57  4.04 - 5.48 M/uL   Hemoglobin 12.8  12.2 - 16.2 g/dL   HCT, POC 41.6  37.7 - 47.9 %   MCV 91.0  80 - 97 fL   MCH, POC 28.0  27 - 31.2 pg   MCHC 30.8 (*) 31.8 - 35.4 g/dL   RDW,  POC 14.7     Platelet Count, POC 223  142 - 424 K/uL   MPV 10.5  0 - 99.8 fL  POCT SEDIMENTATION RATE      Result Value Range   POCT SED RATE 21  0 - 22 mm/hr  POCT RAPID STREP A (OFFICE)      Result Value Range   Rapid Strep A Screen Negative  Negative   Assessment & Plan:  Fever, unspecified - Plan: POCT CBC, POCT SEDIMENTATION RATE, POCT rapid strep A, Culture, Group A Strep, cefTRIAXone (ROCEPHIN) injection 1 g, acetaminophen (TYLENOL) tablet 975 mg  Swollen upper lip - Plan: POCT CBC, POCT SEDIMENTATION RATE, cefTRIAXone (ROCEPHIN) injection 1 g - given zyrtec 20m po x 1 and ranitidine 300 mg po x 1 in office. Take diphenhydramine 511mtonight.  If still not sign of infection tomorrow, consider starting course of steroids.  Excess ear wax, right - Plan: Ear wax removal, Ear wax removal by lavage.  Lymphadenopathy of right cervical region - due to sig LAD and fever, I am concerned about deep space neck infection so covered with 1g of Rocephin and will hold off on steroids.  However, imaging delayed until after recheck tomorrow as pt is relatively asymptomatic.  Recheck tomorrow - go to ER if develop any sxs - consider CT scan of neck vs treatment with both steroids and antibiotics depending on sxs and exam tomorrow - see pt instructions.

## 2012-11-02 ENCOUNTER — Ambulatory Visit (INDEPENDENT_AMBULATORY_CARE_PROVIDER_SITE_OTHER): Payer: BC Managed Care – PPO | Admitting: Emergency Medicine

## 2012-11-02 VITALS — BP 110/82 | HR 70 | Temp 98.3°F | Resp 18

## 2012-11-02 DIAGNOSIS — K130 Diseases of lips: Secondary | ICD-10-CM

## 2012-11-02 MED ORDER — AMOXICILLIN-POT CLAVULANATE 875-125 MG PO TABS: 1.0000 | ORAL_TABLET | Freq: Two times a day (BID) | ORAL | Status: DC

## 2012-11-02 NOTE — Progress Notes (Signed)
[facility] [address] [place] [address] [phone]  Date:  11/02/2012   Name:  [name]   DOB:  10-Oct-1960   MRN:  [id number]  PCP:  Kelby Aline, Alfonso Patten, PA-C    Chief Complaint: Follow-up   History of Present Illness:  Robin CASAD is a 52 y.o. very pleasant female patient who presents with the following:  Seen yesterday with swollen lip and right adenopathy.  Given rocephin and her fever has resolved and she feels markedly better.  No malaise or myalgias, fever or chills.  No sore throat, cough, or other complaint.    Patient Active Problem List   Diagnosis Date Noted  . Atrial fibrillation 01/27/2012  . Atrial flutter with rapid ventricular response 01/14/2012  . Acute on chronic diastolic CHF (congestive heart failure), NYHA class 3 01/14/2012  . Local skin infection 09/26/2010  . COPD (chronic obstructive pulmonary disease) 08/05/2010  . Chronic diastolic heart failure   . Pulmonary embolism   . Atrial flutter   . HTN (hypertension)   . OBSTRUCTIVE SLEEP APNEA 07/21/2007  . DEPRESSION 07/20/2007  . G E R D 07/20/2007    Past Medical History  Diagnosis Date  . HTN (hypertension)   . Hyperlipidemia   . GERD (gastroesophageal reflux disease)   . Depression   . Anxiety   . Persistent atrial fibrillation     Myoview 4/09: No ischemia;  echo 4/09: EF 60-65%;   Flecainide, beta blocker, Coumadin;    Unable to take Tikosyn 2/2 prolonged QT  . Obstructive sleep apnea   . Borderline diabetes   . Atrial flutter   . Chronic diastolic heart failure   . Pulmonary embolism     Right lower lobe diagnosed by CT 6/12  . COPD (chronic obstructive pulmonary disease)     Past Surgical History  Procedure Laterality Date  . Tubal ligation  1996  . Tee without cardioversion  01/19/2012    Procedure: TRANSESOPHAGEAL ECHOCARDIOGRAM (TEE);  Surgeon: Jolaine Artist, MD;  Location: [facility] ENDOSCOPY;  Service: Cardiovascular;  Laterality: N/A;  .  Cardioversion  01/19/2012    Procedure: CARDIOVERSION;  Surgeon: Jolaine Artist, MD;  Location: [facility] ENDOSCOPY;  Service: Cardiovascular;  Laterality: N/A;  . Cardioversion  01/21/2012    Procedure: CARDIOVERSION;  Surgeon: Carlena Bjornstad, MD;  Location: [facility] ENDOSCOPY;  Service: Cardiovascular;  Laterality: N/A;  Rm [id number]    History  Substance Use Topics  . Smoking status: Former Smoker -- 2.00 packs/day for 35 years    Types: Cigarettes    Quit date: 09/08/2009  . Smokeless tobacco: Never Used  . Alcohol Use: No    Family History  Problem Relation Age of Onset  . Colon cancer Mother   . Coronary artery disease Mother   . Coronary artery disease Father   . Asthma Father   . Emphysema Father   . Allergies Father   . Heart attack Maternal Grandfather   . Heart disease Maternal Grandmother   . Depression Daughter     Allergies  Allergen Reactions  . Sulfonamide Derivatives Hives and Itching    Medication list has been reviewed and updated.  Current Outpatient Prescriptions on File Prior to Visit  Medication Sig Dispense Refill  . amiodarone (PACERONE) 200 MG tablet Take 1 tablet (200 mg total) by mouth daily.  90 tablet  3  . diltiazem (CARDIZEM CD) 360 MG 24 hr capsule Take 1 capsule (360 mg total) by  mouth daily.  90 capsule  3  . diphenhydrAMINE (BENADRYL) 50 MG capsule Take 50 mg by mouth at bedtime as needed for itching.      . furosemide (LASIX) 40 MG tablet 1 tab daily and may take extra tab if weigh climbing  135 tablet  3  . levothyroxine (SYNTHROID, LEVOTHROID) 50 MCG tablet Take 50 mcg by mouth daily before breakfast.      . metFORMIN (GLUCOPHAGE) 500 MG tablet Take 500 mg by mouth every morning.      . pantoprazole (PROTONIX) 40 MG tablet Take 1 tablet (40 mg total) by mouth daily.  90 tablet  3  . potassium chloride SA (K-DUR,KLOR-CON) 20 MEQ tablet 1 tab daily and add additional tab if you take an extra tablet  135 tablet  3  . Rivaroxaban (XARELTO) 20 MG TABS  Take 1 tablet (20 mg total) by mouth daily.  90 tablet  3  . rosuvastatin (CRESTOR) 5 MG tablet Take 5 mg by mouth daily.      Marland Kitchen tiotropium (SPIRIVA) 18 MCG inhalation capsule Place 18 mcg into inhaler and inhale daily.      . VENTOLIN HFA 108 (90 BASE) MCG/ACT inhaler        No current facility-administered medications on file prior to visit.    Review of Systems:  As per HPI, otherwise negative.    Physical Examination: Filed Vitals:   11/02/12 1652  BP: 110/82  Pulse: 70  Temp: 98.3 F (36.8 C)  Resp: 18   There were no vitals filed for this visit. There is no weight on file to calculate BMI. Ideal Body Weight:     GEN: WDWN, NAD, Non-toxic, Alert & Oriented x 3 HEENT: Atraumatic, Normocephalic.   Scab on right upper lip with swelling of the lip.  No purulent drainage.  Normal oropharynx.   Ears and Nose: No external deformity. EXTR: No clubbing/cyanosis/edema NEURO: Normal gait.  PSYCH: Normally interactive. Conversant. Not depressed or anxious appearing.  Calm demeanor.    Assessment and Plan: Cellulitis upper lip augmentin  Warm compress Follow up with Dr Brigitte Pulse   Signed,  Ellison Carwin, MD

## 2012-11-02 NOTE — Patient Instructions (Addendum)
Cellulitis Cellulitis is an infection of the skin and the tissue beneath it. The infected area is usually red and tender. Cellulitis occurs most often in the arms and lower legs.  CAUSES  Cellulitis is caused by bacteria that enter the skin through cracks or cuts in the skin. The most common types of bacteria that cause cellulitis are Staphylococcus and Streptococcus. SYMPTOMS   Redness and warmth.  Swelling.  Tenderness or pain.  Fever. DIAGNOSIS  Your caregiver can usually determine what is wrong based on a physical exam. Blood tests may also be done. TREATMENT  Treatment usually involves taking an antibiotic medicine. HOME CARE INSTRUCTIONS   Take your antibiotics as directed. Finish them even if you start to feel better.  Keep the infected arm or leg elevated to reduce swelling.  Apply a warm cloth to the affected area up to 4 times per day to relieve pain.  Only take over-the-counter or prescription medicines for pain, discomfort, or fever as directed by your caregiver.  Keep all follow-up appointments as directed by your caregiver. SEEK MEDICAL CARE IF:   You notice red streaks coming from the infected area.  Your red area gets larger or turns dark in color.  Your bone or joint underneath the infected area becomes painful after the skin has healed.  Your infection returns in the same area or another area.  You notice a swollen bump in the infected area.  You develop new symptoms. SEEK IMMEDIATE MEDICAL CARE IF:   You have a fever.  You feel very sleepy.  You develop vomiting or diarrhea.  You have a general ill feeling (malaise) with muscle aches and pains. MAKE SURE YOU:   Understand these instructions.  Will watch your condition.  Will get help right away if you are not doing well or get worse. Document Released: 11/04/2004 Document Revised: 07/27/2011 Document Reviewed: 04/12/2011 [facility] Patient Information 2014 [place].

## 2012-11-04 LAB — CULTURE, GROUP A STREP: Organism ID, Bacteria: NORMAL

## 2012-11-06 ENCOUNTER — Encounter (HOSPITAL_COMMUNITY): Payer: BC Managed Care – PPO

## 2012-11-22 ENCOUNTER — Encounter (HOSPITAL_COMMUNITY): Payer: Self-pay

## 2012-11-22 ENCOUNTER — Ambulatory Visit (HOSPITAL_COMMUNITY)
Admission: RE | Admit: 2012-11-22 | Discharge: 2012-11-22 | Disposition: A | Discharge status: Home / Self Care | Payer: BC Managed Care – PPO | Source: Ambulatory Visit | Attending: Internal Medicine | Admitting: Internal Medicine

## 2012-11-22 VITALS — BP 128/86 | HR 80 | Wt 228.1 lb

## 2012-11-22 DIAGNOSIS — I5032 Chronic diastolic (congestive) heart failure: Secondary | ICD-10-CM

## 2012-11-22 DIAGNOSIS — I4891 Unspecified atrial fibrillation: Secondary | ICD-10-CM

## 2012-11-22 LAB — BASIC METABOLIC PANEL
BUN: 17 mg/dL (ref 6–23)
CO2: 29 mEq/L (ref 19–32)
Calcium: 9.5 mg/dL (ref 8.4–10.5)
Chloride: 99 mEq/L (ref 96–112)
Creatinine, Ser: 1.15 mg/dL — ABNORMAL HIGH (ref 0.50–1.10)
GFR calc Af Amer: 62 mL/min — ABNORMAL LOW (ref >90)
GFR calc non Af Amer: 53 mL/min — ABNORMAL LOW (ref >90)
Glucose, Bld: 89 mg/dL (ref 70–99)
Potassium: 3.5 mEq/L (ref 3.5–5.1)
Sodium: 141 mEq/L (ref 135–145)

## 2012-11-22 LAB — PRO B NATRIURETIC PEPTIDE: Pro B Natriuretic peptide (BNP): 57.2 pg/mL (ref 0–125)

## 2012-11-22 NOTE — Progress Notes (Signed)
Patient ID: [name], female   DOB: 1960-05-28, 52 y.o.   MRN: [id number]  PCP:  Kelby Aline, PA-C (at Dr. Idell Pickles office) Primary Cardiologist:  Dr. [name] Pulmonologist: Dr Gwenette Greet EP: Dr Rayann Heman  History of Present Illness: [name] is a 52 y.o. female with a history of chronic diastolic heart failure, HTN, hyperlipidemia, sleep apnea, glucose intolerance, COPD and GERD as well as paroxysmal A fib.     She was admitted 6/2-6/11/2010.  Prior to admission, she had stopped all of her medications due to difficulties with finances.  She had been developing increasing dyspnea requiring several rounds of antibiotics and prednisone taper.  She presented to the hospital with narrow complex tachycardia treated with adenosine x2.  She developed normal sinus rhythm briefly and then converted to atrial flutter with rapid ventricular rate.  Myocardial infarction was ruled out.  She continued to have intermittent episodes of atrial flutter.  She was placed on flecainide.   She was seen by EP.  Given her multiple atrial arrhythmias, ablation therapy was not felt to be helpful and she was continued on flecainide.  She had continued dyspnea and CT scan demonstrated a right lower lobe nonocclusive pulmonary embolism.  She was placed on heparin and Coumadin.    Admitted 01/14/12 with afib with RVR (NSR 09/23/10) and volume overload.  During hospitalization Underwent TEE/DCCV and DCCV  which showed no thrombus.  She failed 2 cardioversions therefore rate control was pursued. Flecainide was stopped and transitioned to cardizem and amiodarone. She was also placed on Xarelto. Discharge weight 215 pounds.  She was back in a NSR 03/31/12   07/19/12 NSR 72 pbm  Follow up: Doing pretty well since last visit. Denies SOB/PND/Orthopnea or CP. No palpitations. + DOE with minimal exertion, no change from baseline. No BRPPR or melena. Takes lasix as needed, however has taken over the past couple days BID. She  does not weigh at home. Compliant with medications. Not exercising. Thinks she may drink less than 2L a day, however does not follow low salt diet.   07/31/12 TSH 6.06 T4 1.14  ALT 40   ROS: All pertinent positives and negatives as in HPI, otherwise negative.     Past Medical History  Diagnosis Date  . HTN (hypertension)   . Hyperlipidemia   . GERD (gastroesophageal reflux disease)   . Depression   . Anxiety   . Persistent atrial fibrillation     Myoview 4/09: No ischemia;  echo 4/09: EF 60-65%;   Flecainide, beta blocker, Coumadin;    Unable to take Tikosyn 2/2 prolonged QT  . Obstructive sleep apnea   . Borderline diabetes   . Atrial flutter   . Chronic diastolic heart failure   . Pulmonary embolism     Right lower lobe diagnosed by CT 6/12  . COPD (chronic obstructive pulmonary disease)     Current Outpatient Prescriptions  Medication Sig Dispense Refill  . amiodarone (PACERONE) 200 MG tablet Take 1 tablet (200 mg total) by mouth daily.  90 tablet  3  . diltiazem (CARDIZEM CD) 360 MG 24 hr capsule Take 1 capsule (360 mg total) by mouth daily.  90 capsule  3  . diphenhydrAMINE (BENADRYL) 50 MG capsule Take 50 mg by mouth at bedtime as needed for itching.      . furosemide (LASIX) 40 MG tablet 1 tab daily and may take extra tab if weigh climbing  135 tablet  3  . levothyroxine (SYNTHROID, LEVOTHROID) 100 MCG  tablet Take 100 mcg by mouth daily before breakfast.      . metFORMIN (GLUCOPHAGE) 500 MG tablet Take 500 mg by mouth every morning.      . pantoprazole (PROTONIX) 40 MG tablet Take 1 tablet (40 mg total) by mouth daily.  90 tablet  3  . potassium chloride SA (K-DUR,KLOR-CON) 20 MEQ tablet 1 tab daily and add additional tab if you take an extra tablet  135 tablet  3  . Rivaroxaban (XARELTO) 20 MG TABS Take 1 tablet (20 mg total) by mouth daily.  90 tablet  3  . rosuvastatin (CRESTOR) 5 MG tablet Take 5 mg by mouth daily.      Marland Kitchen tiotropium (SPIRIVA) 18 MCG inhalation capsule  Place 18 mcg into inhaler and inhale daily.      . VENTOLIN HFA 108 (90 BASE) MCG/ACT inhaler        No current facility-administered medications for this encounter.    Allergies: Allergies  Allergen Reactions  . Sulfonamide Derivatives Hives and Itching   Filed Vitals:   11/22/12 1521  BP: 128/86  Pulse: 80  Weight: 228 lb 1.9 oz (103.475 kg)  SpO2: 95%   PHYSICAL EXAM: Well nourished, well developed, in no acute distress HEENT: normal Neck: JVD hard to assess but appears slightly elevated Cardiac:  Regular, normal S1, S2; no murmur Lungs:  Clear. Decreased bs throughout. no wheezing, rhonchi or rales Abd: obese. soft, nontender, mild distention, no hepatomegaly Ext: tr edema.  Neuro:  CNs 2-12 intact, no focal abnormalities noted  ASSESSMENT AND PLAN:  1) Chronic diastolic HF, EF 21%, mod MR - NYHA II-III symptoms. Volume status slightly elevated have asked patient to please take 40 mg lasix daily.  - Reinforced the need and importance of daily weights, a low sodium diet, and fluid restriction (less than 2 L a day). Instructed to call the HF clinic if weight increases more than 3 lbs overnight or 5 lbs in a week.  - BMET and pro-BNP today - F/U 4 months 2) Obesity - Encouraged her to start exercising and try to cut back on her portion sizes.  3) Hyperlipidemia - Managed by PCP 4) Atrial Fibrillation - maintaining NSR. Continue amiodarone, diltiazem and xarelto. 5) COPD  F/U 4 months [name] B NP-C 3:44 PM  Patient seen and examined with Robin Bame, NP. We discussed all aspects of the encounter. I agree with the assessment and plan as stated above. She is doing surprisingly well since we last saw her. Volume status just minimally elevated. Maintaining NSR. Will have her take lasix daily. Reinforced need for daily weights and reviewed use of sliding scale diuretics. Continue amio and Xarelto.   Daniel Bensimhon,MD 3:00 PM

## 2012-11-22 NOTE — Patient Instructions (Addendum)
Start taking your lasix 40 mg daily along with your 20 meq of potassium  Start weighing daily  F/U 4 months  Do the following things EVERYDAY: 1) Weigh yourself in the morning before breakfast. Write it down and keep it in a log. 2) Take your medicines as prescribed 3) Eat low salt foods-Limit salt (sodium) to 2000 mg per day.  4) Stay as active as you can everyday 5) Limit all fluids for the day to less than 2 liters 6)

## 2012-11-23 ENCOUNTER — Telehealth (HOSPITAL_COMMUNITY): Payer: Self-pay | Admitting: Anesthesiology

## 2012-11-23 NOTE — Telephone Encounter (Signed)
Called patient about labs from 11/21/12.  K+ 3.5 take extra 20 meq today.  Cr stable 1.15

## 2013-03-03 ENCOUNTER — Other Ambulatory Visit: Payer: Self-pay | Admitting: Emergency Medicine

## 2013-03-05 NOTE — Telephone Encounter (Signed)
LMOM FOR PT TO CALL & SCHEDULE

## 2013-03-06 NOTE — Telephone Encounter (Signed)
03/06/13 = LMOM TO CALL TO SCHEDULE

## 2013-03-29 NOTE — Telephone Encounter (Signed)
LMOM TO CALL TO [facility]

## 2013-04-06 ENCOUNTER — Telehealth: Payer: Self-pay | Admitting: Emergency Medicine

## 2013-04-06 NOTE — Telephone Encounter (Signed)
patient needs to make appt before rx lmtc

## 2013-04-19 ENCOUNTER — Encounter (HOSPITAL_COMMUNITY): Payer: Self-pay | Admitting: Cardiology

## 2013-04-19 ENCOUNTER — Telehealth (HOSPITAL_COMMUNITY): Payer: Self-pay | Admitting: Cardiology

## 2013-04-19 NOTE — Telephone Encounter (Signed)
Attempting to contact pt to schedule 4 month follow up I have been unable to reach this patient by phone.  A letter is being sent to the last known home address.

## 2013-07-17 NOTE — Progress Notes (Signed)
Patient ID: [name], female   DOB: August 15, 1960, 53 y.o.   MRN: [id number]  PCP:  Kelby Aline, PA-C (at Dr. Idell Pickles office) Primary Cardiologist:  Dr. [name] Pulmonologist: Dr Gwenette Greet EP: Dr Rayann Heman  History of Present Illness: [name] is a 53 y.o. female with a history of chronic diastolic heart failure, HTN, hyperlipidemia, sleep apnea, glucose intolerance, COPD and GERD as well as paroxysmal A fib.     She was admitted 6/2-6/11/2010.  Prior to admission, she had stopped all of her medications due to difficulties with finances.  She had been developing increasing dyspnea requiring several rounds of antibiotics and prednisone taper.  She presented to the hospital with narrow complex tachycardia treated with adenosine x2.  She developed normal sinus rhythm briefly and then converted to atrial flutter with rapid ventricular rate.  Myocardial infarction was ruled out.  She continued to have intermittent episodes of atrial flutter.  She was placed on flecainide.   She was seen by EP.  Given her multiple atrial arrhythmias, ablation therapy was not felt to be helpful and she was continued on flecainide.  She had continued dyspnea and CT scan demonstrated a right lower lobe nonocclusive pulmonary embolism.  She was placed on heparin and Coumadin.    Admitted 01/14/12 with afib with RVR (NSR 09/23/10) and volume overload.  During hospitalization Underwent TEE/DCCV and DCCV  which showed no thrombus.  She failed 2 cardioversions therefore rate control was pursued. Flecainide was stopped and transitioned to cardizem and amiodarone. She was also placed on Xarelto. Discharge weight 215 pounds.  She was back in a NSR 03/31/12   She returns for follow up. Doing ok but worried about her grandson due to chronic illness. Mild dyspnea with exertion. + Orthopnea. Not taking synthroid or metformin for the last 6 months and in fact she says she skips medications. Not exercising or following a diet.  No bleeding problems.    Labs  07/31/12 TSH 6.06 T4 1.14  ALT 40 11/22/12 K 3.5 Creatinine 1.15  SH; Works full time at [employer]. Lives with her 2 sons. Separated from her husband    ROS: All pertinent positives and negatives as in HPI, otherwise negative.     Past Medical History  Diagnosis Date  . HTN (hypertension)   . Hyperlipidemia   . GERD (gastroesophageal reflux disease)   . Depression   . Anxiety   . Persistent atrial fibrillation     Myoview 4/09: No ischemia;  echo 4/09: EF 60-65%;   Flecainide, beta blocker, Coumadin;    Unable to take Tikosyn 2/2 prolonged QT  . Obstructive sleep apnea   . Borderline diabetes   . Atrial flutter   . Chronic diastolic heart failure   . Pulmonary embolism     Right lower lobe diagnosed by CT 6/12  . COPD (chronic obstructive pulmonary disease)     Current Outpatient Prescriptions  Medication Sig Dispense Refill  . amiodarone (PACERONE) 200 MG tablet Take 1 tablet (200 mg total) by mouth daily.  90 tablet  3  . diltiazem (CARDIZEM CD) 360 MG 24 hr capsule Take 1 capsule (360 mg total) by mouth daily.  90 capsule  3  . furosemide (LASIX) 40 MG tablet 1 tab daily and may take extra tab if weigh climbing  135 tablet  3  . potassium chloride SA (K-DUR,KLOR-CON) 20 MEQ tablet 1 tab daily and add additional tab if you take an extra tablet  135 tablet  3  . Rivaroxaban (XARELTO) 20 MG TABS Take 1 tablet (20 mg total) by mouth daily.  90 tablet  3  . VENTOLIN HFA 108 (90 BASE) MCG/ACT inhaler        No current facility-administered medications for this encounter.    Allergies: Allergies  Allergen Reactions  . Sulfonamide Derivatives Hives and Itching   Filed Vitals:   07/18/13 1439  BP: 141/83  Pulse: 83  Resp: 20  Weight: 240 lb (108.863 kg)  SpO2: 92%   PHYSICAL EXAM: Well nourished, well developed, in no acute distress HEENT: normal Neck: JVD hard to assess but appears elevated Cardiac:  Regular, normal S1, S2;  no murmur Lungs:  Clear. Decreased bs throughout. no wheezing, rhonchi or rales Abd: obese. soft, nontender, mild distention, no hepatomegaly Ext: tr edema.  Neuro:  CNs 2-12 intact, no focal abnormalities noted  ASSESSMENT AND PLAN:  1) Chronic diastolic HF, EF 59%, mod MR - NYHA II-III symptoms. Volume status elevated. Continue lasix 40 mg daily and she was instructed to take an extra 40 mg for the next 2 days.  CHECK BMET today   Reinforced the need and importance of daily weights, a low sodium diet, and fluid restriction (less than 2 L a day). Instructed to call the HF clinic if weight increases more than 3 lbs overnight or 5 lbs in a week.  2) Obesity - Encouraged her to start exercising and try to cut back on her portion sizes.  3) Hyperlipidemia - Managed by PCP 4) Atrial Fibrillation - maintaining NSR. Continue amiodarone, diltiazem and xarelto. Needs yearly exam. Check TSH today  5) DM- not taking medications for last 6 months 6) Hypothyroid- not take medications for last 6 months.   Today I stressed the importance of medication compliance and the need to follow up with PCP ASAP.   Follow up in 4 weeks.  Raeden Belzer NP-C 3:01 PM

## 2013-07-18 ENCOUNTER — Ambulatory Visit (HOSPITAL_COMMUNITY)
Admission: RE | Admit: 2013-07-18 | Discharge: 2013-07-18 | Disposition: A | Discharge status: Home / Self Care | Payer: BC Managed Care – PPO | Source: Ambulatory Visit | Attending: Internal Medicine | Admitting: Internal Medicine

## 2013-07-18 ENCOUNTER — Encounter (HOSPITAL_COMMUNITY): Payer: Self-pay

## 2013-07-18 VITALS — BP 141/83 | HR 83 | Resp 20 | Wt 240.0 lb

## 2013-07-18 DIAGNOSIS — E039 Hypothyroidism, unspecified: Secondary | ICD-10-CM | POA: Insufficient documentation

## 2013-07-18 DIAGNOSIS — R0601 Orthopnea: Secondary | ICD-10-CM | POA: Insufficient documentation

## 2013-07-18 DIAGNOSIS — Z7901 Long term (current) use of anticoagulants: Secondary | ICD-10-CM | POA: Insufficient documentation

## 2013-07-18 DIAGNOSIS — I4891 Unspecified atrial fibrillation: Secondary | ICD-10-CM | POA: Insufficient documentation

## 2013-07-18 DIAGNOSIS — E669 Obesity, unspecified: Secondary | ICD-10-CM | POA: Insufficient documentation

## 2013-07-18 DIAGNOSIS — J449 Chronic obstructive pulmonary disease, unspecified: Secondary | ICD-10-CM

## 2013-07-18 DIAGNOSIS — G4733 Obstructive sleep apnea (adult) (pediatric): Secondary | ICD-10-CM | POA: Insufficient documentation

## 2013-07-18 DIAGNOSIS — I5032 Chronic diastolic (congestive) heart failure: Secondary | ICD-10-CM | POA: Insufficient documentation

## 2013-07-18 DIAGNOSIS — E119 Type 2 diabetes mellitus without complications: Secondary | ICD-10-CM | POA: Insufficient documentation

## 2013-07-18 DIAGNOSIS — E785 Hyperlipidemia, unspecified: Secondary | ICD-10-CM | POA: Insufficient documentation

## 2013-07-18 DIAGNOSIS — J4489 Other specified chronic obstructive pulmonary disease: Secondary | ICD-10-CM | POA: Insufficient documentation

## 2013-07-18 DIAGNOSIS — R0609 Other forms of dyspnea: Secondary | ICD-10-CM | POA: Insufficient documentation

## 2013-07-18 DIAGNOSIS — R0989 Other specified symptoms and signs involving the circulatory and respiratory systems: Secondary | ICD-10-CM | POA: Insufficient documentation

## 2013-07-18 DIAGNOSIS — I10 Essential (primary) hypertension: Secondary | ICD-10-CM | POA: Insufficient documentation

## 2013-07-18 LAB — BASIC METABOLIC PANEL
BUN: 17 mg/dL (ref 6–23)
CO2: 31 mEq/L (ref 19–32)
Calcium: 9.1 mg/dL (ref 8.4–10.5)
Chloride: 98 mEq/L (ref 96–112)
Creatinine, Ser: 1.37 mg/dL — ABNORMAL HIGH (ref 0.50–1.10)
GFR calc Af Amer: 50 mL/min — ABNORMAL LOW (ref >90)
GFR calc non Af Amer: 43 mL/min — ABNORMAL LOW (ref >90)
Glucose, Bld: 117 mg/dL — ABNORMAL HIGH (ref 70–99)
Potassium: 4 mEq/L (ref 3.7–5.3)
Sodium: 143 mEq/L (ref 137–147)

## 2013-07-18 LAB — TSH: TSH: 4.75 u[IU]/mL — ABNORMAL HIGH (ref 0.350–4.500)

## 2013-07-18 MED ORDER — AMIODARONE HCL 200 MG PO TABS: 200.0000 mg | ORAL_TABLET | Freq: Every day | ORAL | Status: DC

## 2013-07-18 MED ORDER — DILTIAZEM HCL ER COATED BEADS 360 MG PO CP24: 360.0000 mg | ORAL_CAPSULE | Freq: Every day | ORAL | Status: DC

## 2013-07-18 MED ORDER — FUROSEMIDE 40 MG PO TABS: ORAL_TABLET | ORAL | Status: DC

## 2013-07-18 MED ORDER — RIVAROXABAN 20 MG PO TABS: 20.0000 mg | ORAL_TABLET | Freq: Every day | ORAL | Status: DC

## 2013-07-18 MED ORDER — POTASSIUM CHLORIDE CRYS ER 20 MEQ PO TBCR: EXTENDED_RELEASE_TABLET | ORAL | Status: DC

## 2013-07-18 NOTE — Patient Instructions (Signed)
Follow up in 1 month    Take lasix 40 mg twice a day for 2 days  Do the following things EVERYDAY: 1) Weigh yourself in the morning before breakfast. Write it down and keep it in a log. 2) Take your medicines as prescribed 3) Eat low salt foods-Limit salt (sodium) to 2000 mg per day.  4) Stay as active as you can everyday 5) Limit all fluids for the day to less than 2 liters

## 2013-07-19 ENCOUNTER — Other Ambulatory Visit (HOSPITAL_COMMUNITY): Payer: Self-pay | Admitting: *Deleted

## 2013-07-19 DIAGNOSIS — I4891 Unspecified atrial fibrillation: Secondary | ICD-10-CM

## 2013-07-19 MED ORDER — POTASSIUM CHLORIDE CRYS ER 20 MEQ PO TBCR: 20.0000 meq | EXTENDED_RELEASE_TABLET | Freq: Every day | ORAL | Status: DC

## 2013-08-16 ENCOUNTER — Encounter (HOSPITAL_COMMUNITY): Payer: Self-pay

## 2013-08-16 ENCOUNTER — Ambulatory Visit (HOSPITAL_COMMUNITY)
Admission: RE | Admit: 2013-08-16 | Discharge: 2013-08-16 | Disposition: A | Discharge status: Home / Self Care | Payer: BC Managed Care – PPO | Source: Ambulatory Visit | Attending: Adult Health | Admitting: Adult Health

## 2013-08-16 VITALS — BP 132/81 | HR 80 | Resp 18 | Wt 240.2 lb

## 2013-08-16 DIAGNOSIS — I5032 Chronic diastolic (congestive) heart failure: Secondary | ICD-10-CM | POA: Insufficient documentation

## 2013-08-16 DIAGNOSIS — E039 Hypothyroidism, unspecified: Secondary | ICD-10-CM | POA: Insufficient documentation

## 2013-08-16 DIAGNOSIS — E785 Hyperlipidemia, unspecified: Secondary | ICD-10-CM | POA: Insufficient documentation

## 2013-08-16 DIAGNOSIS — K219 Gastro-esophageal reflux disease without esophagitis: Secondary | ICD-10-CM | POA: Insufficient documentation

## 2013-08-16 DIAGNOSIS — I10 Essential (primary) hypertension: Secondary | ICD-10-CM | POA: Insufficient documentation

## 2013-08-16 DIAGNOSIS — E119 Type 2 diabetes mellitus without complications: Secondary | ICD-10-CM | POA: Insufficient documentation

## 2013-08-16 DIAGNOSIS — Z09 Encounter for follow-up examination after completed treatment for conditions other than malignant neoplasm: Secondary | ICD-10-CM | POA: Insufficient documentation

## 2013-08-16 DIAGNOSIS — E669 Obesity, unspecified: Secondary | ICD-10-CM | POA: Insufficient documentation

## 2013-08-16 DIAGNOSIS — Z79899 Other long term (current) drug therapy: Secondary | ICD-10-CM | POA: Insufficient documentation

## 2013-08-16 DIAGNOSIS — I480 Paroxysmal atrial fibrillation: Secondary | ICD-10-CM

## 2013-08-16 DIAGNOSIS — I4891 Unspecified atrial fibrillation: Secondary | ICD-10-CM

## 2013-08-16 NOTE — Progress Notes (Signed)
Patient ID: [name], female   DOB: 27-Mar-1960, 53 y.o.   MRN: [id number]  PCP:  Kelby Aline, PA-C (at Dr. Idell Pickles office) Primary Cardiologist:  Dr. [name] Pulmonologist: Dr Gwenette Greet EP: Dr Rayann Heman  History of Present Illness: [name] is a 53 y.o. female with a history of chronic diastolic heart failure, HTN, hyperlipidemia, sleep apnea, glucose intolerance, COPD and GERD as well as paroxysmal A fib.     She was admitted 6/2-6/11/2010.  Prior to admission, she had stopped all of her medications due to difficulties with finances.  She had been developing increasing dyspnea requiring several rounds of antibiotics and prednisone taper.  She presented to the hospital with narrow complex tachycardia treated with adenosine x2.  She developed normal sinus rhythm briefly and then converted to atrial flutter with rapid ventricular rate.  Myocardial infarction was ruled out.  She continued to have intermittent episodes of atrial flutter.  She was placed on flecainide.   She was seen by EP.  Given her multiple atrial arrhythmias, ablation therapy was not felt to be helpful and she was continued on flecainide.  She had continued dyspnea and CT scan demonstrated a right lower lobe nonocclusive pulmonary embolism.  She was placed on heparin and Coumadin.    Admitted 01/14/12 with afib with RVR (NSR 09/23/10) and volume overload.  During hospitalization Underwent TEE/DCCV and DCCV  which showed no thrombus.  She failed 2 cardioversions therefore rate control was pursued. Flecainide was stopped and transitioned to cardizem and amiodarone. She was also placed on Xarelto. Discharge weight 215 pounds.  She was back in a NSR 03/31/12    She returns for follow up. Last visit she was instructed to take an 40 mg of lasix for 2 days. OVerall she is feeling better. She does not weigh. She is taking medications again.  Mild dyspnea with ambulation. Not exercising. No bleeding problems.  Tries to limit  portions.    Labs  07/31/12 TSH 6.06 T4 1.14  ALT 40 11/22/12 K 3.5 Creatinine 1.15  SH; Works full time at [employer]. Lives with her 2 sons. Separated from her husband    ROS: All pertinent positives and negatives as in HPI, otherwise negative.     Past Medical History  Diagnosis Date  . HTN (hypertension)   . Hyperlipidemia   . GERD (gastroesophageal reflux disease)   . Depression   . Anxiety   . Persistent atrial fibrillation     Myoview 4/09: No ischemia;  echo 4/09: EF 60-65%;   Flecainide, beta blocker, Coumadin;    Unable to take Tikosyn 2/2 prolonged QT  . Obstructive sleep apnea   . Borderline diabetes   . Atrial flutter   . Chronic diastolic heart failure   . Pulmonary embolism     Right lower lobe diagnosed by CT 6/12  . COPD (chronic obstructive pulmonary disease)     Current Outpatient Prescriptions  Medication Sig Dispense Refill  . amiodarone (PACERONE) 200 MG tablet Take 1 tablet (200 mg total) by mouth daily.  30 tablet  3  . diltiazem (CARDIZEM CD) 360 MG 24 hr capsule Take 1 capsule (360 mg total) by mouth daily.  30 capsule  3  . furosemide (LASIX) 40 MG tablet 1 tab daily and may take extra tab if weigh climbing  60 tablet  3  . potassium chloride SA (K-DUR,KLOR-CON) 20 MEQ tablet Take 1 tablet (20 mEq total) by mouth daily. Take extra tab when you take extra  Furosemide  45 tablet  3  . rivaroxaban (XARELTO) 20 MG TABS tablet Take 1 tablet (20 mg total) by mouth daily.  30 tablet  3  . VENTOLIN HFA 108 (90 BASE) MCG/ACT inhaler        No current facility-administered medications for this encounter.    Allergies: Allergies  Allergen Reactions  . Sulfonamide Derivatives Hives and Itching   Filed Vitals:   08/16/13 1415  BP: 132/81  Pulse: 80  Resp: 18  Weight: 240 lb 4 oz (108.977 kg)  SpO2: 91%   PHYSICAL EXAM: Well nourished, well developed, in no acute distress HEENT: normal Neck: JVD hard to assess but does not appear  elevated Cardiac:  Regular, normal S1, S2; no murmur Lungs:  Clear. Decreased bs throughout. no wheezing, rhonchi or rales Abd: obese. soft, nontender, mild distention, no hepatomegaly Ext: tr edema.  Neuro:  CNs 2-12 intact, no focal abnormalities noted  ASSESSMENT AND PLAN:  1) Chronic diastolic HF, EF 07%, mod MR - NYHA II-III symptoms. Volume status ok. Continue lasix 40 mg daily .  I have strongly suggested to weigh and record daily however she says she will not weigh.   Reinforced the need and importance of daily weights, a low sodium diet, and fluid restriction (less than 2 L a day). Instructed to call the HF clinic if weight increases more than 3 lbs overnight or 5 lbs in a week.  2) Obesity - Encouraged her to start exercising and try to cut back on her portion sizes.  3) Hyperlipidemia - Managed by PCP 4) Atrial Fibrillation - maintaining NSR. Continue amiodarone, diltiazem and xarelto. Needs yearly exam. Check TSH today  5) DM- Taking metoformin again. Encouraged to follow up with PCP. ng medications for last 6 months 6) Hypothyroid- not take medications for last 6 months.    Follow up in 4 months  CLEGG,AMY NP-C 2:19 PM

## 2013-08-26 ENCOUNTER — Other Ambulatory Visit: Payer: Self-pay | Admitting: Emergency Medicine

## 2013-09-05 DIAGNOSIS — I10 Essential (primary) hypertension: Secondary | ICD-10-CM | POA: Insufficient documentation

## 2013-09-05 DIAGNOSIS — E785 Hyperlipidemia, unspecified: Secondary | ICD-10-CM | POA: Insufficient documentation

## 2013-09-05 DIAGNOSIS — D649 Anemia, unspecified: Secondary | ICD-10-CM | POA: Insufficient documentation

## 2013-09-05 DIAGNOSIS — E1169 Type 2 diabetes mellitus with other specified complication: Secondary | ICD-10-CM | POA: Insufficient documentation

## 2013-09-05 DIAGNOSIS — E119 Type 2 diabetes mellitus without complications: Secondary | ICD-10-CM | POA: Insufficient documentation

## 2013-09-05 DIAGNOSIS — E1165 Type 2 diabetes mellitus with hyperglycemia: Secondary | ICD-10-CM | POA: Insufficient documentation

## 2013-09-05 DIAGNOSIS — F419 Anxiety disorder, unspecified: Secondary | ICD-10-CM | POA: Insufficient documentation

## 2013-09-05 DIAGNOSIS — E1129 Type 2 diabetes mellitus with other diabetic kidney complication: Secondary | ICD-10-CM | POA: Insufficient documentation

## 2013-09-06 ENCOUNTER — Ambulatory Visit (INDEPENDENT_AMBULATORY_CARE_PROVIDER_SITE_OTHER): Payer: BC Managed Care – PPO | Admitting: Physician Assistant

## 2013-09-06 ENCOUNTER — Encounter: Payer: Self-pay | Admitting: Physician Assistant

## 2013-09-06 VITALS — BP 132/88 | HR 100 | Temp 98.4°F | Resp 18 | Ht 59.5 in | Wt 241.0 lb

## 2013-09-06 DIAGNOSIS — E039 Hypothyroidism, unspecified: Secondary | ICD-10-CM

## 2013-09-06 DIAGNOSIS — I4891 Unspecified atrial fibrillation: Secondary | ICD-10-CM

## 2013-09-06 DIAGNOSIS — E559 Vitamin D deficiency, unspecified: Secondary | ICD-10-CM

## 2013-09-06 DIAGNOSIS — I480 Paroxysmal atrial fibrillation: Secondary | ICD-10-CM

## 2013-09-06 DIAGNOSIS — R5383 Other fatigue: Secondary | ICD-10-CM

## 2013-09-06 DIAGNOSIS — I10 Essential (primary) hypertension: Secondary | ICD-10-CM

## 2013-09-06 DIAGNOSIS — E119 Type 2 diabetes mellitus without complications: Secondary | ICD-10-CM

## 2013-09-06 DIAGNOSIS — R5381 Other malaise: Secondary | ICD-10-CM

## 2013-09-06 DIAGNOSIS — Z79899 Other long term (current) drug therapy: Secondary | ICD-10-CM

## 2013-09-06 DIAGNOSIS — E785 Hyperlipidemia, unspecified: Secondary | ICD-10-CM

## 2013-09-06 DIAGNOSIS — N3000 Acute cystitis without hematuria: Secondary | ICD-10-CM

## 2013-09-06 MED ORDER — SERTRALINE HCL 100 MG PO TABS: 100.0000 mg | ORAL_TABLET | Freq: Every day | ORAL | Status: DC

## 2013-09-06 MED ORDER — LEVOTHYROXINE SODIUM 100 MCG PO TABS: ORAL_TABLET | ORAL | Status: DC

## 2013-09-06 NOTE — Progress Notes (Signed)
Assessment and Plan:  Hypertension: Continue medication, monitor blood pressure at home. Continue DASH diet. Cholesterol: Continue diet and exercise. Check cholesterol.  Diabetes-Continue diet and exercise. Check A1C Vitamin D Def- check level and continue medications.  Fatigue- ? From not wearing CPAP, from deconditioning, rule out TSH, infection, depression- if negative we will try zoloft.  Depression- start on zoloft 75m Hypothyroidism-check TSH level, continue medications the same.    Continue diet and meds as discussed. Further disposition pending results of labs. Discussed med's effects and SE's. OVER 40 minutes of exam, counseling, chart review, referral performed   HPI 53y.o. female  presents for 3 month follow up with hypertension, hyperlipidemia, diabetes and vitamin D. Her blood pressure has been controlled at home, today their BP is BP: 132/88 mmHg She sees Dr. ARayann Hemanfor Afib and is on amidoarone and xarelto.  She does not workout. She denies chest pain, shortness of breath, dizziness.  She is not on cholesterol medication and denies myalgias. Her cholesterol is not at goal. The cholesterol last visit was:  LDL 100  Last A1C in the office was:  Lab Results  Component Value Date   HGBA1C 6.1* 01/14/2012   Patient is on Vitamin D supplement. She states her husband left 5 years ago, her daughter's baby son is at the hospitial with rare congential defect with "bleeding"  She is not wearing her CPAP and states she has been having a lot of fatigue recently. She states she cried a lot, under a lot of stress. She has been on zoloft in the past.   Current Medications:  Current Outpatient Prescriptions on File Prior to Visit  Medication Sig Dispense Refill  . amiodarone (PACERONE) 200 MG tablet Take 1 tablet (200 mg total) by mouth daily.  30 tablet  3  . diltiazem (CARDIZEM CD) 360 MG 24 hr capsule Take 1 capsule (360 mg total) by mouth daily.  30 capsule  3  . furosemide  (LASIX) 40 MG tablet 1 tab daily and may take extra tab if weigh climbing  60 tablet  3  . levothyroxine (SYNTHROID, LEVOTHROID) 100 MCG tablet Take 100 mcg by mouth daily before breakfast.      . potassium chloride SA (K-DUR,KLOR-CON) 20 MEQ tablet Take 1 tablet (20 mEq total) by mouth daily. Take extra tab when you take extra Furosemide  45 tablet  3  . rivaroxaban (XARELTO) 20 MG TABS tablet Take 1 tablet (20 mg total) by mouth daily.  30 tablet  3  . VENTOLIN HFA 108 (90 BASE) MCG/ACT inhaler Inhale into the lungs every 4 (four) hours as needed.        No current facility-administered medications on file prior to visit.   Medical History:  Past Medical History  Diagnosis Date  . GERD (gastroesophageal reflux disease)   . Depression   . Persistent atrial fibrillation     Myoview 4/09: No ischemia;  echo 4/09: EF 60-65%;   Flecainide, beta blocker, Coumadin;    Unable to take Tikosyn 2/2 prolonged QT  . Obstructive sleep apnea   . Borderline diabetes   . Atrial flutter   . Chronic diastolic heart failure   . Pulmonary embolism     Right lower lobe diagnosed by CT 6/12  . COPD (chronic obstructive pulmonary disease)   . Hyperlipidemia   . HTN (hypertension)   . Type II or unspecified type diabetes mellitus without mention of complication, not stated as uncontrolled   . Anxiety   .  Anemia    Allergies:  Allergies  Allergen Reactions  . Sulfonamide Derivatives Hives and Itching  . Tikosyn [Dofetilide]     Prolonged qt     Review of Systems: _0  = complains of  _1  = denies  General: Fatigue _2  Fever _3  Chills _4  Weakness _5   Insomnia _6  Eyes: Redness _7  Blurred vision _8  Diplopia _9   ENT: Congestion _10  Sinus Pain _11  Post Nasal Drip _12  Sore Throat _13  Earache _14   Cardiac: Chest pain/pressure _15  SOB _16  Orthopnea _17   Palpitations _18   Paroxysmal nocturnal dyspnea_19  Claudication _20  Edema _21   Pulmonary: Cough _22  Wheezing_23   SOB _24   Snoring _25   GI: Nausea _26   Vomiting_27  Dysphagia_28  Heartburn_29  Abdominal pain _30  Constipation _31 ; Diarrhea _32 ; BRBPR _33  Melena_34  GU: Hematuria_35  Dysuria _36  Nocturia_37  Urgency _38   Hesitancy _39  Discharge _40  Neuro: Headaches_41  Vertigo_42  Paresthesias_43  Spasm _44  Speech changes _45  Incoordination _46   Ortho: Arthritis _47  Joint pain _48  Muscle pain _49  Joint swelling _50  Back Pain _51  Skin:  Rash _52   Pruritis _53  Change in skin lesion _54   Psych: Depression_55  Anxiety_56  Confusion _57  Memory loss _58   Heme/Lypmh: Bleeding _59  Bruising _60  Enlarged lymph nodes _61   Endocrine: Visual blurring _62  Paresthesia _63  Polyuria _64  Polydypsea _65    Heat/cold intolerance _66  Hypoglycemia _67   Family history- Review and unchanged Social history- Review and unchanged Physical Exam: BP 132/88  Pulse 100  Temp(Src) 98.4 F (36.9 C) (Temporal)  Resp 18  Ht 4' 11.5" (1.511 m)  Wt 241 lb (109.317 kg)  BMI 47.88 kg/m2 Wt Readings from Last 3 Encounters:  09/06/13 241 lb (109.317 kg)  08/16/13 240 lb 4 oz (108.977 kg)  07/18/13 240 lb (108.863 kg)   General Appearance: Well nourished, obese, in no apparent distress. Eyes: PERRLA, EOMs, conjunctiva no swelling or erythema Sinuses: No Frontal/maxillary tenderness ENT/Mouth: Ext aud canals clear, TMs without erythema, bulging. No erythema, swelling, or exudate on post pharynx.  Tonsils not swollen or erythematous. Hearing normal.  Neck: Supple, thyroid normal.  Respiratory: Respiratory effort normal, BS equal bilaterally without rales, rhonchi, wheezing or stridor.  Cardio: Irreg, Irreg with no MRGs. Brisk peripheral pulses with 1+ edema.  Abdomen: Soft, + BS.  Non tender, no guarding, rebound, hernias, masses. Lymphatics: Non tender without lymphadenopathy.  Musculoskeletal: Full ROM, 5/5 strength, normal gait.  Skin: Warm, dry without rashes, lesions, ecchymosis.  Neuro: Cranial nerves intact. No cerebellar symptoms. Sensation intact.  Psych: Awake and oriented X  3, normal affect, Insight and Judgment appropriate.    Vicie Mutters 3:56 PM

## 2013-09-06 NOTE — Patient Instructions (Addendum)
Need to get back on CPAP, call us if you need [place] to send in a prescription  Use a dropper to put olive oil or canola oil in the effected ear- 2-3 times a week. Let it soak for 20-30 min then you can take a shower or use a baby bulb with warm water to wash out the ear wax.  Do not use Qtips  Increase fiber   We are starting you on Metformin to prevent or treat diabetes. Metformin does not cause low blood sugars. In order to create energy your cells need insulin and sugar but sometime your cells do not accept the insulin and this can cause increased sugars and decreased energy. The Metformin helps your cells accept insulin and the sugar to give you more energy.   The two most common side effects are nausea and diarrhea, follow these rules to avoid it! You can take imodium per box instructions when starting metformin if needed.   Rules of metformin: 1) start out slow with only one pill daily. Our goal for you is 4 pills a day or 2069m total.  2) take with your largest meal. 3) Take with least amount of carbs.   Call if you have any problems.     Bad carbs also include fruit juice, alcohol, and sweet tea. These are empty calories that do not signal to your brain that you are full.   Please remember the good carbs are still carbs which convert into sugar. So please measure them out no more than 1/2-1 cup of rice, oatmeal, pasta, and beans.  Veggies are however free foods! Pile them on.   I like lean protein at every meal such as chicken, t[place] pork chops, cottage cheese, etc. Just do not fry these meats and please center your meal around vegetable, the meats should be a side dish.   No all fruit is created equal. Please see the list below, the fruit at the bottom is higher in sugars than the fruit at the top

## 2013-09-07 LAB — CBC WITH DIFFERENTIAL/PLATELET
Basophils Absolute: 0 10*3/uL (ref 0.0–0.1)
Basophils Relative: 0 % (ref 0–1)
Eosinophils Absolute: 0.1 10*3/uL (ref 0.0–0.7)
Eosinophils Relative: 1 % (ref 0–5)
HCT: 45.5 % (ref 36.0–46.0)
Hemoglobin: 14.9 g/dL (ref 12.0–15.0)
Lymphocytes Relative: 25 % (ref 12–46)
Lymphs Abs: 2 10*3/uL (ref 0.7–4.0)
MCH: 26.9 pg (ref 26.0–34.0)
MCHC: 32.7 g/dL (ref 30.0–36.0)
MCV: 82.1 fL (ref 78.0–100.0)
Monocytes Absolute: 0.5 10*3/uL (ref 0.1–1.0)
Monocytes Relative: 6 % (ref 3–12)
Neutro Abs: 5.4 10*3/uL (ref 1.7–7.7)
Neutrophils Relative %: 68 % (ref 43–77)
Platelets: 292 10*3/uL (ref 150–400)
RBC: 5.54 MIL/uL — ABNORMAL HIGH (ref 3.87–5.11)
RDW: 15.5 % (ref 11.5–15.5)
WBC: 7.9 10*3/uL (ref 4.0–10.5)

## 2013-09-07 LAB — HEMOGLOBIN A1C
Hgb A1c MFr Bld: 6.3 % — ABNORMAL HIGH (ref <5.7)
Mean Plasma Glucose: 134 mg/dL — ABNORMAL HIGH (ref <117)

## 2013-09-07 LAB — URINALYSIS, ROUTINE W REFLEX MICROSCOPIC
Bilirubin Urine: NEGATIVE
Glucose, UA: NEGATIVE mg/dL
Hgb urine dipstick: NEGATIVE
Ketones, ur: NEGATIVE mg/dL
Leukocytes, UA: NEGATIVE
Nitrite: NEGATIVE
Protein, ur: NEGATIVE mg/dL
Specific Gravity, Urine: 1.025 (ref 1.005–1.030)
Urobilinogen, UA: 0.2 mg/dL (ref 0.0–1.0)
pH: 5.5 (ref 5.0–8.0)

## 2013-09-07 LAB — LIPID PANEL
Cholesterol: 193 mg/dL (ref 0–200)
HDL: 60 mg/dL (ref >39)
LDL Cholesterol: 102 mg/dL — ABNORMAL HIGH (ref 0–99)
Total CHOL/HDL Ratio: 3.2 Ratio
Triglycerides: 153 mg/dL — ABNORMAL HIGH (ref <150)
VLDL: 31 mg/dL (ref 0–40)

## 2013-09-07 LAB — BASIC METABOLIC PANEL WITH GFR
BUN: 20 mg/dL (ref 6–23)
CO2: 34 mEq/L — ABNORMAL HIGH (ref 19–32)
Calcium: 9.3 mg/dL (ref 8.4–10.5)
Chloride: 101 mEq/L (ref 96–112)
Creat: 1.38 mg/dL — ABNORMAL HIGH (ref 0.50–1.10)
GFR, Est African American: 50 mL/min — ABNORMAL LOW
GFR, Est Non African American: 43 mL/min — ABNORMAL LOW
Glucose, Bld: 86 mg/dL (ref 70–99)
Potassium: 3.9 mEq/L (ref 3.5–5.3)
Sodium: 144 mEq/L (ref 135–145)

## 2013-09-07 LAB — FERRITIN: Ferritin: 17 ng/mL (ref 10–291)

## 2013-09-07 LAB — HEPATIC FUNCTION PANEL
ALT: 29 U/L (ref 0–35)
AST: 16 U/L (ref 0–37)
Albumin: 4 g/dL (ref 3.5–5.2)
Alkaline Phosphatase: 109 U/L (ref 39–117)
Bilirubin, Direct: 0.1 mg/dL (ref 0.0–0.3)
Indirect Bilirubin: 0.2 mg/dL (ref 0.2–1.2)
Total Bilirubin: 0.3 mg/dL (ref 0.2–1.2)
Total Protein: 7 g/dL (ref 6.0–8.3)

## 2013-09-07 LAB — VITAMIN B12: Vitamin B-12: 305 pg/mL (ref 211–911)

## 2013-09-07 LAB — VITAMIN D 25 HYDROXY (VIT D DEFICIENCY, FRACTURES): Vit D, 25-Hydroxy: 27 ng/mL — ABNORMAL LOW (ref 30–89)

## 2013-09-07 LAB — IRON AND TIBC
%SAT: 5 % — ABNORMAL LOW (ref 20–55)
Iron: 25 ug/dL — ABNORMAL LOW (ref 42–145)
TIBC: 473 ug/dL — ABNORMAL HIGH (ref 250–470)
UIBC: 448 ug/dL — ABNORMAL HIGH (ref 125–400)

## 2013-09-07 LAB — INSULIN, FASTING: Insulin fasting, serum: 40 u[IU]/mL — ABNORMAL HIGH (ref 3–28)

## 2013-09-07 LAB — MAGNESIUM: Magnesium: 2 mg/dL (ref 1.5–2.5)

## 2013-09-07 LAB — TSH: TSH: 3.798 u[IU]/mL (ref 0.350–4.500)

## 2013-09-08 LAB — URINE CULTURE: Colony Count: 35000

## 2013-09-13 ENCOUNTER — Other Ambulatory Visit: Payer: Self-pay

## 2013-09-13 MED ORDER — SERTRALINE HCL 100 MG PO TABS: 100.0000 mg | ORAL_TABLET | Freq: Every day | ORAL | Status: DC

## 2013-09-13 NOTE — Telephone Encounter (Signed)
Spoke with patient regarding lab results and instructions, patient advises that the pharmacy never got zoloft RX resent today, patient is to take 1/2 tab 100 mg pill until she sees Elroy at next office visit

## 2013-10-07 ENCOUNTER — Other Ambulatory Visit: Payer: Self-pay | Admitting: Emergency Medicine

## 2013-10-07 ENCOUNTER — Other Ambulatory Visit: Payer: Self-pay | Admitting: Internal Medicine

## 2013-10-07 MED ORDER — METFORMIN HCL ER 500 MG PO TB24: ORAL_TABLET | ORAL | Status: DC

## 2013-10-08 ENCOUNTER — Ambulatory Visit: Payer: Self-pay | Admitting: Emergency Medicine

## 2013-10-22 ENCOUNTER — Ambulatory Visit (INDEPENDENT_AMBULATORY_CARE_PROVIDER_SITE_OTHER): Payer: BC Managed Care – PPO | Admitting: Internal Medicine

## 2013-10-22 ENCOUNTER — Other Ambulatory Visit: Payer: Self-pay | Admitting: Internal Medicine

## 2013-10-22 ENCOUNTER — Encounter: Payer: Self-pay | Admitting: Internal Medicine

## 2013-10-22 VITALS — BP 132/84 | HR 88 | Temp 98.1°F | Resp 16 | Ht 59.5 in | Wt 249.4 lb

## 2013-10-22 DIAGNOSIS — E559 Vitamin D deficiency, unspecified: Secondary | ICD-10-CM | POA: Insufficient documentation

## 2013-10-22 DIAGNOSIS — J449 Chronic obstructive pulmonary disease, unspecified: Secondary | ICD-10-CM

## 2013-10-22 DIAGNOSIS — I10 Essential (primary) hypertension: Secondary | ICD-10-CM

## 2013-10-22 DIAGNOSIS — N3000 Acute cystitis without hematuria: Secondary | ICD-10-CM

## 2013-10-22 NOTE — Progress Notes (Signed)
Patient ID: [name], female   DOB: 1961-01-05, 53 y.o.   MRN: [id number]   This very nice 53 y.o.female presents for 3 month follow up with Hypertension, Hypothyroidism,  Hyperlipidemia, Pre-Diabetes and Vitamin D Deficiency. Patient had recent low col ct Diptheroid U/C not treated. Denies UT Sx's.   Patient is treated for HTN & BP has been controlled at home. Today's BP: 132/84 mmHg. Patient denies any cardiac type chest pain, palpitations, dyspnea/orthopnea/PND, dizziness, claudication, or dependent edema.   Hyperlipidemia is controlled with diet & meds. Patient denies myalgias or other med SE's. Last Lipids were Total Chol 193; HDL Chol 60; LDL  102*; Trig 153* on 09/06/2013.   Also, the patient has Morbid Obesity (BMI 49.6) and consequent  PreDiabetes and patient denies any symptoms of reactive hypoglycemia, diabetic polys, paresthesias or visual blurring.  Last A1c was  6.3% with elevated insulin of 40 on 09/06/2013 consistent with insulin resistance.   Recent B12 level was low at 305 on 09/06/2013. Iron studies were low, but Hgb was normal at 14.9 gm%. Further, Patient has history of Vitamin D Deficiency and patient supplements vitamin D without any suspected side-effects. Last vitamin D was  27 on 09/06/2013 and patient was advised to take 5,000 u which she hasn't started yet.     Medication List   amiodarone 200 MG tablet  Commonly known as:  PACERONE  Take 1 tablet (200 mg total) by mouth daily.     diltiazem 360 MG 24 hr capsule  Commonly known as:  CARDIZEM CD  Take 1 capsule (360 mg total) by mouth daily.     furosemide 40 MG tablet  Commonly known as:  LASIX  1 tab daily and may take extra tab if weigh climbing     levothyroxine 100 MCG tablet  Commonly known as:  SYNTHROID, LEVOTHROID  1 pill daily 30-60 mins before food     metFORMIN 500 MG 24 hr tablet  Commonly known as:  GLUCOPHAGE XR  Take 1 or 2 tablets once or twice daily as directed for Diabetes     OVER THE  COUNTER MEDICATION  as needed. Equate Night time sleep aid     potassium chloride SA 20 MEQ tablet  Commonly known as:  K-DUR,KLOR-CON  Take 1 tablet (20 mEq total) by mouth daily. Take extra tab when you take extra Furosemide     rivaroxaban 20 MG Tabs tablet  Commonly known as:  XARELTO  Take 1 tablet (20 mg total) by mouth daily.     sertraline 100 MG tablet  Commonly known as:  ZOLOFT  Take 1 tablet (100 mg total) by mouth daily.     VENTOLIN HFA 108 (90 BASE) MCG/ACT inhaler  Generic drug:  albuterol  Inhale into the lungs every 4 (four) hours as needed.     Allergies  Allergen Reactions  . Sulfonamide Derivatives Hives and Itching  . Tikosyn [Dofetilide]     Prolonged qt   PMHx:   Past Medical History  Diagnosis Date  . GERD (gastroesophageal reflux disease)   . Depression   . Persistent atrial fibrillation     Myoview 4/09: No ischemia;  echo 4/09: EF 60-65%;   Flecainide, beta blocker, Coumadin;    Unable to take Tikosyn 2/2 prolonged QT  . Obstructive sleep apnea   . Borderline diabetes   . Atrial flutter   . Chronic diastolic heart failure   . Pulmonary embolism     Right lower lobe diagnosed by  CT 6/12  . COPD (chronic obstructive pulmonary disease)   . Hyperlipidemia   . HTN (hypertension)   . Type II or unspecified type diabetes mellitus without mention of complication, not stated as uncontrolled   . Anxiety   . Anemia    FHx:    Reviewed / unchanged SHx:    Reviewed / unchanged  Systems Review:  Constitutional: Denies fever, chills, wt changes, headaches, insomnia, fatigue, night sweats, change in appetite. Eyes: Denies redness, blurred vision, diplopia, discharge, itchy, watery eyes.  ENT: Denies discharge, congestion, post nasal drip, epistaxis, sore throat, earache, hearing loss, dental pain, tinnitus, vertigo, sinus pain, snoring.  CV: Denies chest pain, palpitations, irregular heartbeat, syncope, dyspnea, diaphoresis, orthopnea, PND,  claudication or edema. Respiratory: denies cough, dyspnea, DOE, pleurisy, hoarseness, laryngitis, wheezing.  Gastrointestinal: Denies dysphagia, odynophagia, heartburn, reflux, water brash, abdominal pain or cramps, nausea, vomiting, bloating, diarrhea, constipation, hematemesis, melena, hematochezia  or hemorrhoids. Genitourinary: Denies dysuria, frequency, urgency, nocturia, hesitancy, discharge, hematuria or flank pain. Musculoskeletal: Denies arthralgias, myalgias, stiffness, jt. swelling, pain, limping or strain/sprain.  Skin: Denies pruritus, rash, hives, warts, acne, eczema or change in skin lesion(s). Neuro: No weakness, tremor, incoordination, spasms, paresthesia or pain. Psychiatric: Denies confusion, memory loss or sensory loss. Endo: Denies change in weight, skin or hair change.  Heme/Lymph: No excessive bleeding, bruising or enlarged lymph nodes.  Exam:  BP 132/84  P 88  T 98.1 F   R 16  Ht 4' 11.5"   Wt 249 lb 6.4 oz   BMI 49.55  Appears well nourished and in no distress. Eyes: PERRLA, EOMs, conjunctiva no swelling or erythema. Sinuses: No frontal/maxillary tenderness ENT/Mouth: EAC's clear, TM's nl w/o erythema, bulging. Nares clear w/o erythema, swelling, exudates. Oropharynx clear without erythema or exudates. Oral hygiene is good. Tongue normal, non obstructing. Hearing intact.  Neck: Supple. Thyroid nl. Car 2+/2+ without bruits, nodes or JVD. Chest: Respirations nl with BS clear & equal w/o rales, rhonchi, wheezing or stridor.  Cor: Heart sounds normal w/ regular rate and rhythm without sig. murmurs, gallops, clicks, or rubs. Peripheral pulses normal and equal  without edema.  Abdomen: Soft & bowel sounds normal. Non-tender w/o guarding, rebound, hernias, masses, or organomegaly.  Lymphatics: Unremarkable.  Musculoskeletal: Full ROM all peripheral extremities, joint stability, 5/5 strength, and normal gait.  Skin: Warm, dry without exposed rashes, lesions or  ecchymosis apparent.  Neuro: Cranial nerves intact, reflexes equal bilaterally. Sensory-motor testing grossly intact. Tendon reflexes grossly intact.  Pysch: Alert & oriented x 3.  Insight and judgement nl & appropriate. No ideations.  Assessment and Plan:  1. Hypertension - Continue monitor blood pressure at home. Continue diet/meds same.  2. Hyperlipidemia - Continue diet/meds, exercise,& lifestyle modifications. Continue monitor periodic cholesterol/liver & renal functions   3. Pre-Diabetes - Continue diet, exercise, lifestyle modifications. Monitor appropriate labs.  4. Vitamin D Deficiency - Continue supplementation.  5. Morbid Obesity - Diet & weight loss discussed  6. UTI , recent - recheck UA/CS  Recommended regular exercise, BP monitoring, weight control, and discussed med and SE's. Recommended labs to assess and monitor clinical status. Further disposition pending results of labs.

## 2013-10-22 NOTE — Patient Instructions (Signed)
Asymptomatic Bacteriuria Asymptomatic bacteriuria is the presence of a large number of bacteria in your urine without the usual symptoms of burning or frequent urination. The following conditions increase the risk of asymptomatic bacteriuria:  Diabetes mellitus.  Advanced age.  Pregnancy in the first trimester.  Kidney stones.  Kidney transplants.  Leaky kidney tube valve in young children (reflux). Treatment for this condition is not needed in most people and can lead to other problems such as too much yeast and growth of resistant bacteria. However, some people, such as pregnant women, do need treatment to prevent kidney infection. Asymptomatic bacteriuria in pregnancy is also associated with fetal growth restriction, premature labor, and newborn death. HOME CARE INSTRUCTIONS Monitor your condition for any changes. The following actions may help to relieve any discomfort you are feeling:  Drink enough water and fluids to keep your urine clear or pale yellow. Go to the bathroom more often to keep your bladder empty.  Keep the area around your vagina and rectum clean. Wipe yourself from front to back after urinating. SEEK IMMEDIATE MEDICAL CARE IF:  You develop signs of an infection such as:  Burning with urination.  Frequency of voiding.  Back pain.  Fever.  You have blood in the urine.  You develop a fever. MAKE SURE YOU:  Understand these instructions.  Will watch your condition.  Will get help right away if you are not doing well or get worse. Document Released: 01/25/2005 Document Revised: 06/11/2013 Document Reviewed: 07/17/2012 [facility] Patient Information 2015 [place], [place]. This information is not intended to replace advice given to you by your health care provider. Make sure you discuss any questions you have with your health care provider.

## 2013-10-24 ENCOUNTER — Other Ambulatory Visit (INDEPENDENT_AMBULATORY_CARE_PROVIDER_SITE_OTHER): Payer: BC Managed Care – PPO

## 2013-10-24 DIAGNOSIS — N3000 Acute cystitis without hematuria: Secondary | ICD-10-CM

## 2013-10-25 LAB — URINALYSIS, MICROSCOPIC ONLY
Bacteria, UA: NONE SEEN
Casts: NONE SEEN
Crystals: NONE SEEN
Squamous Epithelial / LPF: NONE SEEN

## 2013-10-26 LAB — URINE CULTURE
Colony Count: NO GROWTH
Organism ID, Bacteria: NO GROWTH

## 2013-11-23 ENCOUNTER — Other Ambulatory Visit: Payer: Self-pay

## 2013-12-17 ENCOUNTER — Encounter: Payer: Self-pay | Admitting: Physician Assistant

## 2013-12-17 ENCOUNTER — Ambulatory Visit (INDEPENDENT_AMBULATORY_CARE_PROVIDER_SITE_OTHER): Payer: BC Managed Care – PPO | Admitting: Physician Assistant

## 2013-12-17 ENCOUNTER — Ambulatory Visit (HOSPITAL_COMMUNITY)
Admission: RE | Admit: 2013-12-17 | Discharge: 2013-12-17 | Disposition: A | Discharge status: Home / Self Care | Payer: BC Managed Care – PPO | Source: Ambulatory Visit | Attending: Internal Medicine | Admitting: Internal Medicine

## 2013-12-17 VITALS — BP 119/84 | HR 84 | Wt 254.0 lb

## 2013-12-17 VITALS — BP 138/82 | HR 96 | Temp 98.1°F | Resp 16 | Wt 254.0 lb

## 2013-12-17 DIAGNOSIS — Z9119 Patient's noncompliance with other medical treatment and regimen: Secondary | ICD-10-CM | POA: Diagnosis not present

## 2013-12-17 DIAGNOSIS — I481 Persistent atrial fibrillation: Secondary | ICD-10-CM | POA: Diagnosis not present

## 2013-12-17 DIAGNOSIS — Z79899 Other long term (current) drug therapy: Secondary | ICD-10-CM | POA: Diagnosis not present

## 2013-12-17 DIAGNOSIS — G4733 Obstructive sleep apnea (adult) (pediatric): Secondary | ICD-10-CM | POA: Insufficient documentation

## 2013-12-17 DIAGNOSIS — I5033 Acute on chronic diastolic (congestive) heart failure: Secondary | ICD-10-CM | POA: Insufficient documentation

## 2013-12-17 DIAGNOSIS — I480 Paroxysmal atrial fibrillation: Secondary | ICD-10-CM

## 2013-12-17 DIAGNOSIS — Z86711 Personal history of pulmonary embolism: Secondary | ICD-10-CM | POA: Diagnosis not present

## 2013-12-17 DIAGNOSIS — E669 Obesity, unspecified: Secondary | ICD-10-CM | POA: Insufficient documentation

## 2013-12-17 DIAGNOSIS — I4892 Unspecified atrial flutter: Secondary | ICD-10-CM | POA: Insufficient documentation

## 2013-12-17 DIAGNOSIS — Z7901 Long term (current) use of anticoagulants: Secondary | ICD-10-CM | POA: Insufficient documentation

## 2013-12-17 DIAGNOSIS — E1165 Type 2 diabetes mellitus with hyperglycemia: Secondary | ICD-10-CM

## 2013-12-17 DIAGNOSIS — R0902 Hypoxemia: Secondary | ICD-10-CM | POA: Diagnosis not present

## 2013-12-17 DIAGNOSIS — E119 Type 2 diabetes mellitus without complications: Secondary | ICD-10-CM | POA: Insufficient documentation

## 2013-12-17 DIAGNOSIS — E785 Hyperlipidemia, unspecified: Secondary | ICD-10-CM

## 2013-12-17 DIAGNOSIS — E039 Hypothyroidism, unspecified: Secondary | ICD-10-CM

## 2013-12-17 DIAGNOSIS — K219 Gastro-esophageal reflux disease without esophagitis: Secondary | ICD-10-CM | POA: Insufficient documentation

## 2013-12-17 DIAGNOSIS — Z9981 Dependence on supplemental oxygen: Secondary | ICD-10-CM | POA: Diagnosis not present

## 2013-12-17 DIAGNOSIS — G4730 Sleep apnea, unspecified: Secondary | ICD-10-CM | POA: Insufficient documentation

## 2013-12-17 DIAGNOSIS — Z91199 Patient's noncompliance with other medical treatment and regimen due to unspecified reason: Secondary | ICD-10-CM

## 2013-12-17 DIAGNOSIS — I10 Essential (primary) hypertension: Secondary | ICD-10-CM | POA: Diagnosis not present

## 2013-12-17 DIAGNOSIS — IMO0002 Reserved for concepts with insufficient information to code with codable children: Secondary | ICD-10-CM

## 2013-12-17 DIAGNOSIS — R0600 Dyspnea, unspecified: Secondary | ICD-10-CM

## 2013-12-17 DIAGNOSIS — J449 Chronic obstructive pulmonary disease, unspecified: Secondary | ICD-10-CM

## 2013-12-17 DIAGNOSIS — Z9114 Patient's other noncompliance with medication regimen: Secondary | ICD-10-CM | POA: Diagnosis not present

## 2013-12-17 DIAGNOSIS — I5032 Chronic diastolic (congestive) heart failure: Secondary | ICD-10-CM | POA: Diagnosis present

## 2013-12-17 DIAGNOSIS — E559 Vitamin D deficiency, unspecified: Secondary | ICD-10-CM

## 2013-12-17 MED ORDER — AZITHROMYCIN 250 MG PO TABS: ORAL_TABLET | ORAL | Status: AC

## 2013-12-17 MED ORDER — IPRATROPIUM-ALBUTEROL 0.5-2.5 (3) MG/3ML IN SOLN
3.0000 mL | Freq: Once | RESPIRATORY_TRACT | Status: AC
  Administered 2013-12-17: 3 mL via RESPIRATORY_TRACT

## 2013-12-17 NOTE — Patient Instructions (Signed)
Increase lasix to twice daily and increase potassium, monitor weight closely.  Take Zpak Try to get on CPAP   Do the following things EVERYDAY: 1) Weigh yourself in the morning before breakfast. Write it down and keep it in a log. 2) Take your medicines as prescribed 3) Eat low salt foods-Limit salt (sodium) to 2000 mg per day.  4) Stay as active as you can everyday 5) Limit all fluids for the day to less than 2 liters    Go to the ER if any CP, SOB, nausea, dizziness, severe HA, changes vision/speech

## 2013-12-17 NOTE — Progress Notes (Signed)
Assessment and Plan:  Hypertension: Continue medication, monitor blood pressure at home. Continue DASH diet.  Reminder to go to the ER if any CP, SOB, nausea, dizziness, severe HA, changes vision/speech, left arm numbness and tingling and jaw pain. Cholesterol: Continue diet and exercise. Check cholesterol.  Diabetes-Continue diet and exercise. Check A1C Vitamin D Def- check level and continue medications.  OSA- needs CPAP mask Dyspnea- likely mutlifactorial- CHF, COPD exacerbation and Afib RVR  - breathing treatment given in the office, O2 improved from 90-92%, states breathing improved after treatment, sent in zpak  -increase lasix to BID with increase in KCL, follow up with cardiology, has appointment today with heart failure clinic.   -Forgot to take cardizem, given 26m cardizem in the office.   Due to O2 and heart rate, patient encouraged to go to the ER however she declines, her son is driving her and she is going straight to cardio.  If any increasing shortness of breath, swelling, or chest pressure go to ER immediately.   Patient walked out without getting labs, will contact and bring back for lab only.  Continue diet and meds as discussed. Discussed med's effects and SE's.   HPI 53y.o. female  presents for 3 month follow up with hypertension, hyperlipidemia, diabetes and vitamin D. Her blood pressure has been controlled at home, today their BP is BP: 138/82 mmHg She does not workout. She denies chest pain, shortness of breath, dizziness.  She is not on cholesterol medication and denies myalgias. Her cholesterol is not at goal. The cholesterol was:  09/06/2013: Cholesterol, Total 193; HDL Cholesterol by NMR 60; LDL (calc) 102*; Triglycerides 153* She has been working on diet and exercise for Diabetes, and denies polydipsia, polyuria and visual disturbances. Last A1C  was: 09/06/2013: Hemoglobin-A1c 6.3* Patient is on Vitamin D supplement. 09/06/2013: Vit D, 25-Hydroxy 27*  She has  been having some problems with shortness, she is actively wheezing, she has had nonproductive cough, has been taking OTC cough and coricidin, she has had sinus issues for 2 weeks, declines fever, chills.  She has a history of afib and CHF, she is on amiodarone, cardizem, she is on xarleto, she has not taken any of her BP meds today. Her weight is up 13 lbs from July, she can not lay flat, worse in the past 2 weeks, she sleeps on pillows, wakes up every other hour to urinate, denies PND, edema.  She is on thyroid medication. Her medication was not changed last visit. Patient has been having weight gain and constipation.  Lab Results  Component Value Date   TSH 3.798 09/06/2013  .  BMI is Body mass index is 50.46 kg/(m^2)., she is has OSA and is not on a mask, states she is very tired during the day and can sleep anywhere. She states she has the CPAP machine but not the mask. She has had increase stress with her 173 1/2year old grand son dying in Sept, she is very tearful.  Wt Readings from Last 3 Encounters:  12/17/13 254 lb (115.214 kg)  10/22/13 249 lb 6.4 oz (113.127 kg)  09/06/13 241 lb (109.317 kg)     Current Medications:  Current Outpatient Prescriptions on File Prior to Visit  Medication Sig Dispense Refill  . amiodarone (PACERONE) 200 MG tablet Take 1 tablet (200 mg total) by mouth daily. 30 tablet 3  . diltiazem (CARDIZEM CD) 360 MG 24 hr capsule Take 1 capsule (360 mg total) by mouth daily. 30 capsule 3  .  furosemide (LASIX) 40 MG tablet 1 tab daily and may take extra tab if weigh climbing 60 tablet 3  . levothyroxine (SYNTHROID, LEVOTHROID) 100 MCG tablet 1 pill daily 30-60 mins before food 30 tablet 3  . metFORMIN (GLUCOPHAGE XR) 500 MG 24 hr tablet Take 1 or 2 tablets once or twice daily as directed for Diabetes 120 tablet 1  . OVER THE COUNTER MEDICATION as needed. Equate Night time sleep aid    . potassium chloride SA (K-DUR,KLOR-CON) 20 MEQ tablet Take 1 tablet (20 mEq total)  by mouth daily. Take extra tab when you take extra Furosemide 45 tablet 3  . rivaroxaban (XARELTO) 20 MG TABS tablet Take 1 tablet (20 mg total) by mouth daily. 30 tablet 3  . sertraline (ZOLOFT) 100 MG tablet Take 1 tablet (100 mg total) by mouth daily. 30 tablet 2  . VENTOLIN HFA 108 (90 BASE) MCG/ACT inhaler Inhale into the lungs every 4 (four) hours as needed.      No current facility-administered medications on file prior to visit.   Medical History:  Past Medical History  Diagnosis Date  . GERD (gastroesophageal reflux disease)   . Depression   . Persistent atrial fibrillation     Myoview 4/09: No ischemia;  echo 4/09: EF 60-65%;   Flecainide, beta blocker, Coumadin;    Unable to take Tikosyn 2/2 prolonged QT  . Obstructive sleep apnea   . Borderline diabetes   . Atrial flutter   . Chronic diastolic heart failure   . Pulmonary embolism     Right lower lobe diagnosed by CT 6/12  . COPD (chronic obstructive pulmonary disease)   . Hyperlipidemia   . HTN (hypertension)   . Type II or unspecified type diabetes mellitus without mention of complication, not stated as uncontrolled   . Anxiety   . Anemia    Allergies:  Allergies  Allergen Reactions  . Sulfonamide Derivatives Hives and Itching  . Tikosyn [Dofetilide]     Prolonged qt    Review of Systems:  See HPI  Family history- Review and unchanged Social history- Review and unchanged Physical Exam: BP 138/82 mmHg  Pulse 96  Temp(Src) 98.1 F (36.7 C)  Resp 16  Wt 254 lb (115.214 kg) Wt Readings from Last 3 Encounters:  12/17/13 254 lb (115.214 kg)  10/22/13 249 lb 6.4 oz (113.127 kg)  09/06/13 241 lb (109.317 kg)   General Appearance: Well nourished, obese, in no apparent distress. Eyes: PERRLA, EOMs, conjunctiva no swelling or erythema Sinuses: No Frontal/maxillary tenderness ENT/Mouth: Ext aud canals clear, TMs without erythema, bulging. No erythema, swelling, or exudate on post pharynx.  Tonsils not swollen or  erythematous. Hearing normal. Crowded mouth Neck: Supple, thyroid normal.  Respiratory: Decreased breath sounds bilateral, with wheezing and decreased breath sounds bilateral lower lobes Cardio: Irreg, irreg with RVR,  2-3 + edema Abdomen: Soft, + BS, obese,  Non tender, no guarding, rebound, hernias, masses. Lymphatics: Non tender without lymphadenopathy.  Musculoskeletal: Full ROM, 5/5 strength, antalgic gait Skin: Warm, dry without rashes, lesions, ecchymosis.  Neuro: Cranial nerves intact. No cerebellar symptoms.  Psych: Awake and oriented X 3, tearful, Insight and Judgment appropriate.    Vicie Mutters, PA-C 2:01 PM [facility]

## 2013-12-17 NOTE — Progress Notes (Addendum)
Patient ID: [name], female   DOB: 08-Jan-1961, 53 y.o.   MRN: [id number]   PCP:  Kelby Aline, PA-C (at Dr. Idell Pickles office) Primary Cardiologist:  Dr. [name] Pulmonologist: Dr Gwenette Greet EP: Dr Rayann Heman  History of Present Illness: [name] is a 53 y.o. female with a history of chronic diastolic heart failure, HTN, hyperlipidemia, sleep apnea, glucose intolerance, COPD and GERD as well as paroxysmal A fib.     She was admitted 6/2-6/11/2010.  Prior to admission, she had stopped all of her medications due to difficulties with finances.  She had been developing increasing dyspnea requiring several rounds of antibiotics and prednisone taper.  She presented to the hospital with narrow complex tachycardia treated with adenosine x2.  She developed normal sinus rhythm briefly and then converted to atrial flutter with rapid ventricular rate.  Myocardial infarction was ruled out.  She continued to have intermittent episodes of atrial flutter.  She was placed on flecainide.   She was seen by EP.  Given her multiple atrial arrhythmias, ablation therapy was not felt to be helpful and she was continued on flecainide.  She had continued dyspnea and CT scan demonstrated a right lower lobe nonocclusive pulmonary embolism.  She was placed on heparin and Coumadin.    Admitted 01/14/12 with afib with RVR (NSR 09/23/10) and volume overload.  During hospitalization Underwent TEE/DCCV and DCCV  which showed no thrombus.  She failed 2 cardioversions therefore rate control was pursued. Flecainide was stopped and transitioned to cardizem and amiodarone. She was also placed on Xarelto. Discharge weight 215 pounds.  She was back in a NSR 03/31/12    She returns for follow up. Earlier today she was evaluated by PCP for increased dyspnea. She was given a breathing treatment and  240 mg cardizem. PCP suggested she present to ED for evaluations however she declined. Did not take medications today. She stopped  xarelto and started aspirin several weeks ago.  Say she cant afford xarelto. She says she takes her medications about 3 days a week. Progressive dyspnea with exertion. Not following low salt diet. Eating fast food most days.   Labs  07/31/12 TSH 6.06 T4 1.14  ALT 40 11/22/12 K 3.5 Creatinine 1.15 09/06/13 K 3.99  SH; Works full time at [employer]. Lives with her 2 sons. Separated from her husband  ROS: All pertinent positives and negatives as in HPI, otherwise negative.     Past Medical History  Diagnosis Date  . GERD (gastroesophageal reflux disease)   . Depression   . Persistent atrial fibrillation     Myoview 4/09: No ischemia;  echo 4/09: EF 60-65%;   Flecainide, beta blocker, Coumadin;    Unable to take Tikosyn 2/2 prolonged QT  . Obstructive sleep apnea   . Borderline diabetes   . Atrial flutter   . Chronic diastolic heart failure   . Pulmonary embolism     Right lower lobe diagnosed by CT 6/12  . COPD (chronic obstructive pulmonary disease)   . Hyperlipidemia   . HTN (hypertension)   . Type II or unspecified type diabetes mellitus without mention of complication, not stated as uncontrolled   . Anxiety   . Anemia     Current Outpatient Prescriptions  Medication Sig Dispense Refill  . amiodarone (PACERONE) 200 MG tablet Take 1 tablet (200 mg total) by mouth daily. 30 tablet 3  . azithromycin (ZITHROMAX) 250 MG tablet Take 2 tablets (500 mg) on  Day 1,  followed  by 1 tablet (250 mg) once daily on Days 2 through 5. 6 each 1  . diltiazem (CARDIZEM CD) 360 MG 24 hr capsule Take 1 capsule (360 mg total) by mouth daily. 30 capsule 3  . furosemide (LASIX) 40 MG tablet 1 tab daily and may take extra tab if weigh climbing 60 tablet 3  . levothyroxine (SYNTHROID, LEVOTHROID) 100 MCG tablet 1 pill daily 30-60 mins before food 30 tablet 3  . metFORMIN (GLUCOPHAGE XR) 500 MG 24 hr tablet Take 1 or 2 tablets once or twice daily as directed for Diabetes 120 tablet 1  . OVER THE  COUNTER MEDICATION as needed. Equate Night time sleep aid    . potassium chloride SA (K-DUR,KLOR-CON) 20 MEQ tablet Take 1 tablet (20 mEq total) by mouth daily. Take extra tab when you take extra Furosemide 45 tablet 3  . rivaroxaban (XARELTO) 20 MG TABS tablet Take 1 tablet (20 mg total) by mouth daily. 30 tablet 3  . sertraline (ZOLOFT) 100 MG tablet Take 1 tablet (100 mg total) by mouth daily. 30 tablet 2   No current facility-administered medications for this encounter.    Allergies: Allergies  Allergen Reactions  . Sulfonamide Derivatives Hives and Itching  . Tikosyn [Dofetilide]     Prolonged qt   Filed Vitals:   12/17/13 1600  BP: 119/84  Pulse: 84  Weight: 254 lb (115.214 kg)  SpO2: 86%   PHYSICAL EXAM: Well nourished, well developed, in no acute distress HEENT: normal Neck: JVD hard to assess but appear elevated to jaw Cardiac:  Regular, normal S1, S2; no murmur Lungs:  Clear. Decreased bs throughout. no wheezing, rhonchi or rales  Abd: obese. soft, nontender, +++distention, no hepatomegaly Ext: R and LLE 1+  edema.  Neuro:  CNs 2-12 intact, no focal abnormalities noted  ASSESSMENT AND PLAN:  1) Acute/Chronic diastolic HF, EF 22%, mod MR - NYHA III -IVsymptoms. Marked volume overload noted in the setting of diet and medication noncompliance.  Increase lasix to 80 mg twice a day with 40 meq potassium twice a day. Tonight she take 5 mg metolazone 30 minutes before lasix. She will follow up in am for 80 mg IV lasix. I have offered hospital admit however she declines. Only agreeable to follow up in am. I discucssed with her son as well.  Reinforced the need and importance of daily weights, a low sodium diet, and fluid restriction (less than 2 L a day). Instructed to call the HF clinic if weight increases more than 3 lbs overnight or 5 lbs in a week.  2) Obesity - Needs to lose weight.  3) Hyperlipidemia - Managed by PCP 4) Atrial Fibrillation - maintaining NSR.  Continue amiodarone and  Diltiazem. She stopped Xarelto several weeks ago due to cost. CHADSVASC Score 4. Today she was provided with samples of Xarelto 20 mg and instructed to stop aspirin.  Needs yearly exam. Check TSH today  5) DM- Taking metoformin again. Followed by PCP.  6) Hypothyroid- not taking medicationfor last 6 months.  7) Hypoxia-  O2 sats at rest 86%. Previously had oxygen at home. She says she has oxygen at home and I have asked her to wear 2 liters oxygen continuously.  Will reorder from [facility]  8) Noncompliance- Strongly encouraged to take all medications and follow low salt diet.    Follow up in am for IV lasix.   Amyri Frenz NP-C 4:02 PM

## 2013-12-17 NOTE — Patient Instructions (Signed)
Take metolazone tablet when you get home 30 MINUTES BEFORE lasix and potassium.  Return to clinic at 8:00 am tomorrow morning for IV lasix.  Do the following things EVERYDAY: 1) Weigh yourself in the morning before breakfast. Write it down and keep it in a log. 2) Take your medicines as prescribed 3) Eat low salt foods-Limit salt (sodium) to 2000 mg per day.  4) Stay as active as you can everyday 5) Limit all fluids for the day to less than 2 liters

## 2013-12-18 ENCOUNTER — Ambulatory Visit (HOSPITAL_COMMUNITY)
Admission: RE | Admit: 2013-12-18 | Discharge: 2013-12-18 | Disposition: A | Discharge status: Home / Self Care | Payer: BC Managed Care – PPO | Source: Ambulatory Visit | Attending: Cardiology | Admitting: Cardiology

## 2013-12-18 VITALS — BP 120/70 | HR 97 | Wt 245.0 lb

## 2013-12-18 DIAGNOSIS — G4733 Obstructive sleep apnea (adult) (pediatric): Secondary | ICD-10-CM | POA: Diagnosis not present

## 2013-12-18 DIAGNOSIS — I10 Essential (primary) hypertension: Secondary | ICD-10-CM | POA: Insufficient documentation

## 2013-12-18 DIAGNOSIS — E119 Type 2 diabetes mellitus without complications: Secondary | ICD-10-CM | POA: Diagnosis not present

## 2013-12-18 DIAGNOSIS — Z9981 Dependence on supplemental oxygen: Secondary | ICD-10-CM | POA: Diagnosis not present

## 2013-12-18 DIAGNOSIS — Z9119 Patient's noncompliance with other medical treatment and regimen: Secondary | ICD-10-CM | POA: Diagnosis not present

## 2013-12-18 DIAGNOSIS — E669 Obesity, unspecified: Secondary | ICD-10-CM | POA: Diagnosis not present

## 2013-12-18 DIAGNOSIS — R0902 Hypoxemia: Secondary | ICD-10-CM | POA: Insufficient documentation

## 2013-12-18 DIAGNOSIS — Z7982 Long term (current) use of aspirin: Secondary | ICD-10-CM | POA: Insufficient documentation

## 2013-12-18 DIAGNOSIS — Z79899 Other long term (current) drug therapy: Secondary | ICD-10-CM | POA: Diagnosis not present

## 2013-12-18 DIAGNOSIS — K219 Gastro-esophageal reflux disease without esophagitis: Secondary | ICD-10-CM | POA: Insufficient documentation

## 2013-12-18 DIAGNOSIS — E039 Hypothyroidism, unspecified: Secondary | ICD-10-CM | POA: Diagnosis not present

## 2013-12-18 DIAGNOSIS — I482 Chronic atrial fibrillation, unspecified: Secondary | ICD-10-CM

## 2013-12-18 DIAGNOSIS — I4892 Unspecified atrial flutter: Secondary | ICD-10-CM | POA: Insufficient documentation

## 2013-12-18 DIAGNOSIS — Z91199 Patient's noncompliance with other medical treatment and regimen due to unspecified reason: Secondary | ICD-10-CM | POA: Insufficient documentation

## 2013-12-18 DIAGNOSIS — E785 Hyperlipidemia, unspecified: Secondary | ICD-10-CM | POA: Insufficient documentation

## 2013-12-18 DIAGNOSIS — J449 Chronic obstructive pulmonary disease, unspecified: Secondary | ICD-10-CM | POA: Insufficient documentation

## 2013-12-18 DIAGNOSIS — I5033 Acute on chronic diastolic (congestive) heart failure: Secondary | ICD-10-CM | POA: Insufficient documentation

## 2013-12-18 DIAGNOSIS — I5032 Chronic diastolic (congestive) heart failure: Secondary | ICD-10-CM | POA: Diagnosis not present

## 2013-12-18 DIAGNOSIS — Z86711 Personal history of pulmonary embolism: Secondary | ICD-10-CM | POA: Diagnosis not present

## 2013-12-18 DIAGNOSIS — Z7901 Long term (current) use of anticoagulants: Secondary | ICD-10-CM | POA: Diagnosis not present

## 2013-12-18 DIAGNOSIS — Z9114 Patient's other noncompliance with medication regimen: Secondary | ICD-10-CM | POA: Diagnosis not present

## 2013-12-18 DIAGNOSIS — I480 Paroxysmal atrial fibrillation: Secondary | ICD-10-CM | POA: Insufficient documentation

## 2013-12-18 LAB — BASIC METABOLIC PANEL
Anion gap: 10 (ref 5–15)
BUN: 17 mg/dL (ref 6–23)
CO2: 39 mEq/L — ABNORMAL HIGH (ref 19–32)
Calcium: 9.6 mg/dL (ref 8.4–10.5)
Chloride: 96 mEq/L (ref 96–112)
Creatinine, Ser: 1.3 mg/dL — ABNORMAL HIGH (ref 0.50–1.10)
GFR calc Af Amer: 53 mL/min — ABNORMAL LOW (ref >90)
GFR calc non Af Amer: 45 mL/min — ABNORMAL LOW (ref >90)
Glucose, Bld: 97 mg/dL (ref 70–99)
Potassium: 3.8 mEq/L (ref 3.7–5.3)
Sodium: 145 mEq/L (ref 137–147)

## 2013-12-18 LAB — PRO B NATRIURETIC PEPTIDE: Pro B Natriuretic peptide (BNP): 1242 pg/mL — ABNORMAL HIGH (ref 0–125)

## 2013-12-18 MED ORDER — POTASSIUM CHLORIDE CRYS ER 20 MEQ PO TBCR
40.0000 meq | EXTENDED_RELEASE_TABLET | Freq: Once | ORAL | Status: AC
  Administered 2013-12-18: 40 meq via ORAL

## 2013-12-18 MED ORDER — FUROSEMIDE 10 MG/ML IJ SOLN
80.0000 mg | Freq: Once | INTRAMUSCULAR | Status: AC
  Administered 2013-12-18: 80 mg via INTRAVENOUS

## 2013-12-18 NOTE — Progress Notes (Signed)
Patient ID: [name], female   DOB: 1960-02-26, 53 y.o.   MRN: [id number]   PCP:  Kelby Aline, PA-C (at Dr. Idell Pickles office) Primary Cardiologist:  Dr. [name] Pulmonologist: Dr Gwenette Greet EP: Dr Rayann Heman  History of Present Illness: [name] is a 53 y.o. female with a history of chronic diastolic heart failure, HTN, hyperlipidemia, sleep apnea, glucose intolerance, COPD and GERD as well as paroxysmal A fib.     She was admitted 6/2-6/11/2010.  Prior to admission, she had stopped all of her medications due to difficulties with finances.  She had been developing increasing dyspnea requiring several rounds of antibiotics and prednisone taper.  She presented to the hospital with narrow complex tachycardia treated with adenosine x2.  She developed normal sinus rhythm briefly and then converted to atrial flutter with rapid ventricular rate.  Myocardial infarction was ruled out.  She continued to have intermittent episodes of atrial flutter.  She was placed on flecainide.   She was seen by EP.  Given her multiple atrial arrhythmias, ablation therapy was not felt to be helpful and she was continued on flecainide.  She had continued dyspnea and CT scan demonstrated a right lower lobe nonocclusive pulmonary embolism.  She was placed on heparin and Coumadin.    Admitted 01/14/12 with afib with RVR (NSR 09/23/10) and volume overload.  During hospitalization Underwent TEE/DCCV and DCCV  which showed no thrombus.  She failed 2 cardioversions therefore rate control was pursued. Flecainide was stopped and transitioned to cardizem and amiodarone. She was also placed on Xarelto. Discharge weight 215 pounds.  She returns for follow up. Yesterday she had marked volume overload. She was instructed to take 5 mg of metolazone and 80 mg po lasix. Weight has gone down 9 pounds. Remains SOB with exertion. +Orthopnea. Says its a little better. Has all medications at home except she did not pick up her Zpack.   Lower extremity edema. Increased abdominal girth.   Labs  07/31/12 TSH 6.06 T4 1.14  ALT 40 11/22/12 K 3.5 Creatinine 1.15 09/06/13 K 3.99  SH; Works full time at [employer]. Lives with her 2 sons. Separated from her husband  ROS: All pertinent positives and negatives as in HPI, otherwise negative.     Past Medical History  Diagnosis Date  . GERD (gastroesophageal reflux disease)   . Depression   . Persistent atrial fibrillation     Myoview 4/09: No ischemia;  echo 4/09: EF 60-65%;   Flecainide, beta blocker, Coumadin;    Unable to take Tikosyn 2/2 prolonged QT  . Obstructive sleep apnea   . Borderline diabetes   . Atrial flutter   . Chronic diastolic heart failure   . Pulmonary embolism     Right lower lobe diagnosed by CT 6/12  . COPD (chronic obstructive pulmonary disease)   . Hyperlipidemia   . HTN (hypertension)   . Type II or unspecified type diabetes mellitus without mention of complication, not stated as uncontrolled   . Anxiety   . Anemia     Current Outpatient Prescriptions  Medication Sig Dispense Refill  . amiodarone (PACERONE) 200 MG tablet Take 1 tablet (200 mg total) by mouth daily. 30 tablet 3  . aspirin 325 MG tablet Take 325 mg by mouth daily.    Marland Kitchen azithromycin (ZITHROMAX) 250 MG tablet Take 2 tablets (500 mg) on  Day 1,  followed by 1 tablet (250 mg) once daily on Days 2 through 5. 6 each 1  .  budesonide-formoterol (SYMBICORT) 160-4.5 MCG/ACT inhaler Inhale 1 puff into the lungs once a week.    Marland Kitchen dextromethorphan (DELSYM) 30 MG/5ML liquid Take 60 mg by mouth as needed for cough.    . Dextromethorphan-Guaifenesin (CORICIDIN HBP CONGESTION/COUGH) 10-200 MG CAPS Take 1 capsule by mouth every 4 (four) hours as needed (cough).    Marland Kitchen diltiazem (CARDIZEM CD) 360 MG 24 hr capsule Take 1 capsule (360 mg total) by mouth daily. 30 capsule 3  . DM-Phenylephrine-Acetaminophen 10-5-325 MG TABS Take 2 tablets by mouth every 4 (four) hours as needed (cough).    .  furosemide (LASIX) 40 MG tablet 1 tab daily and may take extra tab if weigh climbing (Patient taking differently: Take 80 mg by mouth daily. may take extra tab if weigh climbing) 60 tablet 3  . guaifenesin (HUMIBID E) 400 MG TABS tablet Take 400 mg by mouth every 4 (four) hours as needed (cough).    Marland Kitchen levothyroxine (SYNTHROID, LEVOTHROID) 100 MCG tablet 1 pill daily 30-60 mins before food 30 tablet 3  . metFORMIN (GLUCOPHAGE XR) 500 MG 24 hr tablet Take 1 or 2 tablets once or twice daily as directed for Diabetes (Patient taking differently: Take 500 mg by mouth daily with breakfast. ) 120 tablet 1  . OVER THE COUNTER MEDICATION as needed. Equate Night time sleep aid    . potassium chloride SA (K-DUR,KLOR-CON) 20 MEQ tablet Take 1 tablet (20 mEq total) by mouth daily. Take extra tab when you take extra Furosemide 45 tablet 3  . rivaroxaban (XARELTO) 20 MG TABS tablet Take 1 tablet (20 mg total) by mouth daily. 30 tablet 3  . sertraline (ZOLOFT) 100 MG tablet Take 1 tablet (100 mg total) by mouth daily. (Patient taking differently: Take 50 mg by mouth daily. ) 30 tablet 2   No current facility-administered medications for this encounter.    Allergies: Allergies  Allergen Reactions  . Sulfonamide Derivatives Hives and Itching  . Tikosyn [Dofetilide]     Prolonged qt   Filed Vitals:   12/18/13 0811  BP: 120/70  Pulse: 97  Weight: 245 lb (111.131 kg)  SpO2: 86%   PHYSICAL EXAM: Well nourished, well developed, dyspnea walking in the clinic. n no acute distress HEENT: normal Neck: JVD hard to assess but appears down to around 10-11 Cardiac:  Regular, normal S1, S2; no murmur Lungs:  Clear. Decreased bs throughout. no wheezing, rhonchi or rales  Abd: obese. soft, nontender, +++distention, no hepatomegaly Ext: R and LLE 1+  edema.  Neuro:  CNs 2-12 intact, no focal abnormalities noted  EKG: A Fib 97 bpm  ASSESSMENT AND PLAN:  1) Acute/Chronic diastolic HF, EF 31%, mod MR - NYHA III  -IVsymptoms. Marked volume overload noted in the setting of diet and medication noncompliance.  Yesterday she recived 5 mg metolazone and 80 mg po lasix. Weight down 9 pounds. Still with volume overload.  Give 80 mg IV lasix now and 40 meq of potassium . Good urine output noted 900 cc collected on the clinic.  Check BMET and Pro BNP now. K 3.8 Creatinine 1.30 Pro BNP 1242 Continue lasix 80 mg twice a day with 40 meq potassium.  Reinforced the need and importance of daily weights, a low sodium diet, and fluid restriction (less than 2 L a day). Instructed to call the HF clinic if weight increases more than 3 lbs overnight or 5 lbs in a week.  2) Obesity - Needs to lose weight.  3) Hyperlipidemia - Managed by  PCP 4) Chronic Atrial Fibrillation. In A fib today with controlled rate.  Continue amiodarone and  Diltiazem. Provided with Xarelto samples yesterday. CHADSVASC Score 4.  The HF pharmacist was able to contact [place] and she will receive 12 months free of Xarelto.  5) DM- Taking metoformin again. Watch renal function if greater than 1.4 will need stop Followed by PCP.  6) Hypothyroid- not taking medicationsfor last 6 months. Followed by PCP. 7) Hypoxia-  O2 sats at rest 86%. Oxygen saturations imporved to 95 % on 2 liters oxygen. Previously had oxygen at home. She says she has oxygen at home and I have asked her to wear 2 liters oxygen continuously.  Will reorder from [facility]. I am not sure will comply.  8) Noncompliance-  - As above she has stopped medications and restarts. She usually only takes all medications about 3 days a week. Strongly encouraged to take all medications and follow low salt diet.    Follow up next week to reassess volume. We will need to reinforce ongoing medication complianace at every visit.  CLEGG,AMY NP-C 8:03 AM

## 2013-12-18 NOTE — Addendum Note (Signed)
Encounter addended by: Conrad [place], NP on: 12/18/2013  8:03 AM<BR>     Documentation filed: Notes Section

## 2013-12-18 NOTE — Patient Instructions (Signed)
Follow up in 1 week with our Doctors.  [facility] will be in touch with you this week to arrange home oxygen.  Call us before your next appointment if your weight goes up or you experience increased shortness of breath.  Do the following things EVERYDAY: 1) Weigh yourself in the morning before breakfast. Write it down and keep it in a log. 2) Take your medicines as prescribed 3) Eat low salt foods-Limit salt (sodium) to 2000 mg per day.  4) Stay as active as you can everyday 5) Limit all fluids for the day to less than 2 liters

## 2013-12-18 NOTE — Progress Notes (Signed)
SATURATION QUALIFICATIONS: (This note is used to comply with regulatory documentation for home oxygen)  Patient Saturations on Room Air at Rest = 86%  Please briefly explain why patient needs home oxygen: Patient sitting on RA sats 84-86%, short of breathe.  Patient placed on 2L continuous O2 via nasal cannula, says up to 96%.

## 2013-12-18 NOTE — Progress Notes (Signed)
Patient brought in this morning for 80 IV lasix.  PIV 24g started in [facility], 2 attempts, patient tolerated well.  80 iv lasix administered, saline locked.  40 meq PO potassium given as well.  Patient resting comfortably in clinic with call bell in reach.  Total Urinary output: 900c  PIV removed before patient discharged from appointment.  Renee Pain

## 2013-12-24 ENCOUNTER — Ambulatory Visit (HOSPITAL_COMMUNITY)
Admission: RE | Admit: 2013-12-24 | Discharge: 2013-12-24 | Disposition: A | Discharge status: Home / Self Care | Payer: BC Managed Care – PPO | Source: Ambulatory Visit | Attending: Internal Medicine | Admitting: Internal Medicine

## 2013-12-24 VITALS — BP 118/80 | HR 90 | Wt 228.0 lb

## 2013-12-24 DIAGNOSIS — I10 Essential (primary) hypertension: Secondary | ICD-10-CM | POA: Insufficient documentation

## 2013-12-24 DIAGNOSIS — E039 Hypothyroidism, unspecified: Secondary | ICD-10-CM | POA: Insufficient documentation

## 2013-12-24 DIAGNOSIS — K219 Gastro-esophageal reflux disease without esophagitis: Secondary | ICD-10-CM | POA: Diagnosis not present

## 2013-12-24 DIAGNOSIS — Z86711 Personal history of pulmonary embolism: Secondary | ICD-10-CM | POA: Diagnosis not present

## 2013-12-24 DIAGNOSIS — F419 Anxiety disorder, unspecified: Secondary | ICD-10-CM | POA: Insufficient documentation

## 2013-12-24 DIAGNOSIS — I482 Chronic atrial fibrillation: Secondary | ICD-10-CM | POA: Insufficient documentation

## 2013-12-24 DIAGNOSIS — D367 Benign neoplasm of other specified sites: Secondary | ICD-10-CM | POA: Insufficient documentation

## 2013-12-24 DIAGNOSIS — I5033 Acute on chronic diastolic (congestive) heart failure: Secondary | ICD-10-CM | POA: Diagnosis not present

## 2013-12-24 DIAGNOSIS — I4892 Unspecified atrial flutter: Secondary | ICD-10-CM | POA: Diagnosis not present

## 2013-12-24 DIAGNOSIS — Z9114 Patient's other noncompliance with medication regimen: Secondary | ICD-10-CM | POA: Diagnosis not present

## 2013-12-24 DIAGNOSIS — E785 Hyperlipidemia, unspecified: Secondary | ICD-10-CM | POA: Insufficient documentation

## 2013-12-24 DIAGNOSIS — F329 Major depressive disorder, single episode, unspecified: Secondary | ICD-10-CM | POA: Diagnosis not present

## 2013-12-24 DIAGNOSIS — I5032 Chronic diastolic (congestive) heart failure: Secondary | ICD-10-CM

## 2013-12-24 DIAGNOSIS — E119 Type 2 diabetes mellitus without complications: Secondary | ICD-10-CM | POA: Diagnosis not present

## 2013-12-24 DIAGNOSIS — Z7901 Long term (current) use of anticoagulants: Secondary | ICD-10-CM | POA: Diagnosis not present

## 2013-12-24 DIAGNOSIS — E669 Obesity, unspecified: Secondary | ICD-10-CM | POA: Insufficient documentation

## 2013-12-24 DIAGNOSIS — J449 Chronic obstructive pulmonary disease, unspecified: Secondary | ICD-10-CM | POA: Diagnosis not present

## 2013-12-24 DIAGNOSIS — G4733 Obstructive sleep apnea (adult) (pediatric): Secondary | ICD-10-CM | POA: Diagnosis not present

## 2013-12-24 DIAGNOSIS — I4891 Unspecified atrial fibrillation: Secondary | ICD-10-CM

## 2013-12-24 DIAGNOSIS — Z79899 Other long term (current) drug therapy: Secondary | ICD-10-CM | POA: Diagnosis not present

## 2013-12-24 DIAGNOSIS — L089 Local infection of the skin and subcutaneous tissue, unspecified: Secondary | ICD-10-CM

## 2013-12-24 DIAGNOSIS — R0902 Hypoxemia: Secondary | ICD-10-CM | POA: Diagnosis not present

## 2013-12-24 LAB — BASIC METABOLIC PANEL
Anion gap: 14 (ref 5–15)
BUN: 29 mg/dL — ABNORMAL HIGH (ref 6–23)
CO2: 41 mEq/L (ref 19–32)
Calcium: 9.9 mg/dL (ref 8.4–10.5)
Chloride: 84 mEq/L — ABNORMAL LOW (ref 96–112)
Creatinine, Ser: 1.61 mg/dL — ABNORMAL HIGH (ref 0.50–1.10)
GFR calc Af Amer: 41 mL/min — ABNORMAL LOW (ref >90)
GFR calc non Af Amer: 35 mL/min — ABNORMAL LOW (ref >90)
Glucose, Bld: 83 mg/dL (ref 70–99)
Potassium: 3.2 mEq/L — ABNORMAL LOW (ref 3.7–5.3)
Sodium: 139 mEq/L (ref 137–147)

## 2013-12-24 LAB — PRO B NATRIURETIC PEPTIDE: Pro B Natriuretic peptide (BNP): 234.8 pg/mL — ABNORMAL HIGH (ref 0–125)

## 2013-12-24 MED ORDER — POTASSIUM CHLORIDE ER 10 MEQ PO TBCR: 20.0000 meq | EXTENDED_RELEASE_TABLET | Freq: Every day | ORAL | Status: DC

## 2013-12-24 MED ORDER — FUROSEMIDE 40 MG PO TABS: 40.0000 mg | ORAL_TABLET | Freq: Every day | ORAL | Status: DC

## 2013-12-24 MED ORDER — RIVAROXABAN 20 MG PO TABS: 20.0000 mg | ORAL_TABLET | Freq: Every day | ORAL | Status: DC

## 2013-12-24 NOTE — Patient Instructions (Signed)
Labs today  Lasix 40 mg daily Potassium 20 MeQ daily  You have been referred to Dermatology  Do the following things EVERYDAY: 1) Weigh yourself in the morning before breakfast. Write it down and keep it in a log. 2) Take your medicines as prescribed 3) Eat low salt foods-Limit salt (sodium) to 2000 mg per day.  4) Stay as active as you can everyday 5) Limit all fluids for the day to less than 2 liters 6)

## 2013-12-24 NOTE — Progress Notes (Signed)
Patient ID: [name], female   DOB: Jul 06, 1960, 53 y.o.   MRN: [id number]  PCP:  Kelby Aline, PA-C (at Dr. Idell Pickles office) Primary Cardiologist:  Dr. [name] Pulmonologist: Dr Gwenette Greet EP: Dr Rayann Heman  History of Present Illness: [name] is a 53 y.o. female with a history of chronic diastolic heart failure, HTN, hyperlipidemia, sleep apnea, glucose intolerance, COPD and GERD as well as paroxysmal A fib.     She was admitted 6/2-6/11/2010.  Prior to admission, she had stopped all of her medications due to difficulties with finances.  She had been developing increasing dyspnea requiring several rounds of antibiotics and prednisone taper.  She presented to the hospital with narrow complex tachycardia treated with adenosine x2.  She developed normal sinus rhythm briefly and then converted to atrial flutter with rapid ventricular rate.  Myocardial infarction was ruled out.  She continued to have intermittent episodes of atrial flutter.  She was placed on flecainide.   She was seen by EP.  Given her multiple atrial arrhythmias, ablation therapy was not felt to be helpful and she was continued on flecainide.  She had continued dyspnea and CT scan demonstrated a right lower lobe nonocclusive pulmonary embolism.  She was placed on heparin and Coumadin.    Admitted 01/14/12 with afib with RVR (NSR 09/23/10) and volume overload.  During hospitalization Underwent TEE/DCCV and DCCV  which showed no thrombus.  She failed 2 cardioversions therefore rate control was pursued. Flecainide was stopped and transitioned to cardizem and amiodarone. She was also placed on Xarelto. Discharge weight 215 pounds.  She returns for follow up. Last week she was volume overloaded and received increased po lasix and IV lasix. In the HF clinic. Weight at home 228 pounds. Overall weight down 26 pounds. Taking all medications. Overall she is feeling much better. Mild dizziness standing. Denies SOB. Sleeping on 2  pillows.  Following low salt diet and limiting fluid intake to 2 liters per day. Able to walk up steps.   Labs  07/31/12 TSH 6.06 T4 1.14  ALT 40 11/22/12 K 3.5 Creatinine 1.15 09/06/13 K 3.9 12/18/13 K 3.8 Creatinine 1.3 Pro BNP 1242   SH; Works full time at [employer]. Lives with her 2 sons. Separated from her husband  ROS: All pertinent positives and negatives as in HPI, otherwise negative.     Past Medical History  Diagnosis Date  . GERD (gastroesophageal reflux disease)   . Depression   . Persistent atrial fibrillation     Myoview 4/09: No ischemia;  echo 4/09: EF 60-65%;   Flecainide, beta blocker, Coumadin;    Unable to take Tikosyn 2/2 prolonged QT  . Obstructive sleep apnea   . Borderline diabetes   . Atrial flutter   . Chronic diastolic heart failure   . Pulmonary embolism     Right lower lobe diagnosed by CT 6/12  . COPD (chronic obstructive pulmonary disease)   . Hyperlipidemia   . HTN (hypertension)   . Type II or unspecified type diabetes mellitus without mention of complication, not stated as uncontrolled   . Anxiety   . Anemia     Current Outpatient Prescriptions  Medication Sig Dispense Refill  . amiodarone (PACERONE) 200 MG tablet Take 1 tablet (200 mg total) by mouth daily. 30 tablet 3  . budesonide-formoterol (SYMBICORT) 160-4.5 MCG/ACT inhaler Inhale 1 puff into the lungs once a week.    Marland Kitchen dextromethorphan (DELSYM) 30 MG/5ML liquid Take 60 mg by mouth as needed  for cough.    . Dextromethorphan-Guaifenesin (CORICIDIN HBP CONGESTION/COUGH) 10-200 MG CAPS Take 1 capsule by mouth every 4 (four) hours as needed (cough).    Marland Kitchen diltiazem (CARDIZEM CD) 360 MG 24 hr capsule Take 1 capsule (360 mg total) by mouth daily. 30 capsule 3  . DM-Phenylephrine-Acetaminophen 10-5-325 MG TABS Take 2 tablets by mouth every 4 (four) hours as needed (cough).    . furosemide (LASIX) 40 MG tablet 1 tab daily and may take extra tab if weigh climbing (Patient taking  differently: Take 80 mg by mouth daily. may take extra tab if weigh climbing) 60 tablet 3  . guaifenesin (HUMIBID E) 400 MG TABS tablet Take 400 mg by mouth every 4 (four) hours as needed (cough).    Marland Kitchen levothyroxine (SYNTHROID, LEVOTHROID) 100 MCG tablet 1 pill daily 30-60 mins before food (Patient taking differently: Take 100 mcg by mouth daily before breakfast. 1 pill daily 30-60 mins before food) 30 tablet 3  . metFORMIN (GLUCOPHAGE XR) 500 MG 24 hr tablet Take 1 or 2 tablets once or twice daily as directed for Diabetes (Patient taking differently: Take 500 mg by mouth daily with breakfast. ) 120 tablet 1  . OVER THE COUNTER MEDICATION as needed. Equate Night time sleep aid    . potassium chloride SA (K-DUR,KLOR-CON) 20 MEQ tablet Take 1 tablet (20 mEq total) by mouth daily. Take extra tab when you take extra Furosemide (Patient taking differently: Take 40 mEq by mouth daily. Take extra tab when you take extra Furosemide) 45 tablet 3  . rivaroxaban (XARELTO) 20 MG TABS tablet Take 1 tablet (20 mg total) by mouth daily. 30 tablet 3  . sertraline (ZOLOFT) 100 MG tablet Take 1 tablet (100 mg total) by mouth daily. (Patient taking differently: Take 50 mg by mouth daily. ) 30 tablet 2   No current facility-administered medications for this encounter.    Allergies: Allergies  Allergen Reactions  . Sulfonamide Derivatives Hives and Itching  . Tikosyn [Dofetilide]     Prolonged qt   Filed Vitals:   12/24/13 1420  BP: 118/80  Pulse: 90  Weight: 228 lb (103.42 kg)  SpO2: 93%   PHYSICAL EXAM: Well nourished, well developed, No acute distress HEENT: normalNose black irregular mole Neck: JVD flat Cardiac:  Regular, normal S1, S2; no murmur Lungs:  Clear. Decreased bs throughout. no wheezing, rhonchi or rales  Abd: obese. soft, nontender, non distended. No hepatomegaly Ext: R and LLE no edema.   Neuro:  CNs 2-12 intact, no focal abnormalities noted    ASSESSMENT AND PLAN:  1)  Acute/Chronic diastolic HF, EF 56%, mod MR - NYHA II-III. Volume status much improved after receiving IV lasix. Overall weight down 26 pounds.  Cut back laisx to 40 mg daily. Instructed to take an additional 40 mg of lasix for weight 233 pounds.   Check BMET and Pro BNP now.  Reinforced the need and importance of daily weights, a low sodium diet, and fluid restriction (less than 2 L a day). Instructed to call the HF clinic if weight increases more than 3 lbs overnight or 5 lbs in a week.  2) Obesity - Needs to lose weight. Encouraged to cut back portions.  3) Hyperlipidemia - Managed by PCP 4) Chronic Atrial Fibrillation. Controlled rate.  Continue amiodarone and  Diltiazem.  CHADSVASC Score 4.  Continue Xarelto 20 mg daily.   5) DM- Taking metoformin again. Watch renal function if greater than 1.4 will need stop Followed by PCP.  6) Hypothyroid- not taking medicationsfor last 6 months. Followed by PCP. 7) Hypoxia-  Resolved on oxygen 2 liters [place].  8) Noncompliance-  - Encouraged to comply with medicaiton regimen. 9) Nose- Irregular black mole noted. Refer to dermatology.  CLEGG,AMY NP-C 2:24 PM    Patient seen and examined with Darrick Grinder, NP. We discussed all aspects of the encounter. I agree with the assessment and plan as stated above.   Volume status much improved. Agree with diuretic changes as above. Long talk about use of sliding scale diuretics. Lesion on nose almost certainly malignant. Stressed to her and her son need to have this evaluated ASAP.   Daniel Bensimhon,MD 3:22 PM

## 2013-12-31 ENCOUNTER — Other Ambulatory Visit (HOSPITAL_COMMUNITY): Payer: Self-pay | Admitting: Cardiology

## 2013-12-31 DIAGNOSIS — I5032 Chronic diastolic (congestive) heart failure: Secondary | ICD-10-CM

## 2013-12-31 DIAGNOSIS — I4891 Unspecified atrial fibrillation: Secondary | ICD-10-CM

## 2014-01-01 ENCOUNTER — Ambulatory Visit (HOSPITAL_COMMUNITY)
Admission: RE | Admit: 2014-01-01 | Discharge: 2014-01-01 | Disposition: A | Discharge status: Home / Self Care | Payer: BC Managed Care – PPO | Source: Ambulatory Visit | Attending: Internal Medicine | Admitting: Internal Medicine

## 2014-01-01 DIAGNOSIS — I5022 Chronic systolic (congestive) heart failure: Secondary | ICD-10-CM

## 2014-01-01 DIAGNOSIS — I5032 Chronic diastolic (congestive) heart failure: Secondary | ICD-10-CM | POA: Insufficient documentation

## 2014-01-01 LAB — BASIC METABOLIC PANEL
Anion gap: 10 (ref 5–15)
BUN: 12 mg/dL (ref 6–23)
CO2: 33 mEq/L — ABNORMAL HIGH (ref 19–32)
Calcium: 9.3 mg/dL (ref 8.4–10.5)
Chloride: 103 mEq/L (ref 96–112)
Creatinine, Ser: 1.29 mg/dL — ABNORMAL HIGH (ref 0.50–1.10)
GFR calc Af Amer: 53 mL/min — ABNORMAL LOW (ref >90)
GFR calc non Af Amer: 46 mL/min — ABNORMAL LOW (ref >90)
Glucose, Bld: 107 mg/dL — ABNORMAL HIGH (ref 70–99)
Potassium: 4.5 mEq/L (ref 3.7–5.3)
Sodium: 146 mEq/L (ref 137–147)

## 2014-01-09 ENCOUNTER — Other Ambulatory Visit: Payer: Self-pay | Admitting: Physician Assistant

## 2014-01-09 MED ORDER — BUDESONIDE-FORMOTEROL FUMARATE 160-4.5 MCG/ACT IN AERO: 1.0000 | INHALATION_SPRAY | Freq: Every day | RESPIRATORY_TRACT | Status: DC

## 2014-01-09 MED ORDER — ALPRAZOLAM 0.5 MG PO TABS: ORAL_TABLET | ORAL | Status: DC

## 2014-01-15 MED ORDER — FUROSEMIDE 40 MG PO TABS: 40.0000 mg | ORAL_TABLET | Freq: Every day | ORAL | Status: DC

## 2014-01-15 MED ORDER — AMIODARONE HCL 200 MG PO TABS: 200.0000 mg | ORAL_TABLET | Freq: Every day | ORAL | Status: DC

## 2014-01-15 MED ORDER — POTASSIUM CHLORIDE ER 10 MEQ PO TBCR
40.0000 meq | EXTENDED_RELEASE_TABLET | Freq: Every day | ORAL | Status: DC
Start: 2014-01-15 — End: 2014-01-21

## 2014-01-15 MED ORDER — DILTIAZEM HCL ER COATED BEADS 360 MG PO CP24: 360.0000 mg | ORAL_CAPSULE | Freq: Every day | ORAL | Status: DC

## 2014-01-18 ENCOUNTER — Other Ambulatory Visit: Payer: Self-pay | Admitting: *Deleted

## 2014-01-18 MED ORDER — METFORMIN HCL ER 500 MG PO TB24: ORAL_TABLET | ORAL | Status: DC

## 2014-01-18 MED ORDER — BUDESONIDE-FORMOTEROL FUMARATE 160-4.5 MCG/ACT IN AERO
1.0000 | INHALATION_SPRAY | Freq: Every day | RESPIRATORY_TRACT | Status: DC
Start: 2014-01-18 — End: 2014-04-23

## 2014-01-18 MED ORDER — LEVOTHYROXINE SODIUM 100 MCG PO TABS: ORAL_TABLET | ORAL | Status: DC

## 2014-01-18 MED ORDER — SERTRALINE HCL 100 MG PO TABS
100.0000 mg | ORAL_TABLET | Freq: Every day | ORAL | Status: DC
Start: 2014-01-18 — End: 2015-01-23

## 2014-01-21 ENCOUNTER — Ambulatory Visit (HOSPITAL_COMMUNITY)
Admission: RE | Admit: 2014-01-21 | Discharge: 2014-01-21 | Disposition: A | Discharge status: Home / Self Care | Payer: BC Managed Care – PPO | Source: Ambulatory Visit | Attending: Internal Medicine | Admitting: Internal Medicine

## 2014-01-21 VITALS — BP 119/76 | HR 90 | Resp 22 | Wt 240.5 lb

## 2014-01-21 DIAGNOSIS — I10 Essential (primary) hypertension: Secondary | ICD-10-CM

## 2014-01-21 DIAGNOSIS — Z9119 Patient's noncompliance with other medical treatment and regimen: Secondary | ICD-10-CM | POA: Diagnosis not present

## 2014-01-21 DIAGNOSIS — I4891 Unspecified atrial fibrillation: Secondary | ICD-10-CM | POA: Diagnosis not present

## 2014-01-21 DIAGNOSIS — I5032 Chronic diastolic (congestive) heart failure: Secondary | ICD-10-CM

## 2014-01-21 DIAGNOSIS — Z79899 Other long term (current) drug therapy: Secondary | ICD-10-CM | POA: Diagnosis not present

## 2014-01-21 DIAGNOSIS — Z91199 Patient's noncompliance with other medical treatment and regimen due to unspecified reason: Secondary | ICD-10-CM

## 2014-01-21 LAB — BASIC METABOLIC PANEL
Anion gap: 11 (ref 5–15)
BUN: 18 mg/dL (ref 6–23)
CO2: 31 mEq/L (ref 19–32)
Calcium: 9.5 mg/dL (ref 8.4–10.5)
Chloride: 102 mEq/L (ref 96–112)
Creatinine, Ser: 1.28 mg/dL — ABNORMAL HIGH (ref 0.50–1.10)
GFR calc Af Amer: 54 mL/min — ABNORMAL LOW (ref >90)
GFR calc non Af Amer: 46 mL/min — ABNORMAL LOW (ref >90)
Glucose, Bld: 74 mg/dL (ref 70–99)
Potassium: 3.8 mEq/L (ref 3.7–5.3)
Sodium: 144 mEq/L (ref 137–147)

## 2014-01-21 LAB — PRO B NATRIURETIC PEPTIDE: Pro B Natriuretic peptide (BNP): 1563 pg/mL — ABNORMAL HIGH (ref 0–125)

## 2014-01-21 MED ORDER — POTASSIUM CHLORIDE ER 10 MEQ PO TBCR: 40.0000 meq | EXTENDED_RELEASE_TABLET | Freq: Every day | ORAL | Status: DC

## 2014-01-21 MED ORDER — FUROSEMIDE 40 MG PO TABS: ORAL_TABLET | ORAL | Status: DC

## 2014-01-21 NOTE — Patient Instructions (Addendum)
Take 2.5 mg metolazone today with an extra 40 mg of lasix.  Take an extra 40 meq (4 tablets) of potassium today.  Increase your daily dose of potassium to 40 meq (4 tablets) daily.  Increase your lasix to 40 mg (1 tablet) every morning and 20 mg (1/2 tablet) every evening. If weight at home less than 233 lbs then only take 40 mg daily.  Follow up 2 weeks for labs.  Follow up in 1 month  Do the following things EVERYDAY: 1) Weigh yourself in the morning before breakfast. Write it down and keep it in a log. 2) Take your medicines as prescribed 3) Eat low salt foods-Limit salt (sodium) to 2000 mg per day.  4) Stay as active as you can everyday 5) Limit all fluids for the day to less than 2 liters 6)

## 2014-01-21 NOTE — Progress Notes (Signed)
Patient ID: [name], female   DOB: 11-04-1960, 52 y.o.   MRN: [id number]  PCP:  Dr. Idell Pickles office Primary Cardiologist:  Dr. [name] Pulmonologist: Dr Gwenette Greet EP: Dr Rayann Heman  History of Present Illness: [name] is a 53 y.o. female with a history of chronic diastolic heart failure, HTN, hyperlipidemia, sleep apnea, glucose intolerance, COPD and GERD as well as paroxysmal A fib.     She was admitted 6/2-6/11/2010.  Prior to admission, she had stopped all of her medications due to difficulties with finances.  She had been developing increasing dyspnea requiring several rounds of antibiotics and prednisone taper.  She presented to the hospital with narrow complex tachycardia treated with adenosine x2.  She developed normal sinus rhythm briefly and then converted to atrial flutter with rapid ventricular rate.  Myocardial infarction was ruled out.  She continued to have intermittent episodes of atrial flutter.  She was placed on flecainide.   She was seen by EP.  Given her multiple atrial arrhythmias, ablation therapy was not felt to be helpful and she was continued on flecainide.  She had continued dyspnea and CT scan demonstrated a right lower lobe nonocclusive pulmonary embolism.  She was placed on heparin and Coumadin.    Admitted 01/14/12 with afib with RVR (NSR 09/23/10) and volume overload.  During hospitalization Underwent TEE/DCCV and DCCV  which showed no thrombus.  She failed 2 cardioversions therefore rate control was pursued. Flecainide was stopped and transitioned to cardizem and amiodarone. She was also placed on Xarelto. Discharge weight 215 pounds.  Follow up for Heart Failure: Last visit cut lasix back to 40 mg daily and referred to dermatology and last week did biopsy and has basal cell carcinoma. Has been out of amiodarone and diltiazem d/t switching to mail order and waiting on medications. Doing well. Denies SOB, PND, orthopnea or CP. Sleeps with 2L O2 nightly. DOE  with minimal exertion about 25 ft. Denies dizziness. Trying to follow a low salt diet and drink less than 2L a day. Able to go upstairs. Weight up 12 lbs since last visit. Has not been weighing daily.   Labs  07/31/12 TSH 6.06 T4 1.14  ALT 40 11/22/12 K 3.5 Creatinine 1.15 09/06/13 K 3.9 12/18/13 K 3.8 Creatinine 1.3 Pro BNP 1242  01/01/14: K 4.5, creatinine 1.29  SH; Works full time at [employer]. Lives with her 2 sons. Separated from her husband  ROS: All pertinent positives and negatives as in HPI, otherwise negative.     Past Medical History  Diagnosis Date  . GERD (gastroesophageal reflux disease)   . Depression   . Persistent atrial fibrillation     Myoview 4/09: No ischemia;  echo 4/09: EF 60-65%;   Flecainide, beta blocker, Coumadin;    Unable to take Tikosyn 2/2 prolonged QT  . Obstructive sleep apnea   . Borderline diabetes   . Atrial flutter   . Chronic diastolic heart failure   . Pulmonary embolism     Right lower lobe diagnosed by CT 6/12  . COPD (chronic obstructive pulmonary disease)   . Hyperlipidemia   . HTN (hypertension)   . Type II or unspecified type diabetes mellitus without mention of complication, not stated as uncontrolled   . Anxiety   . Anemia     Current Outpatient Prescriptions  Medication Sig Dispense Refill  . ALPRAZolam (XANAX) 0.5 MG tablet 1/2-1 pill PRN before procedure, if need to can take 1 at one time. Do not drive  if taking. 30 tablet 0  . aspirin-acetaminophen-caffeine (EXCEDRIN MIGRAINE) 672-897-91 MG per tablet Take 1 tablet by mouth every 6 (six) hours as needed for headache.    . budesonide-formoterol (SYMBICORT) 160-4.5 MCG/ACT inhaler Inhale 1 puff into the lungs daily. 3 Inhaler 4  . furosemide (LASIX) 40 MG tablet Take 1 tablet (40 mg total) by mouth daily. 30 tablet 3  . levothyroxine (SYNTHROID, LEVOTHROID) 100 MCG tablet 1 pill daily 30-60 mins before food 90 tablet 4  . OVER THE COUNTER MEDICATION as needed. Equate  Night time sleep aid    . potassium chloride (K-DUR) 10 MEQ tablet Take 4 tablets (40 mEq total) by mouth daily. (Patient taking differently: Take 20 mEq by mouth daily. ) 60 tablet 3  . potassium chloride SA (K-DUR,KLOR-CON) 20 MEQ tablet Take 1 tablet (20 mEq total) by mouth daily. Take extra tab when you take extra Furosemide (Patient taking differently: Take 40 mEq by mouth daily. Take extra tab when you take extra Furosemide) 45 tablet 3  . rivaroxaban (XARELTO) 20 MG TABS tablet Take 1 tablet (20 mg total) by mouth daily. 30 tablet 3  . sertraline (ZOLOFT) 100 MG tablet Take 1 tablet (100 mg total) by mouth daily. (Patient taking differently: Take 50 mg by mouth daily. ) 90 tablet 4  . amiodarone (PACERONE) 200 MG tablet Take 1 tablet (200 mg total) by mouth daily. (Patient not taking: Reported on 01/21/2014) 30 tablet 3  . diltiazem (CARDIZEM CD) 360 MG 24 hr capsule Take 1 capsule (360 mg total) by mouth daily. (Patient not taking: Reported on 01/21/2014) 30 capsule 3  . metFORMIN (GLUCOPHAGE XR) 500 MG 24 hr tablet Take 1 or 2 tablets once or twice daily as directed for Diabetes (Patient not taking: Reported on 01/21/2014) 360 tablet 4   No current facility-administered medications for this encounter.    Allergies: Allergies  Allergen Reactions  . Sulfonamide Derivatives Hives and Itching  . Tikosyn [Dofetilide]     Prolonged qt   Filed Vitals:   01/21/14 1458  BP: 119/76  Pulse: 90  Resp: 22  Weight: 240 lb 8 oz (109.09 kg)  SpO2: 90%   PHYSICAL EXAM: General: Well nourished, well developed, No acute distress HEENT: normalNose black irregular mole that was recently biopsied  Neck: JVD difficult to assess but appears 9-10 Cardiac: Regular rate and irregular rhythm; normal S1, S2; no murmur Lungs:  Clear. Decreased bs throughout. no wheezing, rhonchi or rales  Abd: obese. soft, nontender, non distended. No hepatomegaly Ext: R and LLE no edema.   Neuro:  CNs 2-12 intact, no  focal abnormalities noted  ASSESSMENT AND PLAN:  1) Acute/Chronic diastolic HF:  EF 50%, mod MR (01/2012) - NYHA III symptoms and volume status elevated. Weight is up 12 lbs since last visit. Will have her take an extra 40 mg lasix today with 2.5 mg metolazone then increase lasix to 40 mg qam and 20 mg qpm. If weight at home less than 233 lbs then only take 40 mg daily. -Take an extra 40 meq of KCL today and then increase daily dose to 40 meq daily.  - Check BMET and pro-BNP today and then in 7-10 days.  - Will eventually need repeat Echo but forgot to order for next visit today will order next visit.  - Reinforced the need and importance of daily weights, a low sodium diet, and fluid restriction (less than 2 L a day). Instructed to call the HF clinic if weight increases  more than 3 lbs overnight or 5 lbs in a week.  2) Obesity - Needs to lose weight. Encouraged to cut back portions and be more active. Eat more protein and cut back on carbohydrates.   3) Hyperlipidemia - Managed by PCP 4) Chronic Atrial Fibrillation. - Controlled rate.  Restart amiodarone and Diltiazem once she gets mail order medications. Discussed the need to get these medications. Continue Xarelto 20 mg daily.   5) DM- Taking metoformin again. Watch renal function if greater than 1.4 will need stop Followed by PCP.  6) Hypothyroid-  Was not taking medications for awhile. We discussed the need for medication compliance. Followed by PCP. 7) Hypoxia-  O2 sats low today on arrival. Discussed the need to wear O2 2L when ambulating. 8) Noncompliance-  - Encouraged to comply with medicaiton regimen. 9) Basal cell carcinoma- on nose. Continue to follow with dermatologist.   F/U 1 month  Junie Bame B NP-C 3:17 PM

## 2014-01-26 ENCOUNTER — Other Ambulatory Visit (HOSPITAL_COMMUNITY): Payer: Self-pay | Admitting: Cardiology

## 2014-01-26 DIAGNOSIS — I5032 Chronic diastolic (congestive) heart failure: Secondary | ICD-10-CM

## 2014-01-26 DIAGNOSIS — I4891 Unspecified atrial fibrillation: Secondary | ICD-10-CM

## 2014-01-26 MED ORDER — POTASSIUM CHLORIDE ER 10 MEQ PO TBCR: 40.0000 meq | EXTENDED_RELEASE_TABLET | Freq: Every day | ORAL | Status: DC

## 2014-01-26 MED ORDER — AMIODARONE HCL 200 MG PO TABS: 200.0000 mg | ORAL_TABLET | Freq: Every day | ORAL | Status: DC

## 2014-01-26 MED ORDER — DILTIAZEM HCL ER COATED BEADS 360 MG PO CP24: 360.0000 mg | ORAL_CAPSULE | Freq: Every day | ORAL | Status: DC

## 2014-01-26 MED ORDER — FUROSEMIDE 40 MG PO TABS: ORAL_TABLET | ORAL | Status: DC

## 2014-02-05 ENCOUNTER — Ambulatory Visit (INDEPENDENT_AMBULATORY_CARE_PROVIDER_SITE_OTHER): Payer: BC Managed Care – PPO | Admitting: Physician Assistant

## 2014-02-05 ENCOUNTER — Ambulatory Visit (HOSPITAL_COMMUNITY)
Admission: RE | Admit: 2014-02-05 | Discharge: 2014-02-05 | Disposition: A | Discharge status: Home / Self Care | Payer: BC Managed Care – PPO | Source: Ambulatory Visit | Attending: Cardiology | Admitting: Cardiology

## 2014-02-05 ENCOUNTER — Encounter: Payer: Self-pay | Admitting: Physician Assistant

## 2014-02-05 ENCOUNTER — Ambulatory Visit (HOSPITAL_COMMUNITY)
Admission: RE | Admit: 2014-02-05 | Discharge: 2014-02-05 | Disposition: A | Discharge status: Home / Self Care | Payer: BC Managed Care – PPO | Source: Ambulatory Visit | Attending: Physician Assistant | Admitting: Physician Assistant

## 2014-02-05 VITALS — BP 132/86 | HR 118 | Temp 99.6°F | Resp 20 | Ht 59.5 in | Wt 233.0 lb

## 2014-02-05 DIAGNOSIS — I5032 Chronic diastolic (congestive) heart failure: Secondary | ICD-10-CM | POA: Diagnosis present

## 2014-02-05 DIAGNOSIS — I5022 Chronic systolic (congestive) heart failure: Secondary | ICD-10-CM | POA: Diagnosis not present

## 2014-02-05 DIAGNOSIS — R05 Cough: Secondary | ICD-10-CM

## 2014-02-05 DIAGNOSIS — R059 Cough, unspecified: Secondary | ICD-10-CM

## 2014-02-05 DIAGNOSIS — I517 Cardiomegaly: Secondary | ICD-10-CM | POA: Diagnosis not present

## 2014-02-05 DIAGNOSIS — J449 Chronic obstructive pulmonary disease, unspecified: Secondary | ICD-10-CM | POA: Insufficient documentation

## 2014-02-05 DIAGNOSIS — R062 Wheezing: Secondary | ICD-10-CM | POA: Insufficient documentation

## 2014-02-05 DIAGNOSIS — R0602 Shortness of breath: Secondary | ICD-10-CM | POA: Diagnosis not present

## 2014-02-05 DIAGNOSIS — J209 Acute bronchitis, unspecified: Secondary | ICD-10-CM

## 2014-02-05 LAB — BASIC METABOLIC PANEL
Anion gap: 8 (ref 5–15)
BUN: 13 mg/dL (ref 6–23)
CO2: 31 mmol/L (ref 19–32)
Calcium: 8.7 mg/dL (ref 8.4–10.5)
Chloride: 104 mEq/L (ref 96–112)
Creatinine, Ser: 1.15 mg/dL — ABNORMAL HIGH (ref 0.50–1.10)
GFR calc Af Amer: 61 mL/min — ABNORMAL LOW (ref >90)
GFR calc non Af Amer: 53 mL/min — ABNORMAL LOW (ref >90)
Glucose, Bld: 104 mg/dL — ABNORMAL HIGH (ref 70–99)
Potassium: 4.1 mmol/L (ref 3.5–5.1)
Sodium: 143 mmol/L (ref 135–145)

## 2014-02-05 MED ORDER — AZITHROMYCIN 250 MG PO TABS: ORAL_TABLET | ORAL | Status: DC

## 2014-02-05 MED ORDER — IPRATROPIUM-ALBUTEROL 0.5-2.5 (3) MG/3ML IN SOLN
3.0000 mL | Freq: Once | RESPIRATORY_TRACT | Status: AC
  Administered 2014-02-05: 3 mL via RESPIRATORY_TRACT

## 2014-02-05 MED ORDER — BENZONATATE 100 MG PO CAPS: 200.0000 mg | ORAL_CAPSULE | Freq: Three times a day (TID) | ORAL | Status: DC | PRN

## 2014-02-05 MED ORDER — PREDNISONE 20 MG PO TABS: ORAL_TABLET | ORAL | Status: AC

## 2014-02-05 NOTE — Patient Instructions (Signed)
-Take Z-Pak as prescribed. -Take Tessalon Perles as prescribed for cough. -Take Prednisone as prescribed for inflammation. Continue Symbicort as prescribed. -Get chest x-ray, I will call you with results. Make sure you are drinking water to stay hydrated.  If you are not feeling better in 10-14 days, then please call the office. If the chest x-ray comes back with pneumonia, then please come back in 1 week.  Acute Bronchitis Bronchitis is inflammation of the airways that extend from the windpipe into the lungs (bronchi). The inflammation often causes mucus to develop. This leads to a cough, which is the most common symptom of bronchitis.  In acute bronchitis, the condition usually develops suddenly and goes away over time, usually in a couple weeks. Smoking, allergies, and asthma can make bronchitis worse. Repeated episodes of bronchitis may cause further lung problems.  CAUSES Acute bronchitis is most often caused by the same virus that causes a cold. The virus can spread from person to person (contagious) through coughing, sneezing, and touching contaminated objects. SIGNS AND SYMPTOMS   Cough.   Fever.   Coughing up mucus.   Body aches.   Chest congestion.   Chills.   Shortness of breath.   Sore throat.  DIAGNOSIS  Acute bronchitis is usually diagnosed through a physical exam. Your health care provider will also ask you questions about your medical history. Tests, such as chest X-rays, are sometimes done to rule out other conditions.  TREATMENT  Acute bronchitis usually goes away in a couple weeks. Oftentimes, no medical treatment is necessary. Medicines are sometimes given for relief of fever or cough. Antibiotic medicines are usually not needed but may be prescribed in certain situations. In some cases, an inhaler may be recommended to help reduce shortness of breath and control the cough. A cool mist vaporizer may also be used to help thin bronchial secretions and make  it easier to clear the chest.  HOME CARE INSTRUCTIONS  Get plenty of rest.   Drink enough fluids to keep your urine clear or pale yellow (unless you have a medical condition that requires fluid restriction). Increasing fluids may help thin your respiratory secretions (sputum) and reduce chest congestion, and it will prevent dehydration.   Take medicines only as directed by your health care provider.  If you were prescribed an antibiotic medicine, finish it all even if you start to feel better.  Avoid smoking and secondhand smoke. Exposure to cigarette smoke or irritating chemicals will make bronchitis worse. If you are a smoker, consider using nicotine gum or skin patches to help control withdrawal symptoms. Quitting smoking will help your lungs heal faster.   Reduce the chances of another bout of acute bronchitis by washing your hands frequently, avoiding people with cold symptoms, and trying not to touch your hands to your mouth, nose, or eyes.   Keep all follow-up visits as directed by your health care provider.  SEEK MEDICAL CARE IF: Your symptoms do not improve after 1 week of treatment.  SEEK IMMEDIATE MEDICAL CARE IF:  You develop an increased fever or chills.   You have chest pain.   You have severe shortness of breath.  You have bloody sputum.   You develop dehydration.  You faint or repeatedly feel like you are going to pass out.  You develop repeated vomiting.  You develop a severe headache. MAKE SURE YOU:   Understand these instructions.  Will watch your condition.  Will get help right away if you are not doing well or  get worse. Document Released: 03/04/2004 Document Revised: 06/11/2013 Document Reviewed: 07/18/2012 [facility] Patient Information 2015 [place], [place]. This information is not intended to replace advice given to you by your health care provider. Make sure you discuss any questions you have with your health care provider.

## 2014-02-05 NOTE — Progress Notes (Signed)
Subjective:    Patient ID: [name], female    DOB: 23-Sep-1960, 53 y.o.   MRN: [id number]  Wheezing  This is a new problem. Episode onset: Last Saturday. The problem occurs constantly. The problem has been unchanged. Associated symptoms include coughing, a fever, rhinorrhea and shortness of breath. Pertinent negatives include no chills, ear pain, rash or sore throat. She has tried nothing for the symptoms.  Cough This is a new problem. Episode onset: Last Saturday. The problem has been unchanged. The problem occurs constantly. The cough is non-productive. Associated symptoms include a fever, postnasal drip, rhinorrhea, shortness of breath and wheezing. Pertinent negatives include no chills, ear pain, rash or sore throat. Risk factors for lung disease include smoking/tobacco exposure (Former Smoker).  Shortness of Breath Associated symptoms include a fever, rhinorrhea and wheezing. Pertinent negatives include no ear pain, rash or sore throat.  Former Smoker- Quit 09/08/2009- 2 ppd for 35 years GFR= 61 on 02/05/14 Review of Systems  Constitutional: Positive for fever. Negative for chills, diaphoresis and fatigue.  HENT: Positive for congestion, postnasal drip and rhinorrhea. Negative for ear discharge, ear pain, sinus pressure, sore throat and trouble swallowing.   Eyes: Negative.   Respiratory: Positive for cough, chest tightness, shortness of breath and wheezing.   Cardiovascular: Negative.   Gastrointestinal: Negative.   Genitourinary: Negative.   Musculoskeletal: Negative.   Skin: Negative.  Negative for rash.  Neurological: Negative.    Past Medical History  Diagnosis Date  . GERD (gastroesophageal reflux disease)   . Depression   . Persistent atrial fibrillation     Myoview 4/09: No ischemia;  echo 4/09: EF 60-65%;   Flecainide, beta blocker, Coumadin;    Unable to take Tikosyn 2/2 prolonged QT  . Obstructive sleep apnea   . Borderline diabetes   . Atrial flutter   . Chronic  diastolic heart failure   . Pulmonary embolism     Right lower lobe diagnosed by CT 6/12  . COPD (chronic obstructive pulmonary disease)   . Hyperlipidemia   . HTN (hypertension)   . Type II or unspecified type diabetes mellitus without mention of complication, not stated as uncontrolled   . Anxiety   . Anemia    Current Outpatient Prescriptions on File Prior to Visit  Medication Sig Dispense Refill  . ALPRAZolam (XANAX) 0.5 MG tablet 1/2-1 pill PRN before procedure, if need to can take 1 at one time. Do not drive if taking. 30 tablet 0  . aspirin-acetaminophen-caffeine (EXCEDRIN MIGRAINE) 563-875-64 MG per tablet Take 1 tablet by mouth every 6 (six) hours as needed for headache.    . budesonide-formoterol (SYMBICORT) 160-4.5 MCG/ACT inhaler Inhale 1 puff into the lungs daily. 3 Inhaler 4  . furosemide (LASIX) 40 MG tablet Take 40 mg (1 tablet) in the morning and 20 mg (1/2 tablet) in the evening. 140 tablet 3  . levothyroxine (SYNTHROID, LEVOTHROID) 100 MCG tablet 1 pill daily 30-60 mins before food 90 tablet 4  . metFORMIN (GLUCOPHAGE XR) 500 MG 24 hr tablet Take 1 or 2 tablets once or twice daily as directed for Diabetes 360 tablet 4  . OVER THE COUNTER MEDICATION as needed. Equate Night time sleep aid    . potassium chloride (K-DUR) 10 MEQ tablet Take 4 tablets (40 mEq total) by mouth daily. 360 tablet 3  . rivaroxaban (XARELTO) 20 MG TABS tablet Take 1 tablet (20 mg total) by mouth daily. 30 tablet 3  . sertraline (ZOLOFT) 100 MG tablet Take 1  tablet (100 mg total) by mouth daily. (Patient taking differently: Take 50 mg by mouth daily. ) 90 tablet 4  . amiodarone (PACERONE) 200 MG tablet Take 1 tablet (200 mg total) by mouth daily. (Patient not taking: Reported on 02/05/2014) 90 tablet 3  . diltiazem (CARDIZEM CD) 360 MG 24 hr capsule Take 1 capsule (360 mg total) by mouth daily. (Patient not taking: Reported on 02/05/2014) 90 capsule 3   No current facility-administered medications on  file prior to visit.   Allergies  Allergen Reactions  . Sulfonamide Derivatives Hives and Itching  . Tikosyn [Dofetilide]     Prolonged qt     BP 132/86 mmHg  Pulse 118  Temp(Src) 99.6 F (37.6 C) (Temporal)  Resp 20  Ht 4' 11.5" (1.511 m)  Wt 233 lb (105.688 kg)  BMI 46.29 kg/m2  SpO2 90% SpO2 after DuoNeb was 94%. Wt Readings from Last 3 Encounters:  02/05/14 233 lb (105.688 kg)  01/21/14 240 lb 8 oz (109.09 kg)  12/24/13 228 lb (103.42 kg)   Objective:   Physical Exam  Constitutional: She is oriented to person, place, and time. She appears well-developed and well-nourished. She has a sickly appearance. No distress.  HENT:  Head: Normocephalic.  Right Ear: Tympanic membrane, external ear and ear canal normal.  Left Ear: Tympanic membrane, external ear and ear canal normal.  Nose: Nose normal. Right sinus exhibits no maxillary sinus tenderness and no frontal sinus tenderness. Left sinus exhibits no maxillary sinus tenderness and no frontal sinus tenderness.  Mouth/Throat: Uvula is midline and mucous membranes are normal. Mucous membranes are not pale and not dry. No trismus in the jaw. No uvula swelling. Posterior oropharyngeal erythema present. No oropharyngeal exudate, posterior oropharyngeal edema or tonsillar abscesses.  Eyes: Conjunctivae and lids are normal. Pupils are equal, round, and reactive to light. Right eye exhibits no discharge. Left eye exhibits no discharge. No scleral icterus.  Neck: Trachea normal, normal range of motion and phonation normal. Neck supple. No tracheal tenderness present. No tracheal deviation present.  Cardiovascular: Normal rate, S1 normal, S2 normal, normal heart sounds, intact distal pulses and normal pulses.  An irregularly irregular rhythm present. Exam reveals no gallop, no distant heart sounds and no friction rub.   No murmur heard. Pulmonary/Chest: Effort normal. No stridor. No respiratory distress. She has no decreased breath sounds. She  has wheezes. She has no rhonchi. She has no rales. She exhibits no tenderness.  Wheezing in all lung fields  Abdominal: Soft. Bowel sounds are normal. There is no tenderness. There is no rebound and no guarding.  Lymphadenopathy:  No tenderness or LAD.  Neurological: She is alert and oriented to person, place, and time. Gait normal.  Skin: Skin is warm, dry and intact. No rash noted. She is not diaphoretic. No pallor.  Psychiatric: She has a normal mood and affect. Her speech is normal and behavior is normal. Judgment and thought content normal. Cognition and memory are normal.  Vitals reviewed.  Assessment & Plan:  1. Acute bronchitis, unspecified organism -Take Z-Pak as prescribed- azithromycin (ZITHROMAX) 250 MG tablet; Take 2 tablets PO on day 1, then 1 tablet PO Q24H x 4 days  Dispense: 6 tablet; Refill: 1.  Patient states she has taken Z-Pak before while on amiodarone and denies any complications or side effects. -Take Prednisone as prescribed for inflammation- predniSONE (DELTASONE) 20 MG tablet; Take 3 tablets PO QDaily for 3 days, then take 2 tablets PO QDaily for 3 days, then take  1 tablet PO QDaily for 3 days  Dispense: 18 tablet; Refill: 0 - Take Tessalon Perles as prescribed for cough-benzonatate (TESSALON PERLES) 100 MG capsule; Take 2 capsules (200 mg total) by mouth 3 (three) times daily as needed for cough (Max: 656m per day (6 capsules per day)).  Dispense: 120 capsule; Refill: 0 -Gave DuoNeb in office - ipratropium-albuterol (DUONEB) 0.5-2.5 (3) MG/3ML nebulizer solution 3 mL; Take 3 mLs by nebulization once. -Continue Symbicort as prescribed.  2. Cough Ordered chest x-ray to R/O Pneumonia - DG Chest 2 View; Future  Discussed medication effects and SE's.  Pt agreed to treatment plan. If you are not feeling better in 10-14 days, then please call the office. Please keep your follow up appt on 04/01/14.  Addendum: Chest x-ray showed: IMPRESSION: Mild right middle lobe  opacity, suspicious for pneumonia. Recommend chest radiographic followup in several weeks to confirm resolution. Stable cardiomegaly. Please continue with current medications given at visit.  Please follow up in 1 week.   [name], JStephani Police PA-C 9:24 PM [facility]

## 2014-02-12 ENCOUNTER — Ambulatory Visit (INDEPENDENT_AMBULATORY_CARE_PROVIDER_SITE_OTHER): Payer: BLUE CROSS/BLUE SHIELD | Admitting: Physician Assistant

## 2014-02-12 ENCOUNTER — Encounter: Payer: Self-pay | Admitting: Physician Assistant

## 2014-02-12 VITALS — BP 128/74 | HR 94 | Temp 98.2°F | Resp 18 | Wt 230.0 lb

## 2014-02-12 DIAGNOSIS — J189 Pneumonia, unspecified organism: Secondary | ICD-10-CM

## 2014-02-12 MED ORDER — DOXYCYCLINE HYCLATE 100 MG PO TABS: 100.0000 mg | ORAL_TABLET | Freq: Two times a day (BID) | ORAL | Status: DC

## 2014-02-12 NOTE — Progress Notes (Signed)
Subjective:    Patient ID: [name], female    DOB: May 18, 1960, 54 y.o.   MRN: [id number]  HPI Comments: A Caucasian 54yo female presents to the office today for follow up for pneumonia.  Patient was last seen on 02/05/14.  Patient's chest x-ray showed "IMPRESSION: Mild right middle lobe opacity, suspicious for pneumonia. Recommend chest radiographic followup in several weeks to confirm resolution.  Stable cardiomegaly."  Patient was prescribed 2 Z-Paks, prednisone and tessalon perles.  Patient states she is feeling better, but still fatigued.  Patient would like to go back to work this week; however, I told patient that if she is fatigued and SOB, then she needs to go home and rest.  Patient states she only took 1 Z-Pak, has a couple days left on Prednisone and is taking tessalon perles for cough.  Patient states she does sleep with oxygen at 2 liters.  Former Smoker- Quit 09/08/2009- 2 ppd for 35 years GFR= 61 on 02/05/14 Review of Systems  Constitutional: Positive for fatigue. Negative for fever, chills and diaphoresis.  HENT: Positive for congestion and rhinorrhea. Negative for ear discharge, ear pain, postnasal drip, sinus pressure, sore throat, trouble swallowing and voice change.   Eyes: Negative.   Respiratory: Positive for cough, chest tightness, shortness of breath and wheezing.   Cardiovascular: Negative.   Gastrointestinal: Negative.   Genitourinary: Negative.   Musculoskeletal: Negative.   Skin: Negative.  Negative for rash.  Neurological: Negative.   Psychiatric/Behavioral: Negative.    Past Medical History  Diagnosis Date  . GERD (gastroesophageal reflux disease)   . Depression   . Persistent atrial fibrillation     Myoview 4/09: No ischemia;  echo 4/09: EF 60-65%;   Flecainide, beta blocker, Coumadin;    Unable to take Tikosyn 2/2 prolonged QT  . Obstructive sleep apnea   . Borderline diabetes   . Atrial flutter   . Chronic diastolic heart failure   . Pulmonary  embolism     Right lower lobe diagnosed by CT 6/12  . COPD (chronic obstructive pulmonary disease)   . Hyperlipidemia   . HTN (hypertension)   . Type II or unspecified type diabetes mellitus without mention of complication, not stated as uncontrolled   . Anxiety   . Anemia    Current Outpatient Prescriptions on File Prior to Visit  Medication Sig Dispense Refill  . ALPRAZolam (XANAX) 0.5 MG tablet 1/2-1 pill PRN before procedure, if need to can take 1 at one time. Do not drive if taking. 30 tablet 0  . amiodarone (PACERONE) 200 MG tablet Take 1 tablet (200 mg total) by mouth daily. 90 tablet 3  . aspirin-acetaminophen-caffeine (EXCEDRIN MIGRAINE) 735-329-92 MG per tablet Take 1 tablet by mouth every 6 (six) hours as needed for headache.    . benzonatate (TESSALON PERLES) 100 MG capsule Take 2 capsules (200 mg total) by mouth 3 (three) times daily as needed for cough (Max: 63m per day (6 capsules per day)). 120 capsule 0  . budesonide-formoterol (SYMBICORT) 160-4.5 MCG/ACT inhaler Inhale 1 puff into the lungs daily. 3 Inhaler 4  . diltiazem (CARDIZEM CD) 360 MG 24 hr capsule Take 1 capsule (360 mg total) by mouth daily. 90 capsule 3  . furosemide (LASIX) 40 MG tablet Take 40 mg (1 tablet) in the morning and 20 mg (1/2 tablet) in the evening. 140 tablet 3  . levothyroxine (SYNTHROID, LEVOTHROID) 100 MCG tablet 1 pill daily 30-60 mins before food 90 tablet 4  . metFORMIN (GLUCOPHAGE  XR) 500 MG 24 hr tablet Take 1 or 2 tablets once or twice daily as directed for Diabetes 360 tablet 4  . OVER THE COUNTER MEDICATION as needed. Equate Night time sleep aid    . potassium chloride (K-DUR) 10 MEQ tablet Take 4 tablets (40 mEq total) by mouth daily. 360 tablet 3  . predniSONE (DELTASONE) 20 MG tablet Take 3 tablets PO QDaily for 3 days, then take 2 tablets PO QDaily for 3 days, then take 1 tablet PO QDaily for 3 days 18 tablet 0  . rivaroxaban (XARELTO) 20 MG TABS tablet Take 1 tablet (20 mg total) by  mouth daily. 30 tablet 3  . sertraline (ZOLOFT) 100 MG tablet Take 1 tablet (100 mg total) by mouth daily. (Patient taking differently: Take 50 mg by mouth daily. ) 90 tablet 4   No current facility-administered medications on file prior to visit.   Allergies  Allergen Reactions  . Sulfonamide Derivatives Hives and Itching  . Tikosyn [Dofetilide]     Prolonged qt     BP 128/74 mmHg  Pulse 94  Temp(Src) 98.2 F (36.8 C) (Temporal)  Resp 18  Wt 230 lb (104.327 kg)  SpO2 93% SpO2 recheck 94% Wt Readings from Last 3 Encounters:  02/12/14 230 lb (104.327 kg)  02/05/14 233 lb (105.688 kg)  01/21/14 240 lb 8 oz (109.09 kg)   Objective:   Physical Exam  Constitutional: She is oriented to person, place, and time. She appears well-developed and well-nourished. She has a sickly appearance. No distress.  HENT:  Head: Normocephalic.  Right Ear: Tympanic membrane, external ear and ear canal normal.  Left Ear: Tympanic membrane, external ear and ear canal normal.  Nose: Nose normal. Right sinus exhibits no maxillary sinus tenderness and no frontal sinus tenderness. Left sinus exhibits no maxillary sinus tenderness and no frontal sinus tenderness.  Mouth/Throat: Uvula is midline and mucous membranes are normal. Mucous membranes are not pale and not dry. No trismus in the jaw. No uvula swelling. Posterior oropharyngeal erythema present. No oropharyngeal exudate, posterior oropharyngeal edema or tonsillar abscesses.  Eyes: Conjunctivae, EOM and lids are normal. Pupils are equal, round, and reactive to light. Right eye exhibits no discharge. Left eye exhibits no discharge. No scleral icterus.  Neck: Trachea normal, normal range of motion and phonation normal. Neck supple. No tracheal tenderness present. No tracheal deviation present.  Cardiovascular: Normal rate, S1 normal, S2 normal, normal heart sounds, intact distal pulses and normal pulses.  An irregularly irregular rhythm present. Exam reveals no  gallop, no distant heart sounds and no friction rub.   No murmur heard. Pulmonary/Chest: Effort normal. No stridor. No respiratory distress. She has decreased breath sounds. She has wheezes. She has no rhonchi. She has no rales. She exhibits no tenderness.  Mild expiratory wheezing in all lung fields.  Some decreased breath sounds in right lung.  Abdominal: Soft. Bowel sounds are normal. There is no tenderness. There is no rebound and no guarding.  Lymphadenopathy:  No tenderness or LAD.  Neurological: She is alert and oriented to person, place, and time. No cranial nerve deficit. Gait normal.  Skin: Skin is warm, dry and intact. No rash noted. She is not diaphoretic. No pallor.  Psychiatric: She has a normal mood and affect. Her speech is normal and behavior is normal. Judgment and thought content normal. Cognition and memory are normal.  Vitals reviewed.  Assessment & Plan:  1. CAP (community acquired pneumonia) -Start taking Doxycycline as prescribed with food-  doxycycline (VIBRA-TABS) 100 MG tablet; Take 1 tablet (100 mg total) by mouth 2 (two) times daily. For 7 days with food  Dispense: 14 tablet; Refill: 0 - DG Chest 2 View; Future- Ordered chest x-ray due to radiology recommending follow up after treatment.  Told patient to go get chest x-ray on 03/05/14 at [facility].  Discussed medication effects and SE's.  Pt agreed to treatment plan. Please follow up in 1 week.  Cullen Lahaie, Stephani Police, PA-C 4:23 PM [facility]

## 2014-02-12 NOTE — Patient Instructions (Addendum)
- Please get chest x-ray on 03/05/14.  Take Doxycycline as prescribed with food. Please make sure you are drinking fluids to stay hydrated.  Please follow up in 1 week.   Pneumonia Pneumonia is an infection of the lungs.  CAUSES Pneumonia may be caused by bacteria or a virus. Usually, these infections are caused by breathing infectious particles into the lungs (respiratory tract). SIGNS AND SYMPTOMS   Cough.  Fever.  Chest pain.  Increased rate of breathing.  Wheezing.  Mucus production. DIAGNOSIS  If you have the common symptoms of pneumonia, your health care provider will typically confirm the diagnosis with a chest X-ray. The X-ray will show an abnormality in the lung (pulmonary infiltrate) if you have pneumonia. Other tests of your blood, urine, or sputum may be done to find the specific cause of your pneumonia. Your health care provider may also do tests (blood gases or pulse oximetry) to see how well your lungs are working. TREATMENT  Some forms of pneumonia may be spread to other people when you cough or sneeze. You may be asked to wear a mask before and during your exam. Pneumonia that is caused by bacteria is treated with antibiotic medicine. Pneumonia that is caused by the influenza virus may be treated with an antiviral medicine. Most other viral infections must run their course. These infections will not respond to antibiotics.  HOME CARE INSTRUCTIONS   Cough suppressants may be used if you are losing too much rest. However, coughing protects you by clearing your lungs. You should avoid using cough suppressants if you can.  Your health care provider may have prescribed medicine if he or she thinks your pneumonia is caused by bacteria or influenza. Finish your medicine even if you start to feel better.  Your health care provider may also prescribe an expectorant. This loosens the mucus to be coughed up.  Take medicines only as directed by your health care  provider.  Do not smoke. Smoking is a common cause of bronchitis and can contribute to pneumonia. If you are a smoker and continue to smoke, your cough may last several weeks after your pneumonia has cleared.  A cold steam vaporizer or humidifier in your room or home may help loosen mucus.  Coughing is often worse at night. Sleeping in a semi-upright position in a recliner or using a couple pillows under your head will help with this.  Get rest as you feel it is needed. Your body will usually let you know when you need to rest. PREVENTION A pneumococcal shot (vaccine) is available to prevent a common bacterial cause of pneumonia. This is usually suggested for:  People over 2 years old.  Patients on chemotherapy.  People with chronic lung problems, such as bronchitis or emphysema.  People with immune system problems. If you are over 65 or have a high risk condition, you may receive the pneumococcal vaccine if you have not received it before. In some countries, a routine influenza vaccine is also recommended. This vaccine can help prevent some cases of pneumonia.You may be offered the influenza vaccine as part of your care. If you smoke, it is time to quit. You may receive instructions on how to stop smoking. Your health care provider can provide medicines and counseling to help you quit. SEEK MEDICAL CARE IF: You have a fever. SEEK IMMEDIATE MEDICAL CARE IF:   Your illness becomes worse. This is especially true if you are elderly or weakened from any other disease.  You cannot  control your cough with suppressants and are losing sleep.  You begin coughing up blood.  You develop pain which is getting worse or is uncontrolled with medicines.  Any of the symptoms which initially brought you in for treatment are getting worse rather than better.  You develop shortness of breath or chest pain. MAKE SURE YOU:   Understand these instructions.  Will watch your condition.  Will get  help right away if you are not doing well or get worse. Document Released: 01/25/2005 Document Revised: 06/11/2013 Document Reviewed: 04/16/2010 [facility] Patient Information 2015 [place], [place]. This information is not intended to replace advice given to you by your health care provider. Make sure you discuss any questions you have with your health care provider.

## 2014-02-19 ENCOUNTER — Encounter: Payer: Self-pay | Admitting: Physician Assistant

## 2014-02-19 ENCOUNTER — Ambulatory Visit (INDEPENDENT_AMBULATORY_CARE_PROVIDER_SITE_OTHER): Payer: BLUE CROSS/BLUE SHIELD | Admitting: Physician Assistant

## 2014-02-19 VITALS — BP 122/68 | HR 84 | Temp 98.2°F | Resp 18 | Ht 59.5 in | Wt 232.0 lb

## 2014-02-19 DIAGNOSIS — J189 Pneumonia, unspecified organism: Secondary | ICD-10-CM

## 2014-02-19 NOTE — Patient Instructions (Addendum)
-Please go to [facility] on 03/05/14 to get repeat chest x-ray to make sure pneumonia is gone.   Please make sure you are drinking water to stay hydrated.   Get oxygen saturation pulse oximeter and see if oxygen is at 94%.  If below and with symptoms, then come in for visit.  Please keep your follow up on 04/01/14.   Pneumonia Pneumonia is an infection of the lungs.  CAUSES Pneumonia may be caused by bacteria or a virus. Usually, these infections are caused by breathing infectious particles into the lungs (respiratory tract). SIGNS AND SYMPTOMS   Cough.  Fever.  Chest pain.  Increased rate of breathing.  Wheezing.  Mucus production. DIAGNOSIS  If you have the common symptoms of pneumonia, your health care provider will typically confirm the diagnosis with a chest X-ray. The X-ray will show an abnormality in the lung (pulmonary infiltrate) if you have pneumonia. Other tests of your blood, urine, or sputum may be done to find the specific cause of your pneumonia. Your health care provider may also do tests (blood gases or pulse oximetry) to see how well your lungs are working. TREATMENT  Some forms of pneumonia may be spread to other people when you cough or sneeze. You may be asked to wear a mask before and during your exam. Pneumonia that is caused by bacteria is treated with antibiotic medicine. Pneumonia that is caused by the influenza virus may be treated with an antiviral medicine. Most other viral infections must run their course. These infections will not respond to antibiotics.  HOME CARE INSTRUCTIONS   Cough suppressants may be used if you are losing too much rest. However, coughing protects you by clearing your lungs. You should avoid using cough suppressants if you can.  Your health care provider may have prescribed medicine if he or she thinks your pneumonia is caused by bacteria or influenza. Finish your medicine even if you start to feel better.  Your health care  provider may also prescribe an expectorant. This loosens the mucus to be coughed up.  Take medicines only as directed by your health care provider.  Do not smoke. Smoking is a common cause of bronchitis and can contribute to pneumonia. If you are a smoker and continue to smoke, your cough may last several weeks after your pneumonia has cleared.  A cold steam vaporizer or humidifier in your room or home may help loosen mucus.  Coughing is often worse at night. Sleeping in a semi-upright position in a recliner or using a couple pillows under your head will help with this.  Get rest as you feel it is needed. Your body will usually let you know when you need to rest. PREVENTION A pneumococcal shot (vaccine) is available to prevent a common bacterial cause of pneumonia. This is usually suggested for:  People over 63 years old.  Patients on chemotherapy.  People with chronic lung problems, such as bronchitis or emphysema.  People with immune system problems. If you are over 65 or have a high risk condition, you may receive the pneumococcal vaccine if you have not received it before. In some countries, a routine influenza vaccine is also recommended. This vaccine can help prevent some cases of pneumonia.You may be offered the influenza vaccine as part of your care. If you smoke, it is time to quit. You may receive instructions on how to stop smoking. Your health care provider can provide medicines and counseling to help you quit. SEEK MEDICAL CARE IF:  You have a fever. SEEK IMMEDIATE MEDICAL CARE IF:   Your illness becomes worse. This is especially true if you are elderly or weakened from any other disease.  You cannot control your cough with suppressants and are losing sleep.  You begin coughing up blood.  You develop pain which is getting worse or is uncontrolled with medicines.  Any of the symptoms which initially brought you in for treatment are getting worse rather than  better.  You develop shortness of breath or chest pain. MAKE SURE YOU:   Understand these instructions.  Will watch your condition.  Will get help right away if you are not doing well or get worse. Document Released: 01/25/2005 Document Revised: 06/11/2013 Document Reviewed: 04/16/2010 [facility] Patient Information 2015 [place], [place]. This information is not intended to replace advice given to you by your health care provider. Make sure you discuss any questions you have with your health care provider.

## 2014-02-19 NOTE — Progress Notes (Signed)
Patient ID: [name], female   DOB: 06/09/1960, 54 y.o.   MRN: [id number]  Subjective:    Patient ID: [name], female    DOB: 01-26-1961, 54 y.o.   MRN: [id number]  HPI Comments: A Caucasian 54yo female presents to the office today for follow up for pneumonia.  Patient was last seen on 02/12/14.  Patient's chest x-ray (02/05/14) showed "Mild right middle lobe opacity, suspicious for pneumonia. Recommend chest radiographic followup in several weeks to confirm resolution.  Stable cardiomegaly."  Patient was given doxycyline at last visit.  Before that patient was prescribed 2 Z-Paks, prednisone and tessalon perles.  Patient only took 1 Z-Pak and still felt fatigue and sick, so that is why she was prescribed doxycycline.  Patient states she is feeling much better.  Patient states she does sleep with oxygen at 2 liters.  Former Smoker- Quit 09/08/2009- 2 ppd for 35 years GFR= 61 on 02/05/14 Review of Systems  Constitutional: Negative.  Negative for fever, chills, diaphoresis and fatigue.  HENT: Negative.  Negative for congestion, ear discharge, ear pain, postnasal drip, rhinorrhea, sinus pressure, sore throat, trouble swallowing and voice change.   Eyes: Negative.   Respiratory: Negative.  Negative for cough, chest tightness, shortness of breath and wheezing.   Cardiovascular: Negative.  Negative for chest pain.  Gastrointestinal: Negative.   Musculoskeletal: Negative.   Skin: Negative.   Neurological: Negative.   Psychiatric/Behavioral: Negative.    Past Medical History  Diagnosis Date  . GERD (gastroesophageal reflux disease)   . Depression   . Persistent atrial fibrillation     Myoview 4/09: No ischemia;  echo 4/09: EF 60-65%;   Flecainide, beta blocker, Coumadin;    Unable to take Tikosyn 2/2 prolonged QT  . Obstructive sleep apnea   . Borderline diabetes   . Atrial flutter   . Chronic diastolic heart failure   . Pulmonary embolism     Right lower lobe diagnosed by CT 6/12  .  COPD (chronic obstructive pulmonary disease)   . Hyperlipidemia   . HTN (hypertension)   . Type II or unspecified type diabetes mellitus without mention of complication, not stated as uncontrolled   . Anxiety   . Anemia    Current Outpatient Prescriptions on File Prior to Visit  Medication Sig Dispense Refill  . ALPRAZolam (XANAX) 0.5 MG tablet 1/2-1 pill PRN before procedure, if need to can take 1 at one time. Do not drive if taking. 30 tablet 0  . amiodarone (PACERONE) 200 MG tablet Take 1 tablet (200 mg total) by mouth daily. 90 tablet 3  . aspirin-acetaminophen-caffeine (EXCEDRIN MIGRAINE) 468-032-12 MG per tablet Take 1 tablet by mouth every 6 (six) hours as needed for headache.    . budesonide-formoterol (SYMBICORT) 160-4.5 MCG/ACT inhaler Inhale 1 puff into the lungs daily. 3 Inhaler 4  . diltiazem (CARDIZEM CD) 360 MG 24 hr capsule Take 1 capsule (360 mg total) by mouth daily. 90 capsule 3  . doxycycline (VIBRA-TABS) 100 MG tablet Take 1 tablet (100 mg total) by mouth 2 (two) times daily. For 7 days with food 14 tablet 0  . furosemide (LASIX) 40 MG tablet Take 40 mg (1 tablet) in the morning and 20 mg (1/2 tablet) in the evening. 140 tablet 3  . levothyroxine (SYNTHROID, LEVOTHROID) 100 MCG tablet 1 pill daily 30-60 mins before food 90 tablet 4  . metFORMIN (GLUCOPHAGE XR) 500 MG 24 hr tablet Take 1 or 2 tablets once or twice daily as directed  for Diabetes 360 tablet 4  . OVER THE COUNTER MEDICATION as needed. Equate Night time sleep aid    . potassium chloride (K-DUR) 10 MEQ tablet Take 4 tablets (40 mEq total) by mouth daily. 360 tablet 3  . rivaroxaban (XARELTO) 20 MG TABS tablet Take 1 tablet (20 mg total) by mouth daily. 30 tablet 3  . sertraline (ZOLOFT) 100 MG tablet Take 1 tablet (100 mg total) by mouth daily. (Patient taking differently: Take 50 mg by mouth daily. ) 90 tablet 4   No current facility-administered medications on file prior to visit.   Allergies  Allergen  Reactions  . Sulfonamide Derivatives Hives and Itching  . Tikosyn [Dofetilide]     Prolonged qt     BP 122/68 mmHg  Pulse 84  Temp(Src) 98.2 F (36.8 C) (Temporal)  Resp 18  Ht 4' 11.5" (1.511 m)  Wt 232 lb (105.235 kg)  BMI 46.09 kg/m2  SpO2 94%  Wt Readings from Last 3 Encounters:  02/19/14 232 lb (105.235 kg)  02/12/14 230 lb (104.327 kg)  02/05/14 233 lb (105.688 kg)   Objective:   Physical Exam  Constitutional: She is oriented to person, place, and time. She appears well-developed and well-nourished. She does not have a sickly appearance. No distress.  HENT:  Head: Normocephalic.  Eyes: Conjunctivae, EOM and lids are normal. Pupils are equal, round, and reactive to light. Right eye exhibits no discharge. Left eye exhibits no discharge. No scleral icterus.  Neck: Phonation normal.  Cardiovascular: Normal rate, S1 normal, S2 normal, normal heart sounds, intact distal pulses and normal pulses.  An irregularly irregular rhythm present. Exam reveals no gallop, no distant heart sounds and no friction rub.   No murmur heard. Pulmonary/Chest: Effort normal. No respiratory distress. She has no decreased breath sounds. She has no wheezes. She has no rhonchi. She has no rales. She exhibits no tenderness.  Abdominal: Soft. Bowel sounds are normal.  Neurological: She is alert and oriented to person, place, and time. No cranial nerve deficit. Gait normal.  Skin: Skin is warm, dry and intact. No rash noted. She is not diaphoretic. No pallor.  Psychiatric: She has a normal mood and affect. Her speech is normal and behavior is normal. Judgment and thought content normal. Cognition and memory are normal.  Vitals reviewed.  Assessment & Plan:  1. CAP (community acquired pneumonia) -Resolved and feeling better. - DG Chest 2 View; Future- Ordered chest x-ray due to radiology recommending follow up after treatment.  Told patient to go get chest x-ray on 03/05/14 at [facility].  Discussed  medication effects and SE's.  Pt agreed to treatment plan. Please keep your follow up appt on 04/01/14.  Dannie Hattabaugh, Stephani Police, PA-C 4:51 PM [facility]

## 2014-02-21 ENCOUNTER — Encounter (HOSPITAL_COMMUNITY): Payer: Self-pay

## 2014-02-21 ENCOUNTER — Ambulatory Visit (HOSPITAL_COMMUNITY)
Admission: RE | Admit: 2014-02-21 | Discharge: 2014-02-21 | Disposition: A | Discharge status: Home / Self Care | Payer: BLUE CROSS/BLUE SHIELD | Source: Ambulatory Visit | Attending: Internal Medicine | Admitting: Internal Medicine

## 2014-02-21 VITALS — BP 110/68 | HR 88 | Wt 231.0 lb

## 2014-02-21 DIAGNOSIS — E669 Obesity, unspecified: Secondary | ICD-10-CM | POA: Insufficient documentation

## 2014-02-21 DIAGNOSIS — Z9119 Patient's noncompliance with other medical treatment and regimen: Secondary | ICD-10-CM | POA: Insufficient documentation

## 2014-02-21 DIAGNOSIS — E039 Hypothyroidism, unspecified: Secondary | ICD-10-CM | POA: Insufficient documentation

## 2014-02-21 DIAGNOSIS — R0902 Hypoxemia: Secondary | ICD-10-CM | POA: Diagnosis not present

## 2014-02-21 DIAGNOSIS — E785 Hyperlipidemia, unspecified: Secondary | ICD-10-CM | POA: Diagnosis not present

## 2014-02-21 DIAGNOSIS — Z7901 Long term (current) use of anticoagulants: Secondary | ICD-10-CM | POA: Diagnosis not present

## 2014-02-21 DIAGNOSIS — Z91199 Patient's noncompliance with other medical treatment and regimen due to unspecified reason: Secondary | ICD-10-CM

## 2014-02-21 DIAGNOSIS — I5032 Chronic diastolic (congestive) heart failure: Secondary | ICD-10-CM | POA: Insufficient documentation

## 2014-02-21 DIAGNOSIS — E119 Type 2 diabetes mellitus without complications: Secondary | ICD-10-CM | POA: Diagnosis not present

## 2014-02-21 DIAGNOSIS — I482 Chronic atrial fibrillation, unspecified: Secondary | ICD-10-CM

## 2014-02-21 DIAGNOSIS — E038 Other specified hypothyroidism: Secondary | ICD-10-CM | POA: Diagnosis not present

## 2014-02-21 DIAGNOSIS — I159 Secondary hypertension, unspecified: Secondary | ICD-10-CM

## 2014-02-21 NOTE — Progress Notes (Signed)
Patient ID: [name], female   DOB: August 26, 1960, 54 y.o.   MRN: [id number]  PCP:  Dr. Idell Pickles  Primary Cardiologist:  Dr. [name] Pulmonologist: Dr Gwenette Greet EP: Dr Rayann Heman  History of Present Illness: [name] is a 54 y.o. female with a history of chronic diastolic heart failure, HTN, hyperlipidemia, sleep apnea, glucose intolerance, COPD and GERD as well as paroxysmal A fib.     She was admitted 6/2-6/11/2010.  Prior to admission, she had stopped all of her medications due to difficulties with finances.  She had been developing increasing dyspnea requiring several rounds of antibiotics and prednisone taper.  She presented to the hospital with narrow complex tachycardia treated with adenosine x2.  She developed normal sinus rhythm briefly and then converted to atrial flutter with rapid ventricular rate.  Myocardial infarction was ruled out.  She continued to have intermittent episodes of atrial flutter.  She was placed on flecainide.   She was seen by EP.  Given her multiple atrial arrhythmias, ablation therapy was not felt to be helpful and she was continued on flecainide.  She had continued dyspnea and CT scan demonstrated a right lower lobe nonocclusive pulmonary embolism.  She was placed on heparin and Coumadin.    Admitted 01/14/12 with afib with RVR (NSR 09/23/10) and volume overload.  During hospitalization Underwent TEE/DCCV and DCCV  which showed no thrombus.  She failed 2 cardioversions therefore rate control was pursued. Flecainide was stopped and transitioned to cardizem and amiodarone. She was also placed on Xarelto. Discharge weight 215 pounds.  Follow up for Heart Failure: Since the last visit she was treated for pneumonia by PCP.  Overall feeling good. Much improved. Able walk around the grocery store slowly. Denies PND/Orthopnea. DOE with minimal exertion about 25 ft. Denies dizziness.Weight at home 232 pounds.  Eating high salt foods again.  Drinking > 2 liters per  day.  Able to go upstairs but has mild dyspnea.  Taking all medications. No bleeding problems.   Labs  07/31/12 TSH 6.06 T4 1.14  ALT 40 11/22/12 K 3.5 Creatinine 1.15 09/06/13 K 3.9 12/18/13 K 3.8 Creatinine 1.3 Pro BNP 1242  01/01/14: K 4.5, creatinine 1.29 02/05/14: K 4.1 Creatinine 1.15  SH; Works full time at [employer]. Lives with her 2 sons. Separated from her husband  ROS: All pertinent positives and negatives as in HPI, otherwise negative.     Past Medical History  Diagnosis Date  . GERD (gastroesophageal reflux disease)   . Depression   . Persistent atrial fibrillation     Myoview 4/09: No ischemia;  echo 4/09: EF 60-65%;   Flecainide, beta blocker, Coumadin;    Unable to take Tikosyn 2/2 prolonged QT  . Obstructive sleep apnea   . Borderline diabetes   . Atrial flutter   . Chronic diastolic heart failure   . Pulmonary embolism     Right lower lobe diagnosed by CT 6/12  . COPD (chronic obstructive pulmonary disease)   . Hyperlipidemia   . HTN (hypertension)   . Type II or unspecified type diabetes mellitus without mention of complication, not stated as uncontrolled   . Anxiety   . Anemia     Current Outpatient Prescriptions  Medication Sig Dispense Refill  . ALPRAZolam (XANAX) 0.5 MG tablet 1/2-1 pill PRN before procedure, if need to can take 1 at one time. Do not drive if taking. 30 tablet 0  . amiodarone (PACERONE) 200 MG tablet Take 1 tablet (200 mg total)  by mouth daily. 90 tablet 3  . aspirin-acetaminophen-caffeine (EXCEDRIN MIGRAINE) [phone] MG per tablet Take 1 tablet by mouth every 6 (six) hours as needed for headache.    . budesonide-formoterol (SYMBICORT) 160-4.5 MCG/ACT inhaler Inhale 1 puff into the lungs daily. 3 Inhaler 4  . diltiazem (CARDIZEM CD) 360 MG 24 hr capsule Take 1 capsule (360 mg total) by mouth daily. 90 capsule 3  . doxycycline (VIBRA-TABS) 100 MG tablet Take 1 tablet (100 mg total) by mouth 2 (two) times daily. For 7 days with  food 14 tablet 0  . furosemide (LASIX) 40 MG tablet Take 40 mg (1 tablet) in the morning and 20 mg (1/2 tablet) in the evening. 140 tablet 3  . levothyroxine (SYNTHROID, LEVOTHROID) 100 MCG tablet 1 pill daily 30-60 mins before food 90 tablet 4  . metFORMIN (GLUCOPHAGE XR) 500 MG 24 hr tablet Take 1 or 2 tablets once or twice daily as directed for Diabetes 360 tablet 4  . OVER THE COUNTER MEDICATION as needed. Equate Night time sleep aid    . potassium chloride (K-DUR) 10 MEQ tablet Take 4 tablets (40 mEq total) by mouth daily. 360 tablet 3  . rivaroxaban (XARELTO) 20 MG TABS tablet Take 1 tablet (20 mg total) by mouth daily. 30 tablet 3  . sertraline (ZOLOFT) 100 MG tablet Take 1 tablet (100 mg total) by mouth daily. (Patient taking differently: Take 50 mg by mouth daily. ) 90 tablet 4   No current facility-administered medications for this encounter.    Allergies: Allergies  Allergen Reactions  . Sulfonamide Derivatives Hives and Itching  . Tikosyn [Dofetilide]     Prolonged qt   Filed Vitals:   02/21/14 1530  BP: 110/68  Pulse: 88  Weight: 231 lb (104.781 kg)  SpO2: 92%   PHYSICAL EXAM: General: Well nourished, well developed, No acute distress HEENT: normalNose black irregular mole that was recently biopsied  Neck: JVD difficult to assess but appears 6-7 Cardiac: Regular rate and irregular rhythm; normal S1, S2; no murmur Lungs:  Clear. no wheezing, rhonchi or rales  Abd: obese. soft, nontender, non distended. No hepatomegaly Ext: R and LLE no edema.   Neuro:  CNs 2-12 intact, no focal abnormalities noted  ASSESSMENT AND PLAN:  1) Chronic diastolic HF:  EF 25%, mod MR (01/2012) - NYHA II-III symptoms. Volume status stable. Continue lasix 40 mg in am and 20 mg in pm. Continue 40 meq of potassium daily  Continue oxygen at night.  . -  Reinforced the need and importance of daily weights, a low sodium diet, and fluid restriction (less than 2 L a day). Instructed to call the  HF clinic if weight increases more than 3 lbs overnight or 5 lbs in a week.  2) Obesity- Needs to lose weight. Encouraged to cut back portions and be more active. Eat more protein and cut back on carbohydrates.   3) Hyperlipidemia-- Managed by PCP 4) Chronic Atrial Fibrillation.-- [place] Vasc Score 3.  Controlled rate.  Continue amiodarone and diltiazem.  Discussed the need to get these medications. Continue Xarelto 20 mg daily.   5) DM- Taking metoformin again. Renal function ok. Followed by PCP.  6) Hypothyroid-  Taking synthyroid.  Followed by PCP. 7) Hypoxia-  O2 sats >88%. Wearing oxygen at night. 8) Noncompliance-  - Taking all medications!!!  9) Basal cell carcinoma-  Biopsied 01/2014. Needs to have Mohs.   Follow up in 1 month with an ECHO /U 1 month  Paxten Appelt  NP-C 3:45 PM

## 2014-02-21 NOTE — Patient Instructions (Signed)
Doing great!  Follow up 4 weeks with echocardiogram.  Do the following things EVERYDAY: 1) Weigh yourself in the morning before breakfast. Write it down and keep it in a log. 2) Take your medicines as prescribed 3) Eat low salt foods-Limit salt (sodium) to 2000 mg per day.  4) Stay as active as you can everyday 5) Limit all fluids for the day to less than 2 liters

## 2014-03-05 ENCOUNTER — Ambulatory Visit (HOSPITAL_COMMUNITY)
Admission: RE | Admit: 2014-03-05 | Discharge: 2014-03-05 | Disposition: A | Discharge status: Home / Self Care | Payer: BLUE CROSS/BLUE SHIELD | Source: Ambulatory Visit | Attending: Physician Assistant | Admitting: Physician Assistant

## 2014-03-05 DIAGNOSIS — Z09 Encounter for follow-up examination after completed treatment for conditions other than malignant neoplasm: Secondary | ICD-10-CM | POA: Insufficient documentation

## 2014-03-05 DIAGNOSIS — J189 Pneumonia, unspecified organism: Secondary | ICD-10-CM | POA: Insufficient documentation

## 2014-04-01 ENCOUNTER — Ambulatory Visit: Payer: Self-pay | Admitting: Physician Assistant

## 2014-04-23 ENCOUNTER — Other Ambulatory Visit: Payer: Self-pay | Admitting: Internal Medicine

## 2014-04-23 DIAGNOSIS — J45901 Unspecified asthma with (acute) exacerbation: Secondary | ICD-10-CM

## 2014-04-23 DIAGNOSIS — J441 Chronic obstructive pulmonary disease with (acute) exacerbation: Secondary | ICD-10-CM

## 2014-04-23 MED ORDER — BUDESONIDE-FORMOTEROL FUMARATE 160-4.5 MCG/ACT IN AERO: 2.0000 | INHALATION_SPRAY | Freq: Every day | RESPIRATORY_TRACT | Status: DC

## 2014-05-29 ENCOUNTER — Ambulatory Visit (HOSPITAL_COMMUNITY)
Admission: RE | Admit: 2014-05-29 | Discharge: 2014-05-29 | Disposition: A | Discharge status: Home / Self Care | Payer: BLUE CROSS/BLUE SHIELD | Source: Ambulatory Visit | Attending: Cardiology | Admitting: Cardiology

## 2014-05-29 VITALS — BP 104/56 | HR 105 | Wt 239.5 lb

## 2014-05-29 DIAGNOSIS — I4891 Unspecified atrial fibrillation: Secondary | ICD-10-CM

## 2014-05-29 DIAGNOSIS — I5033 Acute on chronic diastolic (congestive) heart failure: Secondary | ICD-10-CM | POA: Diagnosis not present

## 2014-05-29 DIAGNOSIS — R9431 Abnormal electrocardiogram [ECG] [EKG]: Secondary | ICD-10-CM | POA: Insufficient documentation

## 2014-05-29 DIAGNOSIS — I5032 Chronic diastolic (congestive) heart failure: Secondary | ICD-10-CM | POA: Diagnosis not present

## 2014-05-29 MED ORDER — POTASSIUM CHLORIDE CRYS ER 20 MEQ PO TBCR
40.0000 meq | EXTENDED_RELEASE_TABLET | Freq: Once | ORAL | Status: AC
  Administered 2014-05-29: 40 meq via ORAL

## 2014-05-29 MED ORDER — FUROSEMIDE 10 MG/ML IJ SOLN
80.0000 mg | Freq: Once | INTRAMUSCULAR | Status: AC
  Administered 2014-05-29: 80 mg via INTRAVENOUS

## 2014-05-29 MED ORDER — RIVAROXABAN 20 MG PO TABS: 20.0000 mg | ORAL_TABLET | Freq: Every day | ORAL | Status: DC

## 2014-05-29 MED ORDER — METOLAZONE 5 MG PO TABS
5.0000 mg | ORAL_TABLET | Freq: Once | ORAL | Status: AC
  Administered 2014-05-29: 5 mg via ORAL

## 2014-05-29 NOTE — Addendum Note (Signed)
Encounter addended by: Effie Berkshire, RN on: 05/29/2014 10:20 AM<BR>     Documentation filed: Inpatient MAR

## 2014-05-29 NOTE — Addendum Note (Signed)
Encounter addended by: Effie Berkshire, RN on: 05/29/2014 11:08 AM<BR>     Documentation filed: Notes Section

## 2014-05-29 NOTE — Progress Notes (Signed)
Upon arrival to clinic O2 sat 80% on RA and HR 115, placed pt on 2 L oxygen via [place] and sat improved to 94% and HR 95

## 2014-05-29 NOTE — Progress Notes (Signed)
Pt had 1300 cc of urine output and states she feels better.  Per Dr Haroldine Laws pt ok to leave.

## 2014-05-29 NOTE — Addendum Note (Signed)
Encounter addended by: Effie Berkshire, RN on: 05/29/2014 10:17 AM<BR>     Documentation filed: Notes Section

## 2014-05-29 NOTE — Progress Notes (Addendum)
Patient in clinic for routine follow up, volume overloaded, per Dr. Haroldine Laws PIV 22g started in [place] x 1 attempt, patient tolerated well.  88m IV lasix x 1 pushed over 3 minutes, 579mmetolazone PO given, 4025mPO potassium given.  Bedside commode placed in clinic room next to patient will call bell in reach.  Will monitor patient closely.  Total Urinary Output: 1300cc  PIV DC'd and clean, dry gauze dressing applied to site before patient discharged from appointment today.  BraRenee Pain

## 2014-05-29 NOTE — Patient Instructions (Signed)
Increase Furosemide (Lasix) to 40 mg Twice daily for 3 days, then back to 40 mg in AM and 20 mg in PM  Please weigh yourself daily and call us if you gain 3 lb overnight or 5 lb in a week  Please limit your fluid intake to less than 2 Liters daily  Your physician has requested that you have an echocardiogram. Echocardiography is a painless test that uses sound waves to create images of your heart. It provides your doctor with information about the size and shape of your heart and how well your heart's chambers and valves are working. This procedure takes approximately one hour. There are no restrictions for this procedure.  Your physician recommends that you schedule a follow-up appointment in: 2 weeks  Do the following things EVERYDAY: 1) Weigh yourself in the morning before breakfast. Write it down and keep it in a log. 2) Take your medicines as prescribed 3) Eat low salt foods-Limit salt (sodium) to 2000 mg per day.  4) Stay as active as you can everyday 5) Limit all fluids for the day to less than 2 liters

## 2014-05-29 NOTE — Progress Notes (Signed)
Patient ID: [name], female   DOB: 15-Sep-1960, 54 y.o.   MRN: [id number]  PCP:  Dr. Idell Pickles  Primary Cardiologist:  Dr. [name] Pulmonologist: Dr Gwenette Greet EP: Dr Rayann Heman  History of Present Illness: [name] is a 54 y.o. female with a history of chronic diastolic heart failure, chronic AF, HTN, hyperlipidemia, sleep apnea, glucose intolerance, COPD and GERD.  She was admitted 6/2-6/11/2010.  Prior to admission, she had stopped all of her medications due to difficulties with finances.  She had been developing increasing dyspnea requiring several rounds of antibiotics and prednisone taper.  She presented to the hospital with narrow complex tachycardia treated with adenosine x2.  She developed normal sinus rhythm briefly and then converted to atrial flutter with rapid ventricular rate.  Myocardial infarction was ruled out.  She continued to have intermittent episodes of atrial flutter.  She was placed on flecainide.   She was seen by EP.  Given her multiple atrial arrhythmias, ablation therapy was not felt to be helpful and she was continued on flecainide.  She had continued dyspnea and CT scan demonstrated a right lower lobe nonocclusive pulmonary embolism.  She was placed on heparin and Coumadin.    Admitted 01/14/12 with afib with RVR (NSR 09/23/10) and volume overload.  During hospitalization Underwent TEE/DCCV and DCCV  which showed no thrombus.  She failed 2 cardioversions therefore rate control was pursued. Flecainide was stopped and transitioned to cardizem and amiodarone. She was also placed on Xarelto. Discharge weight 215 pounds.  Follow up for Heart Failure: Presents for routine f/u. Over past few weeks has had more swelling and SOB. Abdomen distended. Weight up about 8-10 pounds. SOB with minimal activity. + orthopnea.  Ran out of meds in February. Restarted amiodarone in March but has not restarted Xarelto. Taking lasix once a day (was supposed to be taking 40/20) Drinking  a lot of fluid and ice.   Labs  07/31/12 TSH 6.06 T4 1.14  ALT 40 11/22/12 K 3.5 Creatinine 1.15 09/06/13 K 3.9 12/18/13 K 3.8 Creatinine 1.3 Pro BNP 1242  01/01/14: K 4.5, creatinine 1.29 02/05/14: K 4.1 Creatinine 1.15  SH; Works full time at [employer]. Lives with her 2 sons. Separated from her husband  ROS: All pertinent positives and negatives as in HPI, otherwise negative.     Past Medical History  Diagnosis Date  . GERD (gastroesophageal reflux disease)   . Depression   . Persistent atrial fibrillation     Myoview 4/09: No ischemia;  echo 4/09: EF 60-65%;   Flecainide, beta blocker, Coumadin;    Unable to take Tikosyn 2/2 prolonged QT  . Obstructive sleep apnea   . Borderline diabetes   . Atrial flutter   . Chronic diastolic heart failure   . Pulmonary embolism     Right lower lobe diagnosed by CT 6/12  . COPD (chronic obstructive pulmonary disease)   . Hyperlipidemia   . HTN (hypertension)   . Type II or unspecified type diabetes mellitus without mention of complication, not stated as uncontrolled   . Anxiety   . Anemia     Current Outpatient Prescriptions  Medication Sig Dispense Refill  . ALPRAZolam (XANAX) 0.5 MG tablet 1/2-1 pill PRN before procedure, if need to can take 1 at one time. Do not drive if taking. 30 tablet 0  . amiodarone (PACERONE) 200 MG tablet Take 1 tablet (200 mg total) by mouth daily. 90 tablet 3  . aspirin-acetaminophen-caffeine (EXCEDRIN MIGRAINE) 481-856-31 MG per  tablet Take 1 tablet by mouth every 6 (six) hours as needed for headache.    . budesonide-formoterol (SYMBICORT) 160-4.5 MCG/ACT inhaler Inhale 2 puffs into the lungs daily. 3 Inhaler 4  . diltiazem (CARDIZEM CD) 360 MG 24 hr capsule Take 1 capsule (360 mg total) by mouth daily. 90 capsule 3  . furosemide (LASIX) 40 MG tablet Take 40 mg (1 tablet) in the morning and 20 mg (1/2 tablet) in the evening. 140 tablet 3  . levothyroxine (SYNTHROID, LEVOTHROID) 100 MCG tablet 1 pill  daily 30-60 mins before food 90 tablet 4  . metFORMIN (GLUCOPHAGE XR) 500 MG 24 hr tablet Take 1 or 2 tablets once or twice daily as directed for Diabetes (Patient taking differently: Take 500 mg by mouth daily with breakfast. Take 1 or 2 tablets once or twice daily as directed for Diabetes) 360 tablet 4  . OVER THE COUNTER MEDICATION as needed. Equate Night time sleep aid    . potassium chloride (K-DUR) 10 MEQ tablet Take 4 tablets (40 mEq total) by mouth daily. 360 tablet 3  . sertraline (ZOLOFT) 100 MG tablet Take 1 tablet (100 mg total) by mouth daily. (Patient taking differently: Take 100 mg by mouth daily as needed. ) 90 tablet 4   No current facility-administered medications for this encounter.    Allergies: Allergies  Allergen Reactions  . Sulfonamide Derivatives Hives and Itching  . Tikosyn [Dofetilide]     Prolonged qt   Filed Vitals:   05/29/14 0824  BP: 104/56  Pulse: 105  Weight: 239 lb 8 oz (108.636 kg)  SpO2: 94%   PHYSICAL EXAM: General: Comfortable at rest with O2. But desatas with exertion. No acute distress HEENT: normalNose black irregular mole Neck: JVD difficult to assess appears somewhat elevated Cardiac: Irregular rate and  rhythm; normal S1, S2; no murmur Lungs:  Clear. no wheezing, rhonchi or rales  Abd: obese. soft, nontender, + distended. No hepatomegaly Ext: R and LLE no edema.   Neuro:  CNs 2-12 intact, no focal abnormalities noted  ASSESSMENT AND PLAN:  1) Acute on chronic diastolic HF:  EF 16%, mod MR (01/2012) - NYHA III symptoms. Volume status elevated in setting of medication and dietary noncompliance. Will give IV lasix and metolazone in clinic. Double lasix to 40 bid for 3 days then resume lasix 40 mg in am and 20 mg in pm. Continue 40 meq of potassium daily . -  Reinforced the need and importance of daily weights, a low sodium diet, and fluid restriction (less than 2 L a day). Instructed to call the HF clinic if weight increases more than 3  lbs overnight or 5 lbs in a week.  - Follow up 2 weeks with echo 2) Obesity- Needs to lose weight. Encouraged to cut back portions and be more active. Eat more protein and cut back on carbohydrates.   3) Hyperlipidemia-- Managed by PCP 4) Chronic Atrial Fibrillation.-- [place] Vasc Score 3.  Controlled rate.  Would eventually like to stop amiodarone but will wait until HF more controlled. Continue diltiazem. Consider switching amio to bisoprolol at next visit 5) DM- Taking metoformin again. Renal function ok. Followed by PCP.  6) Hypothyroid-  Taking synthyroid.  Followed by PCP. 7) Hypoxia-  O2 sats low today - hopefully will improve with diuresis. Will need to assess ambulatory sats after diuresis. Wearing oxygen at night. 8) Noncompliance-  - Discussed need for compliance. Will get her Xarelto refilled. 9) Basal cell carcinoma-  Biopsied 01/2014. Needs  to have Mohs. She is deferring this due to cost.   [name] MD  8:43 AM

## 2014-05-29 NOTE — Addendum Note (Signed)
Encounter addended by: Scarlette Calico, RN on: 05/29/2014 10:53 AM<BR>     Documentation filed: Notes Section, Patient Instructions Section, Orders

## 2014-05-30 NOTE — Addendum Note (Signed)
Encounter addended by: Georga Kaufmann, CCT on: 05/30/2014  7:29 AM<BR>     Documentation filed: Charges VN

## 2014-06-17 ENCOUNTER — Ambulatory Visit (HOSPITAL_COMMUNITY)
Admission: RE | Admit: 2014-06-17 | Discharge: 2014-06-17 | Disposition: A | Discharge status: Home / Self Care | Payer: BLUE CROSS/BLUE SHIELD | Source: Ambulatory Visit | Attending: Cardiology | Admitting: Cardiology

## 2014-06-17 ENCOUNTER — Encounter (HOSPITAL_COMMUNITY): Payer: Self-pay

## 2014-06-17 VITALS — BP 128/76 | HR 87 | Resp 18 | Wt 243.0 lb

## 2014-06-17 DIAGNOSIS — I482 Chronic atrial fibrillation, unspecified: Secondary | ICD-10-CM

## 2014-06-17 DIAGNOSIS — I5032 Chronic diastolic (congestive) heart failure: Secondary | ICD-10-CM

## 2014-06-17 MED ORDER — BISOPROLOL FUMARATE 5 MG PO TABS: 2.5000 mg | ORAL_TABLET | Freq: Every day | ORAL | Status: DC

## 2014-06-17 MED ORDER — METOLAZONE 2.5 MG PO TABS: ORAL_TABLET | ORAL | Status: DC

## 2014-06-17 MED ORDER — POTASSIUM CHLORIDE ER 10 MEQ PO TBCR: EXTENDED_RELEASE_TABLET | ORAL | Status: DC

## 2014-06-17 NOTE — Progress Notes (Signed)
PCP:  Dr. Idell Pickles  Primary Cardiologist:  Dr. [name] Pulmonologist: Dr Gwenette Greet EP: Dr Rayann Heman  History of Present Illness: [name] is a 54 y.o. female with a history of chronic diastolic heart failure, chronic AF, HTN, hyperlipidemia, sleep apnea, glucose intolerance, COPD and GERD.  She was admitted 6/2-6/11/2010.  Prior to admission, she had stopped all of her medications due to difficulties with finances.  She had been developing increasing dyspnea requiring several rounds of antibiotics and prednisone taper.  She presented to the hospital with narrow complex tachycardia treated with adenosine x2.  She developed normal sinus rhythm briefly and then converted to atrial flutter with rapid ventricular rate.  Myocardial infarction was ruled out.  She continued to have intermittent episodes of atrial flutter.  She was placed on flecainide.   She was seen by EP.  Given her multiple atrial arrhythmias, ablation therapy was not felt to be helpful and she was continued on flecainide.  She had continued dyspnea and CT scan demonstrated a right lower lobe nonocclusive pulmonary embolism.  She was placed on heparin and Coumadin.    Admitted 01/14/12 with afib with RVR (NSR 09/23/10) and volume overload.  During hospitalization Underwent TEE/DCCV and DCCV  which showed no thrombus.  She failed 2 cardioversions therefore rate control was pursued. Flecainide was stopped and transitioned to cardizem and amiodarone. She was also placed on Xarelto. Discharge weight 215 pounds.  Follow up for Heart Failure: Last visit she was given IV lasix and po lasix was increased for 3 days. Had high salt food for Mothers Day. Not following low salt diet. Did not take lasix for the last 48 hours.  Not weighing because she cant see the numbers. Uses oxygen at home. Drinking extra fluid. Tries to take medications. Has worn CPAP but stopped because its broken.   Echo today: EF 55-60% perhaps mild RV HK  Labs   07/31/12 TSH 6.06 T4 1.14  ALT 40 11/22/12 K 3.5 Creatinine 1.15 09/06/13 K 3.9 12/18/13 K 3.8 Creatinine 1.3 Pro BNP 1242  01/01/14: K 4.5, creatinine 1.29 02/05/14: K 4.1 Creatinine 1.15  SH; Works full time at [employer]. Lives with her 2 sons. Separated from her husband  ROS: All pertinent positives and negatives as in HPI, otherwise negative.     Past Medical History  Diagnosis Date  . GERD (gastroesophageal reflux disease)   . Depression   . Persistent atrial fibrillation     Myoview 4/09: No ischemia;  echo 4/09: EF 60-65%;   Flecainide, beta blocker, Coumadin;    Unable to take Tikosyn 2/2 prolonged QT  . Obstructive sleep apnea   . Borderline diabetes   . Atrial flutter   . Chronic diastolic heart failure   . Pulmonary embolism     Right lower lobe diagnosed by CT 6/12  . COPD (chronic obstructive pulmonary disease)   . Hyperlipidemia   . HTN (hypertension)   . Type II or unspecified type diabetes mellitus without mention of complication, not stated as uncontrolled   . Anxiety   . Anemia     Current Outpatient Prescriptions  Medication Sig Dispense Refill  . ALPRAZolam (XANAX) 0.5 MG tablet 1/2-1 pill PRN before procedure, if need to can take 1 at one time. Do not drive if taking. 30 tablet 0  . amiodarone (PACERONE) 200 MG tablet Take 1 tablet (200 mg total) by mouth daily. 90 tablet 3  . aspirin-acetaminophen-caffeine (EXCEDRIN MIGRAINE) [id number] MG per tablet Take 1 tablet by  mouth every 6 (six) hours as needed for headache.    . budesonide-formoterol (SYMBICORT) 160-4.5 MCG/ACT inhaler Inhale 2 puffs into the lungs daily. 3 Inhaler 4  . diltiazem (CARDIZEM CD) 360 MG 24 hr capsule Take 1 capsule (360 mg total) by mouth daily. 90 capsule 3  . furosemide (LASIX) 40 MG tablet Take 40 mg (1 tablet) in the morning and 20 mg (1/2 tablet) in the evening. 140 tablet 3  . levothyroxine (SYNTHROID, LEVOTHROID) 100 MCG tablet 1 pill daily 30-60 mins before food 90  tablet 4  . metFORMIN (GLUCOPHAGE XR) 500 MG 24 hr tablet Take 1 or 2 tablets once or twice daily as directed for Diabetes (Patient taking differently: Take 500 mg by mouth daily with breakfast. Take 1 or 2 tablets once or twice daily as directed for Diabetes) 360 tablet 4  . OVER THE COUNTER MEDICATION as needed. Equate Night time sleep aid    . potassium chloride (K-DUR) 10 MEQ tablet Take 4 tablets (40 mEq total) by mouth daily. 360 tablet 3  . rivaroxaban (XARELTO) 20 MG TABS tablet Take 1 tablet (20 mg total) by mouth daily with supper. 30 tablet 12  . sertraline (ZOLOFT) 100 MG tablet Take 1 tablet (100 mg total) by mouth daily. (Patient taking differently: Take 100 mg by mouth daily as needed. ) 90 tablet 4   No current facility-administered medications for this encounter.    Allergies: Allergies  Allergen Reactions  . Sulfonamide Derivatives Hives and Itching  . Tikosyn [Dofetilide]     Prolonged qt   Filed Vitals:   06/17/14 1432  BP: 128/76  Pulse: 87  Resp: 18  Weight: 243 lb (110.224 kg)  SpO2: 93%   PHYSICAL EXAM: General: Comfortable at rest with O2. But desatas with exertion. No acute distress HEENT: normalNose black irregular mole Neck: JVD difficult to assess appears somewhat elevated Cardiac: Irregular rate and  rhythm; normal S1, S2; no murmur Lungs:  Clear. no wheezing, rhonchi or rales  Abd: obese. soft, nontender, + distended. No hepatomegaly Ext: R and LLE no edema.   Neuro:  CNs 2-12 intact, no focal abnormalities noted  ASSESSMENT AND PLAN:  1) Acute on chronic diastolic HF:  EF 99%, mod MR (01/2012), echo today EF 55-60% moderate MR - NYHA III symptoms. Volume status elevated in setting of dietaryy noncompliance.  . Increase lasix to 40 mg twice a day. Will give metolazone to use as needed when swelling gets really bad. (take with Kcl 20)    Reinforced the need and importance of daily weights, a low sodium diet, and fluid restriction (less than 2 L a  day). Instructed to call the HF clinic if weight increases more than 3 lbs overnight or 5 lbs in a week.   2) Obesity- Needs to lose weight. Encouraged to cut back portions and be more active. Eat more protein and cut back on carbohydrates.   3) Hyperlipidemia-- Managed by PCP 4) Chronic Atrial Fibrillation.-- [place] Vasc Score 3.  Controlled rate. Will stop amio and switch to bisoprolol 2.5 mg daily.  Continue diltiazem. Continue Xarelto. 5) DM- Taking metoformin again. Renal function ok. Followed by PCP.  6) Hypothyroid-  Taking synthyroid.  Followed by PCP. 7) Hypoxia-  O2 sats low today - hopefully will improve with diuresis. Will need to assess ambulatory sats after diuresis. Wearing oxygen at night. 8) Noncompliance-  - Discussed need for compliance. Will get her Xarelto refilled. 9) Basal cell carcinoma-  Biopsied 01/2014. Needs to  have Mohs. She is deferring this due to cost.   CLEGG,AMY NP-C 2:40 PM  Patient seen and examined with Darrick Grinder, NP. We discussed all aspects of the encounter. I agree with the assessment and plan as stated above.   Echo images reviewed personally and discussed with her. LVEF normal. Volume status moderately elevated in the setting of noncompliance with dietary restriction and  lasix. Agree with increasing lasix to 40 bid. Add metolazone as needed. Now in chronic AF. Can stop amio. Switch to bisoprolol. Continue Xarelto. Needs to have CPAP fixed. Long talk about need to watch fluid intake and take lasix regularly.   Total time spent 45 minutes. Over half that time spent discussing above.   Bensimhon, Daniel,MD 3:52 PM

## 2014-06-17 NOTE — Patient Instructions (Addendum)
Please stop Amiodarone Begin Bisoprolol 2.5 mg daily  Take 2.5 mg of Metolazone 30 minutes before am dose of Lasix as needed when you notice increased swelling Take extra 20 Meq of Potassium when you take the Metolazone Call the clinic if you take Metolazone more than three days in a row    Follow up 2 weeks.  Do the following things EVERYDAY: 1) Weigh yourself in the morning before breakfast. Write it down and keep it in a log. 2) Take your medicines as prescribed 3) Eat low salt foods-Limit salt (sodium) to 2000 mg per day.  4) Stay as active as you can everyday 5) Limit all fluids for the day to less than 2 liters

## 2014-06-17 NOTE — Progress Notes (Signed)
  Echocardiogram 2D Echocardiogram has been performed.  Darlina Sicilian M 06/17/2014, 1:30 PM

## 2014-06-17 NOTE — Addendum Note (Signed)
Encounter addended by: Micki Riley, RN on: 06/17/2014  4:06 PM<BR>     Documentation filed: Medications, Dx Association, Patient Instructions Section, Orders

## 2014-06-18 ENCOUNTER — Other Ambulatory Visit (HOSPITAL_COMMUNITY): Payer: Self-pay | Admitting: *Deleted

## 2014-06-18 DIAGNOSIS — I5032 Chronic diastolic (congestive) heart failure: Secondary | ICD-10-CM

## 2014-06-18 MED ORDER — POTASSIUM CHLORIDE ER 10 MEQ PO TBCR: 40.0000 meq | EXTENDED_RELEASE_TABLET | Freq: Every day | ORAL | Status: DC

## 2014-07-01 ENCOUNTER — Ambulatory Visit (HOSPITAL_COMMUNITY)
Admission: RE | Admit: 2014-07-01 | Discharge: 2014-07-01 | Disposition: A | Discharge status: Home / Self Care | Payer: BLUE CROSS/BLUE SHIELD | Source: Ambulatory Visit | Attending: Cardiology | Admitting: Cardiology

## 2014-07-01 ENCOUNTER — Encounter (HOSPITAL_COMMUNITY): Payer: Self-pay

## 2014-07-01 VITALS — BP 119/60 | HR 93 | Resp 18 | Wt 232.0 lb

## 2014-07-01 DIAGNOSIS — R0902 Hypoxemia: Secondary | ICD-10-CM | POA: Diagnosis not present

## 2014-07-01 DIAGNOSIS — J449 Chronic obstructive pulmonary disease, unspecified: Secondary | ICD-10-CM | POA: Diagnosis not present

## 2014-07-01 DIAGNOSIS — E669 Obesity, unspecified: Secondary | ICD-10-CM | POA: Insufficient documentation

## 2014-07-01 DIAGNOSIS — I10 Essential (primary) hypertension: Secondary | ICD-10-CM | POA: Insufficient documentation

## 2014-07-01 DIAGNOSIS — E039 Hypothyroidism, unspecified: Secondary | ICD-10-CM | POA: Insufficient documentation

## 2014-07-01 DIAGNOSIS — I482 Chronic atrial fibrillation, unspecified: Secondary | ICD-10-CM

## 2014-07-01 DIAGNOSIS — Z91199 Patient's noncompliance with other medical treatment and regimen due to unspecified reason: Secondary | ICD-10-CM

## 2014-07-01 DIAGNOSIS — Z9119 Patient's noncompliance with other medical treatment and regimen: Secondary | ICD-10-CM | POA: Diagnosis not present

## 2014-07-01 DIAGNOSIS — G4733 Obstructive sleep apnea (adult) (pediatric): Secondary | ICD-10-CM | POA: Diagnosis not present

## 2014-07-01 DIAGNOSIS — I5032 Chronic diastolic (congestive) heart failure: Secondary | ICD-10-CM

## 2014-07-01 DIAGNOSIS — Z7982 Long term (current) use of aspirin: Secondary | ICD-10-CM | POA: Insufficient documentation

## 2014-07-01 DIAGNOSIS — K219 Gastro-esophageal reflux disease without esophagitis: Secondary | ICD-10-CM | POA: Insufficient documentation

## 2014-07-01 DIAGNOSIS — Z7901 Long term (current) use of anticoagulants: Secondary | ICD-10-CM | POA: Insufficient documentation

## 2014-07-01 DIAGNOSIS — Z79899 Other long term (current) drug therapy: Secondary | ICD-10-CM | POA: Diagnosis not present

## 2014-07-01 DIAGNOSIS — F329 Major depressive disorder, single episode, unspecified: Secondary | ICD-10-CM | POA: Diagnosis not present

## 2014-07-01 DIAGNOSIS — E785 Hyperlipidemia, unspecified: Secondary | ICD-10-CM | POA: Diagnosis not present

## 2014-07-01 DIAGNOSIS — E119 Type 2 diabetes mellitus without complications: Secondary | ICD-10-CM | POA: Insufficient documentation

## 2014-07-01 DIAGNOSIS — F419 Anxiety disorder, unspecified: Secondary | ICD-10-CM | POA: Insufficient documentation

## 2014-07-01 DIAGNOSIS — Z85828 Personal history of other malignant neoplasm of skin: Secondary | ICD-10-CM | POA: Insufficient documentation

## 2014-07-01 LAB — BASIC METABOLIC PANEL
Anion gap: 11 (ref 5–15)
BUN: 19 mg/dL (ref 6–20)
CO2: 39 mmol/L — ABNORMAL HIGH (ref 22–32)
Calcium: 10 mg/dL (ref 8.9–10.3)
Chloride: 92 mmol/L — ABNORMAL LOW (ref 101–111)
Creatinine, Ser: 1.76 mg/dL — ABNORMAL HIGH (ref 0.44–1.00)
GFR calc Af Amer: 36 mL/min — ABNORMAL LOW (ref >60)
GFR calc non Af Amer: 31 mL/min — ABNORMAL LOW (ref >60)
Glucose, Bld: 103 mg/dL — ABNORMAL HIGH (ref 65–99)
Potassium: 3 mmol/L — ABNORMAL LOW (ref 3.5–5.1)
Sodium: 142 mmol/L (ref 135–145)

## 2014-07-01 MED ORDER — BISOPROLOL FUMARATE 5 MG PO TABS: 5.0000 mg | ORAL_TABLET | Freq: Every day | ORAL | Status: DC

## 2014-07-01 NOTE — Progress Notes (Signed)
Patient ID: [name], female   DOB: 12/31/1960, 54 y.o.   MRN: [id number]  PCP:  Dr. Idell Pickles  Primary Cardiologist:  Dr. [name] Pulmonologist: Dr Gwenette Greet EP: Dr Rayann Heman  History of Present Illness: [name] is a 54 y.o. female with a history of chronic diastolic heart failure, chronic AF, HTN, hyperlipidemia, sleep apnea, glucose intolerance, COPD and GERD.  She was admitted 6/2-6/11/2010.  Prior to admission, she had stopped all of her medications due to difficulties with finances.  She had been developing increasing dyspnea requiring several rounds of antibiotics and prednisone taper.  She presented to the hospital with narrow complex tachycardia treated with adenosine x2.  She developed normal sinus rhythm briefly and then converted to atrial flutter with rapid ventricular rate.  Myocardial infarction was ruled out.  She continued to have intermittent episodes of atrial flutter.  She was placed on flecainide.   She was seen by EP.  Given her multiple atrial arrhythmias, ablation therapy was not felt to be helpful and she was continued on flecainide.  She had continued dyspnea and CT scan demonstrated a right lower lobe nonocclusive pulmonary embolism.  She was placed on heparin and Coumadin.    Admitted 01/14/12 with afib with RVR (NSR 09/23/10) and volume overload.  During hospitalization Underwent TEE/DCCV and DCCV  which showed no thrombus.  She failed 2 cardioversions therefore rate control was pursued. Flecainide was stopped and transitioned to cardizem and amiodarone. She was also placed on Xarelto. Discharge weight 215 pounds.  Follow up for Heart Failure: Last visit she was instructed to increase lasix and stop amiodarone and start bisoprolol. Does not weigh at home because she cant see the numbers due to size of abdomen. Says she is feeling better. Denies PND/Orthopnea. Mild dyspnea with ambulation. Wearing oxygen at night. Taking all medications.    06/17/2014 ECHO EF  55-60% perhaps mild RV HK  Labs  07/31/12 TSH 6.06 T4 1.14  ALT 40 11/22/12 K 3.5 Creatinine 1.15 09/06/13 K 3.9 12/18/13 K 3.8 Creatinine 1.3 Pro BNP 1242  01/01/14: K 4.5, creatinine 1.29 02/05/14: K 4.1 Creatinine 1.15  SH; Works full time at [employer]. Lives with her 2 sons. Separated from her husband  ROS: All pertinent positives and negatives as in HPI, otherwise negative.     Past Medical History  Diagnosis Date  . GERD (gastroesophageal reflux disease)   . Depression   . Persistent atrial fibrillation     Myoview 4/09: No ischemia;  echo 4/09: EF 60-65%;   Flecainide, beta blocker, Coumadin;    Unable to take Tikosyn 2/2 prolonged QT  . Obstructive sleep apnea   . Borderline diabetes   . Atrial flutter   . Chronic diastolic heart failure   . Pulmonary embolism     Right lower lobe diagnosed by CT 6/12  . COPD (chronic obstructive pulmonary disease)   . Hyperlipidemia   . HTN (hypertension)   . Type II or unspecified type diabetes mellitus without mention of complication, not stated as uncontrolled   . Anxiety   . Anemia     Current Outpatient Prescriptions  Medication Sig Dispense Refill  . ALPRAZolam (XANAX) 0.5 MG tablet 1/2-1 pill PRN before procedure, if need to can take 1 at one time. Do not drive if taking. 30 tablet 0  . aspirin-acetaminophen-caffeine (EXCEDRIN MIGRAINE) [id number] MG per tablet Take 1 tablet by mouth every 6 (six) hours as needed for headache.    . bisoprolol (ZEBETA) 5 MG  tablet Take 0.5 tablets (2.5 mg total) by mouth daily. 30 tablet 3  . budesonide-formoterol (SYMBICORT) 160-4.5 MCG/ACT inhaler Inhale 2 puffs into the lungs daily. 3 Inhaler 4  . diltiazem (CARDIZEM CD) 360 MG 24 hr capsule Take 1 capsule (360 mg total) by mouth daily. 90 capsule 3  . furosemide (LASIX) 40 MG tablet Take 40 mg (1 tablet) in the morning and 20 mg (1/2 tablet) in the evening. 140 tablet 3  . levothyroxine (SYNTHROID, LEVOTHROID) 100 MCG tablet 1 pill  daily 30-60 mins before food 90 tablet 4  . metFORMIN (GLUCOPHAGE XR) 500 MG 24 hr tablet Take 1 or 2 tablets once or twice daily as directed for Diabetes (Patient taking differently: Take 500 mg by mouth daily with breakfast. Take 1 or 2 tablets once or twice daily as directed for Diabetes) 360 tablet 4  . metolazone (ZAROXOLYN) 2.5 MG tablet As needed for increase in swelling 90 tablet 3  . OVER THE COUNTER MEDICATION as needed. Equate Night time sleep aid    . potassium chloride (K-DUR) 10 MEQ tablet Take 4 tablets (40 mEq total) by mouth daily. Take extra 20 meq when you take a dose of Metolazone 360 tablet 3  . rivaroxaban (XARELTO) 20 MG TABS tablet Take 1 tablet (20 mg total) by mouth daily with supper. 30 tablet 12  . sertraline (ZOLOFT) 100 MG tablet Take 1 tablet (100 mg total) by mouth daily. (Patient taking differently: Take 100 mg by mouth daily as needed. ) 90 tablet 4   No current facility-administered medications for this encounter.    Allergies: Allergies  Allergen Reactions  . Sulfonamide Derivatives Hives and Itching  . Tikosyn [Dofetilide]     Prolonged qt   Filed Vitals:   07/01/14 1420  BP: 119/60  Pulse: 93  Resp: 18  Weight: 232 lb (105.235 kg)  SpO2: 92%   PHYSICAL EXAM: General: NAD  No acute distress HEENT: normalNose black irregular mole Neck: JVD difficult to assess appears somewhat elevated Cardiac: Irregular rate and  rhythm; normal S1, S2; no murmur Lungs:  Clear. no wheezing, rhonchi or rales  Abd: obese. soft, nontender, nondistended. No hepatomegaly Ext: R and LLE no edema.   Neuro:  CNs 2-12 intact, no focal abnormalities noted  ASSESSMENT AND PLAN:  1) Acute on chronic diastolic HF:  EF 43%, mod MR (01/2012), echo today EF 55-60% moderate MR - NYHA III symptoms.  Continue current dose of lasix. Will give metolazone to use as needed when swelling gets really bad.    Reinforced the need and importance of daily weights, a low sodium diet, and  fluid restriction (less than 2 L a day). Instructed to call the HF clinic if weight increases more than 3 lbs overnight or 5 lbs in a week.  2) Obesity- Needs to lose weight. Encouraged to cut back portions and be more active.  3) Hyperlipidemia-- Managed by PCP 4) Chronic Atrial Fibrillation.-- [place] Vasc Score 3.  Controlled rate. Off amio. Increase  bisoprolol 5 mg daily.  Continue diltiazem. Continue Xarelto. 5) DM- Taking metoformin again. Followed by PCP.  6) Hypothyroid-  Taking synthyroid.  Followed by PCP. 7) Hypoxia-  O2 sats stable today . Continue oxygen at night. 8) Noncompliance-  Discussed need for compliance. Will get her Xarelto refilled. 9) Basal cell carcinoma-  Biopsied 01/2014. Needs to have Mohs. She is deferring this due to cost.   Check BMET today.  Follow up in 6 weeks.   CLEGG,AMY NP-C  2:14 PM

## 2014-07-01 NOTE — Patient Instructions (Addendum)
Doing great!  INCREASE Bisoprolol to 5 mg (1 whole tablet) once daily.  Follow up 6 weeks.  Do the following things EVERYDAY: 1) Weigh yourself in the morning before breakfast. Write it down and keep it in a log. 2) Take your medicines as prescribed 3) Eat low salt foods-Limit salt (sodium) to 2000 mg per day.  4) Stay as active as you can everyday 5) Limit all fluids for the day to less than 2 liters

## 2014-07-10 ENCOUNTER — Ambulatory Visit (HOSPITAL_COMMUNITY)
Admission: RE | Admit: 2014-07-10 | Discharge: 2014-07-10 | Disposition: A | Discharge status: Home / Self Care | Payer: BLUE CROSS/BLUE SHIELD | Source: Ambulatory Visit | Attending: Cardiology | Admitting: Cardiology

## 2014-07-10 DIAGNOSIS — I5032 Chronic diastolic (congestive) heart failure: Secondary | ICD-10-CM | POA: Diagnosis not present

## 2014-07-10 DIAGNOSIS — I5022 Chronic systolic (congestive) heart failure: Secondary | ICD-10-CM | POA: Diagnosis not present

## 2014-07-10 DIAGNOSIS — I5033 Acute on chronic diastolic (congestive) heart failure: Secondary | ICD-10-CM

## 2014-07-10 LAB — BASIC METABOLIC PANEL
Anion gap: 11 (ref 5–15)
BUN: 16 mg/dL (ref 6–20)
CO2: 33 mmol/L — ABNORMAL HIGH (ref 22–32)
Calcium: 8.4 mg/dL — ABNORMAL LOW (ref 8.9–10.3)
Chloride: 98 mmol/L — ABNORMAL LOW (ref 101–111)
Creatinine, Ser: 1.63 mg/dL — ABNORMAL HIGH (ref 0.44–1.00)
GFR calc Af Amer: 40 mL/min — ABNORMAL LOW (ref >60)
GFR calc non Af Amer: 34 mL/min — ABNORMAL LOW (ref >60)
Glucose, Bld: 101 mg/dL — ABNORMAL HIGH (ref 65–99)
Potassium: 3.4 mmol/L — ABNORMAL LOW (ref 3.5–5.1)
Sodium: 142 mmol/L (ref 135–145)

## 2014-07-11 ENCOUNTER — Telehealth (HOSPITAL_COMMUNITY): Payer: Self-pay | Admitting: Cardiology

## 2014-07-11 NOTE — Telephone Encounter (Signed)
Pt aware and voiced understanding, Added to lab schedule 6/8

## 2014-07-11 NOTE — Telephone Encounter (Signed)
-----  Message from Conrad Chatsworth, NP sent at 07/11/2014  9:19 AM EDT ----- Please call. Instruct to hold lasix for 2 days. Restart lasix 40 mg daily in 2 days. Stop pm lasix . Repeat BMET next week.

## 2014-07-17 ENCOUNTER — Ambulatory Visit (HOSPITAL_COMMUNITY)
Admission: RE | Admit: 2014-07-17 | Discharge: 2014-07-17 | Disposition: A | Discharge status: Home / Self Care | Payer: BLUE CROSS/BLUE SHIELD | Source: Ambulatory Visit | Attending: Internal Medicine | Admitting: Internal Medicine

## 2014-07-17 DIAGNOSIS — I5022 Chronic systolic (congestive) heart failure: Secondary | ICD-10-CM | POA: Diagnosis not present

## 2014-07-17 DIAGNOSIS — I5032 Chronic diastolic (congestive) heart failure: Secondary | ICD-10-CM | POA: Diagnosis present

## 2014-07-17 LAB — BASIC METABOLIC PANEL WITH GFR
Anion gap: 9 (ref 5–15)
BUN: 11 mg/dL (ref 6–20)
CO2: 32 mmol/L (ref 22–32)
Calcium: 9.1 mg/dL (ref 8.9–10.3)
Chloride: 101 mmol/L (ref 101–111)
Creatinine, Ser: 1.53 mg/dL — ABNORMAL HIGH (ref 0.44–1.00)
GFR calc Af Amer: 43 mL/min — ABNORMAL LOW (ref >60)
GFR calc non Af Amer: 37 mL/min — ABNORMAL LOW (ref >60)
Glucose, Bld: 92 mg/dL (ref 65–99)
Potassium: 4.1 mmol/L (ref 3.5–5.1)
Sodium: 142 mmol/L (ref 135–145)

## 2014-08-05 ENCOUNTER — Other Ambulatory Visit: Payer: Self-pay

## 2014-08-14 ENCOUNTER — Other Ambulatory Visit: Payer: Self-pay | Admitting: *Deleted

## 2014-08-14 ENCOUNTER — Other Ambulatory Visit (HOSPITAL_COMMUNITY): Payer: Self-pay | Admitting: *Deleted

## 2014-08-14 DIAGNOSIS — I4891 Unspecified atrial fibrillation: Secondary | ICD-10-CM

## 2014-08-14 DIAGNOSIS — J441 Chronic obstructive pulmonary disease with (acute) exacerbation: Secondary | ICD-10-CM

## 2014-08-14 DIAGNOSIS — J45901 Unspecified asthma with (acute) exacerbation: Secondary | ICD-10-CM

## 2014-08-14 MED ORDER — DILTIAZEM HCL ER COATED BEADS 360 MG PO CP24: 360.0000 mg | ORAL_CAPSULE | Freq: Every day | ORAL | Status: DC

## 2014-08-14 MED ORDER — BUDESONIDE-FORMOTEROL FUMARATE 160-4.5 MCG/ACT IN AERO: 2.0000 | INHALATION_SPRAY | Freq: Every day | RESPIRATORY_TRACT | Status: DC

## 2014-10-28 ENCOUNTER — Other Ambulatory Visit: Payer: Self-pay | Admitting: Internal Medicine

## 2015-01-23 ENCOUNTER — Other Ambulatory Visit: Payer: Self-pay | Admitting: Internal Medicine

## 2015-01-26 ENCOUNTER — Other Ambulatory Visit: Payer: Self-pay | Admitting: Internal Medicine

## 2015-01-26 ENCOUNTER — Other Ambulatory Visit: Payer: Self-pay | Admitting: Physician Assistant

## 2015-02-12 ENCOUNTER — Other Ambulatory Visit (HOSPITAL_COMMUNITY): Payer: Self-pay | Admitting: Anesthesiology

## 2015-04-01 IMAGING — CR DG CHEST 2V
2 series · 2 of 2 positions shown · non-contrast
Comparison: 01/16/2012

CLINICAL DATA: Cough.  Shortness of breath.  Wheezing.  COPD.

EXAM:
CHEST  2 VIEW

[view not recorded (1 of 2)]
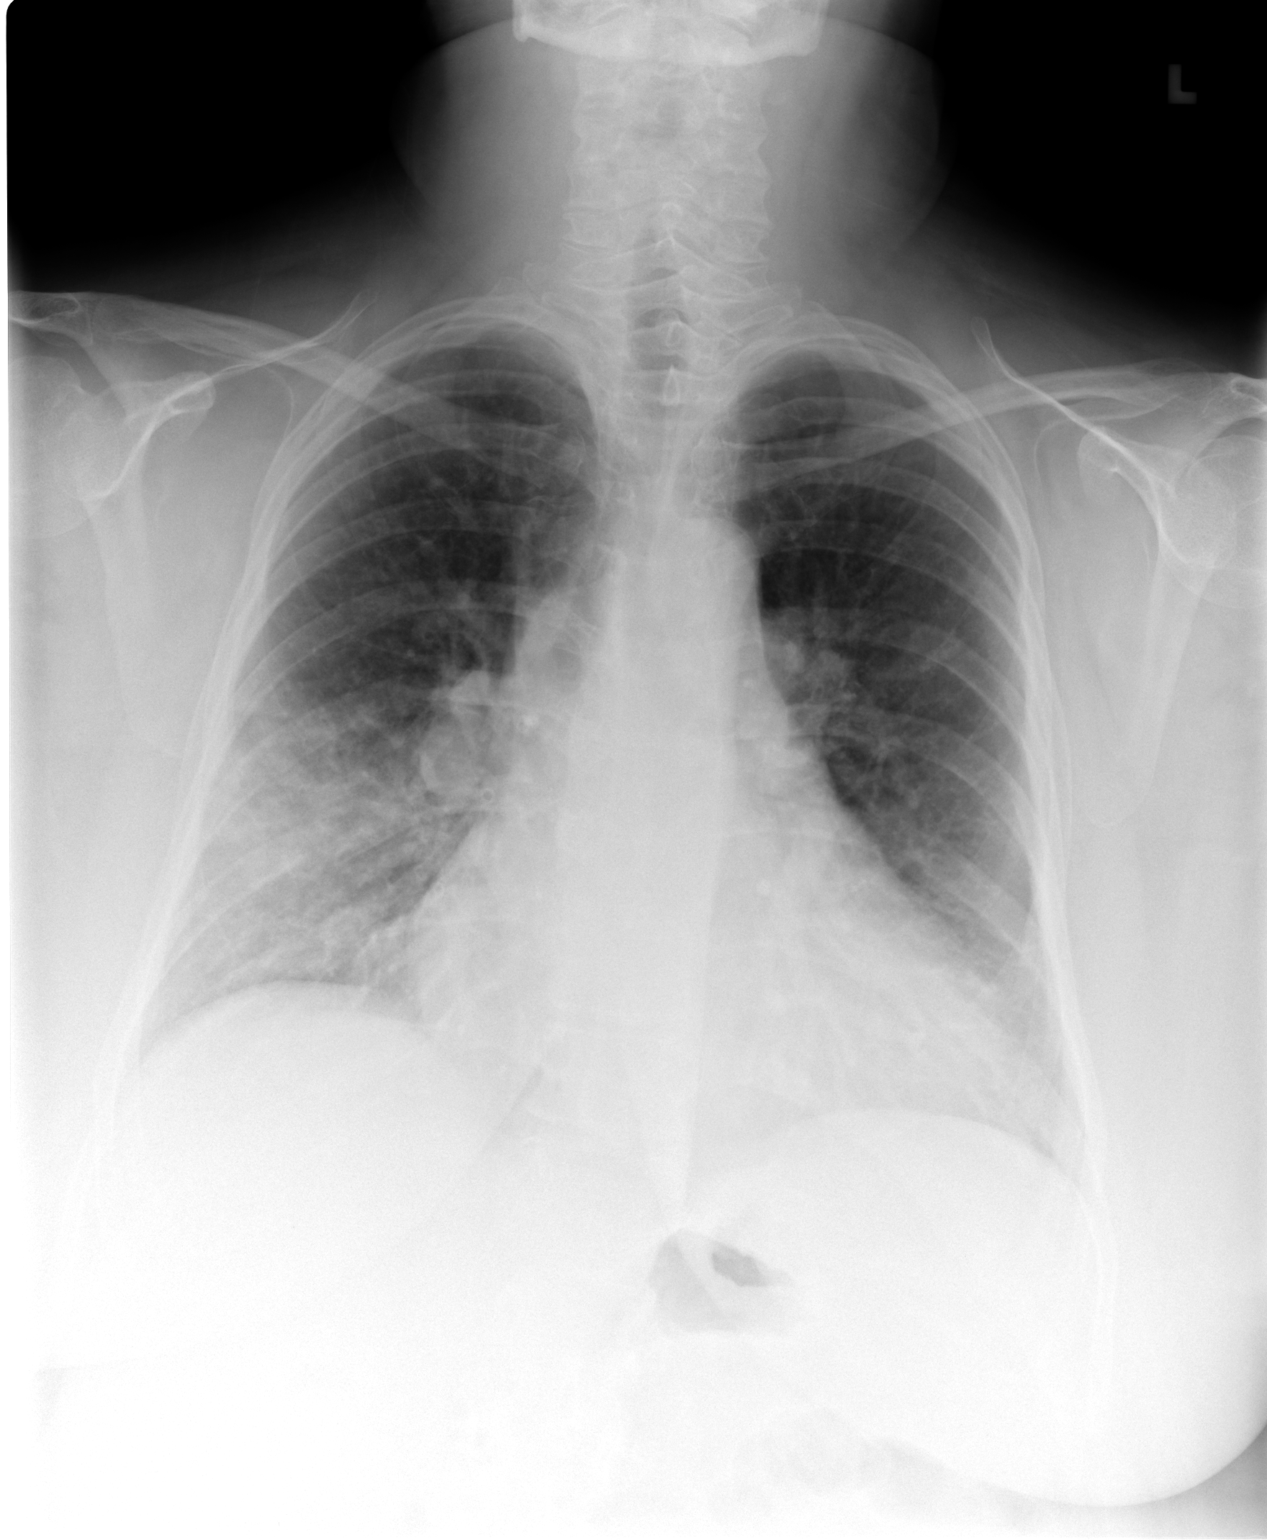

[view not recorded (2 of 2)]
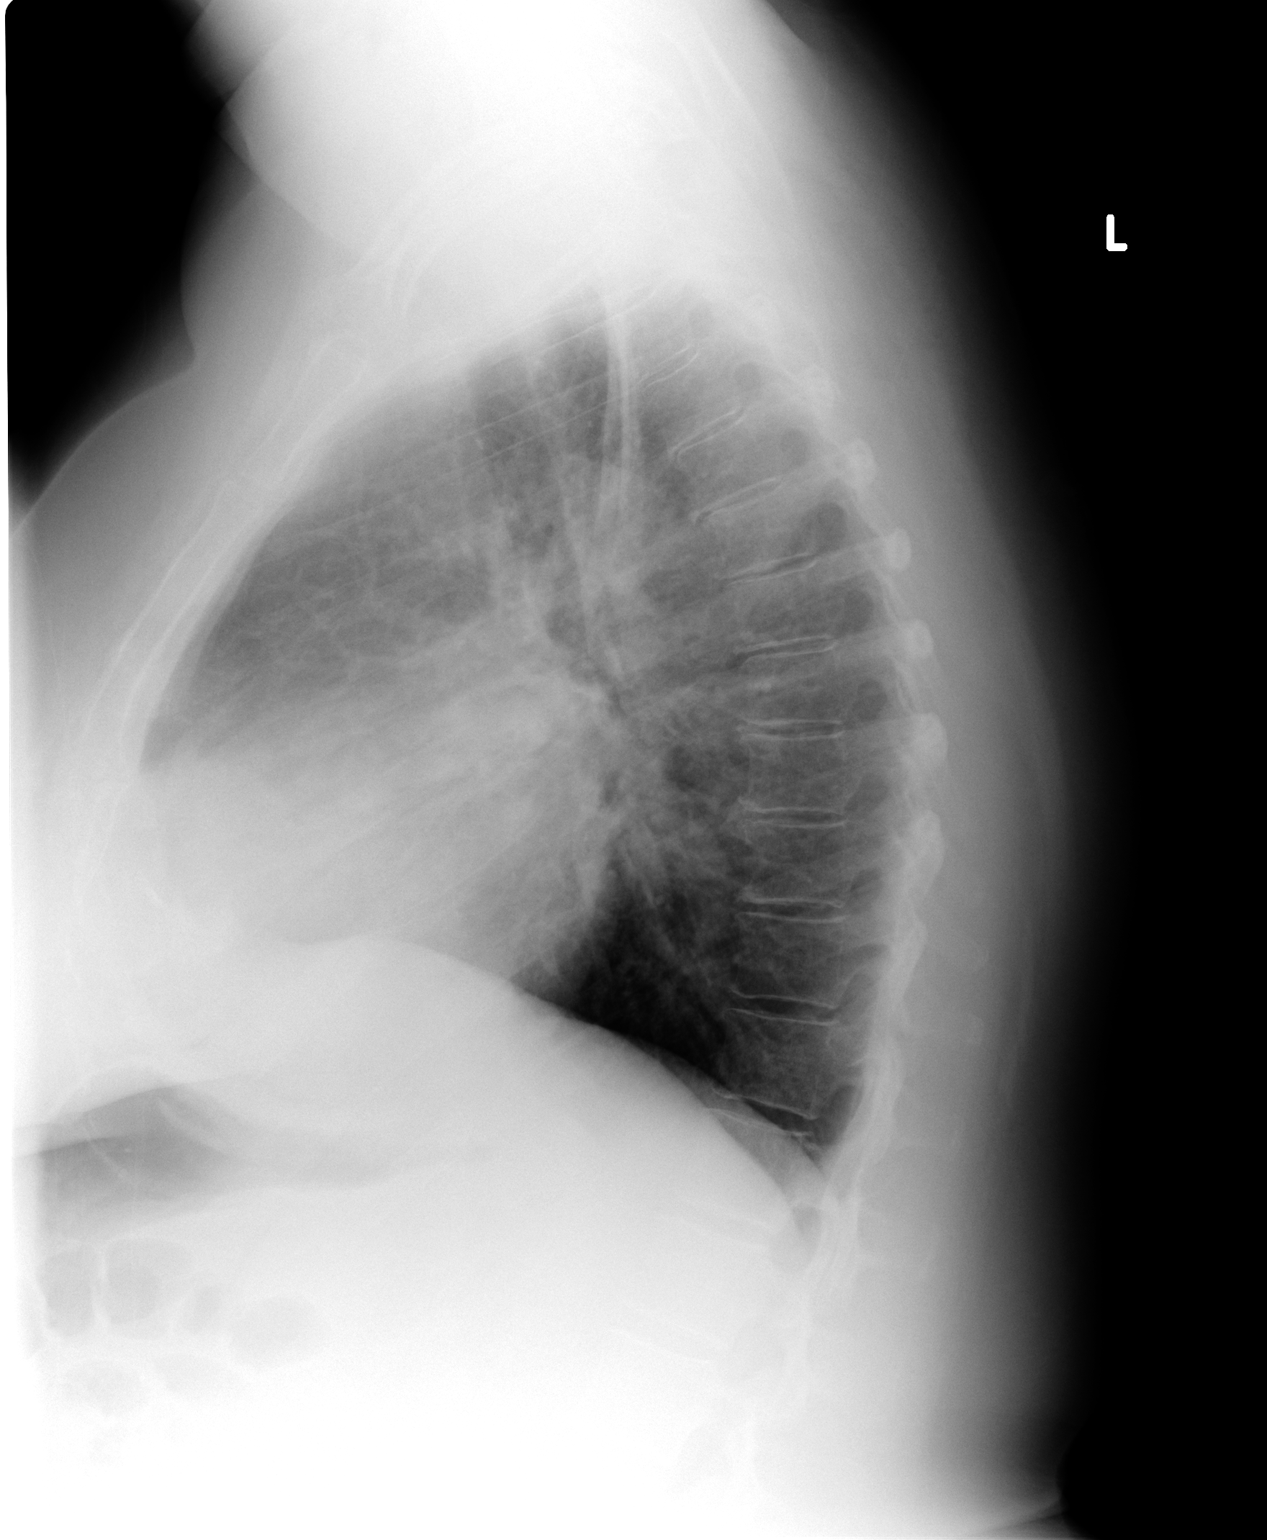

[2 of 2 positions shown; findings below may reference images not displayed]

FINDINGS: Mild cardiomegaly is stable. No evidence of congestive heart
failure.

Mild asymmetric airspace opacity is seen in the right midlung,
likely in the right middle lobe, suspicious for pneumonia. Left lung
remains clear. No evidence of pleural effusion.
IMPRESSION: Mild right middle lobe opacity, suspicious for pneumonia. Recommend
chest radiographic followup in several weeks to confirm resolution.

Stable cardiomegaly.

## 2015-04-04 ENCOUNTER — Other Ambulatory Visit (HOSPITAL_COMMUNITY): Payer: Self-pay

## 2015-04-04 DIAGNOSIS — I4891 Unspecified atrial fibrillation: Secondary | ICD-10-CM

## 2015-04-04 MED ORDER — DILTIAZEM HCL ER COATED BEADS 360 MG PO CP24: 360.0000 mg | ORAL_CAPSULE | Freq: Every day | ORAL | Status: DC

## 2015-04-18 ENCOUNTER — Other Ambulatory Visit (HOSPITAL_COMMUNITY): Payer: Self-pay | Admitting: *Deleted

## 2015-04-18 DIAGNOSIS — I4891 Unspecified atrial fibrillation: Secondary | ICD-10-CM

## 2015-04-18 MED ORDER — DILTIAZEM HCL ER COATED BEADS 360 MG PO CP24: 360.0000 mg | ORAL_CAPSULE | Freq: Every day | ORAL | Status: DC

## 2015-04-21 ENCOUNTER — Other Ambulatory Visit (HOSPITAL_COMMUNITY): Payer: Self-pay | Admitting: *Deleted

## 2015-04-22 ENCOUNTER — Ambulatory Visit (HOSPITAL_COMMUNITY)
Admission: RE | Admit: 2015-04-22 | Discharge: 2015-04-22 | Disposition: A | Discharge status: Home / Self Care | Payer: BLUE CROSS/BLUE SHIELD | Source: Ambulatory Visit | Attending: Internal Medicine | Admitting: Internal Medicine

## 2015-04-22 VITALS — BP 110/72 | HR 88 | Wt 241.6 lb

## 2015-04-22 DIAGNOSIS — Z7984 Long term (current) use of oral hypoglycemic drugs: Secondary | ICD-10-CM | POA: Diagnosis not present

## 2015-04-22 DIAGNOSIS — E669 Obesity, unspecified: Secondary | ICD-10-CM | POA: Diagnosis not present

## 2015-04-22 DIAGNOSIS — C4491 Basal cell carcinoma of skin, unspecified: Secondary | ICD-10-CM | POA: Insufficient documentation

## 2015-04-22 DIAGNOSIS — Z7901 Long term (current) use of anticoagulants: Secondary | ICD-10-CM | POA: Diagnosis not present

## 2015-04-22 DIAGNOSIS — I4891 Unspecified atrial fibrillation: Secondary | ICD-10-CM

## 2015-04-22 DIAGNOSIS — I5032 Chronic diastolic (congestive) heart failure: Secondary | ICD-10-CM

## 2015-04-22 DIAGNOSIS — R0902 Hypoxemia: Secondary | ICD-10-CM | POA: Insufficient documentation

## 2015-04-22 DIAGNOSIS — Z86711 Personal history of pulmonary embolism: Secondary | ICD-10-CM | POA: Diagnosis not present

## 2015-04-22 DIAGNOSIS — G4733 Obstructive sleep apnea (adult) (pediatric): Secondary | ICD-10-CM | POA: Insufficient documentation

## 2015-04-22 DIAGNOSIS — Z882 Allergy status to sulfonamides status: Secondary | ICD-10-CM | POA: Diagnosis not present

## 2015-04-22 DIAGNOSIS — I482 Chronic atrial fibrillation, unspecified: Secondary | ICD-10-CM

## 2015-04-22 DIAGNOSIS — F329 Major depressive disorder, single episode, unspecified: Secondary | ICD-10-CM | POA: Diagnosis not present

## 2015-04-22 DIAGNOSIS — E785 Hyperlipidemia, unspecified: Secondary | ICD-10-CM | POA: Insufficient documentation

## 2015-04-22 DIAGNOSIS — J449 Chronic obstructive pulmonary disease, unspecified: Secondary | ICD-10-CM | POA: Diagnosis not present

## 2015-04-22 DIAGNOSIS — Z9119 Patient's noncompliance with other medical treatment and regimen: Secondary | ICD-10-CM | POA: Insufficient documentation

## 2015-04-22 DIAGNOSIS — E119 Type 2 diabetes mellitus without complications: Secondary | ICD-10-CM | POA: Insufficient documentation

## 2015-04-22 DIAGNOSIS — F419 Anxiety disorder, unspecified: Secondary | ICD-10-CM | POA: Insufficient documentation

## 2015-04-22 DIAGNOSIS — I5033 Acute on chronic diastolic (congestive) heart failure: Secondary | ICD-10-CM | POA: Insufficient documentation

## 2015-04-22 DIAGNOSIS — Z888 Allergy status to other drugs, medicaments and biological substances status: Secondary | ICD-10-CM | POA: Insufficient documentation

## 2015-04-22 DIAGNOSIS — Z7951 Long term (current) use of inhaled steroids: Secondary | ICD-10-CM | POA: Insufficient documentation

## 2015-04-22 DIAGNOSIS — E039 Hypothyroidism, unspecified: Secondary | ICD-10-CM | POA: Diagnosis not present

## 2015-04-22 DIAGNOSIS — K219 Gastro-esophageal reflux disease without esophagitis: Secondary | ICD-10-CM | POA: Insufficient documentation

## 2015-04-22 DIAGNOSIS — I110 Hypertensive heart disease with heart failure: Secondary | ICD-10-CM | POA: Diagnosis not present

## 2015-04-22 DIAGNOSIS — Z79899 Other long term (current) drug therapy: Secondary | ICD-10-CM | POA: Diagnosis not present

## 2015-04-22 LAB — BASIC METABOLIC PANEL
Anion gap: 12 (ref 5–15)
BUN: 14 mg/dL (ref 6–20)
CO2: 31 mmol/L (ref 22–32)
Calcium: 9 mg/dL (ref 8.9–10.3)
Chloride: 102 mmol/L (ref 101–111)
Creatinine, Ser: 1.35 mg/dL — ABNORMAL HIGH (ref 0.44–1.00)
GFR calc Af Amer: 50 mL/min — ABNORMAL LOW (ref >60)
GFR calc non Af Amer: 43 mL/min — ABNORMAL LOW (ref >60)
Glucose, Bld: 117 mg/dL — ABNORMAL HIGH (ref 65–99)
Potassium: 3.7 mmol/L (ref 3.5–5.1)
Sodium: 145 mmol/L (ref 135–145)

## 2015-04-22 MED ORDER — RIVAROXABAN 20 MG PO TABS: 20.0000 mg | ORAL_TABLET | Freq: Every day | ORAL | Status: DC

## 2015-04-22 MED ORDER — METOLAZONE 2.5 MG PO TABS: ORAL_TABLET | ORAL | Status: DC

## 2015-04-22 MED ORDER — FUROSEMIDE 40 MG PO TABS: ORAL_TABLET | ORAL | Status: DC

## 2015-04-22 MED ORDER — POTASSIUM CHLORIDE ER 10 MEQ PO TBCR
40.0000 meq | EXTENDED_RELEASE_TABLET | Freq: Every day | ORAL | Status: DC
Start: 2015-04-22 — End: 2016-01-20

## 2015-04-22 MED ORDER — DILTIAZEM HCL ER COATED BEADS 360 MG PO CP24: 360.0000 mg | ORAL_CAPSULE | Freq: Every day | ORAL | Status: DC

## 2015-04-22 MED ORDER — BISOPROLOL FUMARATE 5 MG PO TABS: 5.0000 mg | ORAL_TABLET | Freq: Every day | ORAL | Status: DC

## 2015-04-22 NOTE — Patient Instructions (Signed)
As requested medication refills have been sent to [place] today  Your physician has requested that you have an echocardiogram. Echocardiography is a painless test that uses sound waves to create images of your heart. It provides your doctor with information about the size and shape of your heart and how well your heart's chambers and valves are working. This procedure takes approximately one hour. There are no restrictions for this procedure.  Your physician recommends that you schedule a follow-up appointment in: 6 months In the [place] following things EVERYDAY: 1) Weigh yourself in the morning before breakfast. Write it down and keep it in a log. 2) Take your medicines as prescribed 3) Eat low salt foods-Limit salt (sodium) to 2000 mg per day.  4) Stay as active as you can everyday 5) Limit all fluids for the day to less than 2 liters 6)

## 2015-04-22 NOTE — Progress Notes (Signed)
Patient ID: [name], female   DOB: 05-Aug-1960, 55 y.o.   MRN: [id number]   PCP:  Dr. Idell Pickles  Primary Cardiologist:  Dr. [name] Pulmonologist: Dr Gwenette Greet EP: Dr Rayann Heman  History of Present Illness: [name] is a 55 y.o. female with a history of chronic diastolic heart failure, chronic AF, HTN, hyperlipidemia, sleep apnea, glucose intolerance, COPD and GERD.  She was admitted 6/2-6/11/2010.  Prior to admission, she had stopped all of her medications due to difficulties with finances.  She had been developing increasing dyspnea requiring several rounds of antibiotics and prednisone taper.  She presented to the hospital with narrow complex tachycardia treated with adenosine x2.  She developed normal sinus rhythm briefly and then converted to atrial flutter with rapid ventricular rate.  Myocardial infarction was ruled out.  She continued to have intermittent episodes of atrial flutter.  She was placed on flecainide.   She was seen by EP.  Given her multiple atrial arrhythmias, ablation therapy was not felt to be helpful and she was continued on flecainide.  She had continued dyspnea and CT scan demonstrated a right lower lobe nonocclusive pulmonary embolism.  She was placed on heparin and Coumadin.    Admitted 01/14/12 with afib with RVR (NSR 09/23/10) and volume overload.  During hospitalization Underwent TEE/DCCV and DCCV  which showed no thrombus.  She failed 2 cardioversions therefore rate control was pursued. Flecainide was stopped and transitioned to cardizem and amiodarone. She was also placed on Xarelto. Discharge weight 215 pounds.  Follow up for Heart Failure: She has not been seen in over 6 months.  Recently treated for URI with antibiotics and steroids. Overall feeling ok. Mild dyspnea with exertion. Denies orthopnea/PND. She is wearing oxygen at night. No BRBPR.  Taking all medications. Working full time. Not exercising. Drinking> 2 liters.    06/17/2014 ECHO EF 55-60%  perhaps mild RV HK  Labs  07/31/12 TSH 6.06 T4 1.14  ALT 40 11/22/12 K 3.5 Creatinine 1.15 09/06/13 K 3.9 12/18/13 K 3.8 Creatinine 1.3 Pro BNP 1242  01/01/14: K 4.5, creatinine 1.29 02/05/14: K 4.1 Creatinine 1.15 07/17/2014: K 4.1 Creatinine 1.53   SH; Works full time at [employer]. Lives with her 2 sons. Separated from her husband  ROS: All pertinent positives and negatives as in HPI, otherwise negative.     Past Medical History  Diagnosis Date  . GERD (gastroesophageal reflux disease)   . Depression   . Persistent atrial fibrillation     Myoview 4/09: No ischemia;  echo 4/09: EF 60-65%;   Flecainide, beta blocker, Coumadin;    Unable to take Tikosyn 2/2 prolonged QT  . Obstructive sleep apnea   . Borderline diabetes   . Atrial flutter   . Chronic diastolic heart failure   . Pulmonary embolism     Right lower lobe diagnosed by CT 6/12  . COPD (chronic obstructive pulmonary disease)   . Hyperlipidemia   . HTN (hypertension)   . Type II or unspecified type diabetes mellitus without mention of complication, not stated as uncontrolled   . Anxiety   . Anemia     Current Outpatient Prescriptions  Medication Sig Dispense Refill  . ALPRAZolam (XANAX) 0.5 MG tablet 1/2-1 pill PRN before procedure, if need to can take 1 at one time. Do not drive if taking. 30 tablet 0  . aspirin-acetaminophen-caffeine (EXCEDRIN MIGRAINE) 160-109-32 MG per tablet Take 1 tablet by mouth every 6 (six) hours as needed for headache.    Marland Kitchen  bisoprolol (ZEBETA) 5 MG tablet Take 1 tablet (5 mg total) by mouth daily. 90 tablet 3  . diltiazem (CARDIZEM CD) 360 MG 24 hr capsule Take 1 capsule (360 mg total) by mouth daily. 90 capsule 3  . furosemide (LASIX) 40 MG tablet Take 40 mg (1 tablet) in the morning and 20 mg (1/2 tablet) in the evening. 140 tablet 3  . levothyroxine (SYNTHROID, LEVOTHROID) 100 MCG tablet TAKE 1 TABLET DAILY 30 TO 60 MINUTES BEFORE FOOD 90 tablet 3  . metFORMIN (GLUCOPHAGE XR) 500  MG 24 hr tablet Take 1 or 2 tablets once or twice daily as directed for Diabetes (Patient taking differently: Take 500 mg by mouth daily with breakfast. Take 1 or 2 tablets once or twice daily as directed for Diabetes) 360 tablet 4  . metolazone (ZAROXOLYN) 2.5 MG tablet As needed for increase in swelling 90 tablet 3  . OVER THE COUNTER MEDICATION as needed. Equate Night time sleep aid    . potassium chloride (K-DUR) 10 MEQ tablet Take 4 tablets (40 mEq total) by mouth daily. Take extra 20 meq when you take a dose of Metolazone 360 tablet 3  . rivaroxaban (XARELTO) 20 MG TABS tablet Take 1 tablet (20 mg total) by mouth daily with supper. 30 tablet 12  . sertraline (ZOLOFT) 100 MG tablet Take 1 tablet daily - Need Office Visit before further refills 30 tablet 0  . SYMBICORT 160-4.5 MCG/ACT inhaler USE 2 INHALATIONS DAILY (PATIENT NEEDS OFFICE VISIT) 30.6 g 0   No current facility-administered medications for this encounter.    Allergies: Allergies  Allergen Reactions  . Sulfonamide Derivatives Hives and Itching  . Tikosyn [Dofetilide]     Prolonged qt   Filed Vitals:   04/22/15 0917  BP: 110/72  Pulse: 88  Weight: 241 lb 9.6 oz (109.589 kg)  SpO2: 92%   PHYSICAL EXAM: General: NAD  No acute distress HEENT: normalNose black irregular mole Neck: JVD difficult to assess appears somewhat elevated Cardiac: Irregular rate and  rhythm; normal S1, S2; no murmur Lungs:  Clear. no wheezing, rhonchi or rales  Abd: obese. soft, nontender, nondistended. No hepatomegaly Ext: R and LLE no edema.   Neuro:  CNs 2-12 intact, no focal abnormalities noted  ASSESSMENT AND PLAN:  1) Acute on chronic diastolic HF:  EF 44%, mod MR (01/2012), echo today EF 55-60% moderate MR - NYHA III symptoms. Functionally doing ok. Continue current dose of lasix 40 mg/20 mg daily. Continue metolazone as needed.     Reinforced the need and importance of daily weights, a low sodium diet, and fluid restriction (less  than 2 L a day). Instructed to call the HF clinic if weight increases more than 3 lbs overnight or 5 lbs in a week.  2) Obesity- Needs to lose weight. Encouraged to cut back portions and be more active.  3) Hyperlipidemia-- Managed by PCP 4) Chronic Atrial Fibrillation.-- [place] Vasc Score 3.  Controlled rate. Off amio.Continue  bisoprolol 5 mg daily.  Continue diltiazem. Continue Xarelto. 5) DM- Continue metoformin. Per PCP.   6) Hypothyroid-  Taking synthyroid.  Followed by PCP. 7) Hypoxia-  O2 sats stable today . Continue oxygen at night. 8) Noncompliance-  Doing much better.  Taking medications 9) Basal cell carcinoma-  Biopsied 01/2014. Needs to have Mohs. She is deferring this due to cost.   Check BMET today.  Follow up 6 months   Robin Heagle NP-C 9:19 AM

## 2016-01-05 ENCOUNTER — Telehealth (HOSPITAL_COMMUNITY): Payer: Self-pay | Admitting: Cardiology

## 2016-01-05 NOTE — Telephone Encounter (Signed)
Patient left voicemail on 01/02/16 with concerns regarding fluid vs cold and congestion. Patient reports she had cough and congestion for a few days, weight was up some, no additional swelling. Pt took some robitussin, Mucinex, and metolazone. Reports she is feeling a lot better. Requests to schedule follow up (01/16/16 @ 3)

## 2016-01-16 ENCOUNTER — Inpatient Hospital Stay (HOSPITAL_COMMUNITY): Admission: RE | Admit: 2016-01-16 | Payer: BLUE CROSS/BLUE SHIELD | Source: Ambulatory Visit

## 2016-01-20 ENCOUNTER — Ambulatory Visit (HOSPITAL_COMMUNITY)
Admission: RE | Admit: 2016-01-20 | Discharge: 2016-01-20 | Disposition: A | Discharge status: Home / Self Care | Payer: BLUE CROSS/BLUE SHIELD | Source: Ambulatory Visit | Attending: Cardiology | Admitting: Cardiology

## 2016-01-20 VITALS — BP 112/80 | HR 84 | Wt 239.2 lb

## 2016-01-20 DIAGNOSIS — E785 Hyperlipidemia, unspecified: Secondary | ICD-10-CM | POA: Diagnosis not present

## 2016-01-20 DIAGNOSIS — E669 Obesity, unspecified: Secondary | ICD-10-CM | POA: Diagnosis not present

## 2016-01-20 DIAGNOSIS — Z85828 Personal history of other malignant neoplasm of skin: Secondary | ICD-10-CM | POA: Diagnosis not present

## 2016-01-20 DIAGNOSIS — E119 Type 2 diabetes mellitus without complications: Secondary | ICD-10-CM | POA: Insufficient documentation

## 2016-01-20 DIAGNOSIS — E039 Hypothyroidism, unspecified: Secondary | ICD-10-CM | POA: Diagnosis not present

## 2016-01-20 DIAGNOSIS — K219 Gastro-esophageal reflux disease without esophagitis: Secondary | ICD-10-CM | POA: Diagnosis not present

## 2016-01-20 DIAGNOSIS — Z91199 Patient's noncompliance with other medical treatment and regimen due to unspecified reason: Secondary | ICD-10-CM

## 2016-01-20 DIAGNOSIS — Z7901 Long term (current) use of anticoagulants: Secondary | ICD-10-CM | POA: Insufficient documentation

## 2016-01-20 DIAGNOSIS — I5032 Chronic diastolic (congestive) heart failure: Secondary | ICD-10-CM

## 2016-01-20 DIAGNOSIS — I110 Hypertensive heart disease with heart failure: Secondary | ICD-10-CM | POA: Insufficient documentation

## 2016-01-20 DIAGNOSIS — G4733 Obstructive sleep apnea (adult) (pediatric): Secondary | ICD-10-CM | POA: Diagnosis not present

## 2016-01-20 DIAGNOSIS — Z7982 Long term (current) use of aspirin: Secondary | ICD-10-CM | POA: Insufficient documentation

## 2016-01-20 DIAGNOSIS — Z9119 Patient's noncompliance with other medical treatment and regimen: Secondary | ICD-10-CM | POA: Insufficient documentation

## 2016-01-20 DIAGNOSIS — Z79899 Other long term (current) drug therapy: Secondary | ICD-10-CM | POA: Insufficient documentation

## 2016-01-20 DIAGNOSIS — Z86711 Personal history of pulmonary embolism: Secondary | ICD-10-CM | POA: Diagnosis not present

## 2016-01-20 DIAGNOSIS — Z7984 Long term (current) use of oral hypoglycemic drugs: Secondary | ICD-10-CM | POA: Diagnosis not present

## 2016-01-20 DIAGNOSIS — Z882 Allergy status to sulfonamides status: Secondary | ICD-10-CM | POA: Diagnosis not present

## 2016-01-20 DIAGNOSIS — I482 Chronic atrial fibrillation, unspecified: Secondary | ICD-10-CM

## 2016-01-20 DIAGNOSIS — I159 Secondary hypertension, unspecified: Secondary | ICD-10-CM

## 2016-01-20 DIAGNOSIS — J449 Chronic obstructive pulmonary disease, unspecified: Secondary | ICD-10-CM | POA: Diagnosis not present

## 2016-01-20 DIAGNOSIS — E038 Other specified hypothyroidism: Secondary | ICD-10-CM

## 2016-01-20 LAB — BASIC METABOLIC PANEL
Anion gap: 12 (ref 5–15)
BUN: 20 mg/dL (ref 6–20)
CO2: 33 mmol/L — ABNORMAL HIGH (ref 22–32)
Calcium: 8.7 mg/dL — ABNORMAL LOW (ref 8.9–10.3)
Chloride: 97 mmol/L — ABNORMAL LOW (ref 101–111)
Creatinine, Ser: 1.65 mg/dL — ABNORMAL HIGH (ref 0.44–1.00)
GFR calc Af Amer: 39 mL/min — ABNORMAL LOW (ref >60)
GFR calc non Af Amer: 33 mL/min — ABNORMAL LOW (ref >60)
Glucose, Bld: 94 mg/dL (ref 65–99)
Potassium: 3.2 mmol/L — ABNORMAL LOW (ref 3.5–5.1)
Sodium: 142 mmol/L (ref 135–145)

## 2016-01-20 LAB — BRAIN NATRIURETIC PEPTIDE: B Natriuretic Peptide: 138.5 pg/mL — ABNORMAL HIGH (ref 0.0–100.0)

## 2016-01-20 NOTE — Progress Notes (Signed)
Patient ID: [name], female   DOB: 11-Apr-1960, 55 y.o.   MRN: [id number]   PCP:  Dr. Idell Pickles  Primary Cardiologist:  Dr. [name] Pulmonologist: Dr Gwenette Greet EP: Dr Rayann Heman  History of Present Illness: [name] is a 55 y.o. female with a history of chronic diastolic heart failure, chronic AF, HTN, hyperlipidemia, sleep apnea, glucose intolerance, COPD and GERD.  She was admitted 6/2-6/11/2010.  Prior to admission, she had stopped all of her medications due to difficulties with finances.  She had been developing increasing dyspnea requiring several rounds of antibiotics and prednisone taper.  She presented to the hospital with narrow complex tachycardia treated with adenosine x2.  She developed normal sinus rhythm briefly and then converted to atrial flutter with rapid ventricular rate.  Myocardial infarction was ruled out.  She continued to have intermittent episodes of atrial flutter.  She was placed on flecainide.   She was seen by EP.  Given her multiple atrial arrhythmias, ablation therapy was not felt to be helpful and she was continued on flecainide.  She had continued dyspnea and CT scan demonstrated a right lower lobe nonocclusive pulmonary embolism.  She was placed on heparin and Coumadin.    Admitted 01/14/12 with afib with RVR (NSR 09/23/10) and volume overload.  During hospitalization Underwent TEE/DCCV and DCCV  which showed no thrombus.  She failed 2 cardioversions therefore rate control was pursued. Flecainide was stopped and transitioned to cardizem and amiodarone. She was also placed on Xarelto. Discharge weight 215 pounds.  She presents today for 6 month follow up.  Has been adjusting potassium and lasix as she wants to.  Usually taking at least lasix 40 mg daily. At times skipping, and at other times taking an extra 40 mg to  "catch up". Has occasional URIs and takes OTC treatment for.  Overall she states she feels OK. Breathing has been OK. Eats fast food often.  Stove at her house hasn't been working. Some SOB with showering or changing clothes. Does not exercise. Wears 02 every night.  Drinking more than 2 liters daily.  Works full time at [employer], sedentary most of the time.    06/17/2014 ECHO EF 55-60% perhaps mild RV HK  Labs  07/31/12 TSH 6.06 T4 1.14  ALT 40 11/22/12 K 3.5 Creatinine 1.15 09/06/13 K 3.9 12/18/13 K 3.8 Creatinine 1.3 Pro BNP 1242  01/01/14: K 4.5, creatinine 1.29 02/05/14: K 4.1 Creatinine 1.15 07/17/2014: K 4.1 Creatinine 1.53   SH; Works full time at [employer]. Lives with her 2 sons. Separated from her husband  ROS: All pertinent positives and negatives as in HPI, otherwise negative.     Past Medical History:  Diagnosis Date  . Anemia   . Anxiety   . Atrial flutter   . Borderline diabetes   . Chronic diastolic heart failure   . COPD (chronic obstructive pulmonary disease)   . Depression   . GERD (gastroesophageal reflux disease)   . HTN (hypertension)   . Hyperlipidemia   . Obstructive sleep apnea   . Persistent atrial fibrillation    Myoview 4/09: No ischemia;  echo 4/09: EF 60-65%;   Flecainide, beta blocker, Coumadin;    Unable to take Tikosyn 2/2 prolonged QT  . Pulmonary embolism    Right lower lobe diagnosed by CT 6/12  . Type II or unspecified type diabetes mellitus without mention of complication, not stated as uncontrolled     Current Outpatient Prescriptions  Medication Sig Dispense Refill  .  ALPRAZolam (XANAX) 0.5 MG tablet 1/2-1 pill PRN before procedure, if need to can take 1 at one time. Do not drive if taking. 30 tablet 0  . aspirin-acetaminophen-caffeine (EXCEDRIN MIGRAINE) 161-096-04 MG per tablet Take 1 tablet by mouth every 6 (six) hours as needed for headache.    . bisoprolol (ZEBETA) 5 MG tablet Take 1 tablet (5 mg total) by mouth daily. 90 tablet 3  . diltiazem (CARDIZEM CD) 360 MG 24 hr capsule Take 1 capsule (360 mg total) by mouth daily. 90 capsule 3  . furosemide  (LASIX) 40 MG tablet Take 40 mg by mouth daily. Take an additional 40 mg tablet for weight gain/leg swelling    . metFORMIN (GLUCOPHAGE-XR) 500 MG 24 hr tablet Take 500 mg by mouth daily with breakfast.    . metolazone (ZAROXOLYN) 2.5 MG tablet As needed for increase in swelling 90 tablet 3  . naproxen sodium (ANAPROX) 220 MG tablet Take 220 mg by mouth 2 (two) times daily as needed (back pain).    Marland Kitchen OVER THE COUNTER MEDICATION as needed. Equate Night time sleep aid    . potassium chloride (K-DUR,KLOR-CON) 10 MEQ tablet Take 20 mEq by mouth daily. Take additional 20 meq (2 tablets) if take additional furosemide 40 mg tablet    . rivaroxaban (XARELTO) 20 MG TABS tablet Take 1 tablet (20 mg total) by mouth daily with supper. 90 tablet 3   No current facility-administered medications for this encounter.     Allergies: Allergies  Allergen Reactions  . Sulfonamide Derivatives Hives and Itching  . Tikosyn [Dofetilide]     Prolonged qt   Vitals:   01/20/16 1529  BP: 112/80  BP Location: Left Arm  Patient Position: Sitting  Cuff Size: Normal  Pulse: 84  SpO2: (!) 89%  Weight: 239 lb 3.2 oz (108.5 kg)   Wt Readings from Last 3 Encounters:  01/20/16 239 lb 3.2 oz (108.5 kg)  04/22/15 241 lb 9.6 oz (109.6 kg)  07/01/14 232 lb (105.2 kg)    PHYSICAL EXAM: General: Pleasant, NAD. No resp difficulty Psych: Normal affect. HEENT: Normal, Black irregular mole on L side of nose. Unchanged per patient.  Neck: Supple, no bruits or JVD. Carotids 2+. No lymphadenopathy/thyromegaly appreciated. Heart: PMI nondisplaced. RRR no s3, s4, or murmurs. Lungs: Resp regular and unlabored, CTA. Abdomen: Soft, non-tender, non-distended, No HSM, BS + x 4.  Extremities: No clubbing or cyanosis. DP/PT/Radials 2+ and equal bilaterally. 1+ edema BLEs Neuro: Alert and oriented X 3. Moves all extremities spontaneously.   ASSESSMENT AND PLAN:  1) Acute on chronic diastolic HF:  EF 54%, mod MR (01/2012),  echo today EF 55-60% moderate MR - NYHA III symptoms. - Volume status at least mildly elevated in setting of dietary indiscretion and irregular lasix administration.   - take lasix 40 mg BID x 2 days, then return to 40 mg daily. Currently she states she misses 2-3 doses a week.  - Take potassium 20 meq daily, extra 20 meq on extra lasix days.  - Can use 2.5 mg metolazone as needed, but encouraged to try extra lasix first.   - Reinforced fluid restriction to < 2 L daily, sodium restriction to less than 2000 mg daily, and the importance of daily weights.  2) Obesity - Encouraged to cut portions and increase activity as able.   3) Hyperlipidemia - Per PCP 4) Chronic Atrial Fibrillation.-- [place] Vasc Score 3.  - Rate controlled. Asymptomatic. - off amiodarone.  - Continue bisoprolol 5  mg daily.  - Continue diltiazem 360 mg daily.  - Continue Xarelto 20 mg daily. 5) DM- Continue metoformin. Per PCP.   6) Hypothyroid- - Per PCP.  7) Hypoxia - 02 stats usually stable. Continue 02 at night.  OK to use with exertion prn.  Should follow sats at home.  8) Noncompliance - Continues to struggle with regular dosing of her meds. Long discussion today.  9) Basal cell carcinoma-  Biopsied 01/2014. Needs to have Mohs. She is deferring this due to cost.   BMET/BNP today. Repeat 10-14 days with extra lasix. Follow up 6 weeks to re-assess.   Shirley Friar, PA-C 3:49 PM  Total time spent > 25 minutes. Over half that spent discussing the above.

## 2016-01-20 NOTE — Patient Instructions (Addendum)
Routine lab work today. Will notify you of abnormal results, otherwise no news is good news!  Return in 1-2 weeks for repeat labs.  Take 40 mg tablet TWICE daily for TWO DAYS with 2 extra potassium tablets.  Follow up 6 weeks with Robin Kilts PA-C.  Do the following things EVERYDAY: 1) Weigh yourself in the morning before breakfast. Write it down and keep it in a log. 2) Take your medicines as prescribed 3) Eat low salt foods-Limit salt (sodium) to 2000 mg per day.  4) Stay as active as you can everyday 5) Limit all fluids for the day to less than 2 liters

## 2016-01-26 ENCOUNTER — Telehealth (HOSPITAL_COMMUNITY): Payer: Self-pay

## 2016-01-26 ENCOUNTER — Encounter (HOSPITAL_COMMUNITY): Payer: Self-pay

## 2016-01-26 NOTE — Telephone Encounter (Signed)
LVMTCB regarding last week's lab results and medication changes. (2nd attempt, 1st attempt 12/14).

## 2016-01-26 NOTE — Telephone Encounter (Signed)
[name]  Order: [id number]  Status:  Final result Visible to patient:  Yes (MyChart)  Notes Recorded by Effie Berkshire, RN on 01/26/2016 at 4:34 PM EST LVMTCB. 3rd attempt. Letter mailed to address provided in chart. ------  Notes Recorded by Effie Berkshire, RN on 01/26/2016 at 8:59 AM EST LVMTCB ------  Notes Recorded by Effie Berkshire, RN on 01/22/2016 at 9:27 AM EST LVMTCB. ------  Notes Recorded by Shirley Friar, PA-C on 01/21/2016 at 7:43 PM EST Please try and call 01/22/16.  Thanks.   [name] [address]" [place], PA-C 01/21/2016 7:42 PM ------  Notes Recorded by Shirley Friar, PA-C on 01/20/2016 at 8:13 PM EST Needs to increase potassium to 40 meq daily.   Should already have repeat labs scheduled.    Beryle Beams" [place], PA-C 01/20/2016 8:13 PM   Ref Range & Units 6d ago  B Natriuretic Medina 0.0 - 100.0 pg/mL 138.5

## 2016-01-29 ENCOUNTER — Telehealth (HOSPITAL_COMMUNITY): Payer: Self-pay | Admitting: *Deleted

## 2016-01-29 NOTE — Telephone Encounter (Signed)
Patient returned Megan's call regarding the following lab results: She agrees and understands to increase her K-DUR to 40 mEq Daily and will come back for lab checks as scheduled next week.  Notes Recorded by Shirley Friar, PA-C on 01/20/2016 at 8:13 PM EST Needs to increase potassium to 40 meq daily.   Should already have repeat labs scheduled.    Legrand Como [address] [place], [place] 01/20/2016 8:13 PM

## 2016-02-05 ENCOUNTER — Ambulatory Visit (HOSPITAL_COMMUNITY)
Admission: RE | Admit: 2016-02-05 | Discharge: 2016-02-05 | Disposition: A | Discharge status: Home / Self Care | Payer: BLUE CROSS/BLUE SHIELD | Source: Ambulatory Visit | Attending: Cardiology | Admitting: Cardiology

## 2016-02-05 ENCOUNTER — Other Ambulatory Visit (HOSPITAL_COMMUNITY): Payer: Self-pay | Admitting: *Deleted

## 2016-02-05 DIAGNOSIS — I5032 Chronic diastolic (congestive) heart failure: Secondary | ICD-10-CM

## 2016-02-05 LAB — BASIC METABOLIC PANEL
Anion gap: 12 (ref 5–15)
BUN: 16 mg/dL (ref 6–20)
CO2: 34 mmol/L — ABNORMAL HIGH (ref 22–32)
Calcium: 9.8 mg/dL (ref 8.9–10.3)
Chloride: 96 mmol/L — ABNORMAL LOW (ref 101–111)
Creatinine, Ser: 1.28 mg/dL — ABNORMAL HIGH (ref 0.44–1.00)
GFR calc Af Amer: 53 mL/min — ABNORMAL LOW (ref >60)
GFR calc non Af Amer: 45 mL/min — ABNORMAL LOW (ref >60)
Glucose, Bld: 89 mg/dL (ref 65–99)
Potassium: 3.7 mmol/L (ref 3.5–5.1)
Sodium: 142 mmol/L (ref 135–145)

## 2016-02-05 LAB — BRAIN NATRIURETIC PEPTIDE: B Natriuretic Peptide: 212.1 pg/mL — ABNORMAL HIGH (ref 0.0–100.0)

## 2016-03-02 ENCOUNTER — Ambulatory Visit (HOSPITAL_COMMUNITY)
Admission: RE | Admit: 2016-03-02 | Discharge: 2016-03-02 | Disposition: A | Discharge status: Home / Self Care | Payer: BLUE CROSS/BLUE SHIELD | Source: Ambulatory Visit | Attending: Cardiology | Admitting: Cardiology

## 2016-03-02 VITALS — BP 100/66 | HR 86 | Wt 241.5 lb

## 2016-03-02 DIAGNOSIS — E785 Hyperlipidemia, unspecified: Secondary | ICD-10-CM | POA: Diagnosis not present

## 2016-03-02 DIAGNOSIS — Z85828 Personal history of other malignant neoplasm of skin: Secondary | ICD-10-CM | POA: Diagnosis not present

## 2016-03-02 DIAGNOSIS — I110 Hypertensive heart disease with heart failure: Secondary | ICD-10-CM | POA: Insufficient documentation

## 2016-03-02 DIAGNOSIS — Z9114 Patient's other noncompliance with medication regimen: Secondary | ICD-10-CM | POA: Diagnosis not present

## 2016-03-02 DIAGNOSIS — Z79899 Other long term (current) drug therapy: Secondary | ICD-10-CM | POA: Insufficient documentation

## 2016-03-02 DIAGNOSIS — Z882 Allergy status to sulfonamides status: Secondary | ICD-10-CM | POA: Diagnosis not present

## 2016-03-02 DIAGNOSIS — I482 Chronic atrial fibrillation, unspecified: Secondary | ICD-10-CM

## 2016-03-02 DIAGNOSIS — I159 Secondary hypertension, unspecified: Secondary | ICD-10-CM

## 2016-03-02 DIAGNOSIS — E119 Type 2 diabetes mellitus without complications: Secondary | ICD-10-CM | POA: Insufficient documentation

## 2016-03-02 DIAGNOSIS — G4730 Sleep apnea, unspecified: Secondary | ICD-10-CM | POA: Insufficient documentation

## 2016-03-02 DIAGNOSIS — J449 Chronic obstructive pulmonary disease, unspecified: Secondary | ICD-10-CM | POA: Insufficient documentation

## 2016-03-02 DIAGNOSIS — Z86711 Personal history of pulmonary embolism: Secondary | ICD-10-CM | POA: Diagnosis not present

## 2016-03-02 DIAGNOSIS — K219 Gastro-esophageal reflux disease without esophagitis: Secondary | ICD-10-CM | POA: Diagnosis not present

## 2016-03-02 DIAGNOSIS — E669 Obesity, unspecified: Secondary | ICD-10-CM | POA: Diagnosis not present

## 2016-03-02 DIAGNOSIS — I5032 Chronic diastolic (congestive) heart failure: Secondary | ICD-10-CM | POA: Diagnosis not present

## 2016-03-02 DIAGNOSIS — Z7901 Long term (current) use of anticoagulants: Secondary | ICD-10-CM | POA: Diagnosis not present

## 2016-03-02 DIAGNOSIS — E039 Hypothyroidism, unspecified: Secondary | ICD-10-CM | POA: Insufficient documentation

## 2016-03-02 DIAGNOSIS — Z7984 Long term (current) use of oral hypoglycemic drugs: Secondary | ICD-10-CM | POA: Diagnosis not present

## 2016-03-02 LAB — BASIC METABOLIC PANEL
Anion gap: 11 (ref 5–15)
BUN: 19 mg/dL (ref 6–20)
CO2: 33 mmol/L — ABNORMAL HIGH (ref 22–32)
Calcium: 9.1 mg/dL (ref 8.9–10.3)
Chloride: 99 mmol/L — ABNORMAL LOW (ref 101–111)
Creatinine, Ser: 1.5 mg/dL — ABNORMAL HIGH (ref 0.44–1.00)
GFR calc Af Amer: 44 mL/min — ABNORMAL LOW (ref >60)
GFR calc non Af Amer: 38 mL/min — ABNORMAL LOW (ref >60)
Glucose, Bld: 127 mg/dL — ABNORMAL HIGH (ref 65–99)
Potassium: 3.4 mmol/L — ABNORMAL LOW (ref 3.5–5.1)
Sodium: 143 mmol/L (ref 135–145)

## 2016-03-02 LAB — BRAIN NATRIURETIC PEPTIDE: B Natriuretic Peptide: 265.1 pg/mL — ABNORMAL HIGH (ref 0.0–100.0)

## 2016-03-02 NOTE — Patient Instructions (Addendum)
INCREASE Lasix to 40 mg, twice a day for 2 days  Labs today We will only contact you if something comes back abnormal or we need to make some changes. Otherwise no news is good news!   Your physician has requested that you have an echocardiogram. Echocardiography is a painless test that uses sound waves to create images of your heart. It provides your doctor with information about the size and shape of your heart and how well your heart's chambers and valves are working. This procedure takes approximately one hour. There are no restrictions for this procedure.  Your physician recommends that you schedule a follow-up appointment in: 6 weeks with Dr Haroldine Laws  Do the following things EVERYDAY: 1) Weigh yourself in the morning before breakfast. Write it down and keep it in a log. 2) Take your medicines as prescribed 3) Eat low salt foods-Limit salt (sodium) to 2000 mg per day.  4) Stay as active as you can everyday 5) Limit all fluids for the day to less than 2 liters

## 2016-03-02 NOTE — Progress Notes (Signed)
Patient ID: [name], female   DOB: 1960/12/24, 56 y.o.   MRN: [id number]   PCP:  Dr. Idell Pickles  Primary Cardiologist:  Dr. [name] Pulmonologist: Dr Gwenette Greet EP: Dr Rayann Heman  History of Present Illness: [name] is a 56 y.o. female with a history of chronic diastolic heart failure, chronic AF, HTN, hyperlipidemia, sleep apnea, glucose intolerance, COPD and GERD.  She was admitted 6/2-6/11/2010.  Prior to admission, she had stopped all of her medications due to difficulties with finances.  She had been developing increasing dyspnea requiring several rounds of antibiotics and prednisone taper.  She presented to the hospital with narrow complex tachycardia treated with adenosine x2.  She developed normal sinus rhythm briefly and then converted to atrial flutter with rapid ventricular rate.  Myocardial infarction was ruled out.  She continued to have intermittent episodes of atrial flutter.  She was placed on flecainide.   She was seen by EP.  Given her multiple atrial arrhythmias, ablation therapy was not felt to be helpful and she was continued on flecainide.  She had continued dyspnea and CT scan demonstrated a right lower lobe nonocclusive pulmonary embolism.  She was placed on heparin and Coumadin.    Admitted 01/14/12 with afib with RVR (NSR 09/23/10) and volume overload.  During hospitalization Underwent TEE/DCCV and DCCV  which showed no thrombus.  She failed 2 cardioversions therefore rate control was pursued. Flecainide was stopped and transitioned to cardizem and amiodarone. She was also placed on Xarelto. Discharge weight 215 pounds.  She presents today for follow up.  At last visit had been taking lasix and potassium based on how she felt.  Instructed to take extra for 2 days and then stick to prescribed regimen with volume overload. Has been taking all medication as directed.  For lunch had [employer] breast (1190 mg sodium) and salted the meat. Also had home made mac and cheese.  (these were left over from last night, which she had the same thing + slaw + potatoes and gravy). Still having some SOB with exertion, and sometimes with changing clothes and bathing.  Having a lot of anxiety from work.  Drinks > 2 L daily.    06/17/2014 ECHO EF 55-60% perhaps mild RV HK  Labs  07/31/12 TSH 6.06 T4 1.14  ALT 40 11/22/12 K 3.5 Creatinine 1.15 09/06/13 K 3.9 12/18/13 K 3.8 Creatinine 1.3 Pro BNP 1242  01/01/14: K 4.5, creatinine 1.29 02/05/14: K 4.1 Creatinine 1.15 07/17/2014: K 4.1 Creatinine 1.53   SH; Works full time at [employer]. Lives with her 2 sons. Separated from her husband  ROS: All pertinent positives and negatives as in HPI, otherwise negative.     Past Medical History:  Diagnosis Date  . Anemia   . Anxiety   . Atrial flutter   . Borderline diabetes   . Chronic diastolic heart failure   . COPD (chronic obstructive pulmonary disease)   . Depression   . GERD (gastroesophageal reflux disease)   . HTN (hypertension)   . Hyperlipidemia   . Obstructive sleep apnea   . Persistent atrial fibrillation    Myoview 4/09: No ischemia;  echo 4/09: EF 60-65%;   Flecainide, beta blocker, Coumadin;    Unable to take Tikosyn 2/2 prolonged QT  . Pulmonary embolism    Right lower lobe diagnosed by CT 6/12  . Type II or unspecified type diabetes mellitus without mention of complication, not stated as uncontrolled     Current Outpatient Prescriptions  Medication Sig Dispense Refill  . ALPRAZolam (XANAX) 0.5 MG tablet 1/2-1 pill PRN before procedure, if need to can take 1 at one time. Do not drive if taking. 30 tablet 0  . aspirin-acetaminophen-caffeine (EXCEDRIN MIGRAINE) 222-979-89 MG per tablet Take 1 tablet by mouth every 6 (six) hours as needed for headache.    . bisoprolol (ZEBETA) 5 MG tablet Take 1 tablet (5 mg total) by mouth daily. 90 tablet 3  . diltiazem (CARDIZEM CD) 360 MG 24 hr capsule Take 1 capsule (360 mg total) by mouth daily. 90 capsule 3  .  furosemide (LASIX) 40 MG tablet Take 40 mg by mouth daily. Take an additional 40 mg tablet for weight gain/leg swelling    . metolazone (ZAROXOLYN) 2.5 MG tablet As needed for increase in swelling 90 tablet 3  . naproxen sodium (ANAPROX) 220 MG tablet Take 220 mg by mouth 2 (two) times daily as needed (back pain).    Marland Kitchen OVER THE COUNTER MEDICATION as needed. Equate Night time sleep aid    . OVER THE COUNTER MEDICATION Take 100 mg by mouth at bedtime. Equate sleep medicine    . potassium chloride (K-DUR,KLOR-CON) 10 MEQ tablet Take 20 mEq by mouth daily. Take additional 20 meq (2 tablets) if take additional furosemide 40 mg tablet    . rivaroxaban (XARELTO) 20 MG TABS tablet Take 1 tablet (20 mg total) by mouth daily with supper. 90 tablet 3   No current facility-administered medications for this encounter.     Allergies: Allergies  Allergen Reactions  . Sulfonamide Derivatives Hives and Itching  . Tikosyn [Dofetilide]     Prolonged qt   Vitals:   03/02/16 1425  BP: 100/66  BP Location: Left Arm  Patient Position: Sitting  Cuff Size: Large  Pulse: 86  SpO2: 91%  Weight: 241 lb 8 oz (109.5 kg)   Wt Readings from Last 3 Encounters:  03/02/16 241 lb 8 oz (109.5 kg)  01/20/16 239 lb 3.2 oz (108.5 kg)  04/22/15 241 lb 9.6 oz (109.6 kg)    PHYSICAL EXAM: General: Morbidly obese, NAD.  Psych: Normal affect. HEENT: Normal, Black irregular mole on L side of nose. Unchanged per patient.  Neck: Supple, no bruits. JVP 8-9 cm at least. Carotids 2+. No lymphadenopathy/thyromegaly appreciated. Heart: PMI nondisplaced. RRR no s3, s4, or murmurs. Lungs: Clear, normal effort. Abdomen: Soft, non-tender, non-distended, No HSM, BS + x 4.  Extremities: No clubbing or cyanosis. DP/PT/Radials 2+ and equal bilaterally. 1+ edema BLEs Neuro: Alert and oriented X 3. Moves all extremities spontaneously.   ASSESSMENT AND PLAN:  1) Chronic diastolic HF:  EF 21%, mod MR (01/2012) - Echo 06/17/14 EF  55-60% moderate MR - NYHA III-IIIb symptoms. - Volume status somewhat elevated in setting of dietary indiscretion.  - Will again have take lasix 40 mg BID x 2 days. Keep chronic dose 40 mg daily and repeat as needed.  - Continue potassium 20 meq daily, extra 20 meq on extra lasix days.  - Can use 2.5 mg metolazone as needed, but encouraged to try extra lasix first.   - Reinforced fluid restriction to < 2 L daily, sodium restriction to less than 2000 mg daily, and the importance of daily weights.   2) Obesity - Reinforced need to watch portions and increase activity as able. Long talk about diet today.    3) Hyperlipidemia - Per PCP 4) Chronic Atrial Fibrillation.-- [place] Vasc Score 3.  - Rate controlled. Asymptomatic. - off amiodarone.  -  Continue bisoprolol 5 mg daily.  - Continue diltiazem 360 mg daily.  - Continue Xarelto 20 mg daily. 5) DM- Continue metoformin. Per PCP.   6) Hypothyroid- - Per PCP.  7) Hypoxia - Continue 02 at night.  8) Noncompliance - Reportedly taking meds as directed.   9) Basal cell carcinoma-  Biopsied 01/2014. Needs to have Mohs. She is deferring this due to cost.   Take lasix 40 mg BID x 2 days.  Labs today. May need to adjust potassium with extra lasix.  Due echo. Follow up 6 weeks MD side with Echo. Sooner with symptoms.   Shirley Friar, PA-C  3:49 PM

## 2016-03-04 ENCOUNTER — Telehealth (HOSPITAL_COMMUNITY): Payer: Self-pay | Admitting: *Deleted

## 2016-03-04 MED ORDER — POTASSIUM CHLORIDE CRYS ER 10 MEQ PO TBCR: 20.0000 meq | EXTENDED_RELEASE_TABLET | Freq: Two times a day (BID) | ORAL | 3 refills | Status: DC

## 2016-03-04 NOTE — Telephone Encounter (Signed)
Notes Recorded by Kennieth Rad, RN on 03/04/2016 at 10:53 AM EST Spoke with patient and she agrees to increase her K to 20 meq BID. She said she will have to call us back to schedule her lab appointment either today or tomorrow. I have updated her K in Epic. ------  Notes Recorded by Kerry Dory, CMA on 03/02/2016 at 4:12 PM EST Left message for patient to call back. [phone] [facility]  ------  Notes Recorded by Shirley Friar, PA-C on 03/02/2016 at 3:47 PM EST Increase K to 20 meq BID.  Recheck 7-10 days.

## 2016-03-30 ENCOUNTER — Other Ambulatory Visit (HOSPITAL_COMMUNITY): Payer: Self-pay | Admitting: Cardiology

## 2016-03-30 DIAGNOSIS — I5032 Chronic diastolic (congestive) heart failure: Secondary | ICD-10-CM

## 2016-03-30 MED ORDER — RIVAROXABAN 20 MG PO TABS: 20.0000 mg | ORAL_TABLET | Freq: Every day | ORAL | 3 refills | Status: DC

## 2016-03-30 MED ORDER — FUROSEMIDE 40 MG PO TABS: 40.0000 mg | ORAL_TABLET | Freq: Every day | ORAL | 3 refills | Status: DC

## 2016-03-30 MED ORDER — METOLAZONE 2.5 MG PO TABS: ORAL_TABLET | ORAL | 3 refills | Status: DC

## 2016-03-30 MED ORDER — POTASSIUM CHLORIDE CRYS ER 10 MEQ PO TBCR: 20.0000 meq | EXTENDED_RELEASE_TABLET | Freq: Two times a day (BID) | ORAL | 2 refills | Status: DC

## 2016-03-30 MED ORDER — DILTIAZEM HCL ER COATED BEADS 360 MG PO CP24: 360.0000 mg | ORAL_CAPSULE | Freq: Every day | ORAL | 3 refills | Status: DC

## 2016-03-30 MED ORDER — BISOPROLOL FUMARATE 5 MG PO TABS: 5.0000 mg | ORAL_TABLET | Freq: Every day | ORAL | 3 refills | Status: DC

## 2016-04-20 ENCOUNTER — Ambulatory Visit (HOSPITAL_COMMUNITY): Admission: RE | Admit: 2016-04-20 | Payer: BLUE CROSS/BLUE SHIELD | Source: Ambulatory Visit

## 2016-04-20 ENCOUNTER — Encounter (HOSPITAL_COMMUNITY): Payer: BLUE CROSS/BLUE SHIELD | Admitting: Internal Medicine

## 2016-05-31 ENCOUNTER — Other Ambulatory Visit: Payer: Self-pay

## 2016-05-31 ENCOUNTER — Encounter (HOSPITAL_COMMUNITY): Payer: Self-pay | Admitting: Internal Medicine

## 2016-05-31 ENCOUNTER — Ambulatory Visit (HOSPITAL_COMMUNITY)
Admission: RE | Admit: 2016-05-31 | Discharge: 2016-05-31 | Disposition: A | Discharge status: Home / Self Care | Payer: BLUE CROSS/BLUE SHIELD | Source: Ambulatory Visit | Attending: Student | Admitting: Student

## 2016-05-31 ENCOUNTER — Ambulatory Visit (HOSPITAL_BASED_OUTPATIENT_CLINIC_OR_DEPARTMENT_OTHER)
Admission: RE | Admit: 2016-05-31 | Discharge: 2016-05-31 | Disposition: A | Discharge status: Home / Self Care | Payer: BLUE CROSS/BLUE SHIELD | Source: Ambulatory Visit | Attending: Internal Medicine | Admitting: Internal Medicine

## 2016-05-31 VITALS — BP 102/86 | HR 87 | Wt 233.8 lb

## 2016-05-31 DIAGNOSIS — Z7984 Long term (current) use of oral hypoglycemic drugs: Secondary | ICD-10-CM | POA: Diagnosis not present

## 2016-05-31 DIAGNOSIS — Z85828 Personal history of other malignant neoplasm of skin: Secondary | ICD-10-CM | POA: Diagnosis not present

## 2016-05-31 DIAGNOSIS — J449 Chronic obstructive pulmonary disease, unspecified: Secondary | ICD-10-CM | POA: Insufficient documentation

## 2016-05-31 DIAGNOSIS — F329 Major depressive disorder, single episode, unspecified: Secondary | ICD-10-CM | POA: Insufficient documentation

## 2016-05-31 DIAGNOSIS — E669 Obesity, unspecified: Secondary | ICD-10-CM | POA: Diagnosis not present

## 2016-05-31 DIAGNOSIS — Z79899 Other long term (current) drug therapy: Secondary | ICD-10-CM | POA: Insufficient documentation

## 2016-05-31 DIAGNOSIS — G4733 Obstructive sleep apnea (adult) (pediatric): Secondary | ICD-10-CM | POA: Insufficient documentation

## 2016-05-31 DIAGNOSIS — E119 Type 2 diabetes mellitus without complications: Secondary | ICD-10-CM | POA: Diagnosis not present

## 2016-05-31 DIAGNOSIS — Z9981 Dependence on supplemental oxygen: Secondary | ICD-10-CM | POA: Diagnosis not present

## 2016-05-31 DIAGNOSIS — I110 Hypertensive heart disease with heart failure: Secondary | ICD-10-CM | POA: Insufficient documentation

## 2016-05-31 DIAGNOSIS — I482 Chronic atrial fibrillation, unspecified: Secondary | ICD-10-CM

## 2016-05-31 DIAGNOSIS — K219 Gastro-esophageal reflux disease without esophagitis: Secondary | ICD-10-CM | POA: Diagnosis not present

## 2016-05-31 DIAGNOSIS — R0902 Hypoxemia: Secondary | ICD-10-CM | POA: Insufficient documentation

## 2016-05-31 DIAGNOSIS — E039 Hypothyroidism, unspecified: Secondary | ICD-10-CM | POA: Insufficient documentation

## 2016-05-31 DIAGNOSIS — I5032 Chronic diastolic (congestive) heart failure: Secondary | ICD-10-CM

## 2016-05-31 DIAGNOSIS — Z9114 Patient's other noncompliance with medication regimen: Secondary | ICD-10-CM | POA: Insufficient documentation

## 2016-05-31 DIAGNOSIS — E785 Hyperlipidemia, unspecified: Secondary | ICD-10-CM | POA: Insufficient documentation

## 2016-05-31 DIAGNOSIS — Z7901 Long term (current) use of anticoagulants: Secondary | ICD-10-CM | POA: Diagnosis not present

## 2016-05-31 NOTE — Progress Notes (Signed)
Patient ID: [name], female   DOB: 1960/05/12, 56 y.o.   MRN: [id number]   PCP:  Dr. Idell Pickles  Primary Cardiologist:  Dr. [name] Pulmonologist: Dr Gwenette Greet EP: Dr Rayann Heman  History of Present Illness: [name] is a 56 y.o. female with a history of chronic diastolic heart failure, chronic AF, HTN, hyperlipidemia, sleep apnea, glucose intolerance, COPD and GERD.  She was admitted 6/2-6/11/2010.  Prior to admission, she had stopped all of her medications due to difficulties with finances.  She had been developing increasing dyspnea requiring several rounds of antibiotics and prednisone taper.  She presented to the hospital with narrow complex tachycardia treated with adenosine x2.  She developed normal sinus rhythm briefly and then converted to atrial flutter with rapid ventricular rate.  Myocardial infarction was ruled out.  She continued to have intermittent episodes of atrial flutter.  She was placed on flecainide.   She was seen by EP.  Given her multiple atrial arrhythmias, ablation therapy was not felt to be helpful and she was continued on flecainide.  She had continued dyspnea and CT scan demonstrated a right lower lobe nonocclusive pulmonary embolism.  She was placed on heparin and Coumadin.    Admitted 01/14/12 with afib with RVR (NSR 09/23/10) and volume overload.  During hospitalization Underwent TEE/DCCV and DCCV  which showed no thrombus.  She failed 2 cardioversions therefore rate control was pursued. Flecainide was stopped and transitioned to cardizem and amiodarone. She was also placed on Xarelto. Discharge weight 215 pounds.  She presents today for HF follow up. Overall doing ok. Says breathing " sucks" but attributes to her lungs. Says swelling is under control. Takes lasix 2x/day. No edema. No PND. Now with chronic AF. Rate controlled. Takes xarelto. No bleeding   06/17/2014 ECHO EF 55-60% perhaps mild RV HK  05/31/16 EF EF 60-65% RV normal.   Labs  07/31/12 TSH  6.06 T4 1.14  ALT 40 11/22/12 K 3.5 Creatinine 1.15 09/06/13 K 3.9 12/18/13 K 3.8 Creatinine 1.3 Pro BNP 1242  01/01/14: K 4.5, creatinine 1.29 02/05/14: K 4.1 Creatinine 1.15 07/17/2014: K 4.1 Creatinine 1.53  02/2016 K 3.4, creatinine 1.5  SH; Works full time at [employer]. Lives with her 2 sons. Separated from her husband  ROS: All pertinent positives and negatives as in HPI, otherwise negative.     Past Medical History:  Diagnosis Date  . Anemia   . Anxiety   . Atrial flutter ([place])   . Borderline diabetes   . Chronic diastolic heart failure ([place])   . COPD (chronic obstructive pulmonary disease) ([place])   . Depression   . GERD (gastroesophageal reflux disease)   . HTN (hypertension)   . Hyperlipidemia   . Obstructive sleep apnea   . Persistent atrial fibrillation ([place])    Myoview 4/09: No ischemia;  echo 4/09: EF 60-65%;   Flecainide, beta blocker, Coumadin;    Unable to take Tikosyn 2/2 prolonged QT  . Pulmonary embolism (HCC)    Right lower lobe diagnosed by CT 6/12  . Type II or unspecified type diabetes mellitus without mention of complication, not stated as uncontrolled     Current Outpatient Prescriptions  Medication Sig Dispense Refill  . ALPRAZolam (XANAX) 0.5 MG tablet 1/2-1 pill PRN before procedure, if need to can take 1 at one time. Do not drive if taking. 30 tablet 0  . aspirin-acetaminophen-caffeine (EXCEDRIN MIGRAINE) 459-977-41 MG per tablet Take 1 tablet by mouth every 6 (six) hours as needed  for headache.    . bisoprolol (ZEBETA) 5 MG tablet Take 1 tablet (5 mg total) by mouth daily. 90 tablet 3  . diltiazem (CARDIZEM CD) 360 MG 24 hr capsule Take 1 capsule (360 mg total) by mouth daily. 90 capsule 3  . furosemide (LASIX) 40 MG tablet Take 1 tablet (40 mg total) by mouth daily. Take an additional 40 mg tablet for weight gain/leg swelling 120 tablet 3  . metolazone (ZAROXOLYN) 2.5 MG tablet As needed for increase in swelling 90 tablet 3  . naproxen  sodium (ANAPROX) 220 MG tablet Take 220 mg by mouth 2 (two) times daily as needed (back pain).    Marland Kitchen OVER THE COUNTER MEDICATION as needed. Equate Night time sleep aid    . OVER THE COUNTER MEDICATION Take 100 mg by mouth at bedtime. Equate sleep medicine    . potassium chloride (K-DUR,KLOR-CON) 10 MEQ tablet Take 2 tablets (20 mEq total) by mouth 2 (two) times daily. Take additional 20 meq (2 tablets) if take additional furosemide 40 mg tablet 400 tablet 2  . rivaroxaban (XARELTO) 20 MG TABS tablet Take 1 tablet (20 mg total) by mouth daily with supper. 90 tablet 3   No current facility-administered medications for this encounter.     Allergies: Allergies  Allergen Reactions  . Sulfonamide Derivatives Hives and Itching  . Tikosyn [Dofetilide]     Prolonged qt   Vitals:   05/31/16 1437  BP: 102/86  Pulse: 87  SpO2: (!) 89%  Weight: 233 lb 12.8 oz (106.1 kg)   Wt Readings from Last 3 Encounters:  05/31/16 233 lb 12.8 oz (106.1 kg)  03/02/16 241 lb 8 oz (109.5 kg)  01/20/16 239 lb 3.2 oz (108.5 kg)    PHYSICAL EXAM: General: Obese female, NAD   Psych: Normal affect. HEENT: Normal, atraumatic anicteric large basal cell on left side of nose Neck: Supple, no bruits. JVP hard to see looks 6-7   Carotids 2+. No lymphadenopathy/thyromegaly appreciated. Heart: PMI nonpalpable , irregularly irregular rate and rhythm. No murmurs.  Lungs: CTAB, decreased breath sounds throughout no wheezing.  Abdomen: Markedly obese. soft, non-tender, non-distended,  + Bowel sounds in all 4 quadrants.  Extremities: No clubbing or cyanosis. DP/PT/Radials 2+ and equal bilaterally. No edema Neuro: Alert and oriented X 3. Moves all extremities spontaneously.   ASSESSMENT AND PLAN:  1) Chronic diastolic HF:  EF 83%, mod MR (01/2012) - Echo reviewed personally today EF 60-65%. RV appears normal - NYHA III - Volume status looks ok.  - Continue current regimen. 2) Obesity - Reinforced need  For weight  loss and more activity. This is a major component of her functional limitation 3) Hyperlipidemia - Per PCP 4) Chronic Atrial Fibrillation.-- [place] Vasc Score 3.  - Rate controlled. Asymptomatic. - off amiodarone.  - Continue bisoprolol 5 mg daily.  - Continue diltiazem 360 mg daily.  - Continue Xarelto 20 mg daily. 5) DM- Continue metoformin. Per PCP.   6) Hypothyroid- - Per PCP.  7) Hypoxia - Continue O2 at night.  8) Noncompliance - Reportedly taking meds as directed.   9) Basal cell carcinoma-  Biopsied 01/2014. Needs to have Mohs. She is deferring this due to cost.   Arbutus Leas, NP  2:40 PM   Patient seen and examined with Jettie Booze, NP. We discussed all aspects of the encounter. I agree with the assessment and plan as stated above.   Echo reviewed personally. EF is normal. She persists with chronic AF  but rate controlled and well tolerated. Continue Xarelto. Remains NYHA III but suspect this is mainly due to her weight reinforced need for weight loss. She has large basal cell CA on her nose that was biopsied almost 3 years ago. I emphasized need to get this resected to avoid local invasion.   [name], MD  10:28 PM

## 2016-05-31 NOTE — Patient Instructions (Signed)
Follow up in 6 Months, we will call you to schedule appointment.

## 2016-06-07 NOTE — Progress Notes (Signed)
Assessment and Plan:   Chronic diastolic heart failure ([place]) Continue follow up cardio, weight stable, continue weight loss  Chronic atrial fibrillation ([place]) Rate controlled, continue medications, xarelto  Secondary hypertension - continue medications, DASH diet, exercise and monitor at home. Call if greater than 130/80.  -     CBC with Differential/Platelet -     BASIC METABOLIC PANEL WITH GFR -     Hepatic function panel -     TSH  Chronic obstructive pulmonary disease, unspecified COPD type ([place]) Continue weight loss, will try to get on CPAP Will discuss inhalers next OV and CXR  Hyperlipidemia, unspecified hyperlipidemia type -check lipids, decrease fatty foods, increase activity.  -     Lipid panel  Morbid obesity (BMI 49.6) - long discussion about weight loss, diet, and exercise  OSA (obstructive sleep apnea) Not on CPAP Will try to get on CPAP/autotitrate Would help with breathing, CHF, afib  Depression, unspecified depression type -     ALPRAZolam (XANAX) 0.5 MG tablet; 1/2-1 pill PRN before procedure, if need to can take 1 at one time. Do not drive if taking. -     traZODone (DESYREL) 100 MG tablet; 1/2-1 tablet for sleep -     sertraline (ZOLOFT) 50 MG tablet; Take 1 tablet (50 mg total) by mouth daily.  Type 2 diabetes mellitus without complication, without long-term current use of insulin (HCC) -     Hemoglobin A1c Discussed general issues about diabetes pathophysiology and management., Educational material distributed., Suggested low cholesterol diet., Encouraged aerobic exercise., Discussed foot care., Reminded to get yearly retinal exam.  Anemia, unspecified type -     Iron and TIBC -     Vitamin B12  Medication management -     Magnesium  Vitamin D deficiency -     VITAMIN D 25 Hydroxy (Vit-D Deficiency, Fractures)  Patient walked out without getting labs, will contact and bring back for lab only.  Continue diet and meds as discussed. Discussed  med's effects and SE's.   HPI 56 y.o. female  presents for 3 month follow up with hypertension, hyperlipidemia, diabetes and vitamin D. Her blood pressure has been controlled at home, today their BP is BP: 126/80 She does not workout. She denies chest pain, shortness of breath, dizziness.  She has a history of afib and CHF, she is on amiodarone, cardizem, she is on xarleto, following with Dr. Haroldine Laws. She has had low potassium and was suppose to double up but has had nausea and not taking lasix or potassium last few days.  She is not on cholesterol medication and denies myalgias. Her cholesterol is not at goal. The cholesterol was: Lab Results  Component Value Date   CHOL 193 09/06/2013   HDL 60 09/06/2013   LDLCALC 102 (H) 09/06/2013   TRIG 153 (H) 09/06/2013   CHOLHDL 3.2 09/06/2013     She has been working on diet and exercise for Diabetes, and denies polydipsia, polyuria and visual disturbances. Last A1C  was:  Lab Results  Component Value Date   HGBA1C 6.3 (H) 09/06/2013   Patient is on Vitamin D supplement. She states her dad died a year ago, living in his house, trying to get his affairs in order, grandson passed, crying a lot.  She has constipation, decreased appetite, needs colonoscopy overdue, needs MGM.  She is on thyroid medication. Her medication was not changed last visit. Patient has been having weight gain and constipation.  Lab Results  Component Value Date  TSH 3.798 09/06/2013  .  BMI is Body mass index is 45.64 kg/m., she is has OSA.  Wt Readings from Last 3 Encounters:  06/09/16 229 lb 12.8 oz (104.2 kg)  05/31/16 233 lb 12.8 oz (106.1 kg)  03/02/16 241 lb 8 oz (109.5 kg)     Current Medications:  Current Outpatient Prescriptions on File Prior to Visit  Medication Sig Dispense Refill  . ALPRAZolam (XANAX) 0.5 MG tablet 1/2-1 pill PRN before procedure, if need to can take 1 at one time. Do not drive if taking. 30 tablet 0  .  aspirin-acetaminophen-caffeine (EXCEDRIN MIGRAINE) 001-749-44 MG per tablet Take 1 tablet by mouth every 6 (six) hours as needed for headache.    . bisoprolol (ZEBETA) 5 MG tablet Take 1 tablet (5 mg total) by mouth daily. 90 tablet 3  . diltiazem (CARDIZEM CD) 360 MG 24 hr capsule Take 1 capsule (360 mg total) by mouth daily. 90 capsule 3  . furosemide (LASIX) 40 MG tablet Take 1 tablet (40 mg total) by mouth daily. Take an additional 40 mg tablet for weight gain/leg swelling 120 tablet 3  . metolazone (ZAROXOLYN) 2.5 MG tablet As needed for increase in swelling 90 tablet 3  . naproxen sodium (ANAPROX) 220 MG tablet Take 220 mg by mouth 2 (two) times daily as needed (back pain).    Marland Kitchen OVER THE COUNTER MEDICATION as needed. Equate Night time sleep aid    . OVER THE COUNTER MEDICATION Take 100 mg by mouth at bedtime. Equate sleep medicine    . potassium chloride (K-DUR,KLOR-CON) 10 MEQ tablet Take 2 tablets (20 mEq total) by mouth 2 (two) times daily. Take additional 20 meq (2 tablets) if take additional furosemide 40 mg tablet 400 tablet 2  . rivaroxaban (XARELTO) 20 MG TABS tablet Take 1 tablet (20 mg total) by mouth daily with supper. 90 tablet 3   No current facility-administered medications on file prior to visit.    Medical History:  Past Medical History:  Diagnosis Date  . Anemia   . Anxiety   . Atrial flutter ([place])   . Borderline diabetes   . Chronic diastolic heart failure ([place])   . COPD (chronic obstructive pulmonary disease) ([place])   . Depression   . GERD (gastroesophageal reflux disease)   . HTN (hypertension)   . Hyperlipidemia   . Obstructive sleep apnea   . Persistent atrial fibrillation ([place])    Myoview 4/09: No ischemia;  echo 4/09: EF 60-65%;   Flecainide, beta blocker, Coumadin;    Unable to take Tikosyn 2/2 prolonged QT  . Pulmonary embolism (HCC)    Right lower lobe diagnosed by CT 6/12  . Type II or unspecified type diabetes mellitus without mention of complication,  not stated as uncontrolled    Allergies:  Allergies  Allergen Reactions  . Sulfonamide Derivatives Hives and Itching  . Tikosyn [Dofetilide]     Prolonged qt    Review of Systems:  See HPI  Family history- Review and unchanged Social history- Review and unchanged Physical Exam: BP 126/80   Pulse (!) 104   Temp 97.4 F (36.3 C)   Resp 16   Ht 4' 11.5" (1.511 m)   Wt 229 lb 12.8 oz (104.2 kg)   SpO2 95%   BMI 45.64 kg/m  Wt Readings from Last 3 Encounters:  06/09/16 229 lb 12.8 oz (104.2 kg)  05/31/16 233 lb 12.8 oz (106.1 kg)  03/02/16 241 lb 8 oz (109.5 kg)   General  Appearance: Well nourished, obese, in no apparent distress. Eyes: PERRLA, EOMs, conjunctiva no swelling or erythema Sinuses: No Frontal/maxillary tenderness ENT/Mouth: Ext aud canals clear, TMs without erythema, bulging. No erythema, swelling, or exudate on post pharynx.  Tonsils not swollen or erythematous. Hearing normal. Crowded mouth Neck: Supple, thyroid normal.  Respiratory: Decreased breath sounds bilateral, with wheezing and decreased breath sounds bilateral lower lobes Cardio: Irreg, irreg,  2-3 + edema Abdomen: Soft, + BS, obese,  Non tender, no guarding, rebound, hernias, masses. Lymphatics: Non tender without lymphadenopathy.  Musculoskeletal: Full ROM, 5/5 strength, antalgic gait Skin: Warm, dry without rashes, lesions, ecchymosis.  Neuro: Cranial nerves intact. No cerebellar symptoms.  Psych: Awake and oriented X 3, tearful, Insight and Judgment appropriate.    Vicie Mutters, PA-C 12:02 PM [facility]

## 2016-06-09 ENCOUNTER — Ambulatory Visit (INDEPENDENT_AMBULATORY_CARE_PROVIDER_SITE_OTHER): Payer: BLUE CROSS/BLUE SHIELD | Admitting: Physician Assistant

## 2016-06-09 ENCOUNTER — Encounter: Payer: Self-pay | Admitting: Physician Assistant

## 2016-06-09 VITALS — BP 126/80 | HR 104 | Temp 97.4°F | Resp 16 | Ht 59.5 in | Wt 229.8 lb

## 2016-06-09 DIAGNOSIS — I482 Chronic atrial fibrillation, unspecified: Secondary | ICD-10-CM

## 2016-06-09 DIAGNOSIS — E119 Type 2 diabetes mellitus without complications: Secondary | ICD-10-CM | POA: Diagnosis not present

## 2016-06-09 DIAGNOSIS — I5032 Chronic diastolic (congestive) heart failure: Secondary | ICD-10-CM

## 2016-06-09 DIAGNOSIS — G4733 Obstructive sleep apnea (adult) (pediatric): Secondary | ICD-10-CM

## 2016-06-09 DIAGNOSIS — E559 Vitamin D deficiency, unspecified: Secondary | ICD-10-CM | POA: Diagnosis not present

## 2016-06-09 DIAGNOSIS — D649 Anemia, unspecified: Secondary | ICD-10-CM

## 2016-06-09 DIAGNOSIS — I159 Secondary hypertension, unspecified: Secondary | ICD-10-CM

## 2016-06-09 DIAGNOSIS — F329 Major depressive disorder, single episode, unspecified: Secondary | ICD-10-CM

## 2016-06-09 DIAGNOSIS — F32A Depression, unspecified: Secondary | ICD-10-CM

## 2016-06-09 DIAGNOSIS — Z79899 Other long term (current) drug therapy: Secondary | ICD-10-CM | POA: Diagnosis not present

## 2016-06-09 DIAGNOSIS — J449 Chronic obstructive pulmonary disease, unspecified: Secondary | ICD-10-CM | POA: Diagnosis not present

## 2016-06-09 DIAGNOSIS — E785 Hyperlipidemia, unspecified: Secondary | ICD-10-CM

## 2016-06-09 LAB — CBC WITH DIFFERENTIAL/PLATELET
Basophils Absolute: 0 cells/uL (ref 0–200)
Basophils Relative: 0 %
Eosinophils Absolute: 89 cells/uL (ref 15–500)
Eosinophils Relative: 1 %
HCT: 44.7 % (ref 35.0–45.0)
Hemoglobin: 14.8 g/dL (ref 11.7–15.5)
Lymphocytes Relative: 27 %
Lymphs Abs: 2403 cells/uL (ref 850–3900)
MCH: 29.7 pg (ref 27.0–33.0)
MCHC: 33.1 g/dL (ref 32.0–36.0)
MCV: 89.6 fL (ref 80.0–100.0)
MPV: 11.9 fL (ref 7.5–12.5)
Monocytes Absolute: 712 cells/uL (ref 200–950)
Monocytes Relative: 8 %
Neutro Abs: 5696 cells/uL (ref 1500–7800)
Neutrophils Relative %: 64 %
Platelets: 249 10*3/uL (ref 140–400)
RBC: 4.99 MIL/uL (ref 3.80–5.10)
RDW: 15.5 % — ABNORMAL HIGH (ref 11.0–15.0)
WBC: 8.9 10*3/uL (ref 3.8–10.8)

## 2016-06-09 LAB — TSH: TSH: 4.08 mIU/L

## 2016-06-09 MED ORDER — SERTRALINE HCL 50 MG PO TABS: 50.0000 mg | ORAL_TABLET | Freq: Every day | ORAL | 2 refills | Status: DC

## 2016-06-09 MED ORDER — ALPRAZOLAM 0.5 MG PO TABS: ORAL_TABLET | ORAL | 0 refills | Status: DC

## 2016-06-09 MED ORDER — TRAZODONE HCL 100 MG PO TABS: ORAL_TABLET | ORAL | 0 refills | Status: DC

## 2016-06-09 NOTE — Patient Instructions (Addendum)
[facility]  7 a.m.-6:30 p.m., Monday 7 a.m.-5 p.m., Tuesday-Friday Schedule an appointment by calling [phone].  [facility] Schedule an appointment by calling [phone].  Encourage you to get the 3D Mammogram  The 3D Mammogram is much more specific and sensitive to pick up breast cancer. For women with fibrocystic breast or lumpy breast it can be hard to determine if it is cancer or not but the 3D mammogram is able to tell this difference which cuts back on unneeded additional tests or scary call backs.   - over 40% increase in detection of breast cancer - over 40% reduction in false positives.  - fewer call backs - reduced anxiety - improved outcomes - [place] [facility]   I think it is possible that you have sleep apnea. It can cause interrupted sleep, headaches, frequent awakenings, fatigue, dry mouth, fast/slow heart beats, memory issues, anxiety/depression, swelling, numbness tingling hands/feet, weight gain, shortness of breath, and the list goes on. Sleep apnea needs to be ruled out because if it is left untreated it does eventually lead to abnormal heart beats, lung failure or heart failure as well as increasing the risk of heart attack and stroke. There are masks you can wear OR a mouth piece that I can give you information about. Often times though people feel MUCH better after getting treatment.   Sleep Apnea  Sleep apnea is a sleep disorder characterized by abnormal pauses in breathing while you sleep. When your breathing pauses, the level of oxygen in your blood decreases. This causes you to move out of deep sleep and into light sleep. As a result, your quality of sleep is poor, and the system that carries your blood throughout your body (cardiovascular system) experiences stress. If sleep apnea remains untreated, the following conditions can develop:  High blood pressure (hypertension).  Coronary artery disease.  Inability to achieve  or maintain an erection (impotence).  Impairment of your thought process (cognitive dysfunction). There are three types of sleep apnea: 1. Obstructive sleep apnea--Pauses in breathing during sleep because of a blocked airway. 2. Central sleep apnea--Pauses in breathing during sleep because the area of the brain that controls your breathing does not send the correct signals to the muscles that control breathing. 3. Mixed sleep apnea--A combination of both obstructive and central sleep apnea.  RISK FACTORS The following risk factors can increase your risk of developing sleep apnea:  Being overweight.  Smoking.  Having narrow passages in your nose and throat.  Being of older age.  Being female.  Alcohol use.  Sedative and tranquilizer use.  Ethnicity. Among individuals younger than 35 years, African Americans are at increased risk of sleep apnea. SYMPTOMS   Difficulty staying asleep.  Daytime sleepiness and fatigue.  Loss of energy.  Irritability.  Loud, heavy snoring.  Morning headaches.  Trouble concentrating.  Forgetfulness.  Decreased interest in sex. DIAGNOSIS  In order to diagnose sleep apnea, your caregiver will perform a physical examination. Your caregiver may suggest that you take a home sleep test. Your caregiver may also recommend that you spend the night in a sleep lab. In the sleep lab, several monitors record information about your heart, lungs, and brain while you sleep. Your leg and arm movements and blood oxygen level are also recorded. TREATMENT The following actions may help to resolve mild sleep apnea:  Sleeping on your side.   Using a decongestant if you have nasal congestion.   Avoiding the use of depressants,  including alcohol, sedatives, and narcotics.   Losing weight and modifying your diet if you are overweight. There also are devices and treatments to help open your airway:  Oral appliances. These are custom-made mouthpieces that  shift your lower jaw forward and slightly open your bite. This opens your airway.  Devices that create positive airway pressure. This positive pressure "splints" your airway open to help you breathe better during sleep. The following devices create positive airway pressure:  Continuous positive airway pressure (CPAP) device. The CPAP device creates a continuous level of air pressure with an air pump. The air is delivered to your airway through a mask while you sleep. This continuous pressure keeps your airway open.  Nasal expiratory positive airway pressure (EPAP) device. The EPAP device creates positive air pressure as you exhale. The device consists of single-use valves, which are inserted into each nostril and held in place by adhesive. The valves create very little resistance when you inhale but create much more resistance when you exhale. That increased resistance creates the positive airway pressure. This positive pressure while you exhale keeps your airway open, making it easier to breath when you inhale again.  Bilevel positive airway pressure (BPAP) device. The BPAP device is used mainly in patients with central sleep apnea. This device is similar to the CPAP device because it also uses an air pump to deliver continuous air pressure through a mask. However, with the BPAP machine, the pressure is set at two different levels. The pressure when you exhale is lower than the pressure when you inhale.  Surgery. Typically, surgery is only done if you cannot comply with less invasive treatments or if the less invasive treatments do not improve your condition. Surgery involves removing excess tissue in your airway to create a wider passage way. Document Released: 01/15/2002 Document Revised: 05/22/2012 Document Reviewed: 06/03/2011 [facility] Patient Information 2015 [place], [place]. This information is not intended to replace advice given to you by your health care provider. Make sure you discuss any  questions you have with your health care provider.        Bad carbs also include fruit juice, alcohol, and sweet tea. These are empty calories that do not signal to your brain that you are full.   Please remember the good carbs are still carbs which convert into sugar. So please measure them out no more than 1/2-1 cup of rice, oatmeal, pasta, and beans  Veggies are however free foods! Pile them on.   Not all fruit is created equal. Please see the list below, the fruit at the bottom is higher in sugars than the fruit at the top. Please avoid all dried fruits.

## 2016-06-10 ENCOUNTER — Other Ambulatory Visit: Payer: Self-pay | Admitting: Physician Assistant

## 2016-06-10 LAB — BASIC METABOLIC PANEL WITH GFR
BUN: 37 mg/dL — ABNORMAL HIGH (ref 7–25)
CO2: 35 mmol/L — ABNORMAL HIGH (ref 20–31)
Calcium: 10.3 mg/dL (ref 8.6–10.4)
Chloride: 89 mmol/L — ABNORMAL LOW (ref 98–110)
Creat: 2.12 mg/dL — ABNORMAL HIGH (ref 0.50–1.05)
GFR, Est African American: 29 mL/min — ABNORMAL LOW (ref >=60)
GFR, Est Non African American: 25 mL/min — ABNORMAL LOW (ref >=60)
Glucose, Bld: 88 mg/dL (ref 65–99)
Potassium: 3 mmol/L — ABNORMAL LOW (ref 3.5–5.3)
Sodium: 143 mmol/L (ref 135–146)

## 2016-06-10 LAB — HEPATIC FUNCTION PANEL
ALT: 33 U/L — ABNORMAL HIGH (ref 6–29)
AST: 22 U/L (ref 10–35)
Albumin: 4.2 g/dL (ref 3.6–5.1)
Alkaline Phosphatase: 93 U/L (ref 33–130)
Bilirubin, Direct: 0.1 mg/dL (ref <=0.2)
Indirect Bilirubin: 0.5 mg/dL (ref 0.2–1.2)
Total Bilirubin: 0.6 mg/dL (ref 0.2–1.2)
Total Protein: 7.2 g/dL (ref 6.1–8.1)

## 2016-06-10 LAB — LIPID PANEL
Cholesterol: 262 mg/dL — ABNORMAL HIGH (ref <200)
HDL: 45 mg/dL — ABNORMAL LOW (ref >50)
LDL Cholesterol: 176 mg/dL — ABNORMAL HIGH (ref <100)
Total CHOL/HDL Ratio: 5.8 Ratio — ABNORMAL HIGH (ref <5.0)
Triglycerides: 205 mg/dL — ABNORMAL HIGH (ref <150)
VLDL: 41 mg/dL — ABNORMAL HIGH (ref <30)

## 2016-06-10 LAB — VITAMIN D 25 HYDROXY (VIT D DEFICIENCY, FRACTURES): Vit D, 25-Hydroxy: 10 ng/mL — ABNORMAL LOW (ref 30–100)

## 2016-06-10 LAB — IRON AND TIBC
%SAT: 29 % (ref 11–50)
Iron: 110 ug/dL (ref 45–160)
TIBC: 374 ug/dL (ref 250–450)
UIBC: 264 ug/dL (ref 125–400)

## 2016-06-10 LAB — MAGNESIUM: Magnesium: 2.2 mg/dL (ref 1.5–2.5)

## 2016-06-10 LAB — HEMOGLOBIN A1C
Hgb A1c MFr Bld: 5.8 % — ABNORMAL HIGH (ref <5.7)
Mean Plasma Glucose: 120 mg/dL

## 2016-06-10 LAB — VITAMIN B12: Vitamin B-12: 501 pg/mL (ref 200–1100)

## 2016-06-10 NOTE — Progress Notes (Signed)
Pt aware of lab results & voiced understanding of those results.

## 2016-06-10 NOTE — Progress Notes (Signed)
LVM for pt to return office call for LAB results.

## 2016-06-14 ENCOUNTER — Other Ambulatory Visit (HOSPITAL_COMMUNITY): Payer: Self-pay | Admitting: Cardiology

## 2016-06-14 DIAGNOSIS — I5032 Chronic diastolic (congestive) heart failure: Secondary | ICD-10-CM

## 2016-06-14 MED ORDER — METOLAZONE 2.5 MG PO TABS: ORAL_TABLET | ORAL | 3 refills | Status: DC

## 2016-06-15 ENCOUNTER — Other Ambulatory Visit: Payer: Self-pay | Admitting: Physician Assistant

## 2016-06-15 MED ORDER — POTASSIUM CHLORIDE CRYS ER 10 MEQ PO TBCR: 20.0000 meq | EXTENDED_RELEASE_TABLET | Freq: Two times a day (BID) | ORAL | 2 refills | Status: DC

## 2016-06-15 MED ORDER — ATORVASTATIN CALCIUM 20 MG PO TABS: 20.0000 mg | ORAL_TABLET | Freq: Every day | ORAL | 11 refills | Status: DC

## 2016-07-28 NOTE — Progress Notes (Signed)
Assessment & Plan:   Chronic diastolic heart failure (HCC) Weight is up, + SOB, + PND, + orthopnea Check labs, add metalazone once, continue potassium Go to the er if any worsening SOB or any CP Instructed son Chance and patient -     Brain natriuretic peptide  Chronic atrial fibrillation (HCC) Rate controlled, continue meds  Secondary hypertension - continue medications, DASH diet, exercise and monitor at home. Call if greater than 130/80.  -     CBC with Differential/Platelet -     BASIC METABOLIC PANEL WITH GFR -     Hepatic function panel  Chronic obstructive pulmonary disease, unspecified COPD type ([place]) Given breathing treatment in the office Patient would benefit from nebulizer at home, will send referral to advanced home care Given trelegy samples x 2 months  Morbid obesity (BMI 49.6) LONG discussion that weight loss would help DCHF, COPD, and OSA  OSA (obstructive sleep apnea) Feel that a CPAP ASAP would help get/keep patient euvolemic and help COPD Has OV with Dr. Brett Fairy.   Depression, unspecified depression type Will switch zoloft to celexa 37m  CKD (chronic kidney disease) stage 4, GFR 15-29 ml/min (HCC) Will recheck, referral to nephrology is in place -     BASIC METABOLIC PANEL WITH GFR  Hyperlipidemia, unspecified hyperlipidemia type -     Lipid panel  Dyspnea, unspecified type -     Brain natriuretic peptide -     ipratropium-albuterol (DUONEB) 0.5-2.5 (3) MG/3ML nebulizer solution 3 mL; Take 3 mLs by nebulization once. - take 1/2 metalzone -Monitor weight - go to ER if worsening SOB or CP - Patient has symptoms of dypsnea, wheezing due to COPD, has been on albuterol in the past, ordering nebulizer to maximize therapy for patient's benefit.   Future Appointments Date Time Provider [place] 08/04/2016 8:00 AM Dohmeier, CAsencion Partridge MD [facility] None    Subjective:    Patient ID: [name] female    DOB: 58-06-1960 56y.o.   MRN:  [id number] HPI 56y.o. obese WF with history of DHF, afib, COPD, OSA, DM2 presents with fatigue and weight gain.  Will try to get on CPAP, has OV with Dr. DBrett Fairy has COPD, has no inhalers, feels lbreathing has been worse last week or two, weight is up + PND/orthpnea, she follows with Dr. BHarvie Bridgefor CHF, she is on lasix 461mBID and 6 potassium.  Lab Results  Component Value Date   CREATININE 2.12 (H) 06/09/2016   BUN 37 (H) 06/09/2016   NA 143 06/09/2016   K 3.0 (L) 06/09/2016   CL 89 (L) 06/09/2016   CO2 35 (H) 06/09/2016   Lab Results  Component Value Date   GFRNONAA 25 (L) 06/09/2016   Her blood pressure has been controlled at home, today their BP is BP: 128/76  She feels the zolft is not helping depression.   She is on cholesterol medication and denies myalgias. Her cholesterol is not at goal. The cholesterol last visit was:   Lab Results  Component Value Date   CHOL 262 (H) 06/09/2016   HDL 45 (L) 06/09/2016   LDLCALC 176 (H) 06/09/2016   TRIG 205 (H) 06/09/2016   CHOLHDL 5.8 (H) 06/09/2016   Patient is on Vitamin D supplement.   Lab Results  Component Value Date   VD25OH 10 (L) 06/09/2016     She is on trazodone 10057mnd benadryl that helps with sleep.  BMI is Body mass index is 46.27 kg/m., she  is working on diet and exercise. Wt Readings from Last 3 Encounters:  07/29/16 233 lb (105.7 kg)  06/09/16 229 lb 12.8 oz (104.2 kg)  05/31/16 233 lb 12.8 oz (106.1 kg)    Blood pressure 128/76, pulse 88, temperature 98.1 F (36.7 C), resp. rate 18, height 4' 11.5" (1.511 m), weight 233 lb (105.7 kg), SpO2 95 %.   Medications Current Outpatient Prescriptions on File Prior to Visit  Medication Sig  . ALPRAZolam (XANAX) 0.5 MG tablet 1/2-1 pill PRN before procedure, if need to can take 1 at one time. Do not drive if taking.  Marland Kitchen aspirin-acetaminophen-caffeine (EXCEDRIN MIGRAINE) 250-250-65 MG per tablet Take 1 tablet by mouth every 6 (six) hours as needed for  headache.  Marland Kitchen atorvastatin (LIPITOR) 20 MG tablet Take 1 tablet (20 mg total) by mouth daily.  . bisoprolol (ZEBETA) 5 MG tablet Take 1 tablet (5 mg total) by mouth daily.  Marland Kitchen diltiazem (CARDIZEM CD) 360 MG 24 hr capsule Take 1 capsule (360 mg total) by mouth daily.  . furosemide (LASIX) 40 MG tablet Take 1 tablet (40 mg total) by mouth daily. Take an additional 40 mg tablet for weight gain/leg swelling  . metolazone (ZAROXOLYN) 2.5 MG tablet As needed for increase in swelling  . naproxen sodium (ANAPROX) 220 MG tablet Take 220 mg by mouth 2 (two) times daily as needed (back pain).  Marland Kitchen OVER THE COUNTER MEDICATION as needed. Equate Night time sleep aid  . OVER THE COUNTER MEDICATION Take 100 mg by mouth at bedtime. Equate sleep medicine  . potassium chloride (K-DUR,KLOR-CON) 10 MEQ tablet Take 2 tablets (20 mEq total) by mouth 2 (two) times daily. Take additional 20 meq (2 tablets) if take additional furosemide 40 mg tablet  . rivaroxaban (XARELTO) 20 MG TABS tablet Take 1 tablet (20 mg total) by mouth daily with supper.  . sertraline (ZOLOFT) 50 MG tablet Take 1 tablet (50 mg total) by mouth daily.  . traZODone (DESYREL) 100 MG tablet 1/2-1 tablet for sleep   No current facility-administered medications on file prior to visit.     Problem list She has DEPRESSION; Obstructive sleep apnea; G E R D; Chronic diastolic heart failure ([place]); Pulmonary embolism ([place]); Atrial flutter ([place]); COPD (chronic obstructive pulmonary disease) ([place]); Local skin infection; Atrial fibrillation ([place]); Hypothyroid; Hyperlipidemia; HTN (hypertension); Diabetes type 2, uncontrolled ([place]); Anxiety; Anemia; Vitamin D deficiency; Morbid obesity (BMI 49.6); and Noncompliance on her problem list.    Review of Systems  Constitutional: Positive for unexpected weight change. Negative for activity change, appetite change, chills, diaphoresis, fatigue and fever.  HENT: Positive for congestion. Negative for ear discharge, ear  pain, postnasal drip, rhinorrhea, sinus pressure, sore throat and trouble swallowing.   Eyes: Negative.   Respiratory: Positive for apnea, chest tightness, shortness of breath and wheezing. Negative for cough.   Cardiovascular: Positive for leg swelling. Negative for chest pain and palpitations.  Gastrointestinal: Positive for abdominal distention. Negative for abdominal pain, anal bleeding, blood in stool, constipation, diarrhea, nausea, rectal pain and vomiting.  Genitourinary: Negative.   Musculoskeletal: Negative.   Skin: Negative.  Negative for rash.  Neurological: Negative.   Psychiatric/Behavioral: Positive for decreased concentration, dysphoric mood and sleep disturbance.       Objective:   Physical Exam General Appearance: Well nourished, obese, in no apparent distress. Eyes: PERRLA, EOMs, conjunctiva no swelling or erythema Sinuses: No Frontal/maxillary tenderness ENT/Mouth: Ext aud canals clear, TMs without erythema, bulging. No erythema, swelling, or exudate on post pharynx.  Tonsils not swollen or erythematous. Hearing normal. Crowded mouth Neck: Supple, thyroid normal.  Respiratory: Decreased breath sounds bilateral, with wheezing and decreased breath sounds bilateral lower lobes Cardio: Irreg, irreg,  2-3 + edema Abdomen: Soft, + BS, obese,  Non tender, no guarding, rebound, hernias, masses. Lymphatics: Non tender without lymphadenopathy.  Musculoskeletal: Full ROM, 5/5 strength, antalgic gait with cane Skin: Warm, dry without rashes, lesions, ecchymosis.  Neuro: Cranial nerves intact. No cerebellar symptoms.  Psych: Awake and oriented X 3, tearful, Insight and Judgment appropriate.

## 2016-07-29 ENCOUNTER — Encounter: Payer: Self-pay | Admitting: Physician Assistant

## 2016-07-29 ENCOUNTER — Ambulatory Visit (INDEPENDENT_AMBULATORY_CARE_PROVIDER_SITE_OTHER): Payer: BLUE CROSS/BLUE SHIELD | Admitting: Physician Assistant

## 2016-07-29 VITALS — BP 128/76 | HR 88 | Temp 98.1°F | Resp 18 | Ht 59.5 in | Wt 233.0 lb

## 2016-07-29 DIAGNOSIS — F329 Major depressive disorder, single episode, unspecified: Secondary | ICD-10-CM | POA: Diagnosis not present

## 2016-07-29 DIAGNOSIS — I482 Chronic atrial fibrillation, unspecified: Secondary | ICD-10-CM

## 2016-07-29 DIAGNOSIS — I159 Secondary hypertension, unspecified: Secondary | ICD-10-CM

## 2016-07-29 DIAGNOSIS — I5032 Chronic diastolic (congestive) heart failure: Secondary | ICD-10-CM | POA: Diagnosis not present

## 2016-07-29 DIAGNOSIS — G4733 Obstructive sleep apnea (adult) (pediatric): Secondary | ICD-10-CM

## 2016-07-29 DIAGNOSIS — J449 Chronic obstructive pulmonary disease, unspecified: Secondary | ICD-10-CM | POA: Diagnosis not present

## 2016-07-29 DIAGNOSIS — E785 Hyperlipidemia, unspecified: Secondary | ICD-10-CM

## 2016-07-29 DIAGNOSIS — N184 Chronic kidney disease, stage 4 (severe): Secondary | ICD-10-CM | POA: Diagnosis not present

## 2016-07-29 DIAGNOSIS — F32A Depression, unspecified: Secondary | ICD-10-CM

## 2016-07-29 DIAGNOSIS — R0600 Dyspnea, unspecified: Secondary | ICD-10-CM | POA: Diagnosis not present

## 2016-07-29 LAB — CBC WITH DIFFERENTIAL/PLATELET
Basophils Absolute: 0 cells/uL (ref 0–200)
Basophils Relative: 0 %
Eosinophils Absolute: 86 cells/uL (ref 15–500)
Eosinophils Relative: 1 %
HCT: 40.4 % (ref 35.0–45.0)
Hemoglobin: 12.8 g/dL (ref 11.7–15.5)
Lymphocytes Relative: 23 %
Lymphs Abs: 1978 cells/uL (ref 850–3900)
MCH: 28.7 pg (ref 27.0–33.0)
MCHC: 31.7 g/dL — ABNORMAL LOW (ref 32.0–36.0)
MCV: 90.6 fL (ref 80.0–100.0)
MPV: 11.5 fL (ref 7.5–12.5)
Monocytes Absolute: 602 cells/uL (ref 200–950)
Monocytes Relative: 7 %
Neutro Abs: 5934 cells/uL (ref 1500–7800)
Neutrophils Relative %: 69 %
Platelets: 263 10*3/uL (ref 140–400)
RBC: 4.46 MIL/uL (ref 3.80–5.10)
RDW: 15 % (ref 11.0–15.0)
WBC: 8.6 10*3/uL (ref 3.8–10.8)

## 2016-07-29 MED ORDER — ALBUTEROL SULFATE HFA 108 (90 BASE) MCG/ACT IN AERS: 2.0000 | INHALATION_SPRAY | RESPIRATORY_TRACT | 0 refills | Status: DC | PRN

## 2016-07-29 MED ORDER — PREDNISONE 20 MG PO TABS: ORAL_TABLET | ORAL | 0 refills | Status: DC

## 2016-07-29 MED ORDER — CITALOPRAM HYDROBROMIDE 40 MG PO TABS: 40.0000 mg | ORAL_TABLET | Freq: Every day | ORAL | 2 refills | Status: DC

## 2016-07-29 MED ORDER — IPRATROPIUM-ALBUTEROL 0.5-2.5 (3) MG/3ML IN SOLN: 3.0000 mL | Freq: Once | RESPIRATORY_TRACT | Status: DC

## 2016-07-29 MED ORDER — ALBUTEROL SULFATE (2.5 MG/3ML) 0.083% IN NEBU: 2.5000 mg | INHALATION_SOLUTION | Freq: Four times a day (QID) | RESPIRATORY_TRACT | 12 refills | Status: DC | PRN

## 2016-07-29 NOTE — Patient Instructions (Addendum)
Can take 133m of the trazodone at night for sleep but CPAP would be the best  Do inhaler once a day before you brush your teeth  Cologuard is an easy to use noninvasive colon cancer screening test based on the latest advances in stool DNA science.   Colon cancer is 3rd most diagnosed cancer and 2nd leading cause of death in both men and women 563years of age and older despite being one of the most preventable and treatable cancers if found early.  4 of out 5 people diagnosed with colon cancer have NO prior family history.  When caught EARLY 90% of colon cancer is curable.   You have agreed to do a Cologuard screening and have declined a colonoscopy in spite of being explained the risks and benefits of the colonoscopy in detail, including cancer and death. Please understand that this is test not as sensitive or specific as a colonoscopy and you are still recommended to get a colonoscopy.   If you are NOT medicare please call your insurance company and give them these items to see if they will cover it: 1) CPT code, [id number]) Provider is EProbation officer3) [employer] NPI #[id number]) [employer] ID #[id number] Out-of-pocket cost for Cologuard can range from $0 - $649 so please call  You will receive a short call from [facility]ort center at [employer] when you receive a call they will say they are from [employer]  to confirm your mailing address and give you more information.  When they calll you, it will appear on the caller ID as "[employer]" or in some cases only this number will appear, [phone]   [employer] S[employer] Companieswill ship your collection kit directly to you. You will collect a single stool sample in the privacy of your own home, no special preparation required. You will return the kit via [facility]-paid shipping or pick-up, in the same box it arrived in. Then I will contact you to discuss your results after I  receive them from the laboratory.   If you have any questions or concerns, Cologuard Customer Support Specialist are available 24 hours a day, 7 days a week at [phone] go to [email or web address]

## 2016-07-30 LAB — BASIC METABOLIC PANEL WITH GFR
BUN: 18 mg/dL (ref 7–25)
CO2: 32 mmol/L — ABNORMAL HIGH (ref 20–31)
Calcium: 9.1 mg/dL (ref 8.6–10.4)
Chloride: 101 mmol/L (ref 98–110)
Creat: 1.47 mg/dL — ABNORMAL HIGH (ref 0.50–1.05)
GFR, Est African American: 45 mL/min — ABNORMAL LOW (ref >=60)
GFR, Est Non African American: 39 mL/min — ABNORMAL LOW (ref >=60)
Glucose, Bld: 89 mg/dL (ref 65–99)
Potassium: 3.9 mmol/L (ref 3.5–5.3)
Sodium: 144 mmol/L (ref 135–146)

## 2016-07-30 LAB — LIPID PANEL
Cholesterol: 141 mg/dL (ref <200)
HDL: 47 mg/dL — ABNORMAL LOW (ref >50)
LDL Cholesterol: 72 mg/dL (ref <100)
Total CHOL/HDL Ratio: 3 Ratio (ref <5.0)
Triglycerides: 111 mg/dL (ref <150)
VLDL: 22 mg/dL (ref <30)

## 2016-07-30 LAB — BRAIN NATRIURETIC PEPTIDE: Brain Natriuretic Peptide: 259.4 pg/mL — ABNORMAL HIGH (ref <100)

## 2016-07-30 LAB — HEPATIC FUNCTION PANEL
ALT: 19 U/L (ref 6–29)
AST: 15 U/L (ref 10–35)
Albumin: 4 g/dL (ref 3.6–5.1)
Alkaline Phosphatase: 100 U/L (ref 33–130)
Bilirubin, Direct: 0.1 mg/dL (ref <=0.2)
Indirect Bilirubin: 0.3 mg/dL (ref 0.2–1.2)
Total Bilirubin: 0.4 mg/dL (ref 0.2–1.2)
Total Protein: 6.8 g/dL (ref 6.1–8.1)

## 2016-08-04 ENCOUNTER — Ambulatory Visit (INDEPENDENT_AMBULATORY_CARE_PROVIDER_SITE_OTHER): Payer: BLUE CROSS/BLUE SHIELD | Admitting: Neurology

## 2016-08-04 ENCOUNTER — Encounter: Payer: Self-pay | Admitting: Neurology

## 2016-08-04 VITALS — BP 103/62 | HR 103 | Resp 20 | Ht 59.75 in | Wt 238.0 lb

## 2016-08-04 DIAGNOSIS — I2723 Pulmonary hypertension due to lung diseases and hypoxia: Secondary | ICD-10-CM | POA: Diagnosis not present

## 2016-08-04 DIAGNOSIS — I481 Persistent atrial fibrillation: Secondary | ICD-10-CM | POA: Diagnosis not present

## 2016-08-04 DIAGNOSIS — N1832 Chronic kidney disease, stage 3b: Secondary | ICD-10-CM | POA: Insufficient documentation

## 2016-08-04 DIAGNOSIS — J449 Chronic obstructive pulmonary disease, unspecified: Secondary | ICD-10-CM

## 2016-08-04 DIAGNOSIS — I5033 Acute on chronic diastolic (congestive) heart failure: Secondary | ICD-10-CM

## 2016-08-04 DIAGNOSIS — I4819 Other persistent atrial fibrillation: Secondary | ICD-10-CM

## 2016-08-04 DIAGNOSIS — N184 Chronic kidney disease, stage 4 (severe): Secondary | ICD-10-CM | POA: Diagnosis not present

## 2016-08-04 DIAGNOSIS — E1122 Type 2 diabetes mellitus with diabetic chronic kidney disease: Secondary | ICD-10-CM | POA: Diagnosis not present

## 2016-08-04 DIAGNOSIS — G4733 Obstructive sleep apnea (adult) (pediatric): Secondary | ICD-10-CM | POA: Diagnosis not present

## 2016-08-04 NOTE — Progress Notes (Signed)
SLEEP MEDICINE CLINIC   Provider:  Larey Seat, M D  Primary Care Physician:  Unk Pinto, MD   Referring Provider: Unk Pinto, MD    Chief Complaint  Patient presents with  . New Patient (Initial Visit)    snoring, had sleep study years ago that confirmed osa, has been on cpap, does not have cpap currently    HPI:  [name] is a 56 y.o. female , seen here as in a referral from Dr. Idell Pickles PA Vicie Mutters.  This is my first encounter with [name] on 08/04/2016. The patient reports that she was diagnosed with sleep apnea at an AHI of 77, at the time at [address]. This sleep evaluation was initiated after her daughter had witnessed the patient having apnea during a hospitalization, she stayed at her bedside. The patient has an extensive and significant medical history I would like to introduce her past and current medical history as it makes the evaluation of sleep apnea urgent. She has chronic diastolic heart failure with water retention, shortness of breath, and orthopnea. She has chronic atrial fibrillation but is rate controlled, she has secondary hypertension she has chronic obstructive pulmonary disease and requires nebulizers and breathing treatments. She is considered super obese at a BMI of 50. CKD grade 4 with a GFR of 20.   [name] has presented with fatigue and weight gain and her primary care physician once urgently to put her back on CPAP. Her cardiologist is Dr. Britta Mccreedy long at the heart failure clinic, who is equally interested in getting her back on positive airway pressure especially since COPD overlap is also present. I would like to add that the patient experienced pulmonary embolism during an attack of atrial flutter and that she has hypothyroidism as well, anemia and poorly controlled diabetes type 2. Her review of symptoms is positive for weight gain and fatigue palpitations leg swelling hearing  loss and tinnitus constipation joint pain aching muscles, weakness, numbness, confusion, dizziness, sleepiness, decreased energy and is interested activities.   Sleep habits are as follows: The patient takes 142mg of trazodone to allow her to sleep, otherwise she states her mind is racing and she cannot find a way to sleep. This is around 8 PM when she takes the medication, bedtime is around 9:30 PM and the medication has started to work. She stays asleep for about 2-3 hours -her sleep is fragmented by bathroom breaks up to 4 at night. She looks much better in a chair or with her back of the bed elevated that in bed. And her sleep usually lasts longer and is less fragmented. But her legs swell more. She does report vivid dreams. She does not report palpitations, diaphoresis, nausea or dizziness at night. When she sleeps in bed she sleeps on her side, on multiple pillows for head support. Her alarm rings at 6:30 and she usually is ready to leave the house by 7.15 AM.    Sleep medical history and family sleep history:  The grandson of [name] through her daughter was diagnosed with an X chromosomal linked disease. Her medical history is explicitly described in the first chapter of this note. She is no longer owning a CPAP.   Social history: The patient is separated from her husband, lives with her youngest son. She has 4 living children. She quit smoking in 2012, she does not drink alcohol now, she does not drink ice teas, very rarely drinks soda  and she does not drink coffee. She works from 8 AM to 4:40 PM, has no shift work history.  Review of Systems: Out of a complete 14 system review, the patient complains of only the following symptoms, and all other reviewed systems are negative.  see above , snoring, witnessed apnea, SOB, orthopnea. Restless legs .  Epworth score 14 , Fatigue severity score 59   , depression score n/a    Social History   Social History  . Marital status: Legally  Separated    Spouse name: n/a  . Number of children: 4  . Years of education: 12   Occupational History  . clerical     [place] History Main Topics  . Smoking status: Former Smoker    Packs/day: 2.00    Years: 35.00    Types: Cigarettes    Quit date: 09/08/2009  . Smokeless tobacco: Never Used  . Alcohol use No  . Drug use: No  . Sexual activity: No   Other Topics Concern  . Not on file   Social History Narrative   Married but currently separated with 4 children, 2 of whom still live with her.  Youngest son is a Equities trader in [employer], to graduate in 07/2012.    Family History  Problem Relation Age of Onset  . Colon cancer Mother   . Coronary artery disease Mother   . Coronary artery disease Father   . Asthma Father   . Emphysema Father   . Allergies Father   . Heart attack Maternal Grandfather   . Heart disease Maternal Grandmother   . Depression Daughter     Past Medical History:  Diagnosis Date  . Anemia   . Anxiety   . Atrial flutter ([place])   . Borderline diabetes   . Chronic diastolic heart failure ([place])   . COPD (chronic obstructive pulmonary disease) ([place])   . Depression   . GERD (gastroesophageal reflux disease)   . HTN (hypertension)   . Hyperlipidemia   . Obstructive sleep apnea   . Persistent atrial fibrillation ([place])    Myoview 4/09: No ischemia;  echo 4/09: EF 60-65%;   Flecainide, beta blocker, Coumadin;    Unable to take Tikosyn 2/2 prolonged QT  . Pulmonary embolism (H[facility])    Right lower lobe diagnosed by CT 6/12  . Type II or unspecified type diabetes mellitus without mention of complication, not stated as uncontrolled     Past Surgical History:  Procedure Laterality Date  . CARDIOVERSION  01/19/2012   Procedure: CARDIOVERSION;  Surgeon: Jolaine Artist, MD;  Location: [facility] ENDOSCOPY;  Service: Cardiovascular;  Laterality: N/A;  . CARDIOVERSION  01/21/2012   Procedure: CARDIOVERSION;  Surgeon: Carlena Bjornstad, MD;  Location: [place];  Service: Cardiovascular;  Laterality: N/A;  Rm [id number]  . TEE WITHOUT CARDIOVERSION  01/19/2012   Procedure: TRANSESOPHAGEAL ECHOCARDIOGRAM (TEE);  Surgeon: Jolaine Artist, MD;  Location: [facility] ENDOSCOPY;  Service: Cardiovascular;  Laterality: N/A;  . TUBAL LIGATION  1996    Current Outpatient Prescriptions  Medication Sig Dispense Refill  . albuterol (PROVENTIL) (2.5 MG/3ML) 0.083% nebulizer solution Take 3 mLs (2.5 mg total) by nebulization every 6 (six) hours as needed for wheezing or shortness of breath. 75 mL 12  . albuterol (VENTOLIN HFA) 108 (90 Base) MCG/ACT inhaler Inhale 2 puffs into the lungs every 4 (four) hours as needed for wheezing or shortness of breath. 1 Inhaler 0  . ALPRAZolam (XANAX) 0.5 MG tablet 1/2-1  pill PRN before procedure, if need to can take 1 at one time. Do not drive if taking. 30 tablet 0  . aspirin-acetaminophen-caffeine (EXCEDRIN MIGRAINE) 917-915-05 MG per tablet Take 1 tablet by mouth every 6 (six) hours as needed for headache.    Marland Kitchen atorvastatin (LIPITOR) 20 MG tablet Take 1 tablet (20 mg total) by mouth daily. 30 tablet 11  . bisoprolol (ZEBETA) 5 MG tablet Take 1 tablet (5 mg total) by mouth daily. 90 tablet 3  . citalopram (CELEXA) 40 MG tablet Take 1 tablet (40 mg total) by mouth daily. 30 tablet 2  . diltiazem (CARDIZEM CD) 360 MG 24 hr capsule Take 1 capsule (360 mg total) by mouth daily. 90 capsule 3  . furosemide (LASIX) 40 MG tablet Take 1 tablet (40 mg total) by mouth daily. Take an additional 40 mg tablet for weight gain/leg swelling 120 tablet 3  . metolazone (ZAROXOLYN) 2.5 MG tablet As needed for increase in swelling 90 tablet 3  . naproxen sodium (ANAPROX) 220 MG tablet Take 220 mg by mouth 2 (two) times daily as needed (back pain).    Marland Kitchen OVER THE COUNTER MEDICATION as needed. Equate Night time sleep aid    . OVER THE COUNTER MEDICATION Take 100 mg by mouth at bedtime. Equate sleep medicine    . potassium chloride (K-DUR,KLOR-CON) 10 MEQ  tablet Take 2 tablets (20 mEq total) by mouth 2 (two) times daily. Take additional 20 meq (2 tablets) if take additional furosemide 40 mg tablet 400 tablet 2  . predniSONE (DELTASONE) 20 MG tablet 2 tablets daily for 3 days, 1 tablet daily for 4 days. 10 tablet 0  . rivaroxaban (XARELTO) 20 MG TABS tablet Take 1 tablet (20 mg total) by mouth daily with supper. 90 tablet 3  . traZODone (DESYREL) 100 MG tablet 1/2-1 tablet for sleep 90 tablet 0   Current Facility-Administered Medications  Medication Dose Route Frequency Provider Last Rate Last Dose  . ipratropium-albuterol (DUONEB) 0.5-2.5 (3) MG/3ML nebulizer solution 3 mL  3 mL Nebulization Once Vicie Mutters, PA-C        Allergies as of 08/04/2016 - Review Complete 08/04/2016  Allergen Reaction Noted  . Sulfonamide derivatives Hives and Itching 07/20/2007  . Tikosyn [dofetilide]  09/05/2013    Vitals: BP 103/62   Pulse (!) 103   Resp 20   Ht 4' 11.75" (1.518 m)   Wt 238 lb (108 kg)   BMI 46.87 kg/m  Last Weight:  Wt Readings from Last 1 Encounters:  08/04/16 238 lb (108 kg)   WPV:XYIA mass index is 46.87 kg/m.     Last Height:   Ht Readings from Last 1 Encounters:  08/04/16 4' 11.75" (1.518 m)    Physical exam:  General: The patient is awake, alert and appears not in acute distress. The patient is well groomed. Head: Normocephalic, atraumatic. Neck is supple. Mallampati 5,  neck circumference: 16.5 . Nasal airflow patent , TMJ is evident . Retrognathia is not seen.  Cardiovascular:  irregular rate and rhythm, without  murmurs or carotid bruit, and without distended neck veins. Respiratory: Lungs are congested, wheezing.  Skin:  Evidence of ankle edema, or rash- she has a melanotic  Skin s lesions on the left side of the nose Trunk: BMI is 47. The patient's posture is erect    Neurologic exam : The patient is awake and alert, oriented to place and time.   Memory subjective described as impaired for time frame memory. .  Attention  span & concentration ability appears normal. Speech is fluent,  without dysarthria, dysphonia or aphasia. Mood and affect are appropriate.  Cranial nerves: Pupils are equal and briskly reactive to light. Funduscopic exam withevidence of beginning cataract , possibly pallor or edema.  Extraocular movements  in vertical and horizontal planes intact and without nystagmus. Visual fields by finger perimetry are intact. Hearing to finger rub intact.  Facial sensation intact to fine touch. Facial motor strength is symmetric and tongue and uvula move midline. Shoulder shrug was symmetrical.  Motor exam:  Normal tone, muscle bulk and symmetric strength in all extremities. The patient has hyper-extendable joints.  Sensory:  Fine touch, pinprick and vibration were impaired in both feet, up to midcalf. Proprioception tested in the upper extremities was normal. Coordination: Rapid alternating movements in the fingers/hands was normal. Finger to-nose maneuver  normal without evidence of ataxia, dysmetria or tremor. Gait and station: Patient walks without assistive device and is able unassisted to climb up to the exam table. Orthopnea- SOB with just walking 4-5 steps.  Strength within normal limits.Stance is stable and normal.   Deep tendon reflexes: in the  upper and lower extremities are symmetric and intact. Babinski maneuver response is downgoing. Assessment:  After physical and neurologic examination, review of laboratory studies,  Personal review of imaging studies, reports of other /same  Imaging studies, results of polysomnography and / or neurophysiology testing and pre-existing records as far as provided in visit., my assessment is   1) [name] needs an urgent attended split-night polysomnography to confirm her degree of apnea, the associated hypoxemia and hypercapnia as expected given her multiple comorbidities and the severity of her comorbidities. If the patient responds to CPAP we will  titrate her to the device, but I will make her aware that she may need BiPAP or assisted servo ventilation in the treatment of sleep apnea in a patient with congestive heart failure, advanced chronic renal failure, chronic atrial fibrillation, COPD and pulmonary embolism with pulmonary hypertension.  2) super obesity can benefit from nutrition counseling, DASH diet. She needs to reduce salt intake and carb intake.   3) [name] need possibly oxygen - I advise the technologist attending the sleep study to titrate her if  PAP alone  will not affect the nadir enough . CAPNOGRAPHY and OXYGEN to be measured in baseline part of SPLIT study.    The patient was advised of the nature of the diagnosed disorder , the treatment options and the  risks for general health and wellness arising from not treating the condition.   I spent more than 60 minutes of face to face time with the patient.  Greater than 50% of time was spent in counseling and coordination of care. We have discussed the diagnosis and differential and I answered the patient's questions.      Larey Seat, MD 1/44/3601, 6:58 AM  Certified in Neurology by ABPN Certified in [place] by [facility] [address] [place], [place] [address]

## 2016-08-04 NOTE — Patient Instructions (Signed)

## 2016-08-20 ENCOUNTER — Ambulatory Visit (INDEPENDENT_AMBULATORY_CARE_PROVIDER_SITE_OTHER): Payer: BLUE CROSS/BLUE SHIELD | Admitting: Neurology

## 2016-08-20 DIAGNOSIS — J449 Chronic obstructive pulmonary disease, unspecified: Secondary | ICD-10-CM

## 2016-08-20 DIAGNOSIS — E1122 Type 2 diabetes mellitus with diabetic chronic kidney disease: Secondary | ICD-10-CM

## 2016-08-20 DIAGNOSIS — G4733 Obstructive sleep apnea (adult) (pediatric): Secondary | ICD-10-CM | POA: Diagnosis not present

## 2016-08-20 DIAGNOSIS — I2723 Pulmonary hypertension due to lung diseases and hypoxia: Secondary | ICD-10-CM

## 2016-08-20 DIAGNOSIS — N184 Chronic kidney disease, stage 4 (severe): Secondary | ICD-10-CM

## 2016-08-20 DIAGNOSIS — I5033 Acute on chronic diastolic (congestive) heart failure: Secondary | ICD-10-CM

## 2016-08-20 DIAGNOSIS — I4819 Other persistent atrial fibrillation: Secondary | ICD-10-CM

## 2016-08-31 NOTE — Procedures (Signed)
PATIENT'S NAME:  [name], [name] DOB:      February 16, 1960      MR#:    [id number]     DATE OF RECORDING: 08/20/2016 REFERRING M.D.:  Unk Pinto, MD Study Performed:  Split-Night Titration Study HISTORY:  Mrs. Morson reports that she was diagnosed at [place] with OSA at an AHI of 77/hr. This sleep evaluation was initiated after her daughter had witnessed the patient having apnea during a hospitalization, witnessed at her bedside. The patient has an extensive and significant medical history -which makes the evaluation of sleep apnea urgent. She has chronic diastolic heart failure with water retention, shortness of breath, and orthopnea. She has chronic atrial fibrillation but is rate controlled, she has secondary hypertension she has chronic obstructive pulmonary disease and requires nebulizers and breathing treatments. She is considered super obese at a BMI close to 50.  The patient endorsed the Epworth Sleepiness Scale at 14 points   The patient's weight 238 pounds with a height of 60 (inches), resulting in a BMI of 46.7 kg/m2. The patient's neck circumference measured 16.5 inches.  CURRENT MEDICATIONS: Proventil, Ventolin, Xanax, Excedrin migraine, Lipitor, Zebeta, Celexa, Cardizem, Lasix, Zaroxolyn, Anaprox, Klor-Con, Deltasone, Xarelto, Desyrel, Duoneb    PROCEDURE:  This is a multichannel digital polysomnogram utilizing the SomnoStar 11.2 system.  Electrodes and sensors were applied and monitored per AASM Specifications.   EEG, EOG, Chin and Limb EMG, were sampled at 200 Hz.  ECG, Snore and Nasal Pressure, Thermal Airflow, Respiratory Effort, CPAP Flow and Pressure, Oximetry was sampled at 50 Hz. Digital video and audio were recorded.      BASELINE STUDY WITHOUT CPAP RESULTS:  Lights Out was at 21:12 and Lights On at 05:17.  Total recording time (TRT) was 198.5, with a total sleep time (TST) of 141.5 minutes.   The patient's sleep latency was 70 minutes.  REM latency was 0 minutes.  The  sleep efficiency was 71.3 %.    SLEEP ARCHITECTURE: WASO (Wake after sleep onset) was 32 minutes, Stage N1 was 8 minutes, Stage N2 was 110 minutes, Stage N3 was 23.5 minutes and Stage R (REM sleep) was 0 minutes.  The percentages were Stage N1 5.7%, Stage N2 77.7%, Stage N3 16.6% and Stage R (REM sleep) 0%.   RESPIRATORY ANALYSIS:  There were a total of 172 respiratory events:  37 obstructive apneas, 0 central apneas and 0 mixed apneas with a total of 37 apneas and an apnea index (AI) of 15.7. There were 135 hypopneas with a hypopnea index of 57.2. The patient also had 0 respiratory event related arousals (RERAs).  Snoring was noted.    The total APNEA/HYPOPNEA INDEX (AHI) was 72.9 /hour and the total RESPIRATORY DISTURBANCE INDEX was 72.9 /hour.  0 events occurred in REM sleep and 270 events in NREM. The patient spent 0 minutes sleep time in the supine position .  OXYGEN SATURATION & C02:  The wake baseline 02 saturation was 92%, with the lowest being 76%. Time spent below 89% saturation equaled 127 minutes. End Tidal CO2 during sleep was 36.6 torr.  Prior to CPAP, the average End Tidal CO2 during sleep 36 torr.   PERIODIC LIMB MOVEMENTS:    The patient had a total of 0 Periodic Limb Movements.  The arousals were noted as: 17 were spontaneous, 0 were associated with PLMs, and 158 were associated with respiratory events.  Audio and video analysis did not show any abnormal or unusual movements, behaviors, phonations or vocalizations. The patient took one  bathroom break. Moderate Snoring was noted EKG was in atrial fibrillation, and PVCs.   TITRATION STUDY WITH CPAP RESULTS:   CPAP was initiated at 5 cmH20 with heated humidity per AASM split night standards and pressure was advanced to 12 cmH20 because of hypopneas, apneas and desaturations.  At a PAP pressure of 12 cmH20 and 4 liters oxygen, there was a reduction of the AHI to 00 /hour and the nadir rose to 87% .  Total recording time (TRT) was  287 minutes, with a total sleep time (TST) of 262.5 minutes. The patient's sleep latency was 21 minutes. REM latency was 102 minutes.  The sleep efficiency was 91.5 %.    SLEEP ARCHITECTURE: Wake after sleep was 3.5 minutes, Stage N1 1 minutes, Stage N2 80.5 minutes, Stage N3 19 minutes and Stage R (REM sleep) 162 minutes. The percentages were: Stage N1 .4%, Stage N2 30.7%, Stage N3 7.2% and Stage R (REM sleep) 61.7%.   RESPIRATORY ANALYSIS:  There were a total of 23 respiratory events: 13 obstructive apneas, 0 central apneas and 0 mixed apneas with a total of 13 apneas and an apnea index (AI) of 3.. There were 10 hypopneas with a hypopnea index of 2.3 /hour. The patient also had 0 respiratory event related arousals (RERAs).     The total APNEA/HYPOPNEA INDEX (AHI) was 5.3 /hour and the total RESPIRATORY DISTURBANCE INDEX was 5.3 /hour.  5 events occurred in REM sleep and 18 events in NREM. The REM AHI was 1.9 /hour versus a non-REM AHI of 10.7 /hour. The patient spent 0% of total sleep time in the supine position. The supine AHI was 0.0 /hour, versus a non-supine AHI of 5.3/hour.  OXYGEN SATURATION & C02:  The wake baseline 02 saturation was 92%, with the lowest being 78%. Time spent below 89% saturation equaled 163 minutes. Supplemental oxygen was added and titrated to 4 liters.   PERIODIC LIMB MOVEMENTS:    The patient had a total of 0 Periodic Limb Movements. The arousals were noted as: 7 were spontaneous, 0 were associated with PLMs, and 14 were associated with respiratory events.  Post-study, the patient indicated that sleep was the same as usual.  POLYSOMNOGRAPHY IMPRESSION :   1. Severe Obstructive Sleep Apnea(OSA) at an AHI of 78.8/hr. and without REM sleep.  2. The patient experienced severe hypoxemia, prolonged hypoxia. Nadir was SpO2 76% and 127 minutes.  3. Snoring 4. Atrial fibrillation 5. Hypoxemia and hypercapnia are likely the result of hypoventilation.     RECOMMENDATIONS:   CPAP at 12 cm water  with 4 liter oxygen was alleviating sleep apnea to an AHI of 0.0 , a total sleep time of 91.5 minutes was recorded, with 99% sleep efficiency.   1. A follow up appointment will be scheduled in the Sleep Clinic at [facility].    2. The patient responded well to 12 cm water CPAP with 4 liter of oxygen , using a FFM Model Simplus in small size. The patient will receive a machine  that is BiPAP capable.     I certify that I have reviewed the entire raw data recording prior to the issuance of this report in accordance with the Standards of Accreditation of the American Academy of Sleep Medicine (AASM)     Larey Seat, M.D.  08-31-2016 Diplomat, American Board of Psychiatry and Neurology  Diplomat, [place] of Sleep Medicine Medical Director, [place] [facility]

## 2016-08-31 NOTE — Addendum Note (Signed)
Addended by: Larey Seat on: 08/31/2016 05:27 PM   Modules accepted: Orders

## 2016-09-01 ENCOUNTER — Telehealth: Payer: Self-pay | Admitting: Neurology

## 2016-09-01 NOTE — Telephone Encounter (Signed)
-----  Message from Larey Seat, MD sent at 08/31/2016  5:27 PM EDT ----- Severe sleep apnea with all the associated findings related to her comorbidities, : Hypoxemia, Hypercapnia, atrial fibrillation and super obesity. Patient responded well to 12 cm water CPAP with 4 liters of oxygen and was given a FFM Simplus in small by the tech.  I prefer for the patient to have a CPAP that can also serve as a BiPAP- if that can be furbished.

## 2016-09-01 NOTE — Telephone Encounter (Signed)
I called [name]. I advised [name] that Dr. Brett Fairy reviewed their sleep study results and found that [name] did have severe OSA. Dr. Brett Fairy recommends that [name] starts CPAP. I reviewed PAP compliance expectations with the [name]. [name] is agreeable to starting a CPAP. I advised [name] that an order will be sent to a DME, [facility], and [facility] will call the [name] within about one week after they file with the [name]'s insurance. [facility] will show the [name] how to use the machine, fit for masks, and troubleshoot the CPAP if needed. A follow up appt was made for insurance purposes with Cecille Rubin NP on Monday Oct 8th at 3:15pm. [name] verbalized understanding to arrive 15 minutes early and bring their CPAP. A letter with all of this information in it will be mailed to the [name] as a reminder. I verified with the [name] that the address we have on file is correct. [name] verbalized understanding of results. [name] had no questions at this time but was encouraged to call back if questions arise.

## 2016-09-02 ENCOUNTER — Encounter: Payer: Self-pay | Admitting: Physician Assistant

## 2016-09-03 ENCOUNTER — Other Ambulatory Visit: Payer: Self-pay | Admitting: Physician Assistant

## 2016-09-03 DIAGNOSIS — F329 Major depressive disorder, single episode, unspecified: Secondary | ICD-10-CM

## 2016-09-03 DIAGNOSIS — F32A Depression, unspecified: Secondary | ICD-10-CM

## 2016-09-15 ENCOUNTER — Other Ambulatory Visit: Payer: Self-pay | Admitting: Physician Assistant

## 2016-09-15 MED ORDER — PREDNISONE 20 MG PO TABS: ORAL_TABLET | ORAL | 0 refills | Status: DC

## 2016-09-15 NOTE — Progress Notes (Signed)
Future Appointments Date Time Provider [place]  09/21/2016 4:30 PM Vicie Mutters, PA-C [id number] None  11/15/2016 3:15 PM Dennie Bible, NP [id number] None

## 2016-09-20 NOTE — Progress Notes (Signed)
Assessment and Plan:   Chronic diastolic heart failure ([place]) Continue follow up cardio, weight down, continue weight loss  Chronic atrial fibrillation ([place]) Rate controlled, continue medications, xarelto Has follow up with cardiology coming up  Secondary hypertension - continue medications, DASH diet, exercise and monitor at home. Call if greater than 130/80.  -     CBC with Differential/Platelet -     BASIC METABOLIC PANEL WITH GFR -     Hepatic function panel -     TSH  Chronic obstructive pulmonary disease, unspecified COPD type ([place]) Continue weight loss, continue inhalers RX given and more smaples   Hyperlipidemia, unspecified hyperlipidemia type -check lipids, decrease fatty foods, increase activity.  -     Lipid panel  Morbid obesity (BMI 49.6) - long discussion about weight loss, diet, and exercise NO SODA  OSA (obstructive sleep apnea) with COPD overlap syndrome May need pressure adjusted versus bipap May not be able to use if has MOHS/skin surgery on nose  Depression, unspecified depression type - continue medications, stress management techniques discussed, increase water, good sleep hygiene discussed, increase exercise, and increase veggies.  - continue celexa  Basal cell left nostril Has been there x 2 years Getting removed tomorrow [facility] Slightly concerned about extent of deep invasion with symptoms of obstruction/congestion  Medication management -     Magnesium    Patient walked out without getting labs, will contact and bring back for lab only.  Continue diet and meds as discussed. Discussed med's effects and SE's.  Future Appointments Date Time Provider [place]  11/15/2016 3:15 PM Dennie Bible, NP [facility] None    HPI 56 y.o. female  presents for 3 month follow up with hypertension, hyperlipidemia, diabetes and vitamin D. Her blood pressure has been controlled at home, today their BP is BP: 118/78  She does not  workout. She denies chest pain, shortness of breath, dizziness.  She has a history of afib and CHF, she is on amiodarone, cardizem, she is on xarleto, following with Dr. Haroldine Laws.  She has been having sinus congestion x 3 weeks, has used prednisone, flonase and it is not helping, still has some congestion,but no sinus pain, no fever, chills. She is getting potential basalcell removed tomorrow with Dr. Jarome Matin at skin surgery center from left nose that she is hoping will help with her breathing. .  She had AKI with CKD but kidney function had improved last OV, still advised to see nephrology.  Lab Results  Component Value Date   GFRNONAA 21 (L) 07/29/2016   She also has PHTN due to COPD and OSA, she is being set up for a CPAP but has not been wearing it due to sinus congestion and feels that it is suffocating her, unable to handle so much pressure. She is on trelegy x 2 months, will give samples and RX  She is not on cholesterol medication and denies myalgias. Her cholesterol is not at goal. The cholesterol was: Lab Results  Component Value Date   CHOL 141 07/29/2016   HDL 47 (L) 07/29/2016   LDLCALC 72 07/29/2016   TRIG 111 07/29/2016   CHOLHDL 3.0 07/29/2016     She has been working on diet and exercise for Diabetes, and denies polydipsia, polyuria and visual disturbances. Last A1C  was:  Lab Results  Component Value Date   HGBA1C 5.8 (H) 06/09/2016   Patient is on Vitamin D supplement. She is on thyroid medication. Her medication was not changed  last visit.  Lab Results  Component Value Date   TSH 4.08 06/09/2016  .  BMI is Body mass index is 45.93 kg/m.,  She has depression was started on celexa last OV that she states is helping.  Wt Readings from Last 3 Encounters:  09/21/16 233 lb 3.2 oz (105.8 kg)  08/04/16 238 lb (108 kg)  07/29/16 233 lb (105.7 kg)     Current Medications:  Current Outpatient Prescriptions on File Prior to Visit  Medication Sig  . albuterol  (PROVENTIL) (2.5 MG/3ML) 0.083% nebulizer solution Take 3 mLs (2.5 mg total) by nebulization every 6 (six) hours as needed for wheezing or shortness of breath.  Marland Kitchen albuterol (VENTOLIN HFA) 108 (90 Base) MCG/ACT inhaler Inhale 2 puffs into the lungs every 4 (four) hours as needed for wheezing or shortness of breath.  . ALPRAZolam (XANAX) 0.5 MG tablet 1/2-1 pill PRN before procedure, if need to can take 1 at one time. Do not drive if taking.  Marland Kitchen aspirin-acetaminophen-caffeine (EXCEDRIN MIGRAINE) 250-250-65 MG per tablet Take 1 tablet by mouth every 6 (six) hours as needed for headache.  Marland Kitchen atorvastatin (LIPITOR) 20 MG tablet Take 1 tablet (20 mg total) by mouth daily.  . bisoprolol (ZEBETA) 5 MG tablet Take 1 tablet (5 mg total) by mouth daily.  . citalopram (CELEXA) 40 MG tablet Take 1 tablet (40 mg total) by mouth daily.  Marland Kitchen diltiazem (CARDIZEM CD) 360 MG 24 hr capsule Take 1 capsule (360 mg total) by mouth daily.  . furosemide (LASIX) 40 MG tablet Take 1 tablet (40 mg total) by mouth daily. Take an additional 40 mg tablet for weight gain/leg swelling  . metolazone (ZAROXOLYN) 2.5 MG tablet As needed for increase in swelling  . naproxen sodium (ANAPROX) 220 MG tablet Take 220 mg by mouth 2 (two) times daily as needed (back pain).  Marland Kitchen OVER THE COUNTER MEDICATION as needed. Equate Night time sleep aid  . OVER THE COUNTER MEDICATION Take 100 mg by mouth at bedtime. Equate sleep medicine  . potassium chloride (K-DUR,KLOR-CON) 10 MEQ tablet Take 2 tablets (20 mEq total) by mouth 2 (two) times daily. Take additional 20 meq (2 tablets) if take additional furosemide 40 mg tablet  . rivaroxaban (XARELTO) 20 MG TABS tablet Take 1 tablet (20 mg total) by mouth daily with supper.  . traZODone (DESYREL) 100 MG tablet TAKE 1/2-1 TABLET BY MOUTH FOR SLEEP   Current Facility-Administered Medications on File Prior to Visit  Medication  . ipratropium-albuterol (DUONEB) 0.5-2.5 (3) MG/3ML nebulizer solution 3 mL    Medical History:  Past Medical History:  Diagnosis Date  . Anemia   . Anxiety   . Atrial flutter ([place])   . Borderline diabetes   . Chronic diastolic heart failure ([place])   . COPD (chronic obstructive pulmonary disease) ([place])   . Depression   . GERD (gastroesophageal reflux disease)   . HTN (hypertension)   . Hyperlipidemia   . Obstructive sleep apnea   . Persistent atrial fibrillation ([place])    Myoview 4/09: No ischemia;  echo 4/09: EF 60-65%;   Flecainide, beta blocker, Coumadin;    Unable to take Tikosyn 2/2 prolonged QT  . Pulmonary embolism (HCC)    Right lower lobe diagnosed by CT 6/12  . Type II or unspecified type diabetes mellitus without mention of complication, not stated as uncontrolled    Allergies:  Allergies  Allergen Reactions  . Sulfonamide Derivatives Hives and Itching  . Tikosyn [Dofetilide]  Prolonged qt    Review of Systems:  See HPI  Family history- Review and unchanged Social history- Review and unchanged Physical Exam: BP 118/78   Pulse (!) 102   Temp (!) 97.5 F (36.4 C)   Resp 18   Ht 4' 11.75" (1.518 m)   Wt 233 lb 3.2 oz (105.8 kg)   SpO2 95%   BMI 45.93 kg/m  Wt Readings from Last 3 Encounters:  09/21/16 233 lb 3.2 oz (105.8 kg)  08/04/16 238 lb (108 kg)  07/29/16 233 lb (105.7 kg)   General Appearance: Well nourished, obese, in no apparent distress. Eyes: PERRLA, EOMs, conjunctiva no swelling or erythema Sinuses: No Frontal/maxillary tenderness ENT/Mouth: Ext aud canals clear, TMs without erythema, bulging. No erythema, swelling, or exudate on post pharynx. Left nasal passage swollen with mucus and gray/black obstruction. Tonsils not swollen or erythematous. Hearing normal. Crowded mouth Neck: Supple, thyroid normal.  Respiratory: Decreased breath sounds bilateral, with wheezing and decreased breath sounds bilateral lower lobes Cardio: Irreg, irreg,  2-3 + edema Abdomen: Soft, + BS, obese,  Non tender, no guarding, rebound,  hernias, masses. Lymphatics: Non tender without lymphadenopathy.  Musculoskeletal: Full ROM, 5/5 strength, antalgic gait Skin: left nostril with 3x4 mm black scaly nodule on left nostril. Warm, dry without rashes, lesions, ecchymosis.  Neuro: Cranial nerves intact. No cerebellar symptoms.  Psych: Awake and oriented X 3,  Insight and Judgment appropriate.    Vicie Mutters, PA-C 5:05 PM [facility]

## 2016-09-21 ENCOUNTER — Encounter: Payer: Self-pay | Admitting: Physician Assistant

## 2016-09-21 ENCOUNTER — Ambulatory Visit (INDEPENDENT_AMBULATORY_CARE_PROVIDER_SITE_OTHER): Payer: BLUE CROSS/BLUE SHIELD | Admitting: Physician Assistant

## 2016-09-21 VITALS — BP 118/78 | HR 102 | Temp 97.5°F | Resp 18 | Ht 59.75 in | Wt 233.2 lb

## 2016-09-21 DIAGNOSIS — I159 Secondary hypertension, unspecified: Secondary | ICD-10-CM

## 2016-09-21 DIAGNOSIS — I5033 Acute on chronic diastolic (congestive) heart failure: Secondary | ICD-10-CM | POA: Diagnosis not present

## 2016-09-21 DIAGNOSIS — J449 Chronic obstructive pulmonary disease, unspecified: Secondary | ICD-10-CM | POA: Diagnosis not present

## 2016-09-21 DIAGNOSIS — Z79899 Other long term (current) drug therapy: Secondary | ICD-10-CM | POA: Diagnosis not present

## 2016-09-21 DIAGNOSIS — I2699 Other pulmonary embolism without acute cor pulmonale: Secondary | ICD-10-CM

## 2016-09-21 DIAGNOSIS — I2723 Pulmonary hypertension due to lung diseases and hypoxia: Secondary | ICD-10-CM

## 2016-09-21 DIAGNOSIS — I482 Chronic atrial fibrillation, unspecified: Secondary | ICD-10-CM

## 2016-09-21 DIAGNOSIS — F331 Major depressive disorder, recurrent, moderate: Secondary | ICD-10-CM

## 2016-09-21 DIAGNOSIS — E1122 Type 2 diabetes mellitus with diabetic chronic kidney disease: Secondary | ICD-10-CM

## 2016-09-21 DIAGNOSIS — G4733 Obstructive sleep apnea (adult) (pediatric): Secondary | ICD-10-CM | POA: Diagnosis not present

## 2016-09-21 DIAGNOSIS — N184 Chronic kidney disease, stage 4 (severe): Secondary | ICD-10-CM

## 2016-09-21 DIAGNOSIS — E785 Hyperlipidemia, unspecified: Secondary | ICD-10-CM

## 2016-09-21 LAB — CBC WITH DIFFERENTIAL/PLATELET
Basophils Absolute: 0 cells/uL (ref 0–200)
Basophils Relative: 0 %
Eosinophils Absolute: 0 cells/uL — ABNORMAL LOW (ref 15–500)
Eosinophils Relative: 0 %
HCT: 43.2 % (ref 35.0–45.0)
Hemoglobin: 14.3 g/dL (ref 11.7–15.5)
Lymphocytes Relative: 10 %
Lymphs Abs: 1210 cells/uL (ref 850–3900)
MCH: 29.4 pg (ref 27.0–33.0)
MCHC: 33.1 g/dL (ref 32.0–36.0)
MCV: 88.7 fL (ref 80.0–100.0)
MPV: 10.9 fL (ref 7.5–12.5)
Monocytes Absolute: 242 cells/uL (ref 200–950)
Monocytes Relative: 2 %
Neutro Abs: 10648 cells/uL — ABNORMAL HIGH (ref 1500–7800)
Neutrophils Relative %: 88 %
Platelets: 276 10*3/uL (ref 140–400)
RBC: 4.87 MIL/uL (ref 3.80–5.10)
RDW: 14.1 % (ref 11.0–15.0)
WBC: 12.1 10*3/uL — ABNORMAL HIGH (ref 3.8–10.8)

## 2016-09-21 MED ORDER — FLUTICASONE-UMECLIDIN-VILANT 100-62.5-25 MCG/INH IN AEPB
100.0000 ug | INHALATION_SPRAY | Freq: Every day | RESPIRATORY_TRACT | 6 refills | Status: DC
Start: 2016-09-21 — End: 2017-06-26

## 2016-09-21 NOTE — Patient Instructions (Addendum)
Stop soda/sweet tea/fruit juice Sprite zero is okay, diet ginger ale is good Drink water  Eat 3 meals a day, have to do breakfast, eat protein- hard boiled eggs, protein bar like nature valley protein bar, greek yogurt like oikos triple zero, chobani 100, or light n fit greek  Look into lidoderm patches from [facility]  Doing heating pad Can try baclofen as needed for back pain, try it at night around dinner first and see how you feel before you take it during the day incase it makes you tired.  Your ears and sinuses are connected by the eustachian tube. When your sinuses are inflamed, this can close off the tube and cause fluid to collect in your middle ear. This can then cause dizziness, popping, clicking, ringing, and echoing in your ears. This is often NOT an infection and does NOT require antibiotics, it is caused by inflammation so the treatments help the inflammation. This can take a long time to get better so please be patient.  Here are things you can do to help with this:  - Try the Flonase or Nasonex. Remember to spray each nostril twice towards the outer part of your eye.  Do not sniff but instead pinch your nose and tilt your head back to help the medicine get into your sinuses.  The best time to do this is at bedtime.Stop if you get blurred vision or nose bleeds.   -While drinking fluids, pinch and hold nose close and swallow, to help open eustachian tubes to drain fluid behind ear drums.  -Please pick one of the over the counter allergy medications below and take it once daily for allergies.  It will also help with fluid behind ear drums. Claritin or loratadine cheapest but likely the weakest  Zyrtec or certizine at night because it can make you sleepy The strongest is allegra or fexafinadine  Cheapest at [facility], [employer], [employer]  -can use decongestant over the counter, please do not use if you have high blood pressure or certain heart conditions.   if worsening HA, changes  vision/speech, imbalance, weakness go to the ER

## 2016-09-22 ENCOUNTER — Other Ambulatory Visit: Payer: Self-pay | Admitting: Physician Assistant

## 2016-09-22 LAB — HEPATIC FUNCTION PANEL
ALT: 59 U/L — ABNORMAL HIGH (ref 6–29)
AST: 26 U/L (ref 10–35)
Albumin: 4.1 g/dL (ref 3.6–5.1)
Alkaline Phosphatase: 85 U/L (ref 33–130)
Bilirubin, Direct: 0.1 mg/dL (ref <=0.2)
Indirect Bilirubin: 0.3 mg/dL (ref 0.2–1.2)
Total Bilirubin: 0.4 mg/dL (ref 0.2–1.2)
Total Protein: 6.8 g/dL (ref 6.1–8.1)

## 2016-09-22 LAB — LIPID PANEL
Cholesterol: 172 mg/dL (ref <200)
HDL: 61 mg/dL (ref >50)
LDL Cholesterol: 85 mg/dL (ref <100)
Total CHOL/HDL Ratio: 2.8 Ratio (ref <5.0)
Triglycerides: 132 mg/dL (ref <150)
VLDL: 26 mg/dL (ref <30)

## 2016-09-22 LAB — BASIC METABOLIC PANEL WITH GFR
BUN: 34 mg/dL — ABNORMAL HIGH (ref 7–25)
CO2: 25 mmol/L (ref 20–32)
Calcium: 9.4 mg/dL (ref 8.6–10.4)
Chloride: 99 mmol/L (ref 98–110)
Creat: 1.72 mg/dL — ABNORMAL HIGH (ref 0.50–1.05)
GFR, Est African American: 38 mL/min — ABNORMAL LOW (ref >=60)
GFR, Est Non African American: 33 mL/min — ABNORMAL LOW (ref >=60)
Glucose, Bld: 165 mg/dL — ABNORMAL HIGH (ref 65–99)
Potassium: 4.1 mmol/L (ref 3.5–5.3)
Sodium: 141 mmol/L (ref 135–146)

## 2016-09-22 LAB — TSH: TSH: 1.46 mIU/L

## 2016-09-22 LAB — MAGNESIUM: Magnesium: 1.9 mg/dL (ref 1.5–2.5)

## 2016-09-22 MED ORDER — LEVOFLOXACIN 500 MG PO TABS: 500.0000 mg | ORAL_TABLET | Freq: Every day | ORAL | 0 refills | Status: DC

## 2016-09-22 NOTE — Progress Notes (Signed)
Future Appointments Date Time Provider [place]  11/15/2016 3:15 PM Dennie Bible, NP [facility] None

## 2016-09-22 NOTE — Progress Notes (Signed)
LVM for pt to return office call for LAB results.

## 2016-09-28 NOTE — Progress Notes (Signed)
Pt aware of lab results & voiced understanding of those results.

## 2016-11-15 ENCOUNTER — Encounter: Payer: Self-pay | Admitting: Nurse Practitioner

## 2016-11-15 ENCOUNTER — Ambulatory Visit (INDEPENDENT_AMBULATORY_CARE_PROVIDER_SITE_OTHER): Payer: BLUE CROSS/BLUE SHIELD | Admitting: Nurse Practitioner

## 2016-11-15 DIAGNOSIS — Z9989 Dependence on other enabling machines and devices: Secondary | ICD-10-CM | POA: Diagnosis not present

## 2016-11-15 DIAGNOSIS — G4733 Obstructive sleep apnea (adult) (pediatric): Secondary | ICD-10-CM

## 2016-11-15 NOTE — Progress Notes (Signed)
[facility]  PATIENT: [name] DOB: [birth year]   REASON FOR VISIT: Follow-up for obstructive sleep apnea first CPAP compliance check HISTORY FROM: Patient    HISTORY OF PRESENT ILLNESS:UPDATE 10/08/2018CM [name], 56 year old female returns for follow-up. She has obstructive sleep apnea. Sleep study found patient has severe obstructive sleep apnea. This is her first CPAP compliance check. Data dated 10/16/2016- 11/14/2016 shows compliance greater than 4 hours for 23 days for 77%. Average usage 9 hours 33 minutes. Set pressure 12 cm. EPR 3. AHI 0.7. Minimal leak ESS 5 returns for reevaluation    08/04/16 CDDonna K Medina is a 56 y.o. female , seen here as in a referral from Dr. Idell Pickles PA Vicie Mutters.  This is my first encounter with [name] on 08/04/2016. The patient reports that she was diagnosed with sleep apnea at an AHI of 77, at the time at [address]. This sleep evaluation was initiated after her daughter had witnessed the patient having apnea during a hospitalization, she stayed at her bedside. The patient has an extensive and significant medical history I would like to introduce her past and current medical history as it makes the evaluation of sleep apnea urgent. She has chronic diastolic heart failure with water retention, shortness of breath, and orthopnea. She has chronic atrial fibrillation but is rate controlled, she has secondary hypertension she has chronic obstructive pulmonary disease and requires nebulizers and breathing treatments. She is considered super obese at a BMI of 50. CKD grade 4 with a GFR of 20.   [name] has presented with fatigue and weight gain and her primary care physician once urgently to put her back on CPAP. Her cardiologist is Dr. Britta Mccreedy long at the heart failure clinic, who is equally interested in getting her back on positive airway pressure especially since COPD  overlap is also present. I would like to add that the patient experienced pulmonary embolism during an attack of atrial flutter and that she has hypothyroidism as well, anemia and poorly controlled diabetes type 2. Her review of symptoms is positive for weight gain and fatigue palpitations leg swelling hearing loss and tinnitus constipation joint pain aching muscles, weakness, numbness, confusion, dizziness, sleepiness, decreased energy and is interested activities.   Sleep habits are as follows: The patient takes 150mg of trazodone to allow her to sleep, otherwise she states her mind is racing and she cannot find a way to sleep. This is around 8 PM when she takes the medication, bedtime is around 9:30 PM and the medication has started to work. She stays asleep for about 2-3 hours -her sleep is fragmented by bathroom breaks up to 4 at night. She looks much better in a chair or with her back of the bed elevated that in bed. And her sleep usually lasts longer and is less fragmented. But her legs swell more. She does report vivid dreams. She does not report palpitations, diaphoresis, nausea or dizziness at night. When she sleeps in bed she sleeps on her side, on multiple pillows for head support. Her alarm rings at 6:30 and she usually is ready to leave the house by 7.15 AM.     REVIEW OF SYSTEMS: Full 14 system review of systems performed and notable only for those listed, all others are neg:  Constitutional: neg  Cardiovascular: neg Ear/Nose/Throat: neg  Skin: neg Eyes: neg Respiratory: Shortness of breath Gastroitestinal: neg  Hematology/Lymphatic: neg  Endocrine: neg Musculoskeletal:neg Allergy/Immunology: neg Neurological:  neg Psychiatric: Anxiety Sleep : Obstructive sleep apnea with CPAP   ALLERGIES: Allergies  Allergen Reactions  . Sulfonamide Derivatives Hives and Itching  . Tikosyn [Dofetilide]     Prolonged qt    HOME MEDICATIONS: Outpatient Medications Prior to Visit    Medication Sig Dispense Refill  . albuterol (PROVENTIL) (2.5 MG/3ML) 0.083% nebulizer solution Take 3 mLs (2.5 mg total) by nebulization every 6 (six) hours as needed for wheezing or shortness of breath. 75 mL 12  . albuterol (VENTOLIN HFA) 108 (90 Base) MCG/ACT inhaler Inhale 2 puffs into the lungs every 4 (four) hours as needed for wheezing or shortness of breath. 1 Inhaler 0  . ALPRAZolam (XANAX) 0.5 MG tablet 1/2-1 pill PRN before procedure, if need to can take 1 at one time. Do not drive if taking. 30 tablet 0  . aspirin-acetaminophen-caffeine (EXCEDRIN MIGRAINE) 568-616-83 MG per tablet Take 1 tablet by mouth every 6 (six) hours as needed for headache.    Marland Kitchen atorvastatin (LIPITOR) 20 MG tablet Take 1 tablet (20 mg total) by mouth daily. 30 tablet 11  . bisoprolol (ZEBETA) 5 MG tablet Take 1 tablet (5 mg total) by mouth daily. 90 tablet 3  . citalopram (CELEXA) 40 MG tablet Take 1 tablet (40 mg total) by mouth daily. 30 tablet 2  . diltiazem (CARDIZEM CD) 360 MG 24 hr capsule Take 1 capsule (360 mg total) by mouth daily. 90 capsule 3  . Fluticasone-Umeclidin-Vilant (TRELEGY ELLIPTA) 100-62.5-25 MCG/INH AEPB Inhale 100 mcg into the lungs daily. 60 each 6  . furosemide (LASIX) 40 MG tablet Take 1 tablet (40 mg total) by mouth daily. Take an additional 40 mg tablet for weight gain/leg swelling 120 tablet 3  . metolazone (ZAROXOLYN) 2.5 MG tablet As needed for increase in swelling 90 tablet 3  . naproxen sodium (ANAPROX) 220 MG tablet Take 220 mg by mouth 2 (two) times daily as needed (back pain).    Marland Kitchen OVER THE COUNTER MEDICATION Take 100 mg by mouth at bedtime. Equate sleep medicine    . potassium chloride (K-DUR,KLOR-CON) 10 MEQ tablet Take 2 tablets (20 mEq total) by mouth 2 (two) times daily. Take additional 20 meq (2 tablets) if take additional furosemide 40 mg tablet 400 tablet 2  . rivaroxaban (XARELTO) 20 MG TABS tablet Take 1 tablet (20 mg total) by mouth daily with supper. 90 tablet 3  .  traZODone (DESYREL) 100 MG tablet TAKE 1/2-1 TABLET BY MOUTH FOR SLEEP 90 tablet 0  . OVER THE COUNTER MEDICATION as needed. Equate Night time sleep aid    . levofloxacin (LEVAQUIN) 500 MG tablet Take 1 tablet (500 mg total) by mouth daily. (Patient not taking: Reported on 11/15/2016) 10 tablet 0   Facility-Administered Medications Prior to Visit  Medication Dose Route Frequency Provider Last Rate Last Dose  . ipratropium-albuterol (DUONEB) 0.5-2.5 (3) MG/3ML nebulizer solution 3 mL  3 mL Nebulization Once Vicie Mutters, PA-C        PAST MEDICAL HISTORY: Past Medical History:  Diagnosis Date  . Anemia   . Anxiety   . Atrial flutter ([place])   . Borderline diabetes   . Chronic diastolic heart failure ([place])   . COPD (chronic obstructive pulmonary disease) ([place])   . Depression   . GERD (gastroesophageal reflux disease)   . HTN (hypertension)   . Hyperlipidemia   . Obstructive sleep apnea   . Persistent atrial fibrillation ([place])    Myoview 4/09: No ischemia;  echo 4/09: EF 60-65%;  Flecainide, beta blocker, Coumadin;    Unable to take Tikosyn 2/2 prolonged QT  . Pulmonary embolism (HCC)    Right lower lobe diagnosed by CT 6/12  . Type II or unspecified type diabetes mellitus without mention of complication, not stated as uncontrolled     PAST SURGICAL HISTORY: Past Surgical History:  Procedure Laterality Date  . basal skin can    . CARDIOVERSION  01/19/2012   Procedure: CARDIOVERSION;  Surgeon: Jolaine Artist, MD;  Location[facility] END[facility]COPY;  Service: Cardiovascular;  Laterality: N/A;  . CARDIOVERSION  01/21/2012   Procedure: CARDIOVERSION;  Surgeon: Carlena Bjornstad, MD;  Location: [place];  Service: Cardiovascular;  Laterality: N/A;  Rm [id number]  . TEE WITHOUT CARDIOVERSION  01/19/2012   Procedure: TRANSESOPHAGEAL ECHOCARDIOGRAM (TEE);  Surgeon: Jolaine Artist, MD;  Location: [facility] ENDOSCOPY;  Service: Cardiovascular;  Laterality: N/A;  . TUBAL LIGATION  1996    FAMILY  HISTORY: Family History  Problem Relation Age of Onset  . Colon cancer Mother   . Coronary artery disease Mother   . Coronary artery disease Father   . Asthma Father   . Emphysema Father   . Allergies Father   . Heart attack Maternal Grandfather   . Heart disease Maternal Grandmother   . Depression Daughter     SOCIAL HISTORY: Social History   Social History  . Marital status: Legally Separated    Spouse name: n/a  . Number of children: 4  . Years of education: 12   Occupational History  . clerical     [place] History Main Topics  . Smoking status: Former Smoker    Packs/day: 2.00    Years: 35.00    Types: Cigarettes    Quit date: 09/08/2009  . Smokeless tobacco: Never Used  . Alcohol use No  . Drug use: No  . Sexual activity: No   Other Topics Concern  . Not on file   Social History Narrative   Married but currently separated with 4 children, 2 of whom still live with her.  Youngest son is a Equities trader in [employer], to graduate in 07/2012.     PHYSICAL EXAM  Vitals:   11/15/16 1503  BP: 92/60  Pulse: 61  Weight: 239 lb 12.8 oz (108.8 kg)  Height: _0  (1.499 m)   Body mass index is 48.43 kg/m.  Generalized: Well developed,morbidly obese female in no acute distress  Head: normocephalic and atraumatic,. Oropharynx benign  Neck: Supple,  Musculoskeletal: No deformity   Neurological examination   Mentation: Alert oriented to time, place, history taking. Attention span and concentration appropriate. Recent and remote memory intact.  Follows all commands speech and language fluent.   Cranial nerve II-XII: Pupils were equal round reactive to light extraocular movements were full, visual field were full on confrontational test. Facial sensation and strength were normal. hearing was intact to finger rubbing bilaterally. Uvula tongue midline. head turning and shoulder shrug were normal and symmetric.Tongue protrusion into cheek strength was  normal. Motor: normal bulk and tone, full strength in the BUE, BLE, fine finger movements normal, no pronator drift. No focal weakness Sensory: normal and symmetric to light touch, in the upper and lower extremities  Coordination: finger-nose-finger, heel-to-shin bilaterally, no dysmetria Reflexes: symmetric upper and lower, plantar responses were flexor bilaterally. Gait and Station: Rising up from seated position without assistance, normal stance,  moderate stride, good arm swing, smooth turning, able to perform tiptoe, and heel walking without difficulty. Tandem  gait is steady. No assistive device  DIAGNOSTIC DATA (LABS, IMAGING, TESTING) - I reviewed patient records, labs, notes, testing and imaging myself where available.  Lab Results  Component Value Date   WBC 12.1 (H) 09/21/2016   HGB 14.3 09/21/2016   HCT 43.2 09/21/2016   MCV 88.7 09/21/2016   PLT 276 09/21/2016      Component Value Date/Time   NA 141 09/21/2016 1728   K 4.1 09/21/2016 1728   CL 99 09/21/2016 1728   CO2 25 09/21/2016 1728   GLUCOSE 165 (H) 09/21/2016 1728   BUN 34 (H) 09/21/2016 1728   CREATININE 1.72 (H) 09/21/2016 1728   CALCIUM 9.4 09/21/2016 1728   PROT 6.8 09/21/2016 1728   ALBUMIN 4.1 09/21/2016 1728   AST 26 09/21/2016 1728   ALT 59 (H) 09/21/2016 1728   ALKPHOS 85 09/21/2016 1728   BILITOT 0.4 09/21/2016 1728   GFRNONAA 33 (L) 09/21/2016 1728   GFRAA 38 (L) 09/21/2016 1728   Lab Results  Component Value Date   CHOL 172 09/21/2016   HDL 61 09/21/2016   LDLCALC 85 09/21/2016   TRIG 132 09/21/2016   CHOLHDL 2.8 09/21/2016   Lab Results  Component Value Date   HGBA1C 5.8 (H) 06/09/2016   Lab Results  Component Value Date   VITAMINB12 501 06/09/2016   Lab Results  Component Value Date   TSH 1.46 09/21/2016      ASSESSMENT AND PLAN  56 y.o. year old female  has a past medical history of Anemia; Anxiety; Atrial flutter ([place]); Borderline diabetes; Chronic diastolic heart failure  (HCC); COPD (chronic obstructive pulmonary disease) ([place]); Depression; GERD (gastroesophageal reflux disease); HTN (hypertension); Hyperlipidemia; and severe Obstructive sleep apnea; Persistent atrial fibrillation ([place]);  here to follow-up CPAP compliance .Data dated 10/16/2016- 11/14/2016 shows compliance greater than 4 hours for 23 days for 77%. Average usage 9 hours 33 minutes. Set pressure 12 cm. EPR 3. AHI 0.7. Minimal leak ESS 5   CPAP compliance is 77% review compliance report with patient Continue same settings Follow-up in 6 months I spent 86mn  in total face to face time with the patient more than 50% of which was spent counseling and coordination of care, reviewing test results reviewing medications and discussing and reviewing the diagnosis of obstructive sleep apnea reviewing the compliance report NDennie Bible [facility] [employer] APRN  [facility] [address] [place] [phone]

## 2016-11-15 NOTE — Patient Instructions (Signed)
CPAP compliance is 77% Continue same settings Follow-up in 6 months

## 2016-12-06 ENCOUNTER — Other Ambulatory Visit: Payer: Self-pay | Admitting: Internal Medicine

## 2016-12-06 DIAGNOSIS — F329 Major depressive disorder, single episode, unspecified: Secondary | ICD-10-CM

## 2016-12-06 DIAGNOSIS — F32A Depression, unspecified: Secondary | ICD-10-CM

## 2016-12-14 DIAGNOSIS — G4733 Obstructive sleep apnea (adult) (pediatric): Secondary | ICD-10-CM | POA: Diagnosis not present

## 2017-01-04 ENCOUNTER — Other Ambulatory Visit: Payer: Self-pay | Admitting: Physician Assistant

## 2017-01-05 ENCOUNTER — Encounter: Payer: Self-pay | Admitting: Physician Assistant

## 2017-01-05 ENCOUNTER — Ambulatory Visit: Payer: BLUE CROSS/BLUE SHIELD | Admitting: Physician Assistant

## 2017-01-05 VITALS — BP 110/68 | HR 78 | Temp 97.3°F | Resp 14 | Ht 59.0 in | Wt 240.0 lb

## 2017-01-05 DIAGNOSIS — Z79899 Other long term (current) drug therapy: Secondary | ICD-10-CM | POA: Diagnosis not present

## 2017-01-05 DIAGNOSIS — I159 Secondary hypertension, unspecified: Secondary | ICD-10-CM

## 2017-01-05 DIAGNOSIS — G4733 Obstructive sleep apnea (adult) (pediatric): Secondary | ICD-10-CM | POA: Diagnosis not present

## 2017-01-05 DIAGNOSIS — I482 Chronic atrial fibrillation, unspecified: Secondary | ICD-10-CM

## 2017-01-05 DIAGNOSIS — J449 Chronic obstructive pulmonary disease, unspecified: Secondary | ICD-10-CM

## 2017-01-05 DIAGNOSIS — I5033 Acute on chronic diastolic (congestive) heart failure: Secondary | ICD-10-CM | POA: Diagnosis not present

## 2017-01-05 DIAGNOSIS — I2723 Pulmonary hypertension due to lung diseases and hypoxia: Secondary | ICD-10-CM | POA: Diagnosis not present

## 2017-01-05 DIAGNOSIS — R7303 Prediabetes: Secondary | ICD-10-CM | POA: Diagnosis not present

## 2017-01-05 DIAGNOSIS — E785 Hyperlipidemia, unspecified: Secondary | ICD-10-CM

## 2017-01-05 DIAGNOSIS — I5032 Chronic diastolic (congestive) heart failure: Secondary | ICD-10-CM | POA: Diagnosis not present

## 2017-01-05 DIAGNOSIS — F331 Major depressive disorder, recurrent, moderate: Secondary | ICD-10-CM | POA: Diagnosis not present

## 2017-01-05 DIAGNOSIS — N184 Chronic kidney disease, stage 4 (severe): Secondary | ICD-10-CM

## 2017-01-05 DIAGNOSIS — E1122 Type 2 diabetes mellitus with diabetic chronic kidney disease: Secondary | ICD-10-CM

## 2017-01-05 MED ORDER — TERBINAFINE HCL 250 MG PO TABS: 250.0000 mg | ORAL_TABLET | Freq: Every day | ORAL | 2 refills | Status: DC

## 2017-01-05 MED ORDER — NYSTATIN 100000 UNIT/GM EX CREA: 1.0000 "application " | TOPICAL_CREAM | Freq: Two times a day (BID) | CUTANEOUS | 1 refills | Status: DC

## 2017-01-05 MED ORDER — TRIAMCINOLONE ACETONIDE 0.5 % EX CREA: 1.0000 "application " | TOPICAL_CREAM | Freq: Two times a day (BID) | CUTANEOUS | 2 refills | Status: DC

## 2017-01-05 NOTE — Progress Notes (Signed)
Assessment and Plan:   Chronic diastolic heart failure ([place]) Continue follow up cardio, weight down, continue weight loss Swelling better with CPAP Will suggest compression socks  Chronic atrial fibrillation ([place]) Rate controlled, continue medications, xarelto Has follow up with cardiology coming up ? Can do patient assistance will try for patient  Secondary hypertension - continue medications, DASH diet, exercise and monitor at home. Call if greater than 130/80.  -     CBC with Differential/Platelet -     BASIC METABOLIC PANEL WITH GFR -     Hepatic function panel -     TSH  Chronic obstructive pulmonary disease, unspecified COPD type ([place]) Continue weight loss, BERO, continue CPAP  Hyperlipidemia, unspecified hyperlipidemia type -check lipids, decrease fatty foods, increase activity.  -     Lipid panel  Morbid obesity (BMI 49.6) - long discussion about weight loss, diet, and exercise NO SODA  OSA (obstructive sleep apnea) with COPD overlap syndrome On CPAP x 1 month and doing better, less leg swelling  Depression, unspecified depression type - continue medications, stress management techniques discussed, increase water, good sleep hygiene discussed, increase exercise, and increase veggies.  - GET BACK ON celexa  Tinea pedis Severe, cream and lamsil and if not better will refer to podiatry  Medication management -     Magnesium    Patient walked out without getting labs, will contact and bring back for lab only.  Continue diet and meds as discussed. Discussed med's effects and SE's.  Future Appointments  Date Time Provider [place]  05/18/2017  3:45 PM Dennie Bible, NP [facility] None    HPI 56 y.o. female  presents for 3 month follow up with hypertension, hyperlipidemia, diabetes and vitamin D. Her blood pressure has been controlled at home, today their BP is BP: 110/68  She does not workout. She denies chest pain, shortness of breath, dizziness.   She has a history of afib and CHF, she is on amiodarone, cardizem, she is on xarleto, following with Dr. Haroldine Laws. She is on lasix 2-3 a day and zaroxyln once a week and 6 potassium a day.  She had AKI with CKD but kidney function had improved last OV, still advised to see nephrology.  Lab Results  Component Value Date   GFRNONAA 33 (L) 09/21/2016   She also has PHTN due to COPD and OSA, she is on CPAP, she is on breo once a day and albuterol only as needed. She is not on cholesterol medication and denies myalgias. Her cholesterol is not at goal. The cholesterol was: Lab Results  Component Value Date   CHOL 172 09/21/2016   HDL 61 09/21/2016   LDLCALC 85 09/21/2016   TRIG 132 09/21/2016   CHOLHDL 2.8 09/21/2016     She has been working on diet and exercise for Diabetes, and denies polydipsia, polyuria and visual disturbances. Last A1C  was:  Lab Results  Component Value Date   HGBA1C 5.8 (H) 06/09/2016   Patient is on Vitamin D supplement. She is on thyroid medication. Her medication was not changed last visit.  Lab Results  Component Value Date   TSH 1.46 09/21/2016  .  BMI is Body mass index is 48.47 kg/m.,  She has depression was started on celexa last OV that she states is helping.  Wt Readings from Last 3 Encounters:  01/05/17 240 lb (108.9 kg)  11/15/16 239 lb 12.8 oz (108.8 kg)  09/21/16 233 lb 3.2 oz (105.8 kg)  Current Medications:  Current Outpatient Medications on File Prior to Visit  Medication Sig  . albuterol (PROVENTIL) (2.5 MG/3ML) 0.083% nebulizer solution Take 3 mLs (2.5 mg total) by nebulization every 6 (six) hours as needed for wheezing or shortness of breath.  Marland Kitchen albuterol (VENTOLIN HFA) 108 (90 Base) MCG/ACT inhaler Inhale 2 puffs into the lungs every 4 (four) hours as needed for wheezing or shortness of breath.  . ALPRAZolam (XANAX) 0.5 MG tablet 1/2-1 pill PRN before procedure, if need to can take 1 at one time. Do not drive if taking.  Marland Kitchen  aspirin-acetaminophen-caffeine (EXCEDRIN MIGRAINE) 250-250-65 MG per tablet Take 1 tablet by mouth every 6 (six) hours as needed for headache.  Marland Kitchen atorvastatin (LIPITOR) 20 MG tablet Take 1 tablet (20 mg total) by mouth daily.  . bisoprolol (ZEBETA) 5 MG tablet Take 1 tablet (5 mg total) by mouth daily.  . citalopram (CELEXA) 40 MG tablet TAKE 1 TABLET BY MOUTH EVERY DAY  . diltiazem (CARDIZEM CD) 360 MG 24 hr capsule Take 1 capsule (360 mg total) by mouth daily.  . Fluticasone-Umeclidin-Vilant (TRELEGY ELLIPTA) 100-62.5-25 MCG/INH AEPB Inhale 100 mcg into the lungs daily.  . furosemide (LASIX) 40 MG tablet Take 1 tablet (40 mg total) by mouth daily. Take an additional 40 mg tablet for weight gain/leg swelling  . levofloxacin (LEVAQUIN) 500 MG tablet Take 1 tablet (500 mg total) by mouth daily.  . metolazone (ZAROXOLYN) 2.5 MG tablet As needed for increase in swelling  . naproxen sodium (ANAPROX) 220 MG tablet Take 220 mg by mouth 2 (two) times daily as needed (back pain).  Marland Kitchen OVER THE COUNTER MEDICATION Take 100 mg by mouth at bedtime. Equate sleep medicine  . potassium chloride (K-DUR,KLOR-CON) 10 MEQ tablet Take 2 tablets (20 mEq total) by mouth 2 (two) times daily. Take additional 20 meq (2 tablets) if take additional furosemide 40 mg tablet  . rivaroxaban (XARELTO) 20 MG TABS tablet Take 1 tablet (20 mg total) by mouth daily with supper.  . traZODone (DESYREL) 100 MG tablet TAKE 1/2-1 TABLET BY MOUTH FOR SLEEP   Current Facility-Administered Medications on File Prior to Visit  Medication  . ipratropium-albuterol (DUONEB) 0.5-2.5 (3) MG/3ML nebulizer solution 3 mL   Medical History:  Past Medical History:  Diagnosis Date  . Anemia   . Anxiety   . Atrial flutter ([place])   . Borderline diabetes   . Chronic diastolic heart failure ([place])   . COPD (chronic obstructive pulmonary disease) ([place])   . Depression   . GERD (gastroesophageal reflux disease)   . HTN (hypertension)   . Hyperlipidemia    . Obstructive sleep apnea   . Persistent atrial fibrillation ([place])    Myoview 4/09: No ischemia;  echo 4/09: EF 60-65%;   Flecainide, beta blocker, Coumadin;    Unable to take Tikosyn 2/2 prolonged QT  . Pulmonary embolism ([facility])    Right lower lobe diagnosed by CT 6/12  . Type II or unspecified type diabetes mellitus without mention of complication, not stated as uncontrolled    Allergies:  Allergies  Allergen Reactions  . Sulfonamide Derivatives Hives and Itching  . Tikosyn [Dofetilide]     Prolonged qt    Review of Systems:  See HPI  Family history- Review and unchanged Social history- Review and unchanged Physical Exam: BP 110/68   Pulse 78   Temp (!) 97.3 F (36.3 C)   Resp 14   Ht _0  (1.499 m)   Wt 240 lb (108.9  kg)   SpO2 94%   BMI 48.47 kg/m  Wt Readings from Last 3 Encounters:  01/05/17 240 lb (108.9 kg)  11/15/16 239 lb 12.8 oz (108.8 kg)  09/21/16 233 lb 3.2 oz (105.8 kg)   General Appearance: Well nourished, obese, in no apparent distress. Eyes: PERRLA, EOMs, conjunctiva no swelling or erythema Sinuses: No Frontal/maxillary tenderness ENT/Mouth: Ext aud canals clear, TMs without erythema, bulging. No erythema, swelling, or exudate on post pharynx.Tonsils not swollen or erythematous. Hearing normal. Crowded mouth Neck: Supple, thyroid normal.  Respiratory: Decreased breath sounds bilateral, with wheezing and decreased breath sounds bilateral lower lobes Cardio: Irreg, irreg,  minimal edema Abdomen: Soft, + BS, obese,  Non tender, no guarding, rebound, hernias, masses. Lymphatics: Non tender without lymphadenopathy.  Musculoskeletal: Full ROM, 5/5 strength, antalgic gait Skin: well healing scar on left nostril, bilateral feet with white fungus, some thickening at toes, no ulcers. Warm, dry without rashes, lesions, ecchymosis.  Neuro: Cranial nerves intact. No cerebellar symptoms.  Psych: Awake and oriented X 3,  Insight and Judgment appropriate.     Vicie Mutters, PA-C 5:03 PM [facility]

## 2017-01-05 NOTE — Patient Instructions (Addendum)
VENOUS INSUFFICIENCY Our lower leg venous system is not the most reliable, the heart does NOT pump fluid up, there is a valve system.  The muscles of the leg squeeze and the blood moves up and a valve opens and close, then they squeeze, blood moves up and valves open and closes keeping the blood moving towards the heart.  Lots can go wrong with this valve system.  If someone is sitting or standing without movement, everyone will get swelling.  THINGS TO DO:  Do not stand or sit in one position for long periods of time. Do not sit with your legs crossed. Rest with your legs raised during the day.  Your legs have to be higher than your heart so that gravity will force the valves to open, so please really elevate your legs.   Wear elastic stockings or support hose. Do not wear other tight, encircling garments around the legs, pelvis, or waist.  [employer]  has a wide variety of well priced compression stockings. [place], [place] [address] [address] [place] has a good cheap selection, I like the socks, they are not as hard to get on  Walk as much as possible to increase blood flow.  Raise the foot of your bed at night with 2-inch blocks.  SEEK MEDICAL CARE IF:   The skin around your ankle starts to break down.  You have pain, redness, tenderness, or hard swelling developing in your leg over a vein.  You are uncomfortable due to leg pain.  If you ever have shortness of breath with exertion or chest pain go to the ER.   STOP THE SODA DRINK WATER!!!!!  You are dehydrated, please increase your fluids, this is causing your kidney function to be decreased. Being dehydrated can also cause fatigue, headaches, muscle aches, joint pain, and dry skin/nails so please increase your fluids.      Bad carbs also include fruit juice, alcohol, and sweet tea. These are empty calories that do not signal to your brain that you are full.   Please remember the good carbs  are still carbs which convert into sugar. So please measure them out no more than 1/2-1 cup of rice, oatmeal, pasta, and beans  Veggies are however free foods! Pile them on.   Not all fruit is created equal. Please see the list below, the fruit at the bottom is higher in sugars than the fruit at the top. Please avoid all dried fruits.    Athlete's Foot Athlete's foot (tinea pedis) is a fungal infection of the skin on the feet. It often occurs on the skin that is between or underneath the toes. It can also occur on the soles of the feet. The infection can spread from person to person (is contagious). What are the causes? Athlete's foot is caused by a fungus. This fungus grows in warm, moist places. Most people get athlete's foot by sharing shower stalls, towels, and wet floors with someone who is infected. Not washing your feet or changing your socks often enough can contribute to athlete's foot. What increases the risk? This condition is more likely to develop in:  Men.  People who have a weak body defense system (immune system).  People who have diabetes.  People who use public showers, such as at a gym.  People who wear heavy-duty shoes, such as Environmental manager.  Seasons with warm, humid weather.  What are the signs or symptoms? Symptoms of  this condition include:  Itchy areas between the toes or on the soles of the feet.  White, flaky, or scaly areas between the toes or on the soles of the feet.  Very itchy small blisters between the toes or on the soles of the feet.  Small cuts on the skin. These cuts can become infected.  Thick or discolored toenails.  How is this diagnosed? This condition is diagnosed with a medical history and physical exam. Your health care provider may also take a skin or toenail sample to be examined. How is this treated? Treatment for this condition includes antifungal medicines. These may be applied as powders, ointments, or creams.  In severe cases, an oral antifungal medicine may be given. Follow these instructions at home:  Apply or take over-the-counter and prescription medicines only as told by your health care provider.  Keep all follow-up visits as told by your health care provider. This is important.  Do not scratch your feet.  Keep your feet dry: ? Wear cotton or wool socks. Change your socks every day or if they become wet. ? Wear shoes that allow air to circulate, such as sandals or canvas tennis shoes.  Wash and dry your feet: ? Every day or as told by your health care provider. ? After exercising. ? Including the area between your toes.  Do not share towels, nail clippers, or other personal items that touch your feet with others.  If you have diabetes, keep your blood sugar under control. How is this prevented?  Do not share towels.  Wear sandals in wet areas, such as locker rooms and shared showers.  Keep your feet dry: ? Wear cotton or wool socks. Change your socks every day or if they become wet. ? Wear shoes that allow air to circulate, such as sandals or canvas tennis shoes.  Wash and dry your feet after exercising. Pay attention to the area between your toes. Contact a health care provider if:  You have a fever.  You have swelling, soreness, warmth, or redness in your foot.  You are not getting better with treatment.  Your symptoms get worse.  You have new symptoms. This information is not intended to replace advice given to you by your health care provider. Make sure you discuss any questions you have with your health care provider. Document Released: 01/23/2000 Document Revised: 07/03/2015 Document Reviewed: 07/29/2014 Elsevier Interactive Patient Education  2018 [employer].

## 2017-01-06 ENCOUNTER — Other Ambulatory Visit: Payer: Self-pay | Admitting: Physician Assistant

## 2017-01-06 DIAGNOSIS — Z79899 Other long term (current) drug therapy: Secondary | ICD-10-CM

## 2017-01-06 LAB — HEPATIC FUNCTION PANEL
AG Ratio: 1.3 (calc) (ref 1.0–2.5)
ALT: 20 U/L (ref 6–29)
AST: 18 U/L (ref 10–35)
Albumin: 4.2 g/dL (ref 3.6–5.1)
Alkaline phosphatase (APISO): 107 U/L (ref 33–130)
Bilirubin, Direct: 0.1 mg/dL (ref 0.0–0.2)
Globulin: 3.3 g/dL (calc) (ref 1.9–3.7)
Indirect Bilirubin: 0.5 mg/dL (calc) (ref 0.2–1.2)
Total Bilirubin: 0.6 mg/dL (ref 0.2–1.2)
Total Protein: 7.5 g/dL (ref 6.1–8.1)

## 2017-01-06 LAB — CBC WITH DIFFERENTIAL/PLATELET
Basophils Absolute: 27 cells/uL (ref 0–200)
Basophils Relative: 0.3 %
Eosinophils Absolute: 89 cells/uL (ref 15–500)
Eosinophils Relative: 1 %
HCT: 42.6 % (ref 35.0–45.0)
Hemoglobin: 14.1 g/dL (ref 11.7–15.5)
Lymphs Abs: 2225 cells/uL (ref 850–3900)
MCH: 27.9 pg (ref 27.0–33.0)
MCHC: 33.1 g/dL (ref 32.0–36.0)
MCV: 84.4 fL (ref 80.0–100.0)
MPV: 11.7 fL (ref 7.5–12.5)
Monocytes Relative: 7.3 %
Neutro Abs: 5910 cells/uL (ref 1500–7800)
Neutrophils Relative %: 66.4 %
Platelets: 272 10*3/uL (ref 140–400)
RBC: 5.05 10*6/uL (ref 3.80–5.10)
RDW: 13.4 % (ref 11.0–15.0)
Total Lymphocyte: 25 %
WBC mixed population: 650 cells/uL (ref 200–950)
WBC: 8.9 10*3/uL (ref 3.8–10.8)

## 2017-01-06 LAB — BASIC METABOLIC PANEL WITH GFR
BUN/Creatinine Ratio: 16 (calc) (ref 6–22)
BUN: 33 mg/dL — ABNORMAL HIGH (ref 7–25)
CO2: 35 mmol/L — ABNORMAL HIGH (ref 20–32)
Calcium: 10.2 mg/dL (ref 8.6–10.4)
Chloride: 93 mmol/L — ABNORMAL LOW (ref 98–110)
Creat: 2.12 mg/dL — ABNORMAL HIGH (ref 0.50–1.05)
GFR, Est African American: 29 mL/min/{1.73_m2} — ABNORMAL LOW (ref >=60)
GFR, Est Non African American: 25 mL/min/{1.73_m2} — ABNORMAL LOW (ref >=60)
Glucose, Bld: 104 mg/dL — ABNORMAL HIGH (ref 65–99)
Potassium: 3.1 mmol/L — ABNORMAL LOW (ref 3.5–5.3)
Sodium: 143 mmol/L (ref 135–146)

## 2017-01-06 LAB — HEMOGLOBIN A1C
Hgb A1c MFr Bld: 6.4 % of total Hgb — ABNORMAL HIGH (ref <5.7)
Mean Plasma Glucose: 137 (calc)
eAG (mmol/L): 7.6 (calc)

## 2017-01-06 LAB — TSH: TSH: 3.83 mIU/L (ref 0.40–4.50)

## 2017-01-06 LAB — LIPID PANEL
Cholesterol: 172 mg/dL (ref <200)
HDL: 52 mg/dL (ref >50)
LDL Cholesterol (Calc): 92 mg/dL (calc)
Non-HDL Cholesterol (Calc): 120 mg/dL (calc) (ref <130)
Total CHOL/HDL Ratio: 3.3 (calc) (ref <5.0)
Triglycerides: 181 mg/dL — ABNORMAL HIGH (ref <150)

## 2017-01-06 LAB — MAGNESIUM: Magnesium: 2 mg/dL (ref 1.5–2.5)

## 2017-01-13 DIAGNOSIS — G4733 Obstructive sleep apnea (adult) (pediatric): Secondary | ICD-10-CM | POA: Diagnosis not present

## 2017-02-09 ENCOUNTER — Other Ambulatory Visit: Payer: BLUE CROSS/BLUE SHIELD

## 2017-02-09 DIAGNOSIS — E782 Mixed hyperlipidemia: Secondary | ICD-10-CM | POA: Diagnosis not present

## 2017-02-09 DIAGNOSIS — Z79899 Other long term (current) drug therapy: Secondary | ICD-10-CM

## 2017-02-10 LAB — BASIC METABOLIC PANEL WITH GFR
BUN/Creatinine Ratio: 14 (calc) (ref 6–22)
BUN: 21 mg/dL (ref 7–25)
CO2: 31 mmol/L (ref 20–32)
Calcium: 9.1 mg/dL (ref 8.6–10.4)
Chloride: 103 mmol/L (ref 98–110)
Creat: 1.52 mg/dL — ABNORMAL HIGH (ref 0.50–1.05)
GFR, Est African American: 43 mL/min/{1.73_m2} — ABNORMAL LOW (ref >=60)
GFR, Est Non African American: 37 mL/min/{1.73_m2} — ABNORMAL LOW (ref >=60)
Glucose, Bld: 96 mg/dL (ref 65–99)
Potassium: 3.5 mmol/L (ref 3.5–5.3)
Sodium: 143 mmol/L (ref 135–146)

## 2017-02-10 LAB — MAGNESIUM: Magnesium: 2.1 mg/dL (ref 1.5–2.5)

## 2017-02-13 DIAGNOSIS — G4733 Obstructive sleep apnea (adult) (pediatric): Secondary | ICD-10-CM | POA: Diagnosis not present

## 2017-03-04 ENCOUNTER — Other Ambulatory Visit (HOSPITAL_COMMUNITY): Payer: Self-pay | Admitting: Cardiology

## 2017-03-04 ENCOUNTER — Other Ambulatory Visit: Payer: Self-pay | Admitting: Physician Assistant

## 2017-03-05 ENCOUNTER — Other Ambulatory Visit: Payer: Self-pay | Admitting: Physician Assistant

## 2017-03-05 DIAGNOSIS — F329 Major depressive disorder, single episode, unspecified: Secondary | ICD-10-CM

## 2017-03-05 DIAGNOSIS — F32A Depression, unspecified: Secondary | ICD-10-CM

## 2017-03-16 DIAGNOSIS — G4733 Obstructive sleep apnea (adult) (pediatric): Secondary | ICD-10-CM | POA: Diagnosis not present

## 2017-03-18 ENCOUNTER — Other Ambulatory Visit (HOSPITAL_COMMUNITY): Payer: Self-pay | Admitting: Cardiology

## 2017-03-18 DIAGNOSIS — I5032 Chronic diastolic (congestive) heart failure: Secondary | ICD-10-CM

## 2017-03-31 ENCOUNTER — Telehealth (HOSPITAL_COMMUNITY): Payer: Self-pay | Admitting: *Deleted

## 2017-03-31 MED ORDER — METOPROLOL TARTRATE 50 MG PO TABS: 50.0000 mg | ORAL_TABLET | Freq: Two times a day (BID) | ORAL | 3 refills | Status: DC

## 2017-03-31 NOTE — Telephone Encounter (Signed)
Called patient back and told her to start Lopressor 50 mg Twice Daily and to call us back when pharmacy has bisoprolol so that we can switch her back. Medication sent to pharmacy.

## 2017-03-31 NOTE — Telephone Encounter (Signed)
-----  Message from Shirley Friar, PA-C sent at 03/31/2017 12:48 PM EST ----- Please see below. Lopressor equivalent is 50 mg BID.  OK to switch back to bisoprolol once it is available again.   ----- Message ----- From: Adora Fridge, RPH-CPP Sent: 03/31/2017  12:27 PM To: Shirley Friar, PA-C  Yea that works. Looks like it's more for afib rate control. Equivalent bisoprolol 5 mg daily dose would be metoprolol tartrate 50 mg BID.   ----- Message ----- From: Shirley Friar, PA-C Sent: 03/31/2017  11:11 AM To: Darron Doom, RN, Adora Fridge, RPH-CPP   Pt calling as bisoprolol on back order, wanting to switch  She is diastolic CHF with Afib.  Would we do Lopressor?   Legrand Como [address] [place], PA-C 03/31/2017 11:12 AM

## 2017-04-13 DIAGNOSIS — G4733 Obstructive sleep apnea (adult) (pediatric): Secondary | ICD-10-CM | POA: Diagnosis not present

## 2017-05-02 NOTE — Progress Notes (Signed)
Assessment and Plan:   Chronic diastolic heart failure ([place]) Continue follow up cardio Monitor weight daily Take the zaroxylyn and go up to the lasix 2 a day at the same time Weight is up withsome SOB Will suggest compression socks  Chronic atrial fibrillation ([place]) Rate controlled, continue medications, xarelto Has follow up with cardiology coming up  Secondary hypertension - continue medications, DASH diet, exercise and monitor at home. Call if greater than 130/80.  Need to switch back to ziac, metoprolol may be making breathing worse- needs selective BB -     CBC with Differential/Platelet -     BASIC METABOLIC PANEL WITH GFR -     Hepatic function panel -     TSH  Chronic obstructive pulmonary disease, unspecified COPD type ([facility]) Continue weight loss, TRELOGY samples given, do nebulizer when she gets home, continue CPAP  Hyperlipidemia, unspecified hyperlipidemia type -check lipids, decrease fatty foods, increase activity.  -     Lipid panel  Morbid obesity (BMI 49.6) - long discussion about weight loss, diet, and exercise NO SODA  OSA (obstructive sleep apnea) with COPD overlap syndrome On CPAP and doing better  Depression, unspecified depression type - continue medications, stress management techniques discussed, increase water, good sleep hygiene discussed, increase exercise, and increase veggies.  - GET BACK ON celexa every day  Medication management -     Magnesium   The patient was advised to call immediately if she has any concerning symptoms in the interval. The patient voices understanding of current treatment options and is in agreement with the current care plan.The patient knows to call the clinic with any problems, questions or concerns or go to the ER if any further progression of symptoms.   Patient walked out without getting labs, will contact and bring back for lab only.  Continue diet and meds as discussed. Discussed med's effects and SE's.  Future  Appointments  Date Time Provider [place]  05/18/2017  3:45 PM Dennie Bible, NP [facility] None  05/19/2017  2:40 PM Bensimhon, Shaune Pascal, MD [facility] None    HPI 57 y.o. female  presents for 3 month follow up with hypertension, hyperlipidemia, diabetes and vitamin D. Her blood pressure has been controlled at home, today their BP is BP: 100/66  She does not workout. She denies chest pain, shortness of breath, dizziness.  She has a history of afib and CHF, she is on amiodarone, cardizem, she is on xarleto, following with Dr. Haroldine Laws. She is on lasix 2 a day and zaroxyln once a week and 6 potassium a day. Her weight is up, she has some SOB. Wt Readings from Last 3 Encounters:  05/03/17 245 lb 9.6 oz (111.4 kg)  01/05/17 240 lb (108.9 kg)  11/15/16 239 lb 12.8 oz (108.8 kg)   She had AKI with CKD but kidney function had improved last OV, still advised to see nephrology.  Lab Results  Component Value Date   UJWJXBJY 78 (L) 02/09/2017   She also has PHTN due to COPD and OSA, she is on CPAP, she has ran out of her inhaler- having shortness of breath and albuterol only as needed. She is not on cholesterol medication and denies myalgias. Her cholesterol is not at goal. The cholesterol was: Lab Results  Component Value Date   CHOL 172 01/05/2017   HDL 52 01/05/2017   LDLCALC 92 01/05/2017   TRIG 181 (H) 01/05/2017   CHOLHDL 3.3 01/05/2017     She has been working  on diet and exercise for Diabetes, and denies polydipsia, polyuria and visual disturbances. Last A1C  was:  Lab Results  Component Value Date   HGBA1C 6.4 (H) 01/05/2017   Patient is on Vitamin D supplement. She is on thyroid medication. Her medication was not changed last visit.  Lab Results  Component Value Date   TSH 3.83 01/05/2017  .  BMI is Body mass index is 49.61 kg/m.,  She has depression was started on celexa last OV that she states is helping.  Wt Readings from Last 3 Encounters:  05/03/17 245 lb  9.6 oz (111.4 kg)  01/05/17 240 lb (108.9 kg)  11/15/16 239 lb 12.8 oz (108.8 kg)     Current Medications:  Current Outpatient Medications on File Prior to Visit  Medication Sig  . albuterol (PROVENTIL) (2.5 MG/3ML) 0.083% nebulizer solution Take 3 mLs (2.5 mg total) by nebulization every 6 (six) hours as needed for wheezing or shortness of breath.  Marland Kitchen albuterol (VENTOLIN HFA) 108 (90 Base) MCG/ACT inhaler Inhale 2 puffs into the lungs every 4 (four) hours as needed for wheezing or shortness of breath.  . ALPRAZolam (XANAX) 0.5 MG tablet 1/2-1 pill PRN before procedure, if need to can take 1 at one time. Do not drive if taking.  Marland Kitchen aspirin-acetaminophen-caffeine (EXCEDRIN MIGRAINE) 250-250-65 MG per tablet Take 1 tablet by mouth every 6 (six) hours as needed for headache.  Marland Kitchen atorvastatin (LIPITOR) 20 MG tablet Take 1 tablet (20 mg total) by mouth daily.  . citalopram (CELEXA) 40 MG tablet TAKE 1 TABLET BY MOUTH EVERY DAY  . diltiazem (CARDIZEM CD) 360 MG 24 hr capsule Take 1 capsule (360 mg total) by mouth daily. Needs office visit for further refills [phone]  . Fluticasone-Umeclidin-Vilant (TRELEGY ELLIPTA) 100-62.5-25 MCG/INH AEPB Inhale 100 mcg into the lungs daily.  . furosemide (LASIX) 40 MG tablet TAKE 1 TABLET BY MOUTH EVERY DAY TAKE AN ADDITIONAL TABLET FOR WT GAIN/LEG SWELLING  . KLOR-CON 10 10 MEQ tablet TAKE 2 TABS TWICE DAILY TAKE ADDITIONAL 2 TABS IF TAKE ADDITIONAL LASIX 40  . levofloxacin (LEVAQUIN) 500 MG tablet Take 1 tablet (500 mg total) by mouth daily.  . metolazone (ZAROXOLYN) 2.5 MG tablet As needed for increase in swelling  . metoprolol tartrate (LOPRESSOR) 50 MG tablet Take 1 tablet (50 mg total) by mouth 2 (two) times daily.  . naproxen sodium (ANAPROX) 220 MG tablet Take 220 mg by mouth 2 (two) times daily as needed (back pain).  . nystatin cream (MYCOSTATIN) Apply 1 application topically 2 (two) times daily.  Marland Kitchen OVER THE COUNTER MEDICATION Take 100 mg by mouth at  bedtime. Equate sleep medicine  . rivaroxaban (XARELTO) 20 MG TABS tablet Take 1 tablet (20 mg total) by mouth daily with supper. Needs office visit for further refills [phone]  . traZODone (DESYREL) 100 MG tablet TAKE 1/2-1 TABLET BY MOUTH FOR SLEEP  . triamcinolone cream (KENALOG) 0.5 % Apply 1 application topically 2 (two) times daily.   Current Facility-Administered Medications on File Prior to Visit  Medication  . ipratropium-albuterol (DUONEB) 0.5-2.5 (3) MG/3ML nebulizer solution 3 mL   Medical History:  Past Medical History:  Diagnosis Date  . Anemia   . Anxiety   . Atrial flutter ([place])   . Borderline diabetes   . Chronic diastolic heart failure ([place])   . COPD (chronic obstructive pulmonary disease) ([place])   . Depression   . GERD (gastroesophageal reflux disease)   . HTN (hypertension)   . Hyperlipidemia   .  Obstructive sleep apnea   . Persistent atrial fibrillation ([place])    Myoview 4/09: No ischemia;  echo 4/09: EF 60-65%;   Flecainide, beta blocker, Coumadin;    Unable to take Tikosyn 2/2 prolonged QT  . Pulmonary embolism ([facility])    Right lower lobe diagnosed by CT 6/12  . Type II or unspecified type diabetes mellitus without mention of complication, not stated as uncontrolled    Allergies:  Allergies  Allergen Reactions  . Sulfonamide Derivatives Hives and Itching  . Tikosyn [Dofetilide]     Prolonged qt    Review of Systems:  See HPI  Family history- Review and unchanged Social history- Review and unchanged Physical Exam: BP 100/66   Pulse 81   Temp (!) 97.5 F (36.4 C)   Resp 16   Ht _0  (1.499 m)   Wt 245 lb 9.6 oz (111.4 kg)   SpO2 90%   BMI 49.61 kg/m  Wt Readings from Last 3 Encounters:  05/03/17 245 lb 9.6 oz (111.4 kg)  01/05/17 240 lb (108.9 kg)  11/15/16 239 lb 12.8 oz (108.8 kg)   General Appearance: Well nourished, obese, in no apparent distress. Eyes: PERRLA, EOMs, conjunctiva no swelling or erythema Sinuses: No Frontal/maxillary  tenderness ENT/Mouth: Ext aud canals clear, TMs without erythema, bulging. No erythema, swelling, or exudate on post pharynx.Tonsils not swollen or erythematous. Hearing normal. Crowded mouth Neck: Supple, thyroid normal.  Respiratory: Decreased breath sounds bilateral, with wheezing and decreased breath sounds bilateral lower lobes Cardio: Irreg, irreg,  minimal edema Abdomen: Soft, + BS, obese,  Non tender, no guarding, rebound, hernias, masses. Lymphatics: Non tender without lymphadenopathy.  Musculoskeletal: Full ROM, 5/5 strength, antalgic gait Skin: well healing scar on left nostril, bilateral feet with white fungus, some thickening at toes, no ulcers. Warm, dry without rashes, lesions, ecchymosis.  Neuro: Cranial nerves intact. No cerebellar symptoms.  Psych: Awake and oriented X 3,  Insight and Judgment appropriate.    Vicie Mutters, PA-C 5:12 PM [facility]

## 2017-05-03 ENCOUNTER — Encounter: Payer: Self-pay | Admitting: Physician Assistant

## 2017-05-03 ENCOUNTER — Ambulatory Visit: Payer: BLUE CROSS/BLUE SHIELD | Admitting: Physician Assistant

## 2017-05-03 VITALS — BP 100/66 | HR 81 | Temp 97.5°F | Resp 16 | Ht 59.0 in | Wt 245.6 lb

## 2017-05-03 DIAGNOSIS — E1122 Type 2 diabetes mellitus with diabetic chronic kidney disease: Secondary | ICD-10-CM | POA: Diagnosis not present

## 2017-05-03 DIAGNOSIS — E785 Hyperlipidemia, unspecified: Secondary | ICD-10-CM

## 2017-05-03 DIAGNOSIS — I2723 Pulmonary hypertension due to lung diseases and hypoxia: Secondary | ICD-10-CM | POA: Diagnosis not present

## 2017-05-03 DIAGNOSIS — R7309 Other abnormal glucose: Secondary | ICD-10-CM | POA: Diagnosis not present

## 2017-05-03 DIAGNOSIS — I482 Chronic atrial fibrillation, unspecified: Secondary | ICD-10-CM

## 2017-05-03 DIAGNOSIS — G4733 Obstructive sleep apnea (adult) (pediatric): Secondary | ICD-10-CM

## 2017-05-03 DIAGNOSIS — N184 Chronic kidney disease, stage 4 (severe): Secondary | ICD-10-CM

## 2017-05-03 DIAGNOSIS — I4892 Unspecified atrial flutter: Secondary | ICD-10-CM | POA: Diagnosis not present

## 2017-05-03 DIAGNOSIS — F331 Major depressive disorder, recurrent, moderate: Secondary | ICD-10-CM

## 2017-05-03 DIAGNOSIS — J449 Chronic obstructive pulmonary disease, unspecified: Secondary | ICD-10-CM

## 2017-05-03 DIAGNOSIS — I2699 Other pulmonary embolism without acute cor pulmonale: Secondary | ICD-10-CM | POA: Diagnosis not present

## 2017-05-03 DIAGNOSIS — I159 Secondary hypertension, unspecified: Secondary | ICD-10-CM | POA: Diagnosis not present

## 2017-05-03 DIAGNOSIS — I5033 Acute on chronic diastolic (congestive) heart failure: Secondary | ICD-10-CM

## 2017-05-03 MED ORDER — ALBUTEROL SULFATE HFA 108 (90 BASE) MCG/ACT IN AERS: 2.0000 | INHALATION_SPRAY | Freq: Four times a day (QID) | RESPIRATORY_TRACT | 1 refills | Status: DC | PRN

## 2017-05-03 NOTE — Patient Instructions (Addendum)
Please make sure that you are taking 2 of the lasix, Also please do not miss the once a week metolazone, if you do, take it as soon as you can Always take the metolzone 1 hour before the lasix  Monitor your weight daily, if your weight is up 5 lbs increase the lasix to 3 a day NEED TO GET SCALE AND WEIGHT DAILY  Will switch you back from the metoprolol to the bisoprolol  Do the nebulizer when you get home   Do the following things EVERYDAY: 1) Weigh yourself in the morning before breakfast or at the same time every day. Write it down and keep it in a log. 2) Take your medicines as prescribed 3) Eat low salt foods-Limit salt (sodium) to 2000 mg per day. Best thing to do is avoid processed foods.   4) Stay as active as you can everyday 5) Limit all fluids for the day to less than 1.5 liters  Call your doctor if:  Anytime you have any of the following symptoms:  1) 2 pound weight gain in 24 hours or 5 pounds in 1 week  2) shortness of breath, with or without a dry hacking cough  3) swelling in the hands, LEGs, feet or stomach  4) if you have to sleep on extra pillows at night in order to breathe. 5) after laying down at night for 20-30 mins, you wake up short of breath.   These can all be signs of fluid overload.    Heart Failure Heart failure means your heart has trouble pumping blood. This makes it hard for your body to work well. Heart failure is usually a long-term (chronic) condition. You must take good care of yourself and follow your doctor's treatment plan. Follow these instructions at home:  Take your heart medicine as told by your doctor. ? Do not stop taking medicine unless your doctor tells you to. ? Do not skip any dose of medicine. ? Refill your medicines before they run out. ? Take other medicines only as told by your doctor or pharmacist.  Stay active if told by your doctor. The elderly and people with severe heart failure should talk with a doctor about physical  activity.  Eat heart-healthy foods. Choose foods that are without trans fat and are low in saturated fat, cholesterol, and salt (sodium). This includes fresh or frozen fruits and vegetables, fish, lean meats, fat-free or low-fat dairy foods, whole grains, and high-fiber foods. Lentils and dried peas and beans (legumes) are also good choices.  Limit salt if told by your doctor.  Cook in a healthy way. Roast, grill, broil, bake, poach, steam, or stir-fry foods.  Limit fluids as told by your doctor.  Weigh yourself every morning. Do this after you pee (urinate) and before you eat breakfast. Write down your weight to give to your doctor.  Take your blood pressure and write it down if your doctor tells you to.  Ask your doctor how to check your pulse. Check your pulse as told.  Lose weight if told by your doctor.  Stop smoking or chewing tobacco. Do not use gum or patches that help you quit without your doctor's approval.  Schedule and go to doctor visits as told.  Nonpregnant women should have no more than 1 drink a day. Men should have no more than 2 drinks a day. Talk to your doctor about drinking alcohol.  Stop illegal drug use.  Stay current with shots (immunizations).  Manage your health  conditions as told by your doctor.  Learn to manage your stress.  Rest when you are tired.  If it is really hot outside: ? Avoid intense activities. ? Use air conditioning or fans, or get in a cooler place. ? Avoid caffeine and alcohol. ? Wear loose-fitting, lightweight, and light-colored clothing.  If it is really cold outside: ? Avoid intense activities. ? Layer your clothing. ? Wear mittens or gloves, a hat, and a scarf when going outside. ? Avoid alcohol.  Learn about heart failure and get support as needed.  Get help to maintain or improve your quality of life and your ability to care for yourself as needed. Contact a doctor if:  You gain weight quickly.  You are more short  of breath than usual.  You cannot do your normal activities.  You tire easily.  You cough more than normal, especially with activity.  You have any or more puffiness (swelling) in areas such as your hands, feet, ankles, or belly (abdomen).  You cannot sleep because it is hard to breathe.  You feel like your heart is beating fast (palpitations).  You get dizzy or light-headed when you stand up. Get help right away if:  You have trouble breathing.  There is a change in mental status, such as becoming less alert or not being able to focus.  You have chest pain or discomfort.  You faint. This information is not intended to replace advice given to you by your health care provider. Make sure you discuss any questions you have with your health care provider. Document Released: 11/04/2007 Document Revised: 07/03/2015 Document Reviewed: 03/13/2012 Else[employer] Interactive Patient Education  2017 [employer]   Chronic Obstructive Pulmonary Disease Exacerbation Chronic obstructive pulmonary disease (COPD) is a long-term (chronic) condition that affects the lungs. COPD is a general term that can be used to describe many different lung problems that cause lung swelling (inflammation) and limit airflow, including chronic bronchitis and emphysema. COPD exacerbations are episodes when breathing symptoms become much worse and require extra treatment. COPD exacerbations are usually caused by infections. Without treatment, COPD exacerbations can be severe and even life threatening. Frequent COPD exacerbations can cause further damage to the lungs. What are the causes? This condition may be caused by:  Respiratory infections, including viral and bacterial infections.  Exposure to smoke.  Exposure to air pollution, chemical fumes, or dust.  Things that give you an allergic reaction (allergens).  Not taking your usual COPD medicines as directed.  Underlying medical problems, such as congestive  heart failure or infections not involving the lungs.  In many cases, the cause (trigger) of this condition is not known. What increases the risk? The following factors may make you more likely to develop this condition:  Smoking cigarettes.  Old age.  Frequent prior COPD exacerbations.  What are the signs or symptoms? Symptoms of this condition include:  Increased coughing.  Increased production of mucus from your lungs (sputum).  Increased wheezing.  Increased shortness of breath.  Rapid or labored breathing.  Chest tightness.  Less energy than usual.  Sleep disruption from symptoms.  Confusion or increased sleepiness.  Often these symptoms happen or get worse even with the use of medicines. How is this diagnosed? This condition is diagnosed based on:  Your medical history.  A physical exam.  You may also have tests, including:  A chest X-ray.  Blood tests.  Lung (pulmonary) function tests.  How is this treated? Treatment for this condition depends on  the severity and cause of the symptoms. You may need to be admitted to a hospital for treatment. Some of the treatments commonly used to treat COPD exacerbations are:  Antibiotic medicines. These may be used for severe exacerbations caused by a lung infection, such as pneumonia.  Bronchodilators. These are inhaled medicines that expand the air passages and allow increased airflow.  Steroid medicines. These act to reduce inflammation in the airways. They may be given with an inhaler, taken by mouth, or given through an IV tube inserted into one of your veins.  Supplemental oxygen therapy.  Airway clearing techniques, such as noninvasive ventilation (NIV) and positive expiratory pressure (PEP). These provide respiratory support through a mask or other noninvasive device. An example of this would be using a continuous positive airway pressure (CPAP) machine to improve delivery of oxygen into your  lungs.  Follow these instructions at home: Medicines  Take over-the-counter and prescription medicines only as told by your health care provider. It is important to use correct technique with inhaled medicines.  If you were prescribed an antibiotic medicine or oral steroid, take it as told by your health care provider. Do not stop taking the medicine even if you start to feel better. Lifestyle  Eat a healthy diet.  Exercise regularly.  Get plenty of sleep.  Avoid exposure to all substances that irritate the airway, especially to tobacco smoke.  Wash your hands often with soap and water to reduce the risk of infection. If soap and water are not available, use hand sanitizer.  During flu season, avoid enclosed spaces that are crowded with people. General instructions  Drink enough fluid to keep your urine clear or pale yellow (unless you have a medical condition that requires fluid restriction).  Use a cool mist vaporizer. This humidifies the air and makes it easier for you to clear your chest when you cough.  If you have a home nebulizer and oxygen, continue to use them as told by your health care provider.  Keep all follow-up visits as told by your health care provider. This is important. How is this prevented?  Stay up-to-date on pneumococcal and influenza (flu) vaccines. A flu shot is recommended every year to help prevent exacerbations.  Do not use any products that contain nicotine or tobacco, such as cigarettes and e-cigarettes. Quitting smoking is very important in preventing COPD from getting worse and in preventing exacerbations from happening as often. If you need help quitting, ask your health care provider.  Follow all instructions for pulmonary rehabilitation after a recent exacerbation. This can help prevent future exacerbations.  Work with your health care provider to develop and follow an action plan. This tells you what steps to take when you experience certain  symptoms. Contact a health care provider if:  You have a worsening of your regular COPD symptoms. Get help right away if:  You have worsening shortness of breath, even when resting.  You have trouble talking.  You have severe chest pain.  You cough up blood.  You have a fever.  You have weakness, vomit repeatedly, or faint.  You feel confused.  You are not able to sleep because of your symptoms.  You have trouble doing daily activities. Summary  COPD exacerbations are episodes when breathing symptoms become much worse and require extra treatment above your normal treatment.  Exacerbations can be severe and even life threatening. Frequent COPD exacerbations can cause further damage to your lungs.  COPD exacerbations are usually triggered by infections  such as the flu, colds, and even pneumonia.  Treatment for this condition depends on the severity and cause of the symptoms. You may need to be admitted to a hospital for treatment.  Quitting smoking is very important to prevent COPD from getting worse and to prevent exacerbations from happening as often. This information is not intended to replace advice given to you by your health care provider. Make sure you discuss any questions you have with your health care provider. Document Released: 11/22/2006 Document Revised: 03/01/2016 Document Reviewed: 03/01/2016 [employer] Interactive Patient Education  [employer].

## 2017-05-04 LAB — BASIC METABOLIC PANEL WITH GFR
BUN/Creatinine Ratio: 16 (calc) (ref 6–22)
BUN: 24 mg/dL (ref 7–25)
CO2: 32 mmol/L (ref 20–32)
Calcium: 9.2 mg/dL (ref 8.6–10.4)
Chloride: 100 mmol/L (ref 98–110)
Creat: 1.48 mg/dL — ABNORMAL HIGH (ref 0.50–1.05)
GFR, Est African American: 45 mL/min/{1.73_m2} — ABNORMAL LOW (ref >=60)
GFR, Est Non African American: 39 mL/min/{1.73_m2} — ABNORMAL LOW (ref >=60)
Glucose, Bld: 90 mg/dL (ref 65–99)
Potassium: 3.2 mmol/L — ABNORMAL LOW (ref 3.5–5.3)
Sodium: 144 mmol/L (ref 135–146)

## 2017-05-04 LAB — CBC WITH DIFFERENTIAL/PLATELET
Basophils Absolute: 40 cells/uL (ref 0–200)
Basophils Relative: 0.5 %
Eosinophils Absolute: 79 cells/uL (ref 15–500)
Eosinophils Relative: 1 %
HCT: 40 % (ref 35.0–45.0)
Hemoglobin: 13.5 g/dL (ref 11.7–15.5)
Lymphs Abs: 2133 cells/uL (ref 850–3900)
MCH: 28.4 pg (ref 27.0–33.0)
MCHC: 33.8 g/dL (ref 32.0–36.0)
MCV: 84.2 fL (ref 80.0–100.0)
MPV: 11.4 fL (ref 7.5–12.5)
Monocytes Relative: 6.8 %
Neutro Abs: 5111 cells/uL (ref 1500–7800)
Neutrophils Relative %: 64.7 %
Platelets: 238 10*3/uL (ref 140–400)
RBC: 4.75 10*6/uL (ref 3.80–5.10)
RDW: 13.6 % (ref 11.0–15.0)
Total Lymphocyte: 27 %
WBC mixed population: 537 cells/uL (ref 200–950)
WBC: 7.9 10*3/uL (ref 3.8–10.8)

## 2017-05-04 LAB — HEMOGLOBIN A1C
Hgb A1c MFr Bld: 6.1 % of total Hgb — ABNORMAL HIGH (ref <5.7)
Mean Plasma Glucose: 128 (calc)
eAG (mmol/L): 7.1 (calc)

## 2017-05-04 LAB — LIPID PANEL
Cholesterol: 212 mg/dL — ABNORMAL HIGH (ref <200)
HDL: 46 mg/dL — ABNORMAL LOW (ref >50)
LDL Cholesterol (Calc): 132 mg/dL (calc) — ABNORMAL HIGH
Non-HDL Cholesterol (Calc): 166 mg/dL (calc) — ABNORMAL HIGH (ref <130)
Total CHOL/HDL Ratio: 4.6 (calc) (ref <5.0)
Triglycerides: 196 mg/dL — ABNORMAL HIGH (ref <150)

## 2017-05-04 LAB — HEPATIC FUNCTION PANEL
AG Ratio: 1.4 (calc) (ref 1.0–2.5)
ALT: 29 U/L (ref 6–29)
AST: 22 U/L (ref 10–35)
Albumin: 4.2 g/dL (ref 3.6–5.1)
Alkaline phosphatase (APISO): 80 U/L (ref 33–130)
Bilirubin, Direct: 0.1 mg/dL (ref 0.0–0.2)
Globulin: 2.9 g/dL (calc) (ref 1.9–3.7)
Indirect Bilirubin: 0.4 mg/dL (calc) (ref 0.2–1.2)
Total Bilirubin: 0.5 mg/dL (ref 0.2–1.2)
Total Protein: 7.1 g/dL (ref 6.1–8.1)

## 2017-05-04 LAB — TSH: TSH: 2.21 mIU/L (ref 0.40–4.50)

## 2017-05-09 ENCOUNTER — Other Ambulatory Visit: Payer: Self-pay

## 2017-05-09 DIAGNOSIS — I5032 Chronic diastolic (congestive) heart failure: Secondary | ICD-10-CM

## 2017-05-09 MED ORDER — BISOPROLOL FUMARATE 5 MG PO TABS: 5.0000 mg | ORAL_TABLET | Freq: Every day | ORAL | 3 refills | Status: DC

## 2017-05-14 DIAGNOSIS — G4733 Obstructive sleep apnea (adult) (pediatric): Secondary | ICD-10-CM | POA: Diagnosis not present

## 2017-05-16 ENCOUNTER — Encounter: Payer: Self-pay | Admitting: Nurse Practitioner

## 2017-05-17 NOTE — Progress Notes (Signed)
[facility]  PATIENT: [name] DOB: 04-04-1960   REASON FOR VISIT: Follow-up for obstructive sleep apnea  CPAP compliance check HISTORY FROM: Patient    HISTORY OF PRESENT ILLNESS:UPDATE 4/10/2019CM [name], 57 year old female returns for follow-up.  Obstructive sleep apnea and is here for CPAP compliance.  Data dated 04/17/2017 2 05/16/2017 shows compliance greater than 4 hours at 100%.  Average usage 9 hours 16 minutes.  Set pressure of 12 cm EPR 3 AHI 0.3 ESS 5.  She is having no problems with her machine.  She returns for reevaluation  UPDATE 10/08/2018CM [name], 57 year old female returns for follow-up. She has obstructive sleep apnea. Sleep study found patient has severe obstructive sleep apnea. This is her first CPAP compliance check. Data dated 10/16/2016- 11/14/2016 shows compliance greater than 4 hours for 23 days for 77%. Average usage 9 hours 33 minutes. Set pressure 12 cm. EPR 3. AHI 0.7. Minimal leak ESS 5 returns for reevaluation    08/04/16 CDDonna K Sans is a 57 y.o. female , seen here as in a referral from Dr. Idell Pickles PA Vicie Mutters.  This is my first encounter with [name] on 08/04/2016. The patient reports that she was diagnosed with sleep apnea at an AHI of 77, at the time at [address]. This sleep evaluation was initiated after her daughter had witnessed the patient having apnea during a hospitalization, she stayed at her bedside. The patient has an extensive and significant medical history I would like to introduce her past and current medical history as it makes the evaluation of sleep apnea urgent. She has chronic diastolic heart failure with water retention, shortness of breath, and orthopnea. She has chronic atrial fibrillation but is rate controlled, she has secondary hypertension she has chronic obstructive pulmonary disease and requires nebulizers and breathing treatments. She is  considered super obese at a BMI of 50. CKD grade 4 with a GFR of 20.   [name] has presented with fatigue and weight gain and her primary care physician once urgently to put her back on CPAP. Her cardiologist is Dr. Britta Mccreedy long at the heart failure clinic, who is equally interested in getting her back on positive airway pressure especially since COPD overlap is also present. I would like to add that the patient experienced pulmonary embolism during an attack of atrial flutter and that she has hypothyroidism as well, anemia and poorly controlled diabetes type 2. Her review of symptoms is positive for weight gain and fatigue palpitations leg swelling hearing loss and tinnitus constipation joint pain aching muscles, weakness, numbness, confusion, dizziness, sleepiness, decreased energy and is interested activities.   Sleep habits are as follows: The patient takes 166mg of trazodone to allow her to sleep, otherwise she states her mind is racing and she cannot find a way to sleep. This is around 8 PM when she takes the medication, bedtime is around 9:30 PM and the medication has started to work. She stays asleep for about 2-3 hours -her sleep is fragmented by bathroom breaks up to 4 at night. She looks much better in a chair or with her back of the bed elevated that in bed. And her sleep usually lasts longer and is less fragmented. But her legs swell more. She does report vivid dreams. She does not report palpitations, diaphoresis, nausea or dizziness at night. When she sleeps in bed she sleeps on her side, on multiple pillows for head support. Her alarm rings  at 6:30 and she usually is ready to leave the house by 7.15 AM.     REVIEW OF SYSTEMS: Full 14 system review of systems performed and notable only for those listed, all others are neg:  Constitutional: neg  Cardiovascular: neg Ear/Nose/Throat: neg  Skin: neg Eyes: neg Respiratory: Shortness of breath Gastroitestinal: neg    Hematology/Lymphatic: Easy bruising Endocrine: neg Musculoskeletal:neg Allergy/Immunology: neg Neurological: neg Psychiatric: Anxiety Sleep : Obstructive sleep apnea with CPAP   ALLERGIES: Allergies  Allergen Reactions  . Sulfonamide Derivatives Hives and Itching  . Tikosyn [Dofetilide]     Prolonged qt    HOME MEDICATIONS: Outpatient Medications Prior to Visit  Medication Sig Dispense Refill  . albuterol (PROAIR HFA) 108 (90 Base) MCG/ACT inhaler Inhale 2 puffs into the lungs every 6 (six) hours as needed for wheezing or shortness of breath. 8 g 1  . albuterol (PROVENTIL) (2.5 MG/3ML) 0.083% nebulizer solution Take 3 mLs (2.5 mg total) by nebulization every 6 (six) hours as needed for wheezing or shortness of breath. 75 mL 12  . aspirin-acetaminophen-caffeine (EXCEDRIN MIGRAINE) [phone] MG per tablet Take 1 tablet by mouth every 6 (six) hours as needed for headache.    . bisoprolol (ZEBETA) 5 MG tablet Take 1 tablet (5 mg total) by mouth daily. 90 tablet 3  . citalopram (CELEXA) 40 MG tablet TAKE 1 TABLET BY MOUTH EVERY DAY 30 tablet 2  . diltiazem (CARDIZEM CD) 360 MG 24 hr capsule Take 1 capsule (360 mg total) by mouth daily. Needs office visit for further refills [phone] 30 capsule 1  . Fluticasone-Umeclidin-Vilant (TRELEGY ELLIPTA) 100-62.5-25 MCG/INH AEPB Inhale 100 mcg into the lungs daily. 60 each 6  . furosemide (LASIX) 40 MG tablet TAKE 1 TABLET BY MOUTH EVERY DAY TAKE AN ADDITIONAL TABLET FOR WT GAIN/LEG SWELLING 120 tablet 3  . KLOR-CON 10 10 MEQ tablet TAKE 2 TABS TWICE DAILY TAKE ADDITIONAL 2 TABS IF TAKE ADDITIONAL LASIX 40 400 tablet 1  . metolazone (ZAROXOLYN) 2.5 MG tablet As needed for increase in swelling 90 tablet 3  . naproxen sodium (ANAPROX) 220 MG tablet Take 220 mg by mouth 2 (two) times daily as needed (back pain).    . nystatin cream (MYCOSTATIN) Apply 1 application topically 2 (two) times daily. 30 g 1  . OVER THE COUNTER MEDICATION Take 100 mg  by mouth at bedtime. Equate sleep medicine    . polyethylene glycol (MIRALAX / GLYCOLAX) packet Take 17 g by mouth daily as needed.    . rivaroxaban (XARELTO) 20 MG TABS tablet Take 1 tablet (20 mg total) by mouth daily with supper. Needs office visit for further refills [phone] 30 tablet 1  . traZODone (DESYREL) 100 MG tablet TAKE 1/2-1 TABLET BY MOUTH FOR SLEEP 90 tablet 1  . triamcinolone cream (KENALOG) 0.5 % Apply 1 application topically 2 (two) times daily. 80 g 2  . metoprolol tartrate (LOPRESSOR) 50 MG tablet Take 1 tablet (50 mg total) by mouth 2 (two) times daily. 60 tablet 3  . ALPRAZolam (XANAX) 0.5 MG tablet 1/2-1 pill PRN before procedure, if need to can take 1 at one time. Do not drive if taking. (Patient not taking: Reported on 05/18/2017) 30 tablet 0  . atorvastatin (LIPITOR) 20 MG tablet Take 1 tablet (20 mg total) by mouth daily. (Patient not taking: Reported on 05/18/2017) 30 tablet 11  . levofloxacin (LEVAQUIN) 500 MG tablet Take 1 tablet (500 mg total) by mouth daily. (Patient not taking: Reported on 05/18/2017) 10 tablet  0   Facility-Administered Medications Prior to Visit  Medication Dose Route Frequency Provider Last Rate Last Dose  . ipratropium-albuterol (DUONEB) 0.5-2.5 (3) MG/3ML nebulizer solution 3 mL  3 mL Nebulization Once Vicie Mutters, PA-C        PAST MEDICAL HISTORY: Past Medical History:  Diagnosis Date  . Anemia   . Anxiety   . Atrial flutter ([place])   . Borderline diabetes   . Chronic diastolic heart failure ([place])   . COPD (chronic obstructive pulmonary disease) ([place])   . Depression   . GERD (gastroesophageal reflux disease)   . HTN (hypertension)   . Hyperlipidemia   . Obstructive sleep apnea   . Persistent atrial fibrillation ([place])    Myoview 4/09: No ischemia;  echo 4/09: EF 60-65%;   Flecainide, beta blocker, Coumadin;    Unable to take Tikosyn 2/2 prolonged QT  . Pulmonary embolism (HCC)    Right lower lobe diagnosed by CT 6/12  . Type  II or unspecified type diabetes mellitus without mention of complication, not stated as uncontrolled     PAST SURGICAL HISTORY: Past Surgical History:  Procedure Laterality Date  . basal skin can    . CARDIOVERSION  01/19/2012   Procedure: CARDIOVERSION;  Surgeon: Jolaine Artist, MD;  Location: [facility] ENDOSCOPY;  Service: Cardiovascular;  Laterality: N/A;  . CARDIOVERSION  01/21/2012   Procedure: CARDIOVERSION;  Surgeon: Carlena Bjornstad, MD;  Location: [facility];  Service: Cardiovascular;  Laterality: N/A;  Rm 4733  . TEE WITHOUT CARDIOVERSION  01/19/2012   Procedure: TRANSESOPHAGEAL ECHOCARDIOGRAM (TEE);  Surgeon: Jolaine Artist, MD;  Location: [facility] ENDOSCOPY;  Service: Cardiovascular;  Laterality: N/A;  . TUBAL LIGATION  1996    FAMILY HISTORY: Family History  Problem Relation Age of Onset  . Colon cancer Mother   . Coronary artery disease Mother   . Coronary artery disease Father   . Asthma Father   . Emphysema Father   . Allergies Father   . Heart attack Maternal Grandfather   . Heart disease Maternal Grandmother   . Depression Daughter     SOCIAL HISTORY: Social History   Socioeconomic History  . Marital status: Legally Separated    Spouse name: n/a  . Number of children: 4  . Years of education: 59  . Highest education level: Not on file  Occupational History  . Occupation: clerical    Comment: Manufacturing engineer  Social Needs  . Financial resource strain: Not on file  . Food insecurity:    Worry: Not on file    Inability: Not on file  . Transportation needs:    Medical: Not on file    Non-medical: Not on file  Tobacco Use  . Smoking status: Former Smoker    Packs/day: 2.00    Years: 35.00    Pack years: 70.00    Types: Cigarettes    Last attempt to quit: 09/08/2009    Years since quitting: 7.6  . Smokeless tobacco: Never Used  Substance and Sexual Activity  . Alcohol use: No  . Drug use: No  . Sexual activity: Never    Birth control/protection:  None  Lifestyle  . Physical activity:    Days per week: Not on file    Minutes per session: Not on file  . Stress: Not on file  Relationships  . Social connections:    Talks on phone: Not on file    Gets together: Not on file    Attends religious service: Not on  file    Active member of club or organization: Not on file    Attends meetings of clubs or organizations: Not on file    Relationship status: Not on file  . Intimate partner violence:    Fear of current or ex partner: Not on file    Emotionally abused: Not on file    Physically abused: Not on file    Forced sexual activity: Not on file  Other Topics Concern  . Not on file  Social History Narrative   Married but currently separated with 4 children, 2 of whom still live with her.  Youngest son is a Equities trader in [employer], to graduate in 07/2012.     PHYSICAL EXAM  Vitals:   05/18/17 1536  BP: 96/63  Pulse: 75  Weight: 241 lb 9.6 oz (109.6 kg)  Height: _0  (1.499 m)   Body mass index is 48.8 kg/m.  Generalized: Well developed,morbidly obese female in no acute distress  Head: normocephalic and atraumatic,. Oropharynx benign  Neck: Supple,  Musculoskeletal: No deformity   Neurological examination   Mentation: Alert oriented to time, place, history taking. Attention span and concentration appropriate. Recent and remote memory intact.  Follows all commands speech and language fluent.   Cranial nerve II-XII: Pupils were equal round reactive to light extraocular movements were full, visual field were full on confrontational test. Facial sensation and strength were normal. hearing was intact to finger rubbing bilaterally. Uvula tongue midline. head turning and shoulder shrug were normal and symmetric.Tongue protrusion into cheek strength was normal. Motor: normal bulk and tone, full strength in the BUE, BLE, fine finger movements normal, no pronator drift. No focal weakness Sensory: normal and symmetric to light touch, in the  upper and lower extremities  Coordination: finger-nose-finger, heel-to-shin bilaterally, no dysmetria Reflexes: symmetric upper and lower, plantar responses were flexor bilaterally. Gait and Station: Rising up from seated position without assistance, normal stance,  moderate stride, good arm swing, smooth turning, able to perform tiptoe, and heel walking without difficulty. Tandem gait is steady. No assistive device  DIAGNOSTIC DATA (LABS, IMAGING, TESTING) - I reviewed patient records, labs, notes, testing and imaging myself where available.  Lab Results  Component Value Date   WBC 7.9 05/03/2017   HGB 13.5 05/03/2017   HCT 40.0 05/03/2017   MCV 84.2 05/03/2017   PLT 238 05/03/2017      Component Value Date/Time   NA 144 05/03/2017 1746   K 3.2 (L) 05/03/2017 1746   CL 100 05/03/2017 1746   CO2 32 05/03/2017 1746   GLUCOSE 90 05/03/2017 1746   BUN 24 05/03/2017 1746   CREATININE 1.48 (H) 05/03/2017 1746   CALCIUM 9.2 05/03/2017 1746   PROT 7.1 05/03/2017 1746   ALBUMIN 4.1 09/21/2016 1728   AST 22 05/03/2017 1746   ALT 29 05/03/2017 1746   ALKPHOS 85 09/21/2016 1728   BILITOT 0.5 05/03/2017 1746   GFRNONAA 39 (L) 05/03/2017 1746   GFRAA 45 (L) 05/03/2017 1746   Lab Results  Component Value Date   CHOL 212 (H) 05/03/2017   HDL 46 (L) 05/03/2017   LDLCALC 132 (H) 05/03/2017   TRIG 196 (H) 05/03/2017   CHOLHDL 4.6 05/03/2017   Lab Results  Component Value Date   HGBA1C 6.1 (H) 05/03/2017   Lab Results  Component Value Date   VITAMINB12 501 06/09/2016   Lab Results  Component Value Date   TSH 2.21 05/03/2017      ASSESSMENT AND PLAN  56  y.o. year old female  has a past medical history of Anemia; Anxiety; Atrial flutter ([place]); Borderline diabetes; Chronic diastolic heart failure (HCC); COPD (chronic obstructive pulmonary disease) ([place]); Depression; GERD (gastroesophageal reflux disease); HTN (hypertension); Hyperlipidemia; and severe Obstructive sleep apnea;  Persistent atrial fibrillation ([place]);  here to follow-up CPAP compliance . Data dated 04/17/2017 2 05/16/2017 shows compliance greater than 4 hours at 100%.  Average usage 9 hours 16 minutes.  Set pressure of 12 cm EPR 3 AHI 0.3 ESS 5. The patient is a current patient of Dr. Brett Fairy who is out of the office today . This note is sent to the work in doctor.      CPAP compliance is 100% reviewed compliance report with patient Continue same settings Follow-up in 6 months if stable then yearly I spent 24mn  in total face to face time with the patient more than 50% of which was spent counseling and coordination of care, reviewing test results reviewing medications and discussing and reviewing the diagnosis of obstructive sleep apnea reviewing the compliance report NDennie Bible [facility] APRN  [facility] [address] [place] [phone]

## 2017-05-18 ENCOUNTER — Ambulatory Visit: Payer: BLUE CROSS/BLUE SHIELD | Admitting: Nurse Practitioner

## 2017-05-18 ENCOUNTER — Encounter: Payer: Self-pay | Admitting: Nurse Practitioner

## 2017-05-18 VITALS — BP 96/63 | HR 75 | Ht 59.0 in | Wt 241.6 lb

## 2017-05-18 DIAGNOSIS — G4733 Obstructive sleep apnea (adult) (pediatric): Secondary | ICD-10-CM | POA: Diagnosis not present

## 2017-05-18 DIAGNOSIS — Z9989 Dependence on other enabling machines and devices: Secondary | ICD-10-CM | POA: Diagnosis not present

## 2017-05-18 NOTE — Patient Instructions (Signed)
CPAP compliance is 100% reviewed compliance report with patient Continue same settings Follow-up in 6 monthsif stable then yearly

## 2017-05-19 ENCOUNTER — Ambulatory Visit (HOSPITAL_COMMUNITY)
Admission: RE | Admit: 2017-05-19 | Discharge: 2017-05-19 | Disposition: A | Discharge status: Home / Self Care | Payer: BLUE CROSS/BLUE SHIELD | Source: Ambulatory Visit | Attending: Internal Medicine | Admitting: Internal Medicine

## 2017-05-19 ENCOUNTER — Encounter (HOSPITAL_COMMUNITY): Payer: Self-pay | Admitting: Internal Medicine

## 2017-05-19 VITALS — BP 110/74 | HR 74 | Wt 242.1 lb

## 2017-05-19 DIAGNOSIS — N183 Chronic kidney disease, stage 3 unspecified: Secondary | ICD-10-CM

## 2017-05-19 DIAGNOSIS — I482 Chronic atrial fibrillation: Secondary | ICD-10-CM | POA: Insufficient documentation

## 2017-05-19 DIAGNOSIS — F419 Anxiety disorder, unspecified: Secondary | ICD-10-CM | POA: Insufficient documentation

## 2017-05-19 DIAGNOSIS — E876 Hypokalemia: Secondary | ICD-10-CM | POA: Diagnosis not present

## 2017-05-19 DIAGNOSIS — E1122 Type 2 diabetes mellitus with diabetic chronic kidney disease: Secondary | ICD-10-CM | POA: Diagnosis not present

## 2017-05-19 DIAGNOSIS — G4733 Obstructive sleep apnea (adult) (pediatric): Secondary | ICD-10-CM | POA: Insufficient documentation

## 2017-05-19 DIAGNOSIS — I4892 Unspecified atrial flutter: Secondary | ICD-10-CM

## 2017-05-19 DIAGNOSIS — Z86711 Personal history of pulmonary embolism: Secondary | ICD-10-CM | POA: Diagnosis not present

## 2017-05-19 DIAGNOSIS — I5032 Chronic diastolic (congestive) heart failure: Secondary | ICD-10-CM

## 2017-05-19 DIAGNOSIS — Z882 Allergy status to sulfonamides status: Secondary | ICD-10-CM | POA: Diagnosis not present

## 2017-05-19 DIAGNOSIS — J449 Chronic obstructive pulmonary disease, unspecified: Secondary | ICD-10-CM | POA: Insufficient documentation

## 2017-05-19 DIAGNOSIS — E669 Obesity, unspecified: Secondary | ICD-10-CM | POA: Diagnosis not present

## 2017-05-19 DIAGNOSIS — E039 Hypothyroidism, unspecified: Secondary | ICD-10-CM | POA: Insufficient documentation

## 2017-05-19 DIAGNOSIS — Z888 Allergy status to other drugs, medicaments and biological substances status: Secondary | ICD-10-CM | POA: Insufficient documentation

## 2017-05-19 DIAGNOSIS — I130 Hypertensive heart and chronic kidney disease with heart failure and stage 1 through stage 4 chronic kidney disease, or unspecified chronic kidney disease: Secondary | ICD-10-CM | POA: Insufficient documentation

## 2017-05-19 DIAGNOSIS — E785 Hyperlipidemia, unspecified: Secondary | ICD-10-CM | POA: Insufficient documentation

## 2017-05-19 DIAGNOSIS — Z79899 Other long term (current) drug therapy: Secondary | ICD-10-CM | POA: Insufficient documentation

## 2017-05-19 DIAGNOSIS — Z7901 Long term (current) use of anticoagulants: Secondary | ICD-10-CM | POA: Insufficient documentation

## 2017-05-19 DIAGNOSIS — Z6841 Body Mass Index (BMI) 40.0 and over, adult: Secondary | ICD-10-CM | POA: Insufficient documentation

## 2017-05-19 DIAGNOSIS — K219 Gastro-esophageal reflux disease without esophagitis: Secondary | ICD-10-CM | POA: Insufficient documentation

## 2017-05-19 DIAGNOSIS — F329 Major depressive disorder, single episode, unspecified: Secondary | ICD-10-CM | POA: Insufficient documentation

## 2017-05-19 DIAGNOSIS — Z7984 Long term (current) use of oral hypoglycemic drugs: Secondary | ICD-10-CM | POA: Insufficient documentation

## 2017-05-19 LAB — BASIC METABOLIC PANEL
Anion gap: 15 (ref 5–15)
BUN: 33 mg/dL — ABNORMAL HIGH (ref 6–20)
CO2: 30 mmol/L (ref 22–32)
Calcium: 9.4 mg/dL (ref 8.9–10.3)
Chloride: 97 mmol/L — ABNORMAL LOW (ref 101–111)
Creatinine, Ser: 1.81 mg/dL — ABNORMAL HIGH (ref 0.44–1.00)
GFR calc Af Amer: 34 mL/min — ABNORMAL LOW (ref >60)
GFR calc non Af Amer: 30 mL/min — ABNORMAL LOW (ref >60)
Glucose, Bld: 102 mg/dL — ABNORMAL HIGH (ref 65–99)
Potassium: 3 mmol/L — ABNORMAL LOW (ref 3.5–5.1)
Sodium: 142 mmol/L (ref 135–145)

## 2017-05-19 MED ORDER — DILTIAZEM HCL ER COATED BEADS 360 MG PO CP24: 360.0000 mg | ORAL_CAPSULE | Freq: Every day | ORAL | 3 refills | Status: DC

## 2017-05-19 NOTE — Progress Notes (Signed)
I have read the note, and I agree with the clinical assessment and plan.  Sheyenne Konz A. Felecia Shelling, MD, PhD, [facility] Certified in Neurology, Clinical Neurophysiology, Sleep Medicine, Pain Medicine and Neuroimaging  [facility] [address] [place], [place] [address] [phone]

## 2017-05-19 NOTE — Progress Notes (Signed)
Patient ID: [name], female   DOB: Jun 09, 1960, 57 y.o.   MRN: [id number]  ADVANCED HF CLINIC NOTE PCP:  Dr. Idell Pickles  Primary Cardiologist:  Dr. [name] Pulmonologist: Dr Gwenette Greet EP: Dr Rayann Heman  History of Present Illness: [name] is a 57 y.o. female with a history of chronic diastolic heart failure, chronic AF, HTN, hyperlipidemia, sleep apnea, glucose intolerance, COPD and GERD.  She was admitted 6/2-6/11/2010.  Prior to admission, she had stopped all of her medications due to difficulties with finances.  She had been developing increasing dyspnea requiring several rounds of antibiotics and prednisone taper.  She presented to the hospital with narrow complex tachycardia treated with adenosine x2.  She developed normal sinus rhythm briefly and then converted to atrial flutter with rapid ventricular rate.  Myocardial infarction was ruled out.  She continued to have intermittent episodes of atrial flutter.  She was placed on flecainide.   She was seen by EP.  Given her multiple atrial arrhythmias, ablation therapy was not felt to be helpful and she was continued on flecainide.  She had continued dyspnea and CT scan demonstrated a right lower lobe nonocclusive pulmonary embolism.  She was placed on heparin and Coumadin.    Admitted 01/14/12 with afib with RVR (NSR 09/23/10) and volume overload.  During hospitalization Underwent TEE/DCCV and DCCV  which showed no thrombus.  She failed 2 cardioversions therefore rate control was pursued. Flecainide was stopped and transitioned to cardizem and amiodarone. She was also placed on Xarelto. Discharge weight 215 pounds.  She presents today for HF f/u. Overall doing well. Since we saw her found to have severe OSA. Doing great with CPAP. Has stayed quit from smoking. No further AF. Taking Xarelto without bleeding. + edema. No CP, orthopnea or PND. Takes lasix 2 in am and 1 in pm. Doesn't have to take extra.   06/17/2014 ECHO EF 55-60% perhaps  mild RV HK  05/31/16 EF EF 60-65% RV normal.   Labs  07/31/12 TSH 6.06 T4 1.14  ALT 40 11/22/12 K 3.5 Creatinine 1.15 09/06/13 K 3.9 12/18/13 K 3.8 Creatinine 1.3 Pro BNP 1242  01/01/14: K 4.5, creatinine 1.29 02/05/14: K 4.1 Creatinine 1.15 07/17/2014: K 4.1 Creatinine 1.53  02/2016 K 3.4, creatinine 1.5  SH; Works full time at [employer]. Lives with her 2 sons. Separated from her husband  ROS: All pertinent positives and negatives as in HPI, otherwise negative.     Past Medical History:  Diagnosis Date  . Anemia   . Anxiety   . Atrial flutter ([place])   . Borderline diabetes   . Chronic diastolic heart failure ([place])   . COPD (chronic obstructive pulmonary disease) ([place])   . Depression   . GERD (gastroesophageal reflux disease)   . HTN (hypertension)   . Hyperlipidemia   . Obstructive sleep apnea   . Persistent atrial fibrillation ([place])    Myoview 4/09: No ischemia;  echo 4/09: EF 60-65%;   Flecainide, beta blocker, Coumadin;    Unable to take Tikosyn 2/2 prolonged QT  . Pulmonary embolism (HCC)    Right lower lobe diagnosed by CT 6/12  . Type II or unspecified type diabetes mellitus without mention of complication, not stated as uncontrolled     Current Outpatient Medications  Medication Sig Dispense Refill  . albuterol (PROAIR HFA) 108 (90 Base) MCG/ACT inhaler Inhale 2 puffs into the lungs every 6 (six) hours as needed for wheezing or shortness of breath. 8 g 1  .  albuterol (PROVENTIL) (2.5 MG/3ML) 0.083% nebulizer solution Take 3 mLs (2.5 mg total) by nebulization every 6 (six) hours as needed for wheezing or shortness of breath. 75 mL 12  . ALPRAZolam (XANAX) 0.5 MG tablet 1/2-1 pill PRN before procedure, if need to can take 1 at one time. Do not drive if taking. (Patient not taking: Reported on 05/18/2017) 30 tablet 0  . aspirin-acetaminophen-caffeine (EXCEDRIN MIGRAINE) [phone] MG per tablet Take 1 tablet by mouth every 6 (six) hours as needed for headache.    Marland Kitchen  atorvastatin (LIPITOR) 20 MG tablet Take 1 tablet (20 mg total) by mouth daily. (Patient not taking: Reported on 05/18/2017) 30 tablet 11  . bisoprolol (ZEBETA) 5 MG tablet Take 1 tablet (5 mg total) by mouth daily. 90 tablet 3  . citalopram (CELEXA) 40 MG tablet TAKE 1 TABLET BY MOUTH EVERY DAY 30 tablet 2  . diltiazem (CARDIZEM CD) 360 MG 24 hr capsule Take 1 capsule (360 mg total) by mouth daily. Needs office visit for further refills [phone] 30 capsule 1  . Fluticasone-Umeclidin-Vilant (TRELEGY ELLIPTA) 100-62.5-25 MCG/INH AEPB Inhale 100 mcg into the lungs daily. 60 each 6  . furosemide (LASIX) 40 MG tablet TAKE 1 TABLET BY MOUTH EVERY DAY TAKE AN ADDITIONAL TABLET FOR WT GAIN/LEG SWELLING 120 tablet 3  . KLOR-CON 10 10 MEQ tablet TAKE 2 TABS TWICE DAILY TAKE ADDITIONAL 2 TABS IF TAKE ADDITIONAL LASIX 40 400 tablet 1  . metolazone (ZAROXOLYN) 2.5 MG tablet As needed for increase in swelling 90 tablet 3  . naproxen sodium (ANAPROX) 220 MG tablet Take 220 mg by mouth 2 (two) times daily as needed (back pain).    . nystatin cream (MYCOSTATIN) Apply 1 application topically 2 (two) times daily. 30 g 1  . OVER THE COUNTER MEDICATION Take 100 mg by mouth at bedtime. Equate sleep medicine    . polyethylene glycol (MIRALAX / GLYCOLAX) packet Take 17 g by mouth daily as needed.    . rivaroxaban (XARELTO) 20 MG TABS tablet Take 1 tablet (20 mg total) by mouth daily with supper. Needs office visit for further refills [phone] 30 tablet 1  . traZODone (DESYREL) 100 MG tablet TAKE 1/2-1 TABLET BY MOUTH FOR SLEEP 90 tablet 1  . triamcinolone cream (KENALOG) 0.5 % Apply 1 application topically 2 (two) times daily. 80 g 2   Current Facility-Administered Medications  Medication Dose Route Frequency Provider Last Rate Last Dose  . ipratropium-albuterol (DUONEB) 0.5-2.5 (3) MG/3ML nebulizer solution 3 mL  3 mL Nebulization Once Vicie Mutters, PA-C        Allergies: Allergies  Allergen Reactions  .  Sulfonamide Derivatives Hives and Itching  . Tikosyn [Dofetilide]     Prolonged qt   Vitals:   05/19/17 1458  BP: 110/74  Pulse: 74  SpO2: 93%  Weight: 242 lb 1.9 oz (109.8 kg)   Wt Readings from Last 3 Encounters:  05/18/17 241 lb 9.6 oz (109.6 kg)  05/03/17 245 lb 9.6 oz (111.4 kg)  01/05/17 240 lb (108.9 kg)    PHYSICAL EXAM: General:  Well appearing. No resp difficulty HEENT: normal Neck: supple. no JVD. Carotids 2+ bilat; no bruits. No lymphadenopathy or thryomegaly appreciated. Cor: PMI nondisplaced. Irregular rate & rhythm. No rubs, gallops or murmurs. Lungs: clear Abdomen: obese soft, nontender, nondistended. No hepatosplenomegaly. No bruits or masses. Good bowel sounds. Extremities: no cyanosis, clubbing, rash, edema Neuro: alert & orientedx3, cranial nerves grossly intact. moves all 4 extremities w/o difficulty. Affect pleasant  ECG:  AF 79. TWI Personally reviewed   ASSESSMENT AND PLAN:  1) Chronic diastolic HF:  EF 51%, mod MR (01/2012) - Echo EF 60-65%. RV appears normal in 4/18 - This is the best I have seen her ever. NYHA II. Volume status looks good.  - Continue current regimen. - Reinforced need for daily weights and reviewed use of sliding scale diuretics. - K remains low. Will add spiro 12.5 daily.Repeat BMET in 7-10 days 2) Obesity - Reinforced need for even modest weight loss.  3) Hyperlipidemia - Per PCP 4) Chronic Atrial Fibrillation.-- [place] Vasc Score 3.  - Has good rate control on ECG today and exam.   - off amiodarone.  - Continue bisoprolol 5 mg daily.  - Continue diltiazem 360 mg daily.  - Continue Xarelto 20 mg daily. Denies bleeding  5) DM- Continue metoformin. Per PCP.   - Discussed Jardiance but unable to afford at this point 6) Hypothyroid- - Per PCP.  7) OSA - much improved with OSA 8) CKD stage III  - Baseline creatine 1.5-2.1 - 1.8 today   [name], MD  2:58 PM

## 2017-05-19 NOTE — Patient Instructions (Signed)
Labs today  We will contact you in 1 year to schedule your next appointment and echocardiogram

## 2017-05-20 ENCOUNTER — Telehealth (HOSPITAL_COMMUNITY): Payer: Self-pay | Admitting: *Deleted

## 2017-05-20 DIAGNOSIS — I5022 Chronic systolic (congestive) heart failure: Secondary | ICD-10-CM

## 2017-05-20 MED ORDER — SPIRONOLACTONE 25 MG PO TABS: 12.5000 mg | ORAL_TABLET | Freq: Every day | ORAL | 3 refills | Status: DC

## 2017-05-20 NOTE — Telephone Encounter (Signed)
Result Notes for Basic metabolic panel   Notes recorded by Darron Doom, RN on 05/20/2017 at 3:24 PM EDT Patient called back and she is agreeable with plan. Medication sent to pharmacy, lab appt scheduled, and bmet ordered. ------  Notes recorded by Darron Doom, RN on 05/20/2017 at 10:13 AM EDT Called and left message asking for patient to call me back. ------  Notes recorded by Jolaine Artist, MD on 05/19/2017 at 6:16 PM EDT Start spiro 12.5. Recheck 1 week

## 2017-05-23 ENCOUNTER — Other Ambulatory Visit (HOSPITAL_COMMUNITY): Payer: Self-pay | Admitting: Student

## 2017-05-27 ENCOUNTER — Ambulatory Visit (HOSPITAL_COMMUNITY)
Admission: RE | Admit: 2017-05-27 | Discharge: 2017-05-27 | Disposition: A | Discharge status: Home / Self Care | Payer: BLUE CROSS/BLUE SHIELD | Source: Ambulatory Visit | Attending: Cardiology | Admitting: Cardiology

## 2017-05-27 DIAGNOSIS — I5022 Chronic systolic (congestive) heart failure: Secondary | ICD-10-CM | POA: Diagnosis not present

## 2017-05-27 LAB — BASIC METABOLIC PANEL
Anion gap: 13 (ref 5–15)
BUN: 24 mg/dL — ABNORMAL HIGH (ref 6–20)
CO2: 27 mmol/L (ref 22–32)
Calcium: 8.9 mg/dL (ref 8.9–10.3)
Chloride: 101 mmol/L (ref 101–111)
Creatinine, Ser: 1.54 mg/dL — ABNORMAL HIGH (ref 0.44–1.00)
GFR calc Af Amer: 42 mL/min — ABNORMAL LOW (ref >60)
GFR calc non Af Amer: 36 mL/min — ABNORMAL LOW (ref >60)
Glucose, Bld: 121 mg/dL — ABNORMAL HIGH (ref 65–99)
Potassium: 3.1 mmol/L — ABNORMAL LOW (ref 3.5–5.1)
Sodium: 141 mmol/L (ref 135–145)

## 2017-05-30 ENCOUNTER — Telehealth (HOSPITAL_COMMUNITY): Payer: Self-pay | Admitting: *Deleted

## 2017-05-30 DIAGNOSIS — E876 Hypokalemia: Secondary | ICD-10-CM

## 2017-05-30 DIAGNOSIS — I5022 Chronic systolic (congestive) heart failure: Secondary | ICD-10-CM

## 2017-05-30 MED ORDER — FUROSEMIDE 40 MG PO TABS: ORAL_TABLET | ORAL | 3 refills | Status: DC

## 2017-05-30 NOTE — Telephone Encounter (Signed)
-----  Message from Jolaine Artist, MD sent at 05/29/2017  2:47 PM EDT ----- KCL still low. Add kcl 20 daily  Recheck 1 week.

## 2017-05-30 NOTE — Telephone Encounter (Signed)
Notes recorded by Scarlette Calico, RN on 05/30/2017 at 10:34 AM EDT Spoke w/pt she is aware and agreeable. She states she currently take KCL 20 meq daily and then she takes and extra 20 meq if she takes extra Lasix. She will now increase KCL to 20 meq BID and will continue to take an extra 20 meq when she takes extra Lasix. Repeat lab sch for 4/29.

## 2017-06-01 ENCOUNTER — Other Ambulatory Visit: Payer: Self-pay | Admitting: Internal Medicine

## 2017-06-01 DIAGNOSIS — F329 Major depressive disorder, single episode, unspecified: Secondary | ICD-10-CM

## 2017-06-01 DIAGNOSIS — F32A Depression, unspecified: Secondary | ICD-10-CM

## 2017-06-06 ENCOUNTER — Ambulatory Visit (HOSPITAL_COMMUNITY)
Admission: RE | Admit: 2017-06-06 | Discharge: 2017-06-06 | Disposition: A | Discharge status: Home / Self Care | Payer: BLUE CROSS/BLUE SHIELD | Source: Ambulatory Visit | Attending: Cardiology | Admitting: Cardiology

## 2017-06-06 DIAGNOSIS — E876 Hypokalemia: Secondary | ICD-10-CM

## 2017-06-06 DIAGNOSIS — I5022 Chronic systolic (congestive) heart failure: Secondary | ICD-10-CM | POA: Insufficient documentation

## 2017-06-06 LAB — BASIC METABOLIC PANEL
Anion gap: 10 (ref 5–15)
BUN: 22 mg/dL — ABNORMAL HIGH (ref 6–20)
CO2: 27 mmol/L (ref 22–32)
Calcium: 9.1 mg/dL (ref 8.9–10.3)
Chloride: 105 mmol/L (ref 101–111)
Creatinine, Ser: 1.51 mg/dL — ABNORMAL HIGH (ref 0.44–1.00)
GFR calc Af Amer: 43 mL/min — ABNORMAL LOW (ref >60)
GFR calc non Af Amer: 37 mL/min — ABNORMAL LOW (ref >60)
Glucose, Bld: 136 mg/dL — ABNORMAL HIGH (ref 65–99)
Potassium: 3.9 mmol/L (ref 3.5–5.1)
Sodium: 142 mmol/L (ref 135–145)

## 2017-06-13 DIAGNOSIS — G4733 Obstructive sleep apnea (adult) (pediatric): Secondary | ICD-10-CM | POA: Diagnosis not present

## 2017-06-24 ENCOUNTER — Other Ambulatory Visit: Payer: Self-pay

## 2017-06-24 DIAGNOSIS — I5032 Chronic diastolic (congestive) heart failure: Secondary | ICD-10-CM

## 2017-06-24 MED ORDER — RIVAROXABAN 20 MG PO TABS: 20.0000 mg | ORAL_TABLET | Freq: Every day | ORAL | 1 refills | Status: DC

## 2017-06-24 NOTE — Telephone Encounter (Signed)
Xarelto refill request.

## 2017-06-26 ENCOUNTER — Other Ambulatory Visit: Payer: Self-pay | Admitting: Physician Assistant

## 2017-06-27 ENCOUNTER — Other Ambulatory Visit: Payer: Self-pay

## 2017-06-27 DIAGNOSIS — I5032 Chronic diastolic (congestive) heart failure: Secondary | ICD-10-CM

## 2017-06-27 NOTE — Telephone Encounter (Signed)
NOTE IN REFILL BOX THAT STATES PT NEEDS TO FOLLOW UP WITH PUL DR. Bonne Dolores TO CALL OFFICE BUT NO RETURN CALL YET. MAY 20TH 2019

## 2017-07-14 DIAGNOSIS — G4733 Obstructive sleep apnea (adult) (pediatric): Secondary | ICD-10-CM | POA: Diagnosis not present

## 2017-07-18 ENCOUNTER — Other Ambulatory Visit: Payer: Self-pay | Admitting: Adult Health

## 2017-07-18 DIAGNOSIS — I5032 Chronic diastolic (congestive) heart failure: Secondary | ICD-10-CM

## 2017-07-21 ENCOUNTER — Other Ambulatory Visit (HOSPITAL_COMMUNITY): Payer: Self-pay | Admitting: Student

## 2017-07-23 ENCOUNTER — Other Ambulatory Visit: Payer: Self-pay | Admitting: Internal Medicine

## 2017-07-26 ENCOUNTER — Other Ambulatory Visit: Payer: Self-pay | Admitting: Adult Health

## 2017-07-26 ENCOUNTER — Other Ambulatory Visit: Payer: Self-pay | Admitting: Physician Assistant

## 2017-07-26 MED ORDER — ALBUTEROL SULFATE HFA 108 (90 BASE) MCG/ACT IN AERS: 2.0000 | INHALATION_SPRAY | Freq: Four times a day (QID) | RESPIRATORY_TRACT | 1 refills | Status: DC | PRN

## 2017-08-13 DIAGNOSIS — G4733 Obstructive sleep apnea (adult) (pediatric): Secondary | ICD-10-CM | POA: Diagnosis not present

## 2017-08-19 ENCOUNTER — Other Ambulatory Visit: Payer: Self-pay | Admitting: Adult Health

## 2017-08-19 DIAGNOSIS — I5032 Chronic diastolic (congestive) heart failure: Secondary | ICD-10-CM

## 2017-09-07 ENCOUNTER — Other Ambulatory Visit: Payer: Self-pay | Admitting: Internal Medicine

## 2017-09-07 ENCOUNTER — Other Ambulatory Visit: Payer: Self-pay | Admitting: Physician Assistant

## 2017-09-07 DIAGNOSIS — F32A Depression, unspecified: Secondary | ICD-10-CM

## 2017-09-07 DIAGNOSIS — F329 Major depressive disorder, single episode, unspecified: Secondary | ICD-10-CM

## 2017-09-13 DIAGNOSIS — G4733 Obstructive sleep apnea (adult) (pediatric): Secondary | ICD-10-CM | POA: Diagnosis not present

## 2017-10-14 DIAGNOSIS — G4733 Obstructive sleep apnea (adult) (pediatric): Secondary | ICD-10-CM | POA: Diagnosis not present

## 2017-10-17 ENCOUNTER — Other Ambulatory Visit: Payer: Self-pay | Admitting: Internal Medicine

## 2017-10-21 ENCOUNTER — Other Ambulatory Visit: Payer: Self-pay | Admitting: Internal Medicine

## 2017-10-26 ENCOUNTER — Telehealth: Payer: Self-pay | Admitting: Internal Medicine

## 2017-10-26 MED ORDER — FUROSEMIDE 40 MG PO TABS: ORAL_TABLET | ORAL | 0 refills | Status: DC

## 2017-10-26 NOTE — Telephone Encounter (Signed)
please send rx to [facility] [address]  Generic Lasix 65m takes 2-3 daily

## 2017-10-26 NOTE — Addendum Note (Signed)
Addended by: Vicie Mutters R on: 10/26/2017 10:31 AM   Modules accepted: Orders

## 2017-11-06 ENCOUNTER — Other Ambulatory Visit: Payer: Self-pay | Admitting: Physician Assistant

## 2017-11-06 DIAGNOSIS — N184 Chronic kidney disease, stage 4 (severe): Secondary | ICD-10-CM | POA: Insufficient documentation

## 2017-11-06 NOTE — Progress Notes (Signed)
Complete Physical  Assessment and Plan:  Chronic atrial fibrillation Continue medications, rate controlled, follow up cardio  Secondary hypertension - continue medications, DASH diet, exercise and monitor at home. Call if greater than 130/80.  -     CBC with Differential/Platelet  Other pulmonary embolism without acute cor pulmonale, unspecified chronicity ([place]) Stay on CPAP, continue xarelto  Atrial flutter, unspecified type ([place]) Continue medications, rate controlled, follow up cardio  Pulmonary hypertension due to COPD ([place]) Stay on CPAP, weight loss advised  CHF NYHA class III (symptoms with mildly strenuous activities), acute on chronic, diastolic (HCC) Follow up with cardio, monitor weight daily, weight loss advised  OSA and COPD overlap syndrome ([place]) Stay on CPAP, weight loss advised  Chronic obstructive pulmonary disease, unspecified COPD type ([place]) Continue inhalers  Other specified hypothyroidism Hypothyroidism-check TSH level, continue medications the same, reminded to take on an empty stomach 30-79mns before food.  -     TSH  Uncontrolled type 2 diabetes mellitus with hyperglycemia (HCC) Discussed general issues about diabetes pathophysiology and management., Educational material distributed., Suggested low cholesterol diet., Encouraged aerobic exercise., Discussed foot care., Reminded to get yearly retinal exam. -     COMPLETE METABOLIC PANEL WITH GFR -     Hemoglobin A1c  CKD stage 4 due to type 2 diabetes mellitus ([place] Discussed general issues about diabetes pathophysiology and management., Educational material distributed., Suggested low cholesterol diet., Encouraged aerobic exercise., Discussed foot care., Reminded to get yearly retinal exam. -     COMPLETE METABOLIC PANEL WITH GFR -     Hemoglobin A1c  Depression, major, recurrent, moderate (HCC) - continue medications, stress management techniques discussed, increase water, good sleep hygiene  discussed, increase exercise, and increase veggies.   Hyperlipidemia, unspecified hyperlipidemia type check lipids decrease fatty foods increase activity.  -     Lipid panel  Morbid obesity (BMI 49.6) - follow up 3 months for progress monitoring - increase veggies, decrease carbs - long discussion about weight loss, diet, and exercise  Vitamin D deficiency -     VITAMIN D 25 Hydroxy (Vit-D Deficiency, Fractures)  Anemia, unspecified type -     CBC with Differential/Platelet -     Iron,Total/Total Iron Binding Cap  Anxiety - monitor, continue iron supp with Vitamin C and increase green leafy veggies  Gastroesophageal reflux disease, esophagitis presence not specified Continue PPI/H2 blocker, diet discussed  Noncompliance Doing better  Encounter for general adult medical examination with abnormal findings 1 year- get MGM and colonoscopy, will discuss PAP next year  CKD (chronic kidney disease) stage 4, GFR 15-29 ml/min (HCC) avoid NSAIDS, monitor sugars, will monitor -     COMPLETE METABOLIC PANEL WITH GFR -     Urinalysis, Routine w reflex microscopic -     Microalbumin / creatinine urine ratio  Medication management -     Magnesium  Need for diphtheria-tetanus-pertussis (Tdap) vaccine -     Tdap vaccine greater than or equal to 7yo IM  Family history of colon cancer -     Ambulatory referral to Gastroenterology - OVERDUE, has constipation as well, get on miralax and needs colonoscopy  COPD exacerbation (HCC) -     azithromycin (ZITHROMAX) 250 MG tablet; Take 2 tablets (500 mg) on  Day 1,  followed by 1 tablet (250 mg) once daily on Days 2 through 5. -     predniSONE (DELTASONE) 20 MG tablet; 2 tablets daily for 3 days, 1 tablet daily for 4 days.  Chronic bilateral low  back pain without sciatica -     gabapentin (NEURONTIN) 300 MG capsule; Take 1 capsule (300 mg total) by mouth 3 (three) times daily.  Discussed med's effects and SE's. Screening labs and tests as  requested with regular follow-up as recommended. Over 40 minutes of exam, counseling, chart review, and complex, high level critical decision making was performed this visit.  Future Appointments  Date Time Provider [place]  11/21/2017  3:45 PM Dennie Bible, NP [facility] None  11/08/2018  2:00 PM Vicie Mutters, PA-C [facility] None    HPI  57 y.o. female  presents for a complete physical and follow up for has Depression, major, recurrent, moderate ([place]); OSA and COPD overlap syndrome ([place]); G E R D; CHF NYHA class III (symptoms with mildly strenuous activities), acute on chronic, diastolic ([place]); Pulmonary embolism ([place]); Atrial flutter ([place]); COPD (chronic obstructive pulmonary disease) ([place]); Atrial fibrillation ([place]); Hypothyroid; Hyperlipidemia; HTN (hypertension); Diabetes type 2, uncontrolled ([place]); Anxiety; Anemia; Vitamin D deficiency; Morbid obesity (BMI 49.6); Noncompliance; CKD stage 4 due to type 2 diabetes mellitus ([place]); Pulmonary hypertension due to COPD ([place]); and CKD (chronic kidney disease) stage 4, GFR 15-29 ml/min (HCC) on their problem list..  Her blood pressure has been controlled at home, today their BP is BP: 128/72 She does not workout. She denies chest pain, shortness of breath, dizziness.   She has had sinus congestion, cough with mucus x 1 week. Has been around grandkids that are sick.   She has a history of afib and CHF, she is on amiodarone, cardizem, she is on xarleto, following with Dr. Haroldine Laws. She was on spirolactone but stopped due to constipation.She is off miralax.  She is on lasix 2 a day and zaroxyln 1-2 x a week and 6 potassium a day.  BMI is Body mass index is 49.84 kg/m., she is working on diet and exercise. Wt Readings from Last 3 Encounters:  11/07/17 242 lb 9.6 oz (110 kg)  05/19/17 242 lb 1.9 oz (109.8 kg)  05/18/17 241 lb 9.6 oz (109.6 kg)    She has history of CKD and hypokalemia.  Lab Results  Component Value Date    GFRNONAA 45 (L) 06/06/2017   Lab Results  Component Value Date   CREATININE 1.51 (H) 06/06/2017   BUN 22 (H) 06/06/2017   NA 142 06/06/2017   K 3.9 06/06/2017   CL 105 06/06/2017   CO2 27 06/06/2017   She also has PHTN due to COPD and OSA, she is on CPAP. Needs something to clean her CPAP due to COPD to decrease risk of infection. She is on aerocare.   She is not on cholesterol medication and denies myalgias. Her cholesterol is not at goal. The cholesterol last visit was:   Lab Results  Component Value Date   CHOL 212 (H) 05/03/2017   HDL 46 (L) 05/03/2017   LDLCALC 132 (H) 05/03/2017   TRIG 196 (H) 05/03/2017   CHOLHDL 4.6 05/03/2017    She has been working on diet and exercise for diabetes, she is on bASA, she is on ACE/ARB and denies paresthesia of the feet, polydipsia, polyuria and visual disturbances. Last A1C in the office was:  Lab Results  Component Value Date   HGBA1C 6.1 (H) 05/03/2017   She is on thyroid medication. Her medication was not changed last visit.   Lab Results  Component Value Date   TSH 2.21 05/03/2017   She is on celexa for depression.  Patient is on Vitamin  D supplement.   Lab Results  Component Value Date   VD25OH 10 (L) 06/09/2016      Current Medications:  Current Outpatient Medications on File Prior to Visit  Medication Sig Dispense Refill  . albuterol (PROAIR HFA) 108 (90 Base) MCG/ACT inhaler Inhale 2 puffs into the lungs every 6 (six) hours as needed for wheezing or shortness of breath. 8 g 1  . albuterol (PROVENTIL HFA;VENTOLIN HFA) 108 (90 Base) MCG/ACT inhaler TAKE 2 PUFFS BY MOUTH EVERY 6 HOURS AS NEEDED FOR WHEEZE OR SHORTNESS OF BREATH 48 g 1  . albuterol (PROVENTIL) (2.5 MG/3ML) 0.083% nebulizer solution Take 3 mLs (2.5 mg total) by nebulization every 6 (six) hours as needed for wheezing or shortness of breath. 75 mL 12  . aspirin-acetaminophen-caffeine (EXCEDRIN MIGRAINE) [phone] MG per tablet Take 1 tablet by mouth every 6  (six) hours as needed for headache.    . bisoprolol (ZEBETA) 5 MG tablet Take 1 tablet (5 mg total) by mouth daily. 90 tablet 3  . citalopram (CELEXA) 40 MG tablet TAKE 1 TABLET BY MOUTH EVERY DAY 90 tablet 0  . diltiazem (CARDIZEM CD) 360 MG 24 hr capsule Take 1 capsule (360 mg total) by mouth daily. Needs office visit for further refills [phone] 90 capsule 3  . furosemide (LASIX) 40 MG tablet TAKE 2-3 TABLETs BY MOUTH EVERY DAY 180 tablet 0  . metolazone (ZAROXOLYN) 2.5 MG tablet As needed for increase in swelling (Patient taking differently: 2.5 mg once a week. As needed for increase in swelling) 90 tablet 3  . naproxen sodium (ANAPROX) 220 MG tablet Take 220 mg by mouth 2 (two) times daily as needed (back pain).    . nystatin cream (MYCOSTATIN) Apply 1 application topically 2 (two) times daily. 30 g 1  . OVER THE COUNTER MEDICATION Take 100 mg by mouth at bedtime. Equate sleep medicine    . polyethylene glycol (MIRALAX / GLYCOLAX) packet Take 17 g by mouth daily as needed.    . potassium chloride (K-DUR) 10 MEQ tablet TAKE 2 TABS TWICE DAILY TAKE ADDITIONAL 2 TABS IF TAKE ADDITIONAL LASIX 40 400 tablet 0  . spironolactone (ALDACTONE) 25 MG tablet Take 0.5 tablets (12.5 mg total) by mouth daily. 45 tablet 3  . traZODone (DESYREL) 100 MG tablet TAKE 1/2 TO 1 TABLET BY MOUTH FOR SLEEP 90 tablet 0  . TRELEGY ELLIPTA 100-62.5-25 MCG/INH AEPB INHALE 1 PUFF INTO THE LUNGS ONCE A DAY 180 each 0  . triamcinolone cream (KENALOG) 0.5 % Apply 1 application topically 2 (two) times daily. 80 g 2  . XARELTO 20 MG TABS tablet TAKE 1 TABLET (20 MG TOTAL) DAILY WITH SUPPER. NEEDS OFFICE VISIT FOR FURTHER REFILLS 30 tablet 5   Current Facility-Administered Medications on File Prior to Visit  Medication Dose Route Frequency Provider Last Rate Last Dose  . ipratropium-albuterol (DUONEB) 0.5-2.5 (3) MG/3ML nebulizer solution 3 mL  3 mL Nebulization Once Vicie Mutters, PA-C       Allergies:  Allergies   Allergen Reactions  . Sulfonamide Derivatives Hives and Itching  . Tikosyn [Dofetilide]     Prolonged qt   Medical History:  She has Depression, major, recurrent, moderate ([place]); OSA and COPD overlap syndrome ([place]); G E R D; CHF NYHA class III (symptoms with mildly strenuous activities), acute on chronic, diastolic ([place]); Pulmonary embolism ([place]); Atrial flutter ([place]); COPD (chronic obstructive pulmonary disease) ([place]); Atrial fibrillation ([place]); Hypothyroid; Hyperlipidemia; HTN (hypertension); Diabetes type 2, uncontrolled ([place]); Anxiety; Anemia; Vitamin D  deficiency; Morbid obesity (BMI 49.6); Noncompliance; CKD stage 4 due to type 2 diabetes mellitus ([place]); Pulmonary hypertension due to COPD ([place]); and CKD (chronic kidney disease) stage 4, GFR 15-29 ml/min (HCC) on their problem list.   Health Maintenance:   Immunization History  Administered Date(s) Administered  . Td 02/08/2001    Tetanus: DUE today Pneumovax: will get next time Prevnar 13:  Flu vaccine: getting at work Zostavax:  Pap: 2010 declines at this time MGM: 2006 DEXA: age 1 Colonoscopy: 2003 colon cancer in her mom late 75's passed age 58  Patient Care Team: Unk Pinto, MD as PCP - General (Internal Medicine) Lelon Perla, MD as Consulting Physician (Cardiology)  Surgical History:  She has a past surgical history that includes Tubal ligation (1996); TEE without cardioversion (01/19/2012); Cardioversion (01/19/2012); Cardioversion (01/21/2012); and basal skin can. Family History:  Herfamily history includes Allergies in her father; Asthma in her father; Colon cancer in her mother; Coronary artery disease in her father and mother; Depression in her daughter; Emphysema in her father; Heart attack in her maternal grandfather; Heart disease in her maternal grandmother. Social History:  She reports that she quit smoking about 8 years ago. Her smoking use included cigarettes. She has a 70.00 pack-year smoking  history. She has never used smokeless tobacco. She reports that she does not drink alcohol or use drugs.  Review of Systems: ROS See HPI  Physical Exam: Estimated body mass index is 49.84 kg/m as calculated from the following:   Height as of this encounter: 4' 10.5" (1.486 m).   Weight as of this encounter: 242 lb 9.6 oz (110 kg). BP 128/72   Pulse 60   Temp 97.8 F (36.6 C)   Resp 18   Ht 4' 10.5" (1.486 m)   Wt 242 lb 9.6 oz (110 kg)   SpO2 94%   BMI 49.84 kg/m  General Appearance: Well nourished, obese, in no apparent distress. Eyes: PERRLA, EOMs, conjunctiva no swelling or erythema Sinuses: No Frontal/maxillary tenderness ENT/Mouth: Ext aud canals clear, TMs without erythema, bulging. No erythema, swelling, or exudate on post pharynx.Tonsils not swollen or erythematous. Hearing normal. Crowded mouth Neck: Supple, thyroid normal.  Respiratory: Decreased breath sounds bilateral, with wheezing and decreased breath sounds bilateral lower lobes Cardio: Irreg, irreg,  minimal edema Abdomen: Soft, + BS, obese,  Non tender, no guarding, rebound, hernias, masses. Lymphatics: Non tender without lymphadenopathy.  Musculoskeletal: Full ROM, 5/5 strength, antalgic gait Skin: well healing scar on left nostril, bilateral feet with white fungus, some thickening at toes, no ulcers. Warm, dry without rashes, lesions, ecchymosis.  Neuro: Cranial nerves intact. No cerebellar symptoms.  Psych: Awake and oriented X 3,  Insight and Judgment appropriate.   Vicie Mutters 2:24 PM [facility]

## 2017-11-07 ENCOUNTER — Encounter: Payer: Self-pay | Admitting: Physician Assistant

## 2017-11-07 ENCOUNTER — Ambulatory Visit (INDEPENDENT_AMBULATORY_CARE_PROVIDER_SITE_OTHER): Payer: BLUE CROSS/BLUE SHIELD | Admitting: Physician Assistant

## 2017-11-07 VITALS — BP 128/72 | HR 60 | Temp 97.8°F | Resp 18 | Ht 58.5 in | Wt 242.6 lb

## 2017-11-07 DIAGNOSIS — E1122 Type 2 diabetes mellitus with diabetic chronic kidney disease: Secondary | ICD-10-CM

## 2017-11-07 DIAGNOSIS — F419 Anxiety disorder, unspecified: Secondary | ICD-10-CM

## 2017-11-07 DIAGNOSIS — F331 Major depressive disorder, recurrent, moderate: Secondary | ICD-10-CM

## 2017-11-07 DIAGNOSIS — Z0001 Encounter for general adult medical examination with abnormal findings: Secondary | ICD-10-CM

## 2017-11-07 DIAGNOSIS — G4733 Obstructive sleep apnea (adult) (pediatric): Secondary | ICD-10-CM

## 2017-11-07 DIAGNOSIS — G8929 Other chronic pain: Secondary | ICD-10-CM

## 2017-11-07 DIAGNOSIS — Z1389 Encounter for screening for other disorder: Secondary | ICD-10-CM | POA: Diagnosis not present

## 2017-11-07 DIAGNOSIS — Z79899 Other long term (current) drug therapy: Secondary | ICD-10-CM

## 2017-11-07 DIAGNOSIS — R8271 Bacteriuria: Secondary | ICD-10-CM

## 2017-11-07 DIAGNOSIS — Z13220 Encounter for screening for lipoid disorders: Secondary | ICD-10-CM | POA: Diagnosis not present

## 2017-11-07 DIAGNOSIS — D649 Anemia, unspecified: Secondary | ICD-10-CM

## 2017-11-07 DIAGNOSIS — I159 Secondary hypertension, unspecified: Secondary | ICD-10-CM

## 2017-11-07 DIAGNOSIS — Z23 Encounter for immunization: Secondary | ICD-10-CM

## 2017-11-07 DIAGNOSIS — Z130 Encounter for screening for diseases of the blood and blood-forming organs and certain disorders involving the immune mechanism: Secondary | ICD-10-CM | POA: Diagnosis not present

## 2017-11-07 DIAGNOSIS — N184 Chronic kidney disease, stage 4 (severe): Secondary | ICD-10-CM

## 2017-11-07 DIAGNOSIS — I482 Chronic atrial fibrillation, unspecified: Secondary | ICD-10-CM

## 2017-11-07 DIAGNOSIS — E038 Other specified hypothyroidism: Secondary | ICD-10-CM

## 2017-11-07 DIAGNOSIS — I2699 Other pulmonary embolism without acute cor pulmonale: Secondary | ICD-10-CM

## 2017-11-07 DIAGNOSIS — Z0000 Encounter for general adult medical examination without abnormal findings: Secondary | ICD-10-CM

## 2017-11-07 DIAGNOSIS — E559 Vitamin D deficiency, unspecified: Secondary | ICD-10-CM | POA: Diagnosis not present

## 2017-11-07 DIAGNOSIS — Z131 Encounter for screening for diabetes mellitus: Secondary | ICD-10-CM

## 2017-11-07 DIAGNOSIS — Z1329 Encounter for screening for other suspected endocrine disorder: Secondary | ICD-10-CM

## 2017-11-07 DIAGNOSIS — I5033 Acute on chronic diastolic (congestive) heart failure: Secondary | ICD-10-CM

## 2017-11-07 DIAGNOSIS — Z800 Family history of malignant neoplasm of digestive organs: Secondary | ICD-10-CM

## 2017-11-07 DIAGNOSIS — E785 Hyperlipidemia, unspecified: Secondary | ICD-10-CM

## 2017-11-07 DIAGNOSIS — E1165 Type 2 diabetes mellitus with hyperglycemia: Secondary | ICD-10-CM

## 2017-11-07 DIAGNOSIS — Z91199 Patient's noncompliance with other medical treatment and regimen due to unspecified reason: Secondary | ICD-10-CM

## 2017-11-07 DIAGNOSIS — J449 Chronic obstructive pulmonary disease, unspecified: Secondary | ICD-10-CM

## 2017-11-07 DIAGNOSIS — I4892 Unspecified atrial flutter: Secondary | ICD-10-CM

## 2017-11-07 DIAGNOSIS — M545 Low back pain, unspecified: Secondary | ICD-10-CM

## 2017-11-07 DIAGNOSIS — Z9119 Patient's noncompliance with other medical treatment and regimen: Secondary | ICD-10-CM

## 2017-11-07 DIAGNOSIS — J441 Chronic obstructive pulmonary disease with (acute) exacerbation: Secondary | ICD-10-CM

## 2017-11-07 DIAGNOSIS — K219 Gastro-esophageal reflux disease without esophagitis: Secondary | ICD-10-CM

## 2017-11-07 DIAGNOSIS — I2723 Pulmonary hypertension due to lung diseases and hypoxia: Secondary | ICD-10-CM

## 2017-11-07 MED ORDER — PREDNISONE 20 MG PO TABS: ORAL_TABLET | ORAL | 0 refills | Status: DC

## 2017-11-07 MED ORDER — GABAPENTIN 300 MG PO CAPS: 300.0000 mg | ORAL_CAPSULE | Freq: Three times a day (TID) | ORAL | 2 refills | Status: DC

## 2017-11-07 MED ORDER — AZITHROMYCIN 250 MG PO TABS: ORAL_TABLET | ORAL | 1 refills | Status: AC

## 2017-11-07 NOTE — Patient Instructions (Addendum)
Ask [facility] about tubing being covered every 3-6 months Ask if can get note to get cleaner done.   Use a dropper or use a cap to put peroxide, olive oil,mineral oil or canola oil in the effected ear- 2-3 times a week. Let it soak for 20-30 min then you can take a shower or use a baby bulb with warm water to wash out the ear wax.  Do not use Qtips   HOW TO SCHEDULE A MAMMOGRAM- DO THIS  [facility]  7 a.m.-6:30 p.m., Monday 7 a.m.-5 p.m., Tuesday-Friday Schedule an appointment by calling [phone].  Can take the gabapentin for nerve pain/back pain. It can make you sleepy so we suggest trying it at night first and please plan to not drive or do anything strenuous. Also please do not take this medication with alcohol.  Start out 1 pill at night before bed, can increase to 2 pills at night before bed. Please call the office if you have any side effects.   Can take 3 pills a day however you would like  Some examples: - 1 breakfast, lunch, bedtime. - 1 at breakfast, 2 at bed time  Gabapentin capsules or tablets What is this medicine? GABAPENTIN (GA ba pen tin) is used to control partial seizures in adults with epilepsy. It is also used to treat certain types of nerve pain. This medicine may be used for other purposes; ask your health care provider or pharmacist if you have questions. COMMON BRAND NAME(S): Active-PAC with Gabapentin, Gabarone, Neurontin What should I tell my health care provider before I take this medicine? They need to know if you have any of these conditions: -kidney disease -suicidal thoughts, plans, or attempt; a previous suicide attempt by you or a family member -an unusual or allergic reaction to gabapentin, other medicines, foods, dyes, or preservatives -pregnant or trying to get pregnant -breast-feeding How should I use this medicine? Take this medicine by mouth with a glass of water. Follow the directions on the prescription  label. You can take it with or without food. If it upsets your stomach, take it with food.Take your medicine at regular intervals. Do not take it more often than directed. Do not stop taking except on your doctor's advice. If you are directed to break the 600 or 800 mg tablets in half as part of your dose, the extra half tablet should be used for the next dose. If you have not used the extra half tablet within 28 days, it should be thrown away. A special MedGuide will be given to you by the pharmacist with each prescription and refill. Be sure to read this information carefully each time. Talk to your pediatrician regarding the use of this medicine in children. Special care may be needed. Overdosage: If you think you have taken too much of this medicine contact a poison control center or emergency room at once. NOTE: This medicine is only for you. Do not share this medicine with others. What if I miss a dose? If you miss a dose, take it as soon as you can. If it is almost time for your next dose, take only that dose. Do not take double or extra doses. What may interact with this medicine? Do not take this medicine with any of the following medications: -other gabapentin products This medicine may also interact with the following medications: -alcohol -antacids -antihistamines for allergy, cough and cold -certain medicines for anxiety or sleep -certain medicines for depression  or psychotic disturbances -homatropine; hydrocodone -naproxen -narcotic medicines (opiates) for pain -phenothiazines like chlorpromazine, mesoridazine, prochlorperazine, thioridazine This list may not describe all possible interactions. Give your health care provider a list of all the medicines, herbs, non-prescription drugs, or dietary supplements you use. Also tell them if you smoke, drink alcohol, or use illegal drugs. Some items may interact with your medicine. What should I watch for while using this medicine? Visit  your doctor or health care professional for regular checks on your progress. You may want to keep a record at home of how you feel your condition is responding to treatment. You may want to share this information with your doctor or health care professional at each visit. You should contact your doctor or health care professional if your seizures get worse or if you have any new types of seizures. Do not stop taking this medicine or any of your seizure medicines unless instructed by your doctor or health care professional. Stopping your medicine suddenly can increase your seizures or their severity. Wear a medical identification bracelet or chain if you are taking this medicine for seizures, and carry a card that lists all your medications. You may get drowsy, dizzy, or have blurred vision. Do not drive, use machinery, or do anything that needs mental alertness until you know how this medicine affects you. To reduce dizzy or fainting spells, do not sit or stand up quickly, especially if you are an older patient. Alcohol can increase drowsiness and dizziness. Avoid alcoholic drinks. Your mouth may get dry. Chewing sugarless gum or sucking hard candy, and drinking plenty of water will help. The use of this medicine may increase the chance of suicidal thoughts or actions. Pay special attention to how you are responding while on this medicine. Any worsening of mood, or thoughts of suicide or dying should be reported to your health care professional right away. Women who become pregnant while using this medicine may enroll in the [place] Pregnancy Registry by calling [phone]. This registry collects information about the safety of antiepileptic drug use during pregnancy. What side effects may I notice from receiving this medicine? Side effects that you should report to your doctor or health care professional as soon as possible: -allergic reactions like skin rash, itching or hives,  swelling of the face, lips, or tongue -worsening of mood, thoughts or actions of suicide or dying Side effects that usually do not require medical attention (report to your doctor or health care professional if they continue or are bothersome): -constipation -difficulty walking or controlling muscle movements -dizziness -nausea -slurred speech -tiredness -tremors -weight gain This list may not describe all possible side effects. Call your doctor for medical advice about side effects. You may report side effects to FDA at 1-800-FDA-1088. Where should I keep my medicine? Keep out of reach of children. This medicine may cause accidental overdose and death if it taken by other adults, children, or pets. Mix any unused medicine with a substance like cat litter or coffee grounds. Then throw the medicine away in a sealed container like a sealed bag or a coffee can with a lid. Do not use the medicine after the expiration date. Store at room temperature between 15 and 30 degrees C (59 and 86 degrees F). NOTE: This sheet is a summary. It may not cover all possible information. If you have questions about this medicine, talk to your doctor, pharmacist, or health care provider.  2018 Elsevier/Gold Standard (2013-03-23 15:26:50)  GET ON  [place] Constipation, Adult Constipation is when a person has fewer bowel movements in a week than normal, has difficulty having a bowel movement, or has stools that are dry, hard, or larger than normal. Constipation may be caused by an underlying condition. It may become worse with age if a person takes certain medicines and does not take in enough fluids. Follow these instructions at home: Eating and drinking   Eat foods that have a lot of fiber, such as fresh fruits and vegetables, whole grains, and beans.  Limit foods that are high in fat, low in fiber, or overly processed, such as french fries, hamburgers, cookies, candies, and soda.  Drink enough fluid to keep  your urine clear or pale yellow. General instructions  Exercise regularly or as told by your health care provider.  Go to the restroom when you have the urge to go. Do not hold it in.  Take over-the-counter and prescription medicines only as told by your health care provider. These include any fiber supplements.  Practice pelvic floor retraining exercises, such as deep breathing while relaxing the lower abdomen and pelvic floor relaxation during bowel movements.  Watch your condition for any changes.  Keep all follow-up visits as told by your health care provider. This is important. Contact a health care provider if:  You have pain that gets worse.  You have a fever.  You do not have a bowel movement after 4 days.  You vomit.  You are not hungry.  You lose weight.  You are bleeding from the anus.  You have thin, pencil-like stools. Get help right away if:  You have a fever and your symptoms suddenly get worse.  You leak stool or have blood in your stool.  Your abdomen is bloated.  You have severe pain in your abdomen.  You feel dizzy or you faint. This information is not intended to replace advice given to you by your health care provider. Make sure you discuss any questions you have with your health care provider. Document Released: 10/24/2003 Document Revised: 08/15/2015 Document Reviewed: 07/16/2015 Elsevier Interactive Patient Education  [employer].     When it comes to diets, agreement about the perfect plan isn't easy to find, even among the experts. Experts at the [employer] developed an idea known as the Healthy Eating Plate. Just imagine a plate divided into logical, healthy portions.  The emphasis is on diet quality:  Load up on vegetables and fruits - one-half of your plate: Aim for color and variety, and remember that potatoes don't count.  Go for whole grains - one-quarter of your plate: Whole wheat, barley, wheat  berries, quinoa, oats, brown rice, and foods made with them. If you want pasta, go with whole wheat pasta.  Protein power - one-quarter of your plate: Fish, chicken, beans, and nuts are all healthy, versatile protein sources. Limit red meat.  The diet, however, does go beyond the plate, offering a few other suggestions.  Use healthy plant oils, such as olive, canola, soy, corn, sunflower and peanut. Check the labels, and avoid partially hydrogenated oil, which have unhealthy trans fats.  If you're thirsty, drink water. Coffee and tea are good in moderation, but skip sugary drinks and limit milk and dairy products to one or two daily servings.  The type of carbohydrate in the diet is more important than the amount. Some sources of carbohydrates, such as vegetables, fruits, whole grains, and beans-are healthier than others.  Finally, stay  active.

## 2017-11-08 LAB — URINALYSIS, ROUTINE W REFLEX MICROSCOPIC
Bilirubin Urine: NEGATIVE
Glucose, UA: NEGATIVE
Hgb urine dipstick: NEGATIVE
Nitrite: NEGATIVE
Specific Gravity, Urine: 1.024 (ref 1.001–1.03)
pH: 6 (ref 5.0–8.0)

## 2017-11-08 LAB — COMPLETE METABOLIC PANEL WITH GFR
AG Ratio: 1.4 (calc) (ref 1.0–2.5)
ALT: 28 U/L (ref 6–29)
AST: 21 U/L (ref 10–35)
Albumin: 3.8 g/dL (ref 3.6–5.1)
Alkaline phosphatase (APISO): 86 U/L (ref 33–130)
BUN/Creatinine Ratio: 13 (calc) (ref 6–22)
BUN: 20 mg/dL (ref 7–25)
CO2: 36 mmol/L — ABNORMAL HIGH (ref 20–32)
Calcium: 9.1 mg/dL (ref 8.6–10.4)
Chloride: 99 mmol/L (ref 98–110)
Creat: 1.55 mg/dL — ABNORMAL HIGH (ref 0.50–1.05)
GFR, Est African American: 42 mL/min/{1.73_m2} — ABNORMAL LOW (ref >=60)
GFR, Est Non African American: 37 mL/min/{1.73_m2} — ABNORMAL LOW (ref >=60)
Globulin: 2.7 g/dL (calc) (ref 1.9–3.7)
Glucose, Bld: 88 mg/dL (ref 65–99)
Potassium: 3.1 mmol/L — ABNORMAL LOW (ref 3.5–5.3)
Sodium: 144 mmol/L (ref 135–146)
Total Bilirubin: 0.4 mg/dL (ref 0.2–1.2)
Total Protein: 6.5 g/dL (ref 6.1–8.1)

## 2017-11-08 LAB — CBC WITH DIFFERENTIAL/PLATELET
Basophils Absolute: 22 cells/uL (ref 0–200)
Basophils Relative: 0.4 %
Eosinophils Absolute: 39 cells/uL (ref 15–500)
Eosinophils Relative: 0.7 %
HCT: 38.2 % (ref 35.0–45.0)
Hemoglobin: 13.1 g/dL (ref 11.7–15.5)
Lymphs Abs: 1634 cells/uL (ref 850–3900)
MCH: 29.6 pg (ref 27.0–33.0)
MCHC: 34.3 g/dL (ref 32.0–36.0)
MCV: 86.2 fL (ref 80.0–100.0)
MPV: 11 fL (ref 7.5–12.5)
Monocytes Relative: 9.9 %
Neutro Abs: 3262 cells/uL (ref 1500–7800)
Neutrophils Relative %: 59.3 %
Platelets: 224 10*3/uL (ref 140–400)
RBC: 4.43 10*6/uL (ref 3.80–5.10)
RDW: 13.9 % (ref 11.0–15.0)
Total Lymphocyte: 29.7 %
WBC mixed population: 545 cells/uL (ref 200–950)
WBC: 5.5 10*3/uL (ref 3.8–10.8)

## 2017-11-08 LAB — LIPID PANEL
Cholesterol: 198 mg/dL (ref <200)
HDL: 37 mg/dL — ABNORMAL LOW (ref >50)
LDL Cholesterol (Calc): 127 mg/dL (calc) — ABNORMAL HIGH
Non-HDL Cholesterol (Calc): 161 mg/dL (calc) — ABNORMAL HIGH (ref <130)
Total CHOL/HDL Ratio: 5.4 (calc) — ABNORMAL HIGH (ref <5.0)
Triglycerides: 199 mg/dL — ABNORMAL HIGH (ref <150)

## 2017-11-08 LAB — MAGNESIUM: Magnesium: 1.8 mg/dL (ref 1.5–2.5)

## 2017-11-08 LAB — VITAMIN D 25 HYDROXY (VIT D DEFICIENCY, FRACTURES): Vit D, 25-Hydroxy: 33 ng/mL (ref 30–100)

## 2017-11-08 LAB — IRON, TOTAL/TOTAL IRON BINDING CAP
%SAT: 26 % (calc) (ref 16–45)
Iron: 82 ug/dL (ref 45–160)
TIBC: 315 mcg/dL (calc) (ref 250–450)

## 2017-11-08 LAB — MICROALBUMIN / CREATININE URINE RATIO
Creatinine, Urine: 286 mg/dL — ABNORMAL HIGH (ref 20–275)
Microalb Creat Ratio: 15 mcg/mg creat (ref <30)
Microalb, Ur: 4.4 mg/dL

## 2017-11-08 LAB — HEMOGLOBIN A1C
Hgb A1c MFr Bld: 6.5 % of total Hgb — ABNORMAL HIGH (ref <5.7)
Mean Plasma Glucose: 140 (calc)
eAG (mmol/L): 7.7 (calc)

## 2017-11-08 LAB — TSH: TSH: 2.66 mIU/L (ref 0.40–4.50)

## 2017-11-09 ENCOUNTER — Other Ambulatory Visit: Payer: Self-pay | Admitting: Internal Medicine

## 2017-11-09 ENCOUNTER — Other Ambulatory Visit: Payer: Self-pay | Admitting: Physician Assistant

## 2017-11-09 ENCOUNTER — Other Ambulatory Visit: Payer: Self-pay

## 2017-11-09 DIAGNOSIS — R8271 Bacteriuria: Secondary | ICD-10-CM

## 2017-11-09 NOTE — Addendum Note (Signed)
Addended by: Eulis Canner on: 11/09/2017 02:53 PM   Modules accepted: Orders

## 2017-11-10 LAB — URINE CULTURE
MICRO NUMBER:: 91185358
Result:: NO GROWTH
SPECIMEN QUALITY:: ADEQUATE

## 2017-11-13 DIAGNOSIS — G4733 Obstructive sleep apnea (adult) (pediatric): Secondary | ICD-10-CM | POA: Diagnosis not present

## 2017-11-17 NOTE — Progress Notes (Signed)
[facility]  PATIENT: [name] DOB: October 08, 1960   REASON FOR VISIT: Follow-up for obstructive sleep apnea  CPAP compliance check HISTORY FROM: Patient    HISTORY OF PRESENT ILLNESS:UPDATE 10/14/2019CM [name], 57 year old female returns for follow-up with a history of obstructive sleep apnea here for CPAP compliance.  Data dated 10/22/2017-11/20/2017 shows compliance greater than 4 hours at 100% for 30 days.  Average usage 9 hours 20 minutes.  Set pressure 12 cm EPR level 3 leak 95th percentile 2.9.  AHI 0.5.  ESS 6.  She returns for reevaluation    UPDATE 4/10/2019CM [name], 57 year old female returns for follow-up.  Obstructive sleep apnea and is here for CPAP compliance.  Data dated 04/17/2017 2 05/16/2017 shows compliance greater than 4 hours at 100%.  Average usage 9 hours 16 minutes.  Set pressure of 12 cm EPR 3 AHI 0.3 ESS 5.  She is having no problems with her machine.  She returns for reevaluation  UPDATE 10/08/2018CM [name], 57 year old female returns for follow-up. She has obstructive sleep apnea. Sleep study found patient has severe obstructive sleep apnea. This is her first CPAP compliance check. Data dated 10/16/2016- 11/14/2016 shows compliance greater than 4 hours for 23 days for 77%. Average usage 9 hours 33 minutes. Set pressure 12 cm. EPR 3. AHI 0.7. Minimal leak ESS 5 returns for reevaluation    08/04/16 CDDonna K Medina is a 57 y.o. female , seen here as in a referral from Dr. Idell Pickles PA Vicie Mutters.  This is my first encounter with [name] on 08/04/2016. The patient reports that she was diagnosed with sleep apnea at an AHI of 77, at the time at [address]. This sleep evaluation was initiated after her daughter had witnessed the patient having apnea during a hospitalization, she stayed at her bedside. The patient has an extensive and significant medical history I would like to introduce  her past and current medical history as it makes the evaluation of sleep apnea urgent. She has chronic diastolic heart failure with water retention, shortness of breath, and orthopnea. She has chronic atrial fibrillation but is rate controlled, she has secondary hypertension she has chronic obstructive pulmonary disease and requires nebulizers and breathing treatments. She is considered super obese at a BMI of 50. CKD grade 4 with a GFR of 20.   [name] has presented with fatigue and weight gain and her primary care physician once urgently to put her back on CPAP. Her cardiologist is Dr. Britta Mccreedy long at the heart failure clinic, who is equally interested in getting her back on positive airway pressure especially since COPD overlap is also present. I would like to add that the patient experienced pulmonary embolism during an attack of atrial flutter and that she has hypothyroidism as well, anemia and poorly controlled diabetes type 2. Her review of symptoms is positive for weight gain and fatigue palpitations leg swelling hearing loss and tinnitus constipation joint pain aching muscles, weakness, numbness, confusion, dizziness, sleepiness, decreased energy and is interested activities.   Sleep habits are as follows: The patient takes 171mg of trazodone to allow her to sleep, otherwise she states her mind is racing and she cannot find a way to sleep. This is around 8 PM when she takes the medication, bedtime is around 9:30 PM and the medication has started to work. She stays asleep for about 2-3 hours -her sleep is fragmented by bathroom breaks up to 4 at night. She  looks much better in a chair or with her back of the bed elevated that in bed. And her sleep usually lasts longer and is less fragmented. But her legs swell more. She does report vivid dreams. She does not report palpitations, diaphoresis, nausea or dizziness at night. When she sleeps in bed she sleeps on her side, on multiple pillows for  head support. Her alarm rings at 6:30 and she usually is ready to leave the house by 7.15 AM.     REVIEW OF SYSTEMS: Full 14 system review of systems performed and notable only for those listed, all others are neg:  Constitutional: neg  Cardiovascular: neg Ear/Nose/Throat: neg  Skin: neg Eyes: neg Respiratory: neg Gastroitestinal: neg  Hematology/Lymphatic: Easy bruising Endocrine: neg Musculoskeletal:neg Allergy/Immunology: neg Neurological: neg Psychiatric: Anxiety Sleep : Obstructive sleep apnea with CPAP   ALLERGIES: Allergies  Allergen Reactions  . Sulfonamide Derivatives Hives and Itching  . Tikosyn [Dofetilide]     Prolonged qt    HOME MEDICATIONS: Outpatient Medications Prior to Visit  Medication Sig Dispense Refill  . albuterol (PROAIR HFA) 108 (90 Base) MCG/ACT inhaler Inhale 2 puffs into the lungs every 6 (six) hours as needed for wheezing or shortness of breath. 8 g 1  . albuterol (PROVENTIL HFA;VENTOLIN HFA) 108 (90 Base) MCG/ACT inhaler TAKE 2 PUFFS BY MOUTH EVERY 6 HOURS AS NEEDED FOR WHEEZE OR SHORTNESS OF BREATH 48 g 1  . albuterol (PROVENTIL) (2.5 MG/3ML) 0.083% nebulizer solution Take 3 mLs (2.5 mg total) by nebulization every 6 (six) hours as needed for wheezing or shortness of breath. 75 mL 12  . aspirin-acetaminophen-caffeine (EXCEDRIN MIGRAINE) [phone] MG per tablet Take 1 tablet by mouth every 6 (six) hours as needed for headache.    . bisoprolol (ZEBETA) 5 MG tablet Take 1 tablet (5 mg total) by mouth daily. 90 tablet 3  . citalopram (CELEXA) 40 MG tablet TAKE 1 TABLET BY MOUTH EVERY DAY 90 tablet 0  . diltiazem (CARDIZEM CD) 360 MG 24 hr capsule Take 1 capsule (360 mg total) by mouth daily. Needs office visit for further refills [phone] 90 capsule 3  . furosemide (LASIX) 40 MG tablet TAKE 2-3 TABLETs BY MOUTH EVERY DAY 180 tablet 0  . gabapentin (NEURONTIN) 300 MG capsule Take 1 capsule (300 mg total) by mouth 3 (three) times daily. 90 capsule  2  . metolazone (ZAROXOLYN) 2.5 MG tablet As needed for increase in swelling (Patient taking differently: 2.5 mg once a week. As needed for increase in swelling) 90 tablet 3  . naproxen sodium (ANAPROX) 220 MG tablet Take 220 mg by mouth 2 (two) times daily as needed (back pain).    . nystatin cream (MYCOSTATIN) Apply 1 application topically 2 (two) times daily. 30 g 1  . OVER THE COUNTER MEDICATION Take 100 mg by mouth at bedtime. Equate sleep medicine    . polyethylene glycol (MIRALAX / GLYCOLAX) packet Take 17 g by mouth daily as needed.    . potassium chloride (K-DUR) 10 MEQ tablet TAKE 2 TABS TWICE DAILY TAKE ADDITIONAL 2 TABS IF TAKE ADDITIONAL LASIX 40 400 tablet 0  . potassium chloride (K-DUR) 10 MEQ tablet TAKE 2 TABS TWICE DAILY TAKE ADDITIONAL 2 TABS IF TAKE ADDITIONAL LASIX 40 400 tablet 1  . spironolactone (ALDACTONE) 25 MG tablet Take 0.5 tablets (12.5 mg total) by mouth daily. 45 tablet 3  . traZODone (DESYREL) 100 MG tablet TAKE 1/2 TO 1 TABLET BY MOUTH FOR SLEEP 90 tablet 0  . TRELEGY  ELLIPTA 100-62.5-25 MCG/INH AEPB INHALE 1 PUFF INTO THE LUNGS ONCE A DAY 180 each 0  . triamcinolone cream (KENALOG) 0.5 % Apply 1 application topically 2 (two) times daily. 80 g 2  . XARELTO 20 MG TABS tablet TAKE 1 TABLET (20 MG TOTAL) DAILY WITH SUPPER. NEEDS OFFICE VISIT FOR FURTHER REFILLS 30 tablet 5  . predniSONE (DELTASONE) 20 MG tablet 2 tablets daily for 3 days, 1 tablet daily for 4 days. 10 tablet 0   Facility-Administered Medications Prior to Visit  Medication Dose Route Frequency Provider Last Rate Last Dose  . ipratropium-albuterol (DUONEB) 0.5-2.5 (3) MG/3ML nebulizer solution 3 mL  3 mL Nebulization Once Vicie Mutters, PA-C        PAST MEDICAL HISTORY: Past Medical History:  Diagnosis Date  . Anemia   . Anxiety   . Atrial flutter ([place])   . Borderline diabetes   . Chronic diastolic heart failure ([place])   . COPD (chronic obstructive pulmonary disease) ([place])   . Depression     . GERD (gastroesophageal reflux disease)   . HTN (hypertension)   . Hyperlipidemia   . Obstructive sleep apnea   . Persistent atrial fibrillation    Myoview 4/09: No ischemia;  echo 4/09: EF 60-65%;   Flecainide, beta blocker, Coumadin;    Unable to take Tikosyn 2/2 prolonged QT  . Pulmonary embolism (HCC)    Right lower lobe diagnosed by CT 6/12  . Type II or unspecified type diabetes mellitus without mention of complication, not stated as uncontrolled     PAST SURGICAL HISTORY: Past Surgical History:  Procedure Laterality Date  . basal skin can    . CARDIOVERSION  01/19/2012   Procedure: CARDIOVERSION;  Surgeon: Jolaine Artist, MD;  Location: [facility] ENDOSCOPY;  Service: Cardiovascular;  Laterality: N/A;  . CARDIOVERSION  01/21/2012   Procedure: CARDIOVERSION;  Surgeon: Carlena Bjornstad, MD;  Location: [facility];  Service: Cardiovascular;  Laterality: N/A;  Rm [id number]  . TEE WITHOUT CARDIOVERSION  01/19/2012   Procedure: TRANSESOPHAGEAL ECHOCARDIOGRAM (TEE);  Surgeon: Jolaine Artist, MD;  Location: [facility] ENDOSCOPY;  Service: Cardiovascular;  Laterality: N/A;  . TUBAL LIGATION  1996    FAMILY HISTORY: Family History  Problem Relation Age of Onset  . Colon cancer Mother   . Coronary artery disease Mother   . Coronary artery disease Father   . Asthma Father   . Emphysema Father   . Allergies Father   . Heart attack Maternal Grandfather   . Heart disease Maternal Grandmother   . Depression Daughter     SOCIAL HISTORY: Social History   Socioeconomic History  . Marital status: Legally Separated    Spouse name: n/a  . Number of children: 4  . Years of education: 36  . Highest education level: Not on file  Occupational History  . Occupation: clerical    Comment: Manufacturing engineer  Social Needs  . Financial resource strain: Not on file  . Food insecurity:    Worry: Not on file    Inability: Not on file  . Transportation needs:    Medical: Not on file    Non-medical:  Not on file  Tobacco Use  . Smoking status: Former Smoker    Packs/day: 2.00    Years: 35.00    Pack years: 70.00    Types: Cigarettes    Last attempt to quit: 09/08/2009    Years since quitting: 8.2  . Smokeless tobacco: Never Used  Substance and Sexual Activity  .  Alcohol use: No  . Drug use: No  . Sexual activity: Never    Birth control/protection: None  Lifestyle  . Physical activity:    Days per week: Not on file    Minutes per session: Not on file  . Stress: Not on file  Relationships  . Social connections:    Talks on phone: Not on file    Gets together: Not on file    Attends religious service: Not on file    Active member of club or organization: Not on file    Attends meetings of clubs or organizations: Not on file    Relationship status: Not on file  . Intimate partner violence:    Fear of current or ex partner: Not on file    Emotionally abused: Not on file    Physically abused: Not on file    Forced sexual activity: Not on file  Other Topics Concern  . Not on file  Social History Narrative   Married but currently separated with 4 children, 2 of whom still live with her.  Youngest son is a Equities trader in [employer], to graduate in 07/2012.     PHYSICAL EXAM  Vitals:   11/21/17 1527  BP: 104/68  Pulse: 78  Weight: 245 lb 3.2 oz (111.2 kg)  Height: 4' 10.5" (1.486 m)   Body mass index is 50.37 kg/m.  Generalized: Well developed,morbidly obese female in no acute distress  Head: normocephalic and atraumatic,. Oropharynx benign [place] 5 Neck: Supple, circumference 16.5 Lungs clear Musculoskeletal: No deformity  Skin no rash or edema Neurological examination   Mentation: Alert oriented to time, place, history taking. Attention span and concentration appropriate. Recent and remote memory intact.  Follows all commands speech and language fluent.   Cranial nerve II-XII: Pupils were equal round reactive to light extraocular movements were full, visual field were  full on confrontational test. Facial sensation and strength were normal. hearing was intact to finger rubbing bilaterally. Uvula tongue midline. head turning and shoulder shrug were normal and symmetric.Tongue protrusion into cheek strength was normal. Motor: normal bulk and tone, full strength in the BUE, BLE, fine finger movements normal, no pronator drift. No focal weakness Sensory: normal and symmetric to light touch, in the upper and lower extremities  Coordination: finger-nose-finger, heel-to-shin bilaterally, no dysmetria Gait and Station: Rising up from seated position without assistance, normal stance,  moderate stride, good arm swing, smooth turning, able to perform tiptoe, and heel walking without difficulty. Tandem gait is unsteady. No assistive device  DIAGNOSTIC DATA (LABS, IMAGING, TESTING) - I reviewed patient records, labs, notes, testing and imaging myself where available.  Lab Results  Component Value Date   WBC 5.5 11/07/2017   HGB 13.1 11/07/2017   HCT 38.2 11/07/2017   MCV 86.2 11/07/2017   PLT 224 11/07/2017      Component Value Date/Time   NA 144 11/07/2017 1521   K 3.1 (L) 11/07/2017 1521   CL 99 11/07/2017 1521   CO2 36 (H) 11/07/2017 1521   GLUCOSE 88 11/07/2017 1521   BUN 20 11/07/2017 1521   CREATININE 1.55 (H) 11/07/2017 1521   CALCIUM 9.1 11/07/2017 1521   PROT 6.5 11/07/2017 1521   ALBUMIN 4.1 09/21/2016 1728   AST 21 11/07/2017 1521   ALT 28 11/07/2017 1521   ALKPHOS 85 09/21/2016 1728   BILITOT 0.4 11/07/2017 1521   GFRNONAA 37 (L) 11/07/2017 1521   GFRAA 42 (L) 11/07/2017 1521   Lab Results  Component Value Date  CHOL 198 11/07/2017   HDL 37 (L) 11/07/2017   LDLCALC 127 (H) 11/07/2017   TRIG 199 (H) 11/07/2017   CHOLHDL 5.4 (H) 11/07/2017   Lab Results  Component Value Date   HGBA1C 6.5 (H) 11/07/2017   Lab Results  Component Value Date   VITAMINB12 501 06/09/2016   Lab Results  Component Value Date   TSH 2.66 11/07/2017       ASSESSMENT AND PLAN  57 y.o. year old female  has a past medical history of Anemia; Anxiety; Atrial flutter ([place]); Borderline diabetes; Chronic diastolic heart failure (HCC); COPD (chronic obstructive pulmonary disease) ([place]); Depression; GERD (gastroesophageal reflux disease); HTN (hypertension); Hyperlipidemia; and severe Obstructive sleep apnea; Persistent atrial fibrillation ([place]);  here to follow-up CPAP compliance . Data dated 10/22/2017-11/20/2017 shows compliance greater than 4 hours at 100% for 30 days.  Average usage 9 hours 20 minutes.  Set pressure 12 cm EPR level 3 leak 95th percentile 2.9.  AHI 0.5.  ESS 6.   CPAP compliance is 100%  Continue same settings Follow-up  Yearly We will order supplies I spent 62mn  in total face to face time with the patient more than 50% of which was spent counseling and coordination of care, reviewing test results reviewing medications and discussing and reviewing the diagnosis of obstructive sleep apnea reviewing the compliance report NDennie Bible [facility] APRN  [facility] [address] SMexican Colony[place]aken[place] [phone]

## 2017-11-20 ENCOUNTER — Encounter: Payer: Self-pay | Admitting: Nurse Practitioner

## 2017-11-21 ENCOUNTER — Encounter: Payer: Self-pay | Admitting: Nurse Practitioner

## 2017-11-21 ENCOUNTER — Ambulatory Visit: Payer: BLUE CROSS/BLUE SHIELD | Admitting: Nurse Practitioner

## 2017-11-21 VITALS — BP 104/68 | HR 78 | Ht 58.5 in | Wt 245.2 lb

## 2017-11-21 DIAGNOSIS — Z9989 Dependence on other enabling machines and devices: Secondary | ICD-10-CM

## 2017-11-21 DIAGNOSIS — G4733 Obstructive sleep apnea (adult) (pediatric): Secondary | ICD-10-CM | POA: Diagnosis not present

## 2017-11-21 NOTE — Patient Instructions (Signed)
CPAP compliance is 100%  Continue same settings Follow-up  Yearly We will order supplies

## 2017-11-30 ENCOUNTER — Other Ambulatory Visit: Payer: Self-pay | Admitting: Physician Assistant

## 2017-11-30 DIAGNOSIS — M545 Low back pain, unspecified: Secondary | ICD-10-CM

## 2017-11-30 DIAGNOSIS — G8929 Other chronic pain: Secondary | ICD-10-CM

## 2017-12-14 DIAGNOSIS — G4733 Obstructive sleep apnea (adult) (pediatric): Secondary | ICD-10-CM | POA: Diagnosis not present

## 2017-12-15 DIAGNOSIS — Z800 Family history of malignant neoplasm of digestive organs: Secondary | ICD-10-CM | POA: Diagnosis not present

## 2017-12-15 DIAGNOSIS — Z1211 Encounter for screening for malignant neoplasm of colon: Secondary | ICD-10-CM | POA: Diagnosis not present

## 2017-12-15 DIAGNOSIS — Z8371 Family history of colonic polyps: Secondary | ICD-10-CM | POA: Diagnosis not present

## 2017-12-16 ENCOUNTER — Other Ambulatory Visit: Payer: Self-pay | Admitting: Gastroenterology

## 2017-12-30 ENCOUNTER — Telehealth (HOSPITAL_COMMUNITY): Payer: Self-pay

## 2017-12-30 NOTE — Telephone Encounter (Signed)
medical clearance for colonoscopy faxed 12/30/17 to [place] [facility]

## 2018-01-07 ENCOUNTER — Other Ambulatory Visit: Payer: Self-pay | Admitting: Physician Assistant

## 2018-01-12 ENCOUNTER — Ambulatory Visit (HOSPITAL_COMMUNITY)
Admission: RE | Admit: 2018-01-12 | Payer: BLUE CROSS/BLUE SHIELD | Source: Ambulatory Visit | Admitting: Gastroenterology

## 2018-01-12 ENCOUNTER — Encounter (HOSPITAL_COMMUNITY): Admission: RE | Payer: Self-pay | Source: Ambulatory Visit

## 2018-01-12 SURGERY — ESOPHAGOGASTRODUODENOSCOPY (EGD) WITH PROPOFOL
Anesthesia: Monitor Anesthesia Care

## 2018-01-13 DIAGNOSIS — G4733 Obstructive sleep apnea (adult) (pediatric): Secondary | ICD-10-CM | POA: Diagnosis not present

## 2018-01-16 DIAGNOSIS — G4733 Obstructive sleep apnea (adult) (pediatric): Secondary | ICD-10-CM | POA: Diagnosis not present

## 2018-02-06 ENCOUNTER — Other Ambulatory Visit: Payer: Self-pay | Admitting: Adult Health

## 2018-02-06 DIAGNOSIS — F329 Major depressive disorder, single episode, unspecified: Secondary | ICD-10-CM

## 2018-02-06 DIAGNOSIS — F32A Depression, unspecified: Secondary | ICD-10-CM

## 2018-02-09 ENCOUNTER — Ambulatory Visit: Payer: BLUE CROSS/BLUE SHIELD | Admitting: Physician Assistant

## 2018-02-09 VITALS — BP 104/64 | HR 72 | Temp 97.8°F | Resp 16 | Ht 59.0 in | Wt 238.4 lb

## 2018-02-09 DIAGNOSIS — I159 Secondary hypertension, unspecified: Secondary | ICD-10-CM | POA: Diagnosis not present

## 2018-02-09 DIAGNOSIS — E1122 Type 2 diabetes mellitus with diabetic chronic kidney disease: Secondary | ICD-10-CM | POA: Diagnosis not present

## 2018-02-09 DIAGNOSIS — N184 Chronic kidney disease, stage 4 (severe): Secondary | ICD-10-CM

## 2018-02-09 DIAGNOSIS — I2723 Pulmonary hypertension due to lung diseases and hypoxia: Secondary | ICD-10-CM

## 2018-02-09 DIAGNOSIS — I2699 Other pulmonary embolism without acute cor pulmonale: Secondary | ICD-10-CM | POA: Diagnosis not present

## 2018-02-09 DIAGNOSIS — F32A Depression, unspecified: Secondary | ICD-10-CM

## 2018-02-09 DIAGNOSIS — G4733 Obstructive sleep apnea (adult) (pediatric): Secondary | ICD-10-CM

## 2018-02-09 DIAGNOSIS — I5033 Acute on chronic diastolic (congestive) heart failure: Secondary | ICD-10-CM | POA: Diagnosis not present

## 2018-02-09 DIAGNOSIS — I4892 Unspecified atrial flutter: Secondary | ICD-10-CM

## 2018-02-09 DIAGNOSIS — F331 Major depressive disorder, recurrent, moderate: Secondary | ICD-10-CM

## 2018-02-09 DIAGNOSIS — E1165 Type 2 diabetes mellitus with hyperglycemia: Secondary | ICD-10-CM

## 2018-02-09 DIAGNOSIS — J449 Chronic obstructive pulmonary disease, unspecified: Secondary | ICD-10-CM

## 2018-02-09 DIAGNOSIS — I4891 Unspecified atrial fibrillation: Secondary | ICD-10-CM

## 2018-02-09 DIAGNOSIS — F329 Major depressive disorder, single episode, unspecified: Secondary | ICD-10-CM

## 2018-02-09 MED ORDER — POTASSIUM CHLORIDE ER 10 MEQ PO TBCR: EXTENDED_RELEASE_TABLET | ORAL | 0 refills | Status: DC

## 2018-02-09 MED ORDER — TRAZODONE HCL 100 MG PO TABS: ORAL_TABLET | ORAL | 1 refills | Status: DC

## 2018-02-09 MED ORDER — FUROSEMIDE 40 MG PO TABS: ORAL_TABLET | ORAL | 1 refills | Status: DC

## 2018-02-09 NOTE — Patient Instructions (Addendum)
Get an incentive spirometer and use a few times a day Continue your weight loss! Great job     When it comes to diets, agreement about the perfect plan isn't easy to find, even among the experts. Experts at the [place] developed an idea known as the Healthy Eating Plate. Just imagine a plate divided into logical, healthy portions.  The emphasis is on diet quality:  Load up on vegetables and fruits - one-half of your plate: Aim for color and variety, and remember that potatoes don't count.  Go for whole grains - one-quarter of your plate: Whole wheat, barley, wheat berries, quinoa, oats, brown rice, and foods made with them. If you want pasta, go with whole wheat pasta.  Protein power - one-quarter of your plate: Fish, chicken, beans, and nuts are all healthy, versatile protein sources. Limit red meat.  The diet, however, does go beyond the plate, offering a few other suggestions.  Use healthy plant oils, such as olive, canola, soy, corn, sunflower and peanut. Check the labels, and avoid partially hydrogenated oil, which have unhealthy trans fats.  If you're thirsty, drink water. Coffee and tea are good in moderation, but skip sugary drinks and limit milk and dairy products to one or two daily servings.  The type of carbohydrate in the diet is more important than the amount. Some sources of carbohydrates, such as vegetables, fruits, whole grains, and beans-are healthier than others.  Finally, stay active.  Do the carpal tunnel brace from a pharmacy at night for 4-6 weeks, if this does not help we can refer to ortho.  Carpal Tunnel Syndrome  Carpal tunnel syndrome is a condition that causes pain in your hand and arm. The carpal tunnel is a narrow area located on the palm side of your wrist. Repeated wrist motion or certain diseases may cause swelling within the tunnel. This swelling pinches the main nerve in the wrist (median nerve). What are the causes? This  condition may be caused by:  Repeated wrist motions.  Wrist injuries.  Arthritis.  A cyst or tumor in the carpal tunnel.  Fluid buildup during pregnancy.  Sometimes the cause of this condition is not known. What increases the risk? This condition is more likely to develop in:  People who have jobs that cause them to repeatedly move their wrists in the same motion, such as Art gallery manager.  Women.  People with certain conditions, such as: ? Diabetes. ? Obesity. ? An underactive thyroid (hypothyroidism). ? Kidney failure.  What are the signs or symptoms? Symptoms of this condition include:  A tingling feeling in your fingers, especially in your thumb, index, and middle fingers.  Tingling or numbness in your hand.  An aching feeling in your entire arm, especially when your wrist and elbow are bent for long periods of time.  Wrist pain that goes up your arm to your shoulder.  Pain that goes down into your palm or fingers.  A weak feeling in your hands. You may have trouble grabbing and holding items.  Your symptoms may feel worse during the night. How is this diagnosed? This condition is diagnosed with a medical history and physical exam. You may also have tests, including:  An electromyogram (EMG). This test measures electrical signals sent by your nerves into the muscles.  X-rays.  How is this treated? Treatment for this condition includes:  Lifestyle changes. It is important to stop doing or modify the activity that caused your condition.  Physical or occupational therapy.  Medicines for pain and inflammation. This may include medicine that is injected into your wrist.  A wrist splint.  Surgery.  Follow these instructions at home: If you have a splint:  Wear it as told by your health care provider. Remove it only as told by your health care provider.  Loosen the splint if your fingers become numb and tingle, or if they turn cold and  blue.  Keep the splint clean and dry. General instructions  Take over-the-counter and prescription medicines only as told by your health care provider.  Rest your wrist from any activity that may be causing your pain. If your condition is work related, talk to your employer about changes that can be made, such as getting a wrist pad to use while typing.  If directed, apply ice to the painful area: ? Put ice in a plastic bag. ? Place a towel between your skin and the bag. ? Leave the ice on for 20 minutes, 2-3 times per day.  Keep all follow-up visits as told by your health care provider. This is important.  Do any exercises as told by your health care provider, physical therapist, or occupational therapist. Contact a health care provider if:  You have new symptoms.  Your pain is not controlled with medicines.  Your symptoms get worse. This information is not intended to replace advice given to you by your health care provider. Make sure you discuss any questions you have with your health care provider. Document Released: 01/23/2000 Document Revised: 06/05/2015 Document Reviewed: 06/12/2014 Elsevier Interactive Patient Education  [employer].

## 2018-02-09 NOTE — Progress Notes (Signed)
Assessment and Plan:   Chronic diastolic heart failure ([place]) Continue follow up cardio Monitor weight daily She is on lasix once a day Will suggest compression socks  Chronic atrial fibrillation ([place]) Rate controlled, continue medications, xarelto Has follow up with cardiology coming up  Secondary hypertension - continue medications, DASH diet, exercise and monitor at home. Call if greater than 130/80.  -     CBC with Differential/Platelet -     BASIC METABOLIC PANEL WITH GFR -     Hepatic function panel -     TSH  Chronic obstructive pulmonary disease, unspecified COPD type ([place]) Continue weight loss, TRELOGY, do nebulizer when she gets home, continue CPAP  Hyperlipidemia, unspecified hyperlipidemia type -check lipids, decrease fatty foods, increase activity.  -     Lipid panel  Morbid obesity (BMI 49.6) - long discussion about weight loss, diet, and exercise NO SODA  OSA (obstructive sleep apnea) with COPD overlap syndrome On CPAP and doing better  Depression, unspecified depression type - continue medications, stress management techniques discussed, increase water, good sleep hygiene discussed, increase exercise, and increase veggies.  - GET BACK ON celexa every day  Medication management -     Magnesium   The patient was advised to call immediately if she has any concerning symptoms in the interval. The patient voices understanding of current treatment options and is in agreement with the current care plan.The patient knows to call the clinic with any problems, questions or concerns or go to the ER if any further progression of symptoms.   Patient walked out without getting labs, will contact and bring back for lab only.  Continue diet and meds as discussed. Discussed med's effects and SE's.  Future Appointments  Date Time Provider [place]  11/08/2018  2:00 PM Vicie Mutters, PA-C [facility] None  11/27/2018  3:30 PM Dohmeier, Asencion Partridge, MD [facility] None     HPI 58 y.o. female  presents for 3 month follow up with hypertension, hyperlipidemia, diabetes and vitamin D. Her blood pressure has been controlled at home, today their BP is BP: 104/64  She does not workout. She denies chest pain, shortness of breath, dizziness.  She has a history of afib and CHF, she is on amiodarone, cardizem, she is on xarleto, following with Dr. Haroldine Laws. She is on lasix 1 a day and zaroxyln once a week and 8 potassium a day she has been out since Monday, she is now on spirolactone. Her weight is up, she has some SOB. Wt Readings from Last 3 Encounters:  02/09/18 238 lb 6.4 oz (108.1 kg)  11/21/17 245 lb 3.2 oz (111.2 kg)  11/07/17 242 lb 9.6 oz (110 kg)   She had AKI with CKD but kidney function had improved last OV, still advised to see nephrology.  Lab Results  Component Value Date   GFRNONAA 79 (L) 11/07/2017   She also has PHTN due to COPD and OSA, she is on CPAP.  She is not on cholesterol medication and denies myalgias. Her cholesterol is not at goal. The cholesterol was: Lab Results  Component Value Date   CHOL 198 11/07/2017   HDL 37 (L) 11/07/2017   LDLCALC 127 (H) 11/07/2017   TRIG 199 (H) 11/07/2017   CHOLHDL 5.4 (H) 11/07/2017     She has been working on diet and exercise for Diabetes, and denies polydipsia, polyuria and visual disturbances. Last A1C  was:  Lab Results  Component Value Date   HGBA1C 6.5 (H) 11/07/2017  Patient is on Vitamin D supplement. She is on thyroid medication. Her medication was not changed last visit.  Lab Results  Component Value Date   TSH 2.66 11/07/2017  .  BMI is Body mass index is 48.15 kg/m.,  She has depression was started on celexa last OV that she states is helping.  Wt Readings from Last 3 Encounters:  02/09/18 238 lb 6.4 oz (108.1 kg)  11/21/17 245 lb 3.2 oz (111.2 kg)  11/07/17 242 lb 9.6 oz (110 kg)     Current Medications:  Current Outpatient Medications on File Prior to Visit   Medication Sig  . albuterol (PROAIR HFA) 108 (90 Base) MCG/ACT inhaler Inhale 2 puffs into the lungs every 6 (six) hours as needed for wheezing or shortness of breath.  Marland Kitchen albuterol (PROVENTIL HFA;VENTOLIN HFA) 108 (90 Base) MCG/ACT inhaler TAKE 2 PUFFS BY MOUTH EVERY 6 HOURS AS NEEDED FOR WHEEZE OR SHORTNESS OF BREATH (Patient taking differently: Inhale 2 puffs into the lungs every 6 (six) hours as needed for wheezing or shortness of breath. )  . albuterol (PROVENTIL) (2.5 MG/3ML) 0.083% nebulizer solution Take 3 mLs (2.5 mg total) by nebulization every 6 (six) hours as needed for wheezing or shortness of breath.  Marland Kitchen aspirin-acetaminophen-caffeine (EXCEDRIN MIGRAINE) 250-250-65 MG per tablet Take 1 tablet by mouth every 6 (six) hours as needed for headache.  . bisoprolol (ZEBETA) 5 MG tablet Take 1 tablet (5 mg total) by mouth daily.  . citalopram (CELEXA) 40 MG tablet TAKE 1 TABLET BY MOUTH EVERY DAY (Patient taking differently: Take 20 mg by mouth daily. )  . diltiazem (CARDIZEM CD) 360 MG 24 hr capsule Take 1 capsule (360 mg total) by mouth daily. Needs office visit for further refills [phone]  . diphenhydrAMINE HCl, Sleep, (SLEEP AID) 50 MG CAPS Take 1 capsule by mouth daily.  Marland Kitchen gabapentin (NEURONTIN) 300 MG capsule TAKE 1 CAPSULE BY MOUTH THREE TIMES A DAY (Patient taking differently: Take 300 mg by mouth every evening. )  . metolazone (ZAROXOLYN) 2.5 MG tablet As needed for increase in swelling (Patient taking differently: Take 2.5 mg by mouth once a week. As needed for increase in swelling)  . naproxen sodium (ANAPROX) 220 MG tablet Take 220 mg by mouth 2 (two) times daily as needed (back pain).  . nystatin cream (MYCOSTATIN) Apply 1 application topically 2 (two) times daily. (Patient taking differently: Apply 1 application topically 2 (two) times daily as needed for dry skin (ON FEET). )  . polyethylene glycol (MIRALAX / GLYCOLAX) packet Take 17 g by mouth daily as needed for moderate  constipation.   . potassium chloride (K-DUR) 10 MEQ tablet TAKE 2 TABS TWICE DAILY TAKE ADDITIONAL 2 TABS IF TAKE ADDITIONAL LASIX 40 (Patient taking differently: Take 80 mEq by mouth daily. )  . spironolactone (ALDACTONE) 25 MG tablet Take 0.5 tablets (12.5 mg total) by mouth daily.  Marland Kitchen terbinafine (LAMISIL) 250 MG tablet TAKE 1 TABLET BY MOUTH EVERY DAY  . TRELEGY ELLIPTA 100-62.5-25 MCG/INH AEPB INHALE 1 PUFF INTO THE LUNGS ONCE A DAY (Patient taking differently: Inhale 1 puff into the lungs every morning. )  . triamcinolone cream (KENALOG) 0.5 % Apply 1 application topically 2 (two) times daily. (Patient taking differently: Apply 1 application topically 2 (two) times daily as needed (IRRITATION). )  . XARELTO 20 MG TABS tablet TAKE 1 TABLET (20 MG TOTAL) DAILY WITH SUPPER. NEEDS OFFICE VISIT FOR FURTHER REFILLS (Patient taking differently: Take 20 mg by mouth daily. )  Current Facility-Administered Medications on File Prior to Visit  Medication  . ipratropium-albuterol (DUONEB) 0.5-2.5 (3) MG/3ML nebulizer solution 3 mL   Medical History:  Past Medical History:  Diagnosis Date  . Anemia   . Anxiety   . Atrial flutter ([place])   . Borderline diabetes   . Chronic diastolic heart failure ([place])   . COPD (chronic obstructive pulmonary disease) ([place])   . Depression   . GERD (gastroesophageal reflux disease)   . HTN (hypertension)   . Hyperlipidemia   . Obstructive sleep apnea   . Persistent atrial fibrillation    Myoview 4/09: No ischemia;  echo 4/09: EF 60-65%;   Flecainide, beta blocker, Coumadin;    Unable to take Tikosyn 2/2 prolonged QT  . Pulmonary embolism (H[facility])    Right lower lobe diagnosed by CT 6/12  . Type II or unspecified type diabetes mellitus without mention of complication, not stated as uncontrolled    Allergies:  Allergies  Allergen Reactions  . Sulfonamide Derivatives Hives and Itching  . Tikosyn [Dofetilide]     Prolonged qt    Review of Systems:  See  HPI  Family history- Review and unchanged Social history- Review and unchanged Physical Exam: BP 104/64   Pulse 72   Temp 97.8 F (36.6 C)   Resp 16   Ht _0  (1.499 m)   Wt 238 lb 6.4 oz (108.1 kg)   BMI 48.15 kg/m  Wt Readings from Last 3 Encounters:  02/09/18 238 lb 6.4 oz (108.1 kg)  11/21/17 245 lb 3.2 oz (111.2 kg)  11/07/17 242 lb 9.6 oz (110 kg)   General Appearance: Well nourished, obese, in no apparent distress. Eyes: PERRLA, EOMs, conjunctiva no swelling or erythema Sinuses: No Frontal/maxillary tenderness ENT/Mouth: Ext aud canals clear, TMs without erythema, bulging. No erythema, swelling, or exudate on post pharynx.Tonsils not swollen or erythematous. Hearing normal. Crowded mouth Neck: Supple, thyroid normal.  Respiratory: Decreased breath sounds bilateral, with wheezing and decreased breath sounds bilateral lower lobes Cardio: Irreg, irreg,  minimal edema Abdomen: Soft, + BS, obese,  Non tender, no guarding, rebound, hernias, masses. Lymphatics: Non tender without lymphadenopathy.  Musculoskeletal: Full ROM, 5/5 strength, antalgic gait Skin: well healing scar on left nostril, bilateral feet with white fungus, some thickening at toes, no ulcers. Warm, dry without rashes, lesions, ecchymosis.  Neuro: Cranial nerves intact. No cerebellar symptoms.  Psych: Awake and oriented X 3,  Insight and Judgment appropriate.    Vicie Mutters, PA-C 3:02 PM [facility]

## 2018-02-10 LAB — CBC WITH DIFFERENTIAL/PLATELET
Absolute Monocytes: 552 cells/uL (ref 200–950)
Basophils Absolute: 18 cells/uL (ref 0–200)
Basophils Relative: 0.3 %
Eosinophils Absolute: 42 cells/uL (ref 15–500)
Eosinophils Relative: 0.7 %
HCT: 43.2 % (ref 35.0–45.0)
Hemoglobin: 14.5 g/dL (ref 11.7–15.5)
Lymphs Abs: 1140 cells/uL (ref 850–3900)
MCH: 29 pg (ref 27.0–33.0)
MCHC: 33.6 g/dL (ref 32.0–36.0)
MCV: 86.4 fL (ref 80.0–100.0)
MPV: 11.6 fL (ref 7.5–12.5)
Monocytes Relative: 9.2 %
Neutro Abs: 4248 cells/uL (ref 1500–7800)
Neutrophils Relative %: 70.8 %
Platelets: 268 10*3/uL (ref 140–400)
RBC: 5 10*6/uL (ref 3.80–5.10)
RDW: 13.8 % (ref 11.0–15.0)
Total Lymphocyte: 19 %
WBC: 6 10*3/uL (ref 3.8–10.8)

## 2018-02-10 LAB — COMPLETE METABOLIC PANEL WITH GFR
AG Ratio: 1.4 (calc) (ref 1.0–2.5)
ALT: 24 U/L (ref 6–29)
AST: 17 U/L (ref 10–35)
Albumin: 4.3 g/dL (ref 3.6–5.1)
Alkaline phosphatase (APISO): 95 U/L (ref 33–130)
BUN/Creatinine Ratio: 14 (calc) (ref 6–22)
BUN: 25 mg/dL (ref 7–25)
CO2: 32 mmol/L (ref 20–32)
Calcium: 10.1 mg/dL (ref 8.6–10.4)
Chloride: 95 mmol/L — ABNORMAL LOW (ref 98–110)
Creat: 1.74 mg/dL — ABNORMAL HIGH (ref 0.50–1.05)
GFR, Est African American: 37 mL/min/{1.73_m2} — ABNORMAL LOW (ref >=60)
GFR, Est Non African American: 32 mL/min/{1.73_m2} — ABNORMAL LOW (ref >=60)
Globulin: 3 g/dL (calc) (ref 1.9–3.7)
Glucose, Bld: 92 mg/dL (ref 65–99)
Potassium: 2.9 mmol/L — ABNORMAL LOW (ref 3.5–5.3)
Sodium: 141 mmol/L (ref 135–146)
Total Bilirubin: 0.6 mg/dL (ref 0.2–1.2)
Total Protein: 7.3 g/dL (ref 6.1–8.1)

## 2018-02-10 LAB — HEMOGLOBIN A1C
Hgb A1c MFr Bld: 6.3 % of total Hgb — ABNORMAL HIGH (ref <5.7)
Mean Plasma Glucose: 134 (calc)
eAG (mmol/L): 7.4 (calc)

## 2018-02-10 LAB — LIPID PANEL
Cholesterol: 272 mg/dL — ABNORMAL HIGH (ref <200)
HDL: 42 mg/dL — ABNORMAL LOW (ref >50)
LDL Cholesterol (Calc): 185 mg/dL (calc) — ABNORMAL HIGH
Non-HDL Cholesterol (Calc): 230 mg/dL (calc) — ABNORMAL HIGH (ref <130)
Total CHOL/HDL Ratio: 6.5 (calc) — ABNORMAL HIGH (ref <5.0)
Triglycerides: 251 mg/dL — ABNORMAL HIGH (ref <150)

## 2018-02-10 LAB — TSH: TSH: 2.54 mIU/L (ref 0.40–4.50)

## 2018-02-13 DIAGNOSIS — G4733 Obstructive sleep apnea (adult) (pediatric): Secondary | ICD-10-CM | POA: Diagnosis not present

## 2018-02-16 ENCOUNTER — Other Ambulatory Visit: Payer: BLUE CROSS/BLUE SHIELD

## 2018-02-16 DIAGNOSIS — N184 Chronic kidney disease, stage 4 (severe): Secondary | ICD-10-CM

## 2018-02-16 DIAGNOSIS — E1165 Type 2 diabetes mellitus with hyperglycemia: Secondary | ICD-10-CM

## 2018-02-17 LAB — BASIC METABOLIC PANEL WITH GFR
BUN/Creatinine Ratio: 15 (calc) (ref 6–22)
BUN: 28 mg/dL — ABNORMAL HIGH (ref 7–25)
CO2: 31 mmol/L (ref 20–32)
Calcium: 9.5 mg/dL (ref 8.6–10.4)
Chloride: 98 mmol/L (ref 98–110)
Creat: 1.85 mg/dL — ABNORMAL HIGH (ref 0.50–1.05)
GFR, Est African American: 34 mL/min/{1.73_m2} — ABNORMAL LOW (ref >=60)
GFR, Est Non African American: 29 mL/min/{1.73_m2} — ABNORMAL LOW (ref >=60)
Glucose, Bld: 98 mg/dL (ref 65–99)
Potassium: 3.3 mmol/L — ABNORMAL LOW (ref 3.5–5.3)
Sodium: 140 mmol/L (ref 135–146)

## 2018-02-17 NOTE — Addendum Note (Signed)
Addended by: Vladimir Crofts on: 02/17/2018 08:35 AM   Modules accepted: Orders

## 2018-02-23 ENCOUNTER — Other Ambulatory Visit: Payer: Self-pay | Admitting: Adult Health

## 2018-02-23 DIAGNOSIS — I5032 Chronic diastolic (congestive) heart failure: Secondary | ICD-10-CM

## 2018-03-03 DIAGNOSIS — G4733 Obstructive sleep apnea (adult) (pediatric): Secondary | ICD-10-CM | POA: Diagnosis not present

## 2018-03-05 ENCOUNTER — Other Ambulatory Visit: Payer: Self-pay | Admitting: Adult Health

## 2018-03-16 ENCOUNTER — Telehealth: Payer: Self-pay | Admitting: Physician Assistant

## 2018-03-16 ENCOUNTER — Other Ambulatory Visit: Payer: Self-pay | Admitting: Physician Assistant

## 2018-03-16 DIAGNOSIS — G4733 Obstructive sleep apnea (adult) (pediatric): Secondary | ICD-10-CM | POA: Diagnosis not present

## 2018-03-16 MED ORDER — ALPRAZOLAM 0.25 MG PO TABS: ORAL_TABLET | ORAL | 0 refills | Status: DC

## 2018-03-16 NOTE — Telephone Encounter (Signed)
Patient's son attempted suicide, she is very distressed, unknown his status. Will send in xanax 0.2m 1-2 tablets as needed BID for panic attack- Suggest follow up in next 1-2 weeks if need more medication.

## 2018-03-20 ENCOUNTER — Other Ambulatory Visit: Payer: Self-pay

## 2018-04-11 ENCOUNTER — Ambulatory Visit: Payer: BLUE CROSS/BLUE SHIELD | Admitting: Adult Health Nurse Practitioner

## 2018-04-11 ENCOUNTER — Encounter: Payer: Self-pay | Admitting: Adult Health Nurse Practitioner

## 2018-04-11 VITALS — BP 128/90 | HR 86 | Temp 98.0°F | Ht 59.0 in | Wt 237.6 lb

## 2018-04-11 DIAGNOSIS — F419 Anxiety disorder, unspecified: Secondary | ICD-10-CM

## 2018-04-11 DIAGNOSIS — R062 Wheezing: Secondary | ICD-10-CM | POA: Diagnosis not present

## 2018-04-11 DIAGNOSIS — G4733 Obstructive sleep apnea (adult) (pediatric): Secondary | ICD-10-CM | POA: Diagnosis not present

## 2018-04-11 DIAGNOSIS — J441 Chronic obstructive pulmonary disease with (acute) exacerbation: Secondary | ICD-10-CM

## 2018-04-11 DIAGNOSIS — H6502 Acute serous otitis media, left ear: Secondary | ICD-10-CM

## 2018-04-11 DIAGNOSIS — J449 Chronic obstructive pulmonary disease, unspecified: Secondary | ICD-10-CM

## 2018-04-11 MED ORDER — IPRATROPIUM-ALBUTEROL 0.5-2.5 (3) MG/3ML IN SOLN: 3.0000 mL | Freq: Once | RESPIRATORY_TRACT | Status: DC

## 2018-04-11 MED ORDER — AZITHROMYCIN 250 MG PO TABS: ORAL_TABLET | ORAL | 0 refills | Status: AC

## 2018-04-11 MED ORDER — PREDNISONE 10 MG (21) PO TBPK: ORAL_TABLET | Freq: Every day | ORAL | 0 refills | Status: DC

## 2018-04-11 MED ORDER — DEXAMETHASONE SODIUM PHOSPHATE 10 MG/ML IJ SOLN: 10.0000 mg | Freq: Once | INTRAMUSCULAR | Status: DC

## 2018-04-11 MED ORDER — ALPRAZOLAM 0.25 MG PO TABS: ORAL_TABLET | ORAL | 0 refills | Status: DC

## 2018-04-11 MED ORDER — IPRATROPIUM-ALBUTEROL 0.5-2.5 (3) MG/3ML IN SOLN: 3.0000 mL | RESPIRATORY_TRACT | 0 refills | Status: DC | PRN

## 2018-04-11 NOTE — Patient Instructions (Addendum)
We will contact [employer] to have them come check your equiptment and get you set up with a nebulizer & supplies.   I have sent in nebulizer for you.    We will send in prednisone taper pack, take as directed and start in the morning.  Azithromyacin antibiotic was also sent in.  Clean your CPAP tubing and replace or clean regularly.  Use the Nebulizer treatment every 4 hours as needed for shortness of breath or wheezing.

## 2018-04-11 NOTE — Progress Notes (Signed)
Assessment and Plan:  [name] was seen today for wheezing and nasal congestion.  Diagnoses and all orders for this visit:   COPD exacerbation ([place]) -     ipratropium-albuterol (DUONEB) 0.5-2.5 (3) MG/3ML nebulizer solution 3 mL -     dexamethasone (DECADRON) injection 10 mg -     DME Nebulizer machine -     ipratropium-albuterol (DUONEB) 0.5-2.5 (3) MG/3ML SOLN; Take 3 mLs by nebulization every 4 (four) hours as needed. Max:6 doses per day -     azithromycin (ZITHROMAX Z-PAK) 250 MG tablet; Take 2 tablets PO on day 1, then take 1 tablet PO every day for 4 days for pneumonia. -     predniSONE (STERAPRED UNI-PAK 21 TAB) 10 MG (21) TBPK tablet; Take by mouth daily. Follow directions on Taper pack Discussed OTC Mucinex Q12 to help clear mucus  Due to symptoms of SOB associated with Acute exacerbation of chronic bronchitis, we will put the patient on a nebulizer. Patient has been on inhalers but they are not helpful due to poor coordination and low FEV. We are ordering the following nebulizer medications which have been added to the medication list:-  ipratropium-albuterol (DUONEB) 0.5-2.5 (3) MG/3ML nebulizer solution 3 mL. Q4 PRN for shortness of breath and or wheezing. In office, tolerated well, improved wheezing after treatment. Rx Sent.  Wheezing -     dexamethasone (DECADRON) injection 10 mg In office, tolerated well. -     DME Nebulizer machine and evaluation of home supplies from current supply company. -     predniSONE (STERAPRED UNI-PAK 21 TAB) 10 MG (21) TBPK tablet; Take by mouth daily. Follow directions on Taper pack, start tomorrow.  Non-recurrent acute serous otitis media, left Discussed OTC zyrtec nightly May cause drowsiness  Anxiety Doing well on current regiment. -     ALPRAZolam (XANAX) 0.25 MG tablet; 1-2 tablets as needed BID for panic attacks   OSA and COPD overlap syndrome (HCC) Discussed hygiene of mask and tubing and significance. Clean at least twice a month,  more with acute illness.  Has new tubing available. Patient reports she does not have manageable portable oxygen and not wearing any with her today at appointment. Discussed importance of having this available for her.  Morbid obesity (BMI 49.6) Discussed dietary and exercise modifications    Further disposition pending results of labs. Discussed med's effects and SE's.   Over 30 minutes of exam, counseling, chart review, and critical decision making was performed.   Future Appointments  Date Time Provider [place]  05/18/2018  4:00 PM Vicie Mutters, [place] [facility] None  11/08/2018  2:00 PM Vicie Mutters, PA-C [facility] None  11/27/2018  3:30 PM Dohmeier, Asencion Partridge, MD [facility] None    ------------------------------------------------------------------------------------------------------------------   HPI 58 y.o.female presents for wheezing and shortness of breath.  She is using trelegy daily and also the albuterol in haler twice a day but reports her breathing is getting worse.  She picked up some coricidin and has been using for the past three days.  She has not taken any other OTC medications.  She reports her cough has been productive at times.  She is also having wheezing which get worse with exertion. Reports she uses 2.5L O2 while at home via nasal cannula.  She is not wearing any oxygen while in the office today and reports that the tanks she has are too big to bring with her.  Also reports she does not take her oxygen to work and has been short of  breath during her sift. She sleeps with the CPAP nightly.  Reports she does clean out the mask with alcohol.  She has not changed the tubing since she has had the machine but she has a new tubing at the house.      Past Medical History:  Diagnosis Date  . Anemia   . Anxiety   . Atrial flutter ([place])   . Borderline diabetes   . Chronic diastolic heart failure ([place])   . COPD (chronic obstructive pulmonary disease) ([place])   .  Depression   . GERD (gastroesophageal reflux disease)   . HTN (hypertension)   . Hyperlipidemia   . Obstructive sleep apnea   . Persistent atrial fibrillation    Myoview 4/09: No ischemia;  echo 4/09: EF 60-65%;   Flecainide, beta blocker, Coumadin;    Unable to take Tikosyn 2/2 prolonged QT  . Pulmonary embolism (HCC)    Right lower lobe diagnosed by CT 6/12  . Type II or unspecified type diabetes mellitus without mention of complication, not stated as uncontrolled      Allergies  Allergen Reactions  . Sulfonamide Derivatives Hives and Itching  . Tikosyn [Dofetilide]     Prolonged qt    Current Outpatient Medications on File Prior to Visit  Medication Sig  . albuterol (PROAIR HFA) 108 (90 Base) MCG/ACT inhaler Inhale 2 puffs into the lungs every 6 (six) hours as needed for wheezing or shortness of breath.  Marland Kitchen aspirin-acetaminophen-caffeine (EXCEDRIN MIGRAINE) 250-250-65 MG per tablet Take 1 tablet by mouth every 6 (six) hours as needed for headache.  . bisoprolol (ZEBETA) 5 MG tablet Take 1 tablet (5 mg total) by mouth daily.  . citalopram (CELEXA) 40 MG tablet TAKE 1 TABLET BY MOUTH EVERY DAY (Patient taking differently: Take 20 mg by mouth daily. )  . diltiazem (CARDIZEM CD) 360 MG 24 hr capsule Take 1 capsule (360 mg total) by mouth daily. Needs office visit for further refills [phone]  . diphenhydrAMINE HCl, Sleep, (SLEEP AID) 50 MG CAPS Take 1 capsule by mouth daily.  . furosemide (LASIX) 40 MG tablet TAKE 2-3 TABLETs BY MOUTH EVERY DAY  . gabapentin (NEURONTIN) 300 MG capsule TAKE 1 CAPSULE BY MOUTH THREE TIMES A DAY (Patient taking differently: Take 300 mg by mouth every evening. )  . metolazone (ZAROXOLYN) 2.5 MG tablet As needed for increase in swelling (Patient taking differently: Take 2.5 mg by mouth once a week. As needed for increase in swelling)  . naproxen sodium (ANAPROX) 220 MG tablet Take 220 mg by mouth 2 (two) times daily as needed (back pain).  . nystatin cream  (MYCOSTATIN) Apply 1 application topically 2 (two) times daily. (Patient taking differently: Apply 1 application topically 2 (two) times daily as needed for dry skin (ON FEET). )  . polyethylene glycol (MIRALAX / GLYCOLAX) packet Take 17 g by mouth daily as needed for moderate constipation.   . potassium chloride (K-DUR) 10 MEQ tablet TAKE 2 TABS TWICE DAILY TAKE ADDITIONAL 2 TABS IF TAKE ADDITIONAL LASIX 40  . spironolactone (ALDACTONE) 25 MG tablet Take 0.5 tablets (12.5 mg total) by mouth daily.  . traZODone (DESYREL) 100 MG tablet Take 1 tablet at hour of Sleep  . TRELEGY ELLIPTA 100-62.5-25 MCG/INH AEPB INHALE 1 PUFF INTO THE LUNGS ONCE A DAY  . triamcinolone cream (KENALOG) 0.5 % Apply 1 application topically 2 (two) times daily. (Patient taking differently: Apply 1 application topically 2 (two) times daily as needed (IRRITATION). )  . XARELTO 20  MG TABS tablet TAKE 1 TABLET (20 MG TOTAL) DAILY WITH SUPPER. NEEDS OFFICE VISIT FOR FURTHER REFILLS  . albuterol (PROVENTIL) (2.5 MG/3ML) 0.083% nebulizer solution Take 3 mLs (2.5 mg total) by nebulization every 6 (six) hours as needed for wheezing or shortness of breath. (Patient not taking: Reported on 04/11/2018)   Current Facility-Administered Medications on File Prior to Visit  Medication  . ipratropium-albuterol (DUONEB) 0.5-2.5 (3) MG/3ML nebulizer solution 3 mL    ROS: Review of Systems  Constitutional: Negative for chills, diaphoresis, fever, malaise/fatigue and weight loss.  HENT: Negative for congestion, ear discharge, ear pain, hearing loss, nosebleeds, sinus pain, sore throat and tinnitus.   Eyes: Negative for blurred vision, double vision, photophobia, pain, discharge and redness.  Respiratory: Positive for cough, sputum production, shortness of breath and wheezing. Negative for hemoptysis and stridor.   Cardiovascular: Negative for chest pain, palpitations, orthopnea, claudication, leg swelling and PND.  Gastrointestinal: Negative  for abdominal pain, blood in stool, constipation, diarrhea, heartburn, melena, nausea and vomiting.  Genitourinary: Negative for dysuria, flank pain, frequency, hematuria and urgency.  Musculoskeletal: Negative for back pain, falls, joint pain, myalgias and neck pain.  Skin: Negative for itching and rash.  Neurological: Negative for dizziness and headaches.  Psychiatric/Behavioral: The patient is nervous/anxious.    Physical Exam:  BP 128/90   Pulse 86   Temp 98 F (36.7 C)   Ht _0  (1.499 m)   Wt 237 lb 9.6 oz (107.8 kg)   SpO2 90%   BMI 47.99 kg/m   General Appearance: Pale, in no apparent distress. Eyes: PERRLA, EOMs, conjunctiva no swelling or erythema Sinuses: No Frontal/maxillary tenderness ENT/Mouth: Ext aud canals clear, TMs without erythema, bulging. Mild serous noted behind left TM. No erythema, swelling, or exudate on post pharynx.  Tonsils not swollen or erythematous. Hearing normal.   Neck: Supple, thyroid normal.  Respiratory: Respiratory effort increased, BS equal bilaterally with wheezing and rales in all lobes.  Negative for stridor.  Cardio: RRR with no MRGs. Brisk peripheral pulses without edema BUE and BLE. Abdomen: Soft, obese, + BS.  Non tender, no guarding, rebound, hernias, masses. Lymphatics: Non tender without lymphadenopathy.  Musculoskeletal: Full ROM, 5/5 strength, normal gait.  Skin: Warm, dry without rashes, lesions, ecchymosis.  Neuro: Cranial nerves intact. Normal muscle tone, no cerebellar symptoms. Sensation intact.  Psych: Awake and oriented X 3, normal affect, Insight and Judgment appropriate.     Garnet Sierras, NP 8:21 AM [facility]

## 2018-04-12 ENCOUNTER — Other Ambulatory Visit: Payer: Self-pay | Admitting: Internal Medicine

## 2018-04-12 ENCOUNTER — Telehealth: Payer: Self-pay | Admitting: Adult Health Nurse Practitioner

## 2018-04-12 NOTE — Telephone Encounter (Signed)
patient called to advise she has a nebulizer, do not order a new nebulizer. Patient states will follow up with Cpap D[facility] [employer] when she returns from beach.

## 2018-04-13 ENCOUNTER — Encounter: Payer: Self-pay | Admitting: Adult Health Nurse Practitioner

## 2018-04-14 DIAGNOSIS — G4733 Obstructive sleep apnea (adult) (pediatric): Secondary | ICD-10-CM | POA: Diagnosis not present

## 2018-04-27 ENCOUNTER — Other Ambulatory Visit: Payer: Self-pay | Admitting: Physician Assistant

## 2018-05-02 ENCOUNTER — Other Ambulatory Visit: Payer: Self-pay | Admitting: Adult Health Nurse Practitioner

## 2018-05-02 DIAGNOSIS — J441 Chronic obstructive pulmonary disease with (acute) exacerbation: Secondary | ICD-10-CM

## 2018-05-11 ENCOUNTER — Other Ambulatory Visit: Payer: Self-pay | Admitting: Adult Health

## 2018-05-15 DIAGNOSIS — G4733 Obstructive sleep apnea (adult) (pediatric): Secondary | ICD-10-CM | POA: Diagnosis not present

## 2018-05-17 NOTE — Progress Notes (Signed)
THIS ENCOUNTER IS A VIRTUAL/TELEPHONE VISIT DUE TO COVID-19 - PATIENT WAS NOT SEEN IN THE OFFICE.  PATIENT HAS CONSENTED TO VIRTUAL VISIT / TELEMEDICINE VISIT  This provider placed a call to [name] using telephone, her appointment was changed to a virtual office visit to reduce the risk of exposure to the COVID-19 virus and to help [name] remain healthy and safe. The virtual visit will also provide continuity of care. She verbalizes understanding.    Assessment and Plan:   Chronic diastolic heart failure ([place]) Continue follow up cardio Monitor weight daily- GET A SCALE AT THE HOME She is on lasix once a day Will suggest compression socks  Chronic atrial fibrillation (HCC) Rate controlled, continue medications, xarelto  Secondary hypertension - continue medications, DASH diet, exercise and monitor at home GET A MONITOR. Call if greater than 130/80.   Chronic obstructive pulmonary disease, unspecified COPD type ([place]) Continue weight loss, TRELOGY, do nebulizer when she gets home, continue CPAP  Hyperlipidemia, unspecified hyperlipidemia type -check lipids, decrease fatty foods, increase activity.   Morbid obesity (BMI 49.6) - long discussion about weight loss, diet, and exercise NO SODA  OSA (obstructive sleep apnea) with COPD overlap syndrome On CPAP and doing better  Depression, unspecified depression type - continue medications, stress management techniques discussed, increase water, good sleep hygiene discussed, increase exercise, and increase veggies.  - GET BACK ON celexa every day  The patient was advised to call immediately if she has any concerning symptoms in the interval. The patient voices understanding of current treatment options and is in agreement with the current care plan.The patient knows to call the clinic with any problems, questions or concerns or go to the ER if any further progression of symptoms.   Patient walked out without getting  labs, will contact and bring back for lab only.  Continue diet and meds as discussed. Discussed med's effects and SE's.  Future Appointments  Date Time Provider [place]  05/18/2018  4:00 PM Vicie Mutters, [place] [facility] None  11/08/2018  2:00 PM Vicie Mutters, PA-C [facility] None  11/27/2018  3:30 PM Dohmeier, Asencion Partridge, MD [facility] None    HPI 58 y.o. female  presents for 3 month follow up with hypertension, hyperlipidemia, diabetes and vitamin D. Her blood pressure IS NOT BEING CHECKED AT HOME.  She does not workout. She denies chest pain DIZZINESS, SHE IS HAVING SHORTNESS OF BREATH.  Lab Results  Component Value Date   CREATININE 1.85 (H) 02/16/2018   BUN 28 (H) 02/16/2018   NA 140 02/16/2018   K 3.3 (L) 02/16/2018   CL 98 02/16/2018   CO2 31 02/16/2018   She has a history of afib and CHF, she is on amiodarone, cardizem, she is on xarleto, following with Dr. Haroldine Laws. She is on lasix 1 a day and zaroxyln once a week and 5 potassium a day (SUPPOSE TO BE ON 8 A DAY) she has been out since Monday, she is now on spirolactone 1/2 AT NIGHT.  STARTED BACK ON HER TRELEGY YESTERDAY SHE IS INACTIVE WORKING FROM HOME. SHE HAS BEEN EATING MORE MICROWAVE FOODS.   BMI is Body mass index is 47.99 kg/m., she is working on diet and exercise. Wt Readings from Last 3 Encounters:  04/11/18 237 lb 9.6 oz (107.8 kg)  02/09/18 238 lb 6.4 oz (108.1 kg)  11/21/17 245 lb 3.2 oz (111.2 kg)   She is on celexa for depression, SHE IS ON 1/2 CELEXA.  She had AKI  with CKD but kidney function had improved last OV, still advised to see nephrology.  Lab Results  Component Value Date   GFRNONAA 23 (L) 02/16/2018   She also has PHTN due to COPD and OSA, she is on CPAP AND SLEEPING WELL- SHE IS ON TRAZODONE. Marland Kitchen  She is not on cholesterol medication and denies myalgias. Her cholesterol is not at goal. The cholesterol was: Lab Results  Component Value Date   CHOL 272 (H) 02/09/2018   HDL 42 (L)  02/09/2018   LDLCALC 185 (H) 02/09/2018   TRIG 251 (H) 02/09/2018   CHOLHDL 6.5 (H) 02/09/2018     She has been working on diet and exercise for Diabetes, and denies polydipsia, polyuria and visual disturbances. Last A1C  was:  Lab Results  Component Value Date   HGBA1C 6.3 (H) 02/09/2018    Current Medications:  Current Outpatient Medications on File Prior to Visit  Medication Sig  . albuterol (PROAIR HFA) 108 (90 Base) MCG/ACT inhaler Inhale 2 puffs into the lungs every 6 (six) hours as needed for wheezing or shortness of breath.  Marland Kitchen albuterol (PROVENTIL) (2.5 MG/3ML) 0.083% nebulizer solution Take 3 mLs (2.5 mg total) by nebulization every 6 (six) hours as needed for wheezing or shortness of breath.  . ALPRAZolam (XANAX) 0.25 MG tablet 1-2 tablets as needed BID for panic attack- Suggest follow up in next 2 weeks if need more medication.  Marland Kitchen aspirin-acetaminophen-caffeine (EXCEDRIN MIGRAINE) 250-250-65 MG per tablet Take 1 tablet by mouth every 6 (six) hours as needed for headache.  . bisoprolol (ZEBETA) 5 MG tablet Take 1 tablet (5 mg total) by mouth daily.  . citalopram (CELEXA) 40 MG tablet TAKE 1 TABLET BY MOUTH EVERY DAY (Patient taking differently: Take 20 mg by mouth daily. )  . diltiazem (CARDIZEM CD) 360 MG 24 hr capsule Take 1 capsule (360 mg total) by mouth daily. Needs office visit for further refills [phone]  . diphenhydrAMINE HCl, Sleep, (SLEEP AID) 50 MG CAPS Take 1 capsule by mouth daily.  . furosemide (LASIX) 40 MG tablet TAKE 2-3 TABLETs BY MOUTH EVERY DAY  . gabapentin (NEURONTIN) 300 MG capsule TAKE 1 CAPSULE BY MOUTH THREE TIMES A DAY (Patient taking differently: Take 300 mg by mouth every evening. )  . ipratropium-albuterol (DUONEB) 0.5-2.5 (3) MG/3ML SOLN TAKE 3 MLS BY NEBULIZATION EVERY 4 (FOUR) HOURS AS NEEDED. MAX:6 DOSES PER DAY  . metolazone (ZAROXOLYN) 2.5 MG tablet As needed for increase in swelling (Patient taking differently: Take 2.5 mg by mouth once a  week. As needed for increase in swelling)  . naproxen sodium (ANAPROX) 220 MG tablet Take 220 mg by mouth 2 (two) times daily as needed (back pain).  . nystatin cream (MYCOSTATIN) Apply 1 application topically 2 (two) times daily. (Patient taking differently: Apply 1 application topically 2 (two) times daily as needed for dry skin (ON FEET). )  . polyethylene glycol (MIRALAX / GLYCOLAX) packet Take 17 g by mouth daily as needed for moderate constipation.   . potassium chloride (K-DUR) 10 MEQ tablet TAKE 2 TABS TWICE DAILY TAKE ADDITIONAL 2 TABS IF TAKE ADDITIONAL LASIX 40  . spironolactone (ALDACTONE) 25 MG tablet Take 0.5 tablets (12.5 mg total) by mouth daily.  Marland Kitchen terbinafine (LAMISIL) 250 MG tablet TAKE 1 TABLET BY MOUTH EVERY DAY  . traZODone (DESYREL) 100 MG tablet Take 1 tablet at hour of Sleep  . TRELEGY ELLIPTA 100-62.5-25 MCG/INH AEPB INHALE 1 PUFF INTO THE LUNGS ONCE A DAY  .  triamcinolone cream (KENALOG) 0.5 % Apply 1 application topically 2 (two) times daily. (Patient taking differently: Apply 1 application topically 2 (two) times daily as needed (IRRITATION). )  . XARELTO 20 MG TABS tablet TAKE 1 TABLET (20 MG TOTAL) DAILY WITH SUPPER. NEEDS OFFICE VISIT FOR FURTHER REFILLS   Current Facility-Administered Medications on File Prior to Visit  Medication  . dexamethasone (DECADRON) injection 10 mg  . ipratropium-albuterol (DUONEB) 0.5-2.5 (3) MG/3ML nebulizer solution 3 mL  . ipratropium-albuterol (DUONEB) 0.5-2.5 (3) MG/3ML nebulizer solution 3 mL   Medical History:  Past Medical History:  Diagnosis Date  . Anemia   . Anxiety   . Atrial flutter ([place])   . Borderline diabetes   . Chronic diastolic heart failure ([place])   . COPD (chronic obstructive pulmonary disease) ([place])   . Depression   . GERD (gastroesophageal reflux disease)   . HTN (hypertension)   . Hyperlipidemia   . Obstructive sleep apnea   . Persistent atrial fibrillation    Myoview 4/09: No ischemia;  echo 4/09: EF  60-65%;   Flecainide, beta blocker, Coumadin;    Unable to take Tikosyn 2/2 prolonged QT  . Pulmonary embolism (HCC)    Right lower lobe diagnosed by CT 6/12  . Type II or unspecified type diabetes mellitus without mention of complication, not stated as uncontrolled    Allergies:  Allergies  Allergen Reactions  . Sulfonamide Derivatives Hives and Itching  . Tikosyn [Dofetilide]     Prolonged qt    Review of Systems:  See HPI  Family history- Review and unchanged Social history- Review and unchanged Physical Exam: Ht _0  (1.499 m)   BMI 47.99 kg/m  Wt Readings from Last 3 Encounters:  04/11/18 237 lb 9.6 oz (107.8 kg)  02/09/18 238 lb 6.4 oz (108.1 kg)  11/21/17 245 lb 3.2 oz (111.2 kg)   General Appearance:Well sounding, in no apparent distress.  ENT/Mouth: No hoarseness, No cough for duration of visit.  Respiratory: completing full sentences without distress, without audible wheeze Neuro: Awake and oriented X 3,  Psych:  Insight and Judgment appropriate.     Vicie Mutters, PA-C 3:24 PM [facility]

## 2018-05-18 ENCOUNTER — Encounter: Payer: Self-pay | Admitting: Physician Assistant

## 2018-05-18 ENCOUNTER — Ambulatory Visit: Payer: BLUE CROSS/BLUE SHIELD | Admitting: Physician Assistant

## 2018-05-18 ENCOUNTER — Other Ambulatory Visit: Payer: Self-pay

## 2018-05-18 VITALS — Ht 59.0 in

## 2018-05-18 DIAGNOSIS — E1165 Type 2 diabetes mellitus with hyperglycemia: Secondary | ICD-10-CM

## 2018-05-18 DIAGNOSIS — I5033 Acute on chronic diastolic (congestive) heart failure: Secondary | ICD-10-CM

## 2018-05-18 DIAGNOSIS — F331 Major depressive disorder, recurrent, moderate: Secondary | ICD-10-CM

## 2018-05-18 DIAGNOSIS — E038 Other specified hypothyroidism: Secondary | ICD-10-CM

## 2018-05-18 DIAGNOSIS — I159 Secondary hypertension, unspecified: Secondary | ICD-10-CM | POA: Diagnosis not present

## 2018-05-18 DIAGNOSIS — Z130 Encounter for screening for diseases of the blood and blood-forming organs and certain disorders involving the immune mechanism: Secondary | ICD-10-CM

## 2018-05-18 DIAGNOSIS — I4891 Unspecified atrial fibrillation: Secondary | ICD-10-CM | POA: Diagnosis not present

## 2018-05-18 DIAGNOSIS — Z79899 Other long term (current) drug therapy: Secondary | ICD-10-CM

## 2018-05-18 DIAGNOSIS — J449 Chronic obstructive pulmonary disease, unspecified: Secondary | ICD-10-CM

## 2018-05-18 DIAGNOSIS — E1122 Type 2 diabetes mellitus with diabetic chronic kidney disease: Secondary | ICD-10-CM

## 2018-05-18 DIAGNOSIS — Z86711 Personal history of pulmonary embolism: Secondary | ICD-10-CM | POA: Diagnosis not present

## 2018-05-18 DIAGNOSIS — E785 Hyperlipidemia, unspecified: Secondary | ICD-10-CM

## 2018-05-18 DIAGNOSIS — N184 Chronic kidney disease, stage 4 (severe): Secondary | ICD-10-CM

## 2018-05-21 ENCOUNTER — Other Ambulatory Visit: Payer: Self-pay

## 2018-05-21 ENCOUNTER — Encounter (HOSPITAL_COMMUNITY): Payer: Self-pay

## 2018-05-21 ENCOUNTER — Ambulatory Visit (HOSPITAL_COMMUNITY)
Admission: EM | Admit: 2018-05-21 | Discharge: 2018-05-21 | Disposition: A | Discharge status: Home / Self Care | Payer: BLUE CROSS/BLUE SHIELD | Attending: Family Medicine | Admitting: Family Medicine

## 2018-05-21 DIAGNOSIS — K0889 Other specified disorders of teeth and supporting structures: Secondary | ICD-10-CM | POA: Diagnosis not present

## 2018-05-21 MED ORDER — CHLORHEXIDINE GLUCONATE 0.12 % MT SOLN: 15.0000 mL | Freq: Two times a day (BID) | OROMUCOSAL | 0 refills | Status: DC

## 2018-05-21 MED ORDER — HYDROCODONE-ACETAMINOPHEN 5-325 MG PO TABS: 1.0000 | ORAL_TABLET | Freq: Four times a day (QID) | ORAL | 0 refills | Status: DC | PRN

## 2018-05-21 MED ORDER — PENICILLIN V POTASSIUM 500 MG PO TABS: 500.0000 mg | ORAL_TABLET | Freq: Four times a day (QID) | ORAL | 0 refills | Status: AC

## 2018-05-21 NOTE — ED Provider Notes (Signed)
[place]    CSN: [id number] Arrival date & time: 05/21/18  1639     History   Chief Complaint Chief Complaint  Patient presents with  . Dental Pain    HPI [name] is a 58 y.o. female.   58 year old female comes in for 1 week history of dental pain. Has unknown cavities/cracked tooth, but states has not seen a dentist in awhile. Pain to the right lower jaw that radiates to the chin and ear. Denies swelling of the throat, trouble breathing, trouble swallowing, tripoding, drooling, trismus. Denies fever, chills, night sweats. Has been gurgling salt water and baking soda water for symptoms. Has been taking aleve with some relief.      Past Medical History:  Diagnosis Date  . Anemia   . Anxiety   . Atrial flutter ([place])   . Borderline diabetes   . Chronic diastolic heart failure ([place])   . COPD (chronic obstructive pulmonary disease) ([place])   . Depression   . GERD (gastroesophageal reflux disease)   . HTN (hypertension)   . Hyperlipidemia   . Obstructive sleep apnea   . Persistent atrial fibrillation    Myoview 4/09: No ischemia;  echo 4/09: EF 60-65%;   Flecainide, beta blocker, Coumadin;    Unable to take Tikosyn 2/2 prolonged QT  . Pulmonary embolism (HCC)    Right lower lobe diagnosed by CT 6/12  . Type II or unspecified type diabetes mellitus without mention of complication, not stated as uncontrolled     Patient Active Problem List   Diagnosis Date Noted  . CKD (chronic kidney disease) stage 4, GFR 15-29 ml/min (HCC) 11/06/2017  . CKD stage 4 due to type 2 diabetes mellitus ([place]) 08/04/2016  . Pulmonary hypertension due to COPD ([place]) 08/04/2016  . Noncompliance 12/18/2013  . Vitamin D deficiency 10/22/2013  . Morbid obesity (BMI 49.6) 10/22/2013  . Hyperlipidemia   . HTN (hypertension)   . Diabetes type 2, uncontrolled ([place])   . Anxiety   . Anemia   . Hypothyroid 07/18/2013  . Atrial fibrillation ([place]) 01/27/2012  . COPD (chronic  obstructive pulmonary disease) ([place]) 08/05/2010  . CHF NYHA class III (symptoms with mildly strenuous activities), acute on chronic, diastolic ([place])   . History of pulmonary embolus (PE)   . Atrial flutter ([place])   . OSA and COPD overlap syndrome ([place]) 07/21/2007  . Depression, major, recurrent, moderate ([place]) 07/20/2007  . G E R D 07/20/2007    Past Surgical History:  Procedure Laterality Date  . basal skin can    . CARDIOVERSION  01/19/2012   Procedure: CARDIOVERSION;  Surgeon: Jolaine Artist, MD;  Location[facility] ENDOSCOPY;  Service: Cardiovascular;  Laterality: N/A;  . CARDIOVERSION  01/21/2012   Procedure: CARDIOVERSION;  Surgeon: Carlena Bjornstad, MD;  Location: [facility];  Service: Cardiovascular;  Laterality: N/A;  Rm [id number]  . TEE WITHOUT CARDIOVERSION  01/19/2012   Procedure: TRANSESOPHAGEAL ECHOCARDIOGRAM (TEE);  Surgeon: Jolaine Artist, MD;  Location: [facility] ENDOSCOPY;  Service: Cardiovascular;  Laterality: N/A;  . TUBAL LIGATION  1996    OB History   No obstetric history on file.      Home Medications    Prior to Admission medications   Medication Sig Start Date End Date Taking? Authorizing Provider  albuterol (PROAIR HFA) 108 (90 Base) MCG/ACT inhaler Inhale 2 puffs into the lungs every 6 (six) hours as needed for wheezing or shortness of breath. 07/26/17   Liane Comber, NP  albuterol (PROVENTIL) (2.5 MG/3ML) 0.083% nebulizer solution Take 3 mLs (2.5 mg total) by nebulization every 6 (six) hours as needed for wheezing or shortness of breath. 07/29/16   Vicie Mutters, PA-C  ALPRAZolam Duanne Moron) 0.25 MG tablet 1-2 tablets as needed BID for panic attack- Suggest follow up in next 2 weeks if need more medication. 04/11/18   Garnet Sierras, NP  aspirin-acetaminophen-caffeine (EXCEDRIN MIGRAINE) [id number] MG per tablet Take 1 tablet by mouth every 6 (six) hours as needed for headache.    [provider]  bisoprolol (ZEBETA) 5 MG tablet Take 1 tablet (5 mg total)  by mouth daily. 05/09/17   Vicie Mutters, PA-C  chlorhexidine (PERIDEX) 0.12 % solution Use as directed 15 mLs in the mouth or throat 2 (two) times daily. 05/21/18   Tasia Catchings, Crestina Strike V, PA-C  citalopram (CELEXA) 40 MG tablet TAKE 1 TABLET BY MOUTH EVERY DAY Patient taking differently: Take 20 mg by mouth daily.  11/09/17   Vicie Mutters, PA-C  diltiazem (CARDIZEM CD) 360 MG 24 hr capsule Take 1 capsule (360 mg total) by mouth daily. Needs office visit for further refills [phone] 05/19/17   Bensimhon, Shaune Pascal, MD  diphenhydrAMINE HCl, Sleep, (SLEEP AID) 50 MG CAPS Take 1 capsule by mouth daily.    [provider]  furosemide (LASIX) 40 MG tablet TAKE 2-3 TABLETs BY MOUTH EVERY DAY 02/09/18   Vicie Mutters, PA-C  gabapentin (NEURONTIN) 300 MG capsule TAKE 1 CAPSULE BY MOUTH THREE TIMES A DAY Patient taking differently: Take 300 mg by mouth every evening.  11/30/17   Vicie Mutters, PA-C  HYDROcodone-acetaminophen (NORCO/VICODIN) 5-325 MG tablet Take 1 tablet by mouth every 6 (six) hours as needed for severe pain. 05/21/18   Eloise Mula V, PA-C  ipratropium-albuterol (DUONEB) 0.5-2.5 (3) MG/3ML SOLN TAKE 3 MLS BY NEBULIZATION EVERY 4 (FOUR) HOURS AS NEEDED. MAX:6 DOSES PER DAY 05/02/18   Garnet Sierras, NP  metolazone (ZAROXOLYN) 2.5 MG tablet As needed for increase in swelling Patient taking differently: Take 2.5 mg by mouth once a week. As needed for increase in swelling 06/14/16   Bensimhon, Shaune Pascal, MD  naproxen sodium (ANAPROX) 220 MG tablet Take 220 mg by mouth 2 (two) times daily as needed (back pain).    [provider]  nystatin cream (MYCOSTATIN) Apply 1 application topically 2 (two) times daily. Patient taking differently: Apply 1 application topically 2 (two) times daily as needed for dry skin (ON FEET).  01/05/17   Vicie Mutters, PA-C  penicillin v potassium (VEETID) 500 MG tablet Take 1 tablet (500 mg total) by mouth 4 (four) times daily for 7 days. 05/21/18 05/28/18  Tasia Catchings, Myrla Malanowski V,  PA-C  polyethylene glycol (MIRALAX / GLYCOLAX) packet Take 17 g by mouth daily as needed for moderate constipation.     [provider]  potassium chloride (K-DUR) 10 MEQ tablet TAKE 2 TABS TWICE DAILY TAKE ADDITIONAL 2 TABS IF TAKE ADDITIONAL LASIX 40 04/27/18   Vicie Mutters, PA-C  spironolactone (ALDACTONE) 25 MG tablet Take 0.5 tablets (12.5 mg total) by mouth daily. 05/20/17   Bensimhon, Shaune Pascal, MD  terbinafine (LAMISIL) 250 MG tablet TAKE 1 TABLET BY MOUTH EVERY DAY 04/12/18   Unk Pinto, MD  traZODone (DESYREL) 100 MG tablet Take 1 tablet at hour of Sleep 02/09/18   Vicie Mutters, PA-C  TRELEGY ELLIPTA 100-62.5-25 MCG/INH AEPB INHALE 1 PUFF INTO THE LUNGS ONCE A DAY 05/11/18   Unk Pinto, MD  triamcinolone cream (KENALOG) 0.5 % Apply 1 application  topically 2 (two) times daily. Patient taking differently: Apply 1 application topically 2 (two) times daily as needed (IRRITATION).  01/05/17   Vicie Mutters, PA-C  XARELTO 20 MG TABS tablet TAKE 1 TABLET (20 MG TOTAL) DAILY WITH SUPPER. NEEDS OFFICE VISIT FOR FURTHER REFILLS 02/23/18   Liane Comber, NP    Family History Family History  Problem Relation Age of Onset  . Colon cancer Mother   . Coronary artery disease Mother   . Coronary artery disease Father   . Asthma Father   . Emphysema Father   . Allergies Father   . Heart attack Maternal Grandfather   . Heart disease Maternal Grandmother   . Depression Daughter     Social History Social History   Tobacco Use  . Smoking status: Former Smoker    Packs/day: 2.00    Years: 35.00    Pack years: 70.00    Types: Cigarettes    Last attempt to quit: 09/08/2009    Years since quitting: 8.7  . Smokeless tobacco: Never Used  Substance Use Topics  . Alcohol use: No  . Drug use: No     Allergies   Sulfonamide derivatives and Tikosyn [dofetilide]   Review of Systems Review of Systems  Reason unable to perform ROS: See HPI as above.     Physical Exam  Triage Vital Signs ED Triage Vitals  Enc Vitals Group     BP 05/21/18 1700 121/83     Pulse Rate 05/21/18 1700 (!) 102     Resp 05/21/18 1700 17     Temp 05/21/18 1700 98.1 F (36.7 C)     Temp Source 05/21/18 1700 Oral     SpO2 05/21/18 1700 97 %     Weight --      Height --      Head Circumference --      Peak Flow --      Pain Score 05/21/18 1658 3     Pain Loc --      Pain Edu? --      Excl. in [place]? --    No data found.  Updated Vital Signs BP 121/83 (BP Location: Right Arm)   Pulse (!) 102   Temp 98.1 F (36.7 C) (Oral)   Resp 17   SpO2 97%    Physical Exam Constitutional:      General: She is not in acute distress.    Appearance: She is well-developed. She is not ill-appearing, toxic-appearing or diaphoretic.  HENT:     Head: Normocephalic and atraumatic.     Jaw: No trismus.     Right Ear: Tympanic membrane, ear canal and external ear normal. Tympanic membrane is not erythematous or bulging.     Left Ear: Tympanic membrane, ear canal and external ear normal. Tympanic membrane is not erythematous or bulging.     Mouth/Throat:     Mouth: Mucous membranes are moist.     Pharynx: Oropharynx is clear. Uvula midline. No uvula swelling.     Tonsils: No tonsillar exudate.      Comments: No obvious tenderness to palpation of the gums. No fluctuance felt.   Floor of mouth soft to palpation. No facial swelling.  Neck:     Musculoskeletal: Normal range of motion and neck supple.  Skin:    General: Skin is warm and dry.  Neurological:     Mental Status: She is alert and oriented to person, place, and time.    UC Treatments / Results  Labs (all labs ordered are listed, but only abnormal results are displayed) Labs Reviewed - No data to display  EKG None  Radiology No results found.  Procedures Procedures (including critical care time)  Medications Ordered in UC Medications - No data to display  Initial Impression / Assessment and Plan / UC Course  I  have reviewed the triage vital signs and the nursing notes.  Pertinent labs & imaging results that were available during my care of the patient were reviewed by me and considered in my medical decision making (see chart for details).    Start antibiotics for possible dental infection. Patient on xarelto for afib, to discontinue aleve. Will provide short course of norco as needed.  Discussed with patient symptoms can return if dental problem is not addressed. Follow up with dentist for further evaluation and treatment of dental pain. Resources given. Return precautions given.   Final Clinical Impressions(s) / UC Diagnoses   Final diagnoses:  Pain, dental    ED Prescriptions    Medication Sig Dispense Auth. Provider   penicillin v potassium (VEETID) 500 MG tablet Take 1 tablet (500 mg total) by mouth 4 (four) times daily for 7 days. 28 tablet Seferina Brokaw V, PA-C   HYDROcodone-acetaminophen (NORCO/VICODIN) 5-325 MG tablet Take 1 tablet by mouth every 6 (six) hours as needed for severe pain. 10 tablet Alizay Bronkema V, PA-C   chlorhexidine (PERIDEX) 0.12 % solution Use as directed 15 mLs in the mouth or throat 2 (two) times daily. 473 mL Cathlean Sauer V, PA-C     Controlled Substance Prescriptions [place] Controlled Substance Registry consulted? Yes, I have consulted the [place] Controlled Substances Registry for this patient, and feel the risk/benefit ratio today is favorable for proceeding with this prescription for a controlled substance.   Ok Edwards, PA-C 05/21/18 1725

## 2018-05-21 NOTE — Discharge Instructions (Signed)
Start penicillin as directed for dental infection. Norco as needed for pain. Peridex as directed for dental hygiene. Follow up with dentist for further treatment and evaluation. If experiencing swelling of the throat, trouble breathing, trouble swallowing, leaning forward to breath, drooling, go to the emergency department for further evaluation.

## 2018-05-21 NOTE — ED Triage Notes (Signed)
Patient presents to Urgent Care with complaints of dental pain. Patient states she has been trying to brush with baking soda to relieve pain, pain runs from ear to chin on the right side.

## 2018-05-22 ENCOUNTER — Other Ambulatory Visit (HOSPITAL_COMMUNITY): Payer: Self-pay | Admitting: Internal Medicine

## 2018-05-24 ENCOUNTER — Other Ambulatory Visit: Payer: Self-pay

## 2018-05-24 ENCOUNTER — Other Ambulatory Visit (INDEPENDENT_AMBULATORY_CARE_PROVIDER_SITE_OTHER): Payer: BC Managed Care – PPO

## 2018-05-24 DIAGNOSIS — Z130 Encounter for screening for diseases of the blood and blood-forming organs and certain disorders involving the immune mechanism: Secondary | ICD-10-CM

## 2018-05-24 DIAGNOSIS — N184 Chronic kidney disease, stage 4 (severe): Secondary | ICD-10-CM

## 2018-05-24 DIAGNOSIS — E785 Hyperlipidemia, unspecified: Secondary | ICD-10-CM

## 2018-05-24 DIAGNOSIS — E038 Other specified hypothyroidism: Secondary | ICD-10-CM

## 2018-05-24 DIAGNOSIS — I159 Secondary hypertension, unspecified: Secondary | ICD-10-CM

## 2018-05-24 DIAGNOSIS — E1165 Type 2 diabetes mellitus with hyperglycemia: Secondary | ICD-10-CM

## 2018-05-24 DIAGNOSIS — Z79899 Other long term (current) drug therapy: Secondary | ICD-10-CM

## 2018-05-24 DIAGNOSIS — E1122 Type 2 diabetes mellitus with diabetic chronic kidney disease: Secondary | ICD-10-CM

## 2018-05-25 LAB — CBC WITH DIFFERENTIAL/PLATELET
Absolute Monocytes: 515 cells/uL (ref 200–950)
Basophils Absolute: 31 cells/uL (ref 0–200)
Basophils Relative: 0.4 %
Eosinophils Absolute: 31 cells/uL (ref 15–500)
Eosinophils Relative: 0.4 %
HCT: 39.4 % (ref 35.0–45.0)
Hemoglobin: 13.1 g/dL (ref 11.7–15.5)
Lymphs Abs: 2012 cells/uL (ref 850–3900)
MCH: 29.1 pg (ref 27.0–33.0)
MCHC: 33.2 g/dL (ref 32.0–36.0)
MCV: 87.6 fL (ref 80.0–100.0)
MPV: 11.8 fL (ref 7.5–12.5)
Monocytes Relative: 6.6 %
Neutro Abs: 5210 cells/uL (ref 1500–7800)
Neutrophils Relative %: 66.8 %
Platelets: 256 10*3/uL (ref 140–400)
RBC: 4.5 10*6/uL (ref 3.80–5.10)
RDW: 13.7 % (ref 11.0–15.0)
Total Lymphocyte: 25.8 %
WBC: 7.8 10*3/uL (ref 3.8–10.8)

## 2018-05-25 LAB — HEMOGLOBIN A1C
Hgb A1c MFr Bld: 6.5 % of total Hgb — ABNORMAL HIGH (ref <5.7)
Mean Plasma Glucose: 140 (calc)
eAG (mmol/L): 7.7 (calc)

## 2018-05-25 LAB — TSH: TSH: 2.1 mIU/L (ref 0.40–4.50)

## 2018-05-25 LAB — COMPLETE METABOLIC PANEL WITH GFR
AG Ratio: 1.8 (calc) (ref 1.0–2.5)
ALT: 25 U/L (ref 6–29)
AST: 19 U/L (ref 10–35)
Albumin: 4 g/dL (ref 3.6–5.1)
Alkaline phosphatase (APISO): 72 U/L (ref 37–153)
BUN/Creatinine Ratio: 12 (calc) (ref 6–22)
BUN: 22 mg/dL (ref 7–25)
CO2: 28 mmol/L (ref 20–32)
Calcium: 9.7 mg/dL (ref 8.6–10.4)
Chloride: 103 mmol/L (ref 98–110)
Creat: 1.81 mg/dL — ABNORMAL HIGH (ref 0.50–1.05)
GFR, Est African American: 35 mL/min/{1.73_m2} — ABNORMAL LOW (ref >=60)
GFR, Est Non African American: 30 mL/min/{1.73_m2} — ABNORMAL LOW (ref >=60)
Globulin: 2.2 g/dL (calc) (ref 1.9–3.7)
Glucose, Bld: 104 mg/dL — ABNORMAL HIGH (ref 65–99)
Potassium: 5 mmol/L (ref 3.5–5.3)
Sodium: 141 mmol/L (ref 135–146)
Total Bilirubin: 0.3 mg/dL (ref 0.2–1.2)
Total Protein: 6.2 g/dL (ref 6.1–8.1)

## 2018-05-25 LAB — MAGNESIUM: Magnesium: 2.1 mg/dL (ref 1.5–2.5)

## 2018-05-25 LAB — LIPID PANEL
Cholesterol: 233 mg/dL — ABNORMAL HIGH (ref <200)
HDL: 40 mg/dL — ABNORMAL LOW (ref >=50)
LDL Cholesterol (Calc): 153 mg/dL (calc) — ABNORMAL HIGH
Non-HDL Cholesterol (Calc): 193 mg/dL (calc) — ABNORMAL HIGH (ref <130)
Total CHOL/HDL Ratio: 5.8 (calc) — ABNORMAL HIGH (ref <5.0)
Triglycerides: 237 mg/dL — ABNORMAL HIGH (ref <150)

## 2018-05-25 LAB — IRON, TOTAL/TOTAL IRON BINDING CAP
%SAT: 22 % (calc) (ref 16–45)
Iron: 76 ug/dL (ref 45–160)
TIBC: 348 mcg/dL (calc) (ref 250–450)

## 2018-05-25 LAB — FERRITIN: Ferritin: 86 ng/mL (ref 16–232)

## 2018-05-25 LAB — VITAMIN B12: Vitamin B-12: 241 pg/mL (ref 200–1100)

## 2018-05-25 MED ORDER — ROSUVASTATIN CALCIUM 10 MG PO TABS: 10.0000 mg | ORAL_TABLET | Freq: Every day | ORAL | 1 refills | Status: DC

## 2018-05-25 NOTE — Addendum Note (Signed)
Addended by: Vicie Mutters R on: 05/25/2018 08:40 AM   Modules accepted: Orders

## 2018-06-05 ENCOUNTER — Other Ambulatory Visit: Payer: Self-pay | Admitting: Physician Assistant

## 2018-06-05 DIAGNOSIS — M545 Low back pain, unspecified: Secondary | ICD-10-CM

## 2018-06-05 DIAGNOSIS — G8929 Other chronic pain: Secondary | ICD-10-CM

## 2018-06-07 ENCOUNTER — Other Ambulatory Visit: Payer: Self-pay | Admitting: Physician Assistant

## 2018-06-07 DIAGNOSIS — I5032 Chronic diastolic (congestive) heart failure: Secondary | ICD-10-CM

## 2018-06-13 DIAGNOSIS — G4733 Obstructive sleep apnea (adult) (pediatric): Secondary | ICD-10-CM | POA: Diagnosis not present

## 2018-06-14 DIAGNOSIS — G4733 Obstructive sleep apnea (adult) (pediatric): Secondary | ICD-10-CM | POA: Diagnosis not present

## 2018-06-21 ENCOUNTER — Other Ambulatory Visit (HOSPITAL_COMMUNITY): Payer: Self-pay | Admitting: Internal Medicine

## 2018-07-07 ENCOUNTER — Other Ambulatory Visit: Payer: Self-pay | Admitting: Internal Medicine

## 2018-07-15 DIAGNOSIS — G4733 Obstructive sleep apnea (adult) (pediatric): Secondary | ICD-10-CM | POA: Diagnosis not present

## 2018-07-20 ENCOUNTER — Other Ambulatory Visit: Payer: Self-pay | Admitting: Physician Assistant

## 2018-07-27 ENCOUNTER — Other Ambulatory Visit: Payer: Self-pay | Admitting: Physician Assistant

## 2018-08-11 ENCOUNTER — Other Ambulatory Visit: Payer: Self-pay | Admitting: Adult Health

## 2018-08-11 DIAGNOSIS — I5032 Chronic diastolic (congestive) heart failure: Secondary | ICD-10-CM

## 2018-08-14 DIAGNOSIS — G4733 Obstructive sleep apnea (adult) (pediatric): Secondary | ICD-10-CM | POA: Diagnosis not present

## 2018-08-15 ENCOUNTER — Other Ambulatory Visit: Payer: Self-pay | Admitting: Internal Medicine

## 2018-08-21 NOTE — Progress Notes (Signed)
Assessment and Plan:   Chronic diastolic heart failure ([place]) Continue follow up cardio Monitor weight daily Need to do better diet but medical literacy and financial inablity, as well as relying on her son and other to grocery shop for her make it very difficult. Went over how to read label, get frozen veggies over canned, avoid boxed/canned foods if possible.   Chronic atrial fibrillation (HCC) Rate controlled, continue medications, xarelto Has follow up with cardiology coming up  Secondary hypertension - continue medications, DASH diet, exercise and monitor at home. Call if greater than 130/80.  -     CBC with Differential/Platelet -     BASIC METABOLIC PANEL WITH GFR -     Hepatic function panel -     TSH  Chronic obstructive pulmonary disease, unspecified COPD type ([place]) TRELOGY, do nebulizer AS NEEDED, continue CPAP  Hyperlipidemia, unspecified hyperlipidemia type -check lipids, decrease fatty foods, increase activity.  -     Lipid panel  Morbid obesity (BMI 49.6) - long discussion about weight loss, diet, and exercise STOP SODA  OSA (obstructive sleep apnea) with COPD overlap syndrome On CPAP and doing better  Depression, unspecified depression type - continue medications, stress management techniques discussed, increase water, good sleep hygiene discussed, increase exercise, and increase veggies.   Medication management -     Magnesium   The patient was advised to call immediately if she has any concerning symptoms in the interval. The patient voices understanding of current treatment options and is in agreement with the current care plan.The patient knows to call the clinic with any problems, questions or concerns or go to the ER if any further progression of symptoms.   Patient walked out without getting labs, will contact and bring back for lab only.  Continue diet and meds as discussed. Discussed med's effects and SE's.  Future Appointments  Date Time Provider  [place]  11/08/2018  2:00 PM Vicie Mutters, PA-C [id number] None  11/27/2018  3:30 PM Dohmeier, Asencion Partridge, MD [facility] None    HPI 58 y.o. female  presents for 3 month follow up with hypertension, hyperlipidemia, diabetes and vitamin D. Her blood pressure has been controlled at home, today their BP is BP: 124/86  She does not workout. She denies chest pain, shortness of breath, dizziness.   Son has mental illness and had self inflicted gun shot wound earlier this year.   She has a history of afib and CHF,  following with Dr. Haroldine Laws. She is on bisoprolol 50m, diltiazem 360 mg, spirolactone 12.574m She is on lasix 2 a day, however today she has not had any and zaroxyln once a week and 8 potassium a day HOWEVER she sometime can not get that much.   Her weight is up, states it is from working at home and sitting for 7 hours a day. She admits to eating hot pockets, mac and cheese, chips. She has been using no salt rather than regular salt.  Wt Readings from Last 3 Encounters:  08/23/18 241 lb (109.3 kg)  04/11/18 237 lb 9.6 oz (107.8 kg)  02/09/18 238 lb 6.4 oz (108.1 kg)   She had AKI with CKD but kidney function had improved last OV, still advised to see nephrology.  Lab Results  Component Value Date   GFRNONAA 30 (L) 05/24/2018   She also has PHTN due to COPD and OSA, she is on CPAP.  She is on cholesterol medication, crestor 10 mg and denies myalgias. Her cholesterol is not at  goal. The cholesterol was: Lab Results  Component Value Date   CHOL 233 (H) 05/24/2018   HDL 40 (L) 05/24/2018   LDLCALC 153 (H) 05/24/2018   TRIG 237 (H) 05/24/2018   CHOLHDL 5.8 (H) 05/24/2018     She has been working on diet and exercise for Diabetes, and denies polydipsia, polyuria and visual disturbances. Last A1C  was:  Lab Results  Component Value Date   HGBA1C 6.5 (H) 05/24/2018   Patient is on Vitamin D supplement. She is on thyroid medication. Her medication was not changed last visit.   Lab Results  Component Value Date   TSH 2.10 05/24/2018  .  BMI is Body mass index is 48.68 kg/m.,  She has depression was started on celexa last OV that she states is helping.  Wt Readings from Last 3 Encounters:  08/23/18 241 lb (109.3 kg)  04/11/18 237 lb 9.6 oz (107.8 kg)  02/09/18 238 lb 6.4 oz (108.1 kg)     Current Medications:  Current Outpatient Medications on File Prior to Visit  Medication Sig  . albuterol (PROAIR HFA) 108 (90 Base) MCG/ACT inhaler Inhale 2 puffs into the lungs every 6 (six) hours as needed for wheezing or shortness of breath.  Marland Kitchen albuterol (PROVENTIL) (2.5 MG/3ML) 0.083% nebulizer solution Take 3 mLs (2.5 mg total) by nebulization every 6 (six) hours as needed for wheezing or shortness of breath.  . ALPRAZolam (XANAX) 0.25 MG tablet 1-2 tablets as needed BID for panic attack- Suggest follow up in next 2 weeks if need more medication.  Marland Kitchen aspirin-acetaminophen-caffeine (EXCEDRIN MIGRAINE) 250-250-65 MG per tablet Take 1 tablet by mouth every 6 (six) hours as needed for headache.  . bisoprolol (ZEBETA) 5 MG tablet Take 1 tablet by mouth once daily  . chlorhexidine (PERIDEX) 0.12 % solution Use as directed 15 mLs in the mouth or throat 2 (two) times daily.  . citalopram (CELEXA) 40 MG tablet TAKE 1 TABLET BY MOUTH EVERY DAY (Patient taking differently: Take 20 mg by mouth daily. )  . diltiazem (TIAZAC) 360 MG 24 hr capsule Take 1 capsule (360 mg total) by mouth daily.  . diphenhydrAMINE HCl, Sleep, (SLEEP AID) 50 MG CAPS Take 1 capsule by mouth daily.  . furosemide (LASIX) 40 MG tablet TAKE 2-3 TABLETS BY MOUTH EVERY DAY  . gabapentin (NEURONTIN) 300 MG capsule TAKE 1 CAPSULE BY MOUTH THREE TIMES A DAY  . HYDROcodone-acetaminophen (NORCO/VICODIN) 5-325 MG tablet Take 1 tablet by mouth every 6 (six) hours as needed for severe pain.  Marland Kitchen ipratropium-albuterol (DUONEB) 0.5-2.5 (3) MG/3ML SOLN TAKE 3 MLS BY NEBULIZATION EVERY 4 (FOUR) HOURS AS NEEDED. MAX:6 DOSES PER  DAY  . metolazone (ZAROXOLYN) 2.5 MG tablet As needed for increase in swelling (Patient taking differently: Take 2.5 mg by mouth once a week. As needed for increase in swelling)  . naproxen sodium (ANAPROX) 220 MG tablet Take 220 mg by mouth 2 (two) times daily as needed (back pain).  . nystatin cream (MYCOSTATIN) Apply 1 application topically 2 (two) times daily. (Patient taking differently: Apply 1 application topically 2 (two) times daily as needed for dry skin (ON FEET). )  . polyethylene glycol (MIRALAX / GLYCOLAX) packet Take 17 g by mouth daily as needed for moderate constipation.   . potassium chloride (K-DUR) 10 MEQ tablet TAKE 2 TABLETS BY MOUTH TWICE DAILY. TAKE ADDITIONAL 2 TABS IF TAKE ADDITIONAL LASIX 40MG  . rivaroxaban (XARELTO) 20 MG TABS tablet Take 1 tablet Daily to Prevent Blood Clots  .  rosuvastatin (CRESTOR) 10 MG tablet Take 1 tablet (10 mg total) by mouth at bedtime.  Marland Kitchen spironolactone (ALDACTONE) 25 MG tablet TAKE 0.5 TABLETS (12.5 MG TOTAL) BY MOUTH DAILY.  Marland Kitchen terbinafine (LAMISIL) 250 MG tablet TAKE 1 TABLET BY MOUTH EVERY DAY  . traZODone (DESYREL) 100 MG tablet Take 1 tablet at hour of Sleep  . TRELEGY ELLIPTA 100-62.5-25 MCG/INH AEPB INHALE 1 PUFF INTO THE LUNGS ONCE A DAY  . triamcinolone cream (KENALOG) 0.5 % Apply 1 application topically 2 (two) times daily. (Patient taking differently: Apply 1 application topically 2 (two) times daily as needed (IRRITATION). )   Current Facility-Administered Medications on File Prior to Visit  Medication  . dexamethasone (DECADRON) injection 10 mg  . ipratropium-albuterol (DUONEB) 0.5-2.5 (3) MG/3ML nebulizer solution 3 mL  . ipratropium-albuterol (DUONEB) 0.5-2.5 (3) MG/3ML nebulizer solution 3 mL   Medical History:  Past Medical History:  Diagnosis Date  . Anemia   . Anxiety   . Atrial flutter ([place])   . Borderline diabetes   . Chronic diastolic heart failure ([place])   . COPD (chronic obstructive pulmonary disease) ([place])    . Depression   . GERD (gastroesophageal reflux disease)   . HTN (hypertension)   . Hyperlipidemia   . Obstructive sleep apnea   . Persistent atrial fibrillation    Myoview 4/09: No ischemia;  echo 4/09: EF 60-65%;   Flecainide, beta blocker, Coumadin;    Unable to take Tikosyn 2/2 prolonged QT  . Pulmonary embolism (H[facility])    Right lower lobe diagnosed by CT 6/12  . Type II or unspecified type diabetes mellitus without mention of complication, not stated as uncontrolled    Allergies:  Allergies  Allergen Reactions  . Sulfonamide Derivatives Hives and Itching  . Tikosyn [Dofetilide]     Prolonged qt    Review of Systems:  See HPI  Family history- Review and unchanged Social history- Review and unchanged Physical Exam: BP 124/86   Pulse 64   Temp 97.7 F (36.5 C)   Ht _0  (1.499 m)   Wt 241 lb (109.3 kg)   SpO2 97%   BMI 48.68 kg/m  Wt Readings from Last 3 Encounters:  08/23/18 241 lb (109.3 kg)  04/11/18 237 lb 9.6 oz (107.8 kg)  02/09/18 238 lb 6.4 oz (108.1 kg)   General Appearance: Well nourished, obese, in no apparent distress. Eyes: PERRLA, EOMs, conjunctiva no swelling or erythema Sinuses: No Frontal/maxillary tenderness ENT/Mouth: Ext aud canals clear, TMs without erythema, bulging. No erythema, swelling, or exudate on post pharynx.Tonsils not swollen or erythematous. Hearing normal. Crowded mouth Neck: Supple, thyroid normal.  Respiratory: Decreased breath sounds bilateral, with wheezing and decreased breath sounds bilateral lower lobes Cardio: Irreg, irreg,  minimal edema Abdomen: Soft, + BS, obese,  Non tender, no guarding, rebound, hernias, masses. Lymphatics: Non tender without lymphadenopathy.  Musculoskeletal: Full ROM, 5/5 strength, antalgic gait Skin:   some thickening at toes, no ulcers. Warm, dry without rashes, lesions, ecchymosis.  Neuro: Cranial nerves intact. No cerebellar symptoms.  Psych: Awake and oriented X 3,  Insight and Judgment  appropriate.    Vicie Mutters, PA-C 4:28 PM [facility]

## 2018-08-23 ENCOUNTER — Ambulatory Visit: Payer: BLUE CROSS/BLUE SHIELD | Admitting: Physician Assistant

## 2018-08-23 ENCOUNTER — Encounter: Payer: Self-pay | Admitting: Physician Assistant

## 2018-08-23 ENCOUNTER — Other Ambulatory Visit: Payer: Self-pay

## 2018-08-23 VITALS — BP 124/86 | HR 64 | Temp 97.7°F | Ht 59.0 in | Wt 241.0 lb

## 2018-08-23 DIAGNOSIS — Z79899 Other long term (current) drug therapy: Secondary | ICD-10-CM

## 2018-08-23 DIAGNOSIS — E1122 Type 2 diabetes mellitus with diabetic chronic kidney disease: Secondary | ICD-10-CM | POA: Diagnosis not present

## 2018-08-23 DIAGNOSIS — E559 Vitamin D deficiency, unspecified: Secondary | ICD-10-CM

## 2018-08-23 DIAGNOSIS — E1165 Type 2 diabetes mellitus with hyperglycemia: Secondary | ICD-10-CM

## 2018-08-23 DIAGNOSIS — I2723 Pulmonary hypertension due to lung diseases and hypoxia: Secondary | ICD-10-CM

## 2018-08-23 DIAGNOSIS — I5033 Acute on chronic diastolic (congestive) heart failure: Secondary | ICD-10-CM

## 2018-08-23 DIAGNOSIS — J449 Chronic obstructive pulmonary disease, unspecified: Secondary | ICD-10-CM

## 2018-08-23 DIAGNOSIS — F331 Major depressive disorder, recurrent, moderate: Secondary | ICD-10-CM

## 2018-08-23 DIAGNOSIS — I4891 Unspecified atrial fibrillation: Secondary | ICD-10-CM

## 2018-08-23 DIAGNOSIS — N184 Chronic kidney disease, stage 4 (severe): Secondary | ICD-10-CM

## 2018-08-23 DIAGNOSIS — G4733 Obstructive sleep apnea (adult) (pediatric): Secondary | ICD-10-CM

## 2018-08-23 DIAGNOSIS — E785 Hyperlipidemia, unspecified: Secondary | ICD-10-CM | POA: Diagnosis not present

## 2018-08-23 DIAGNOSIS — I159 Secondary hypertension, unspecified: Secondary | ICD-10-CM

## 2018-08-23 DIAGNOSIS — E038 Other specified hypothyroidism: Secondary | ICD-10-CM

## 2018-08-23 NOTE — Patient Instructions (Addendum)
INFORMATION ABOUT CBD OIL  The studies for CBD are better for seizure disorders than any other claims for CBD oil.  The biggest issues with it, is CBD oil is NOT regulated. A recent test of 18 over the counter CBD oil showed that 20% had TOO much, 60 % did not have the claimed amount, and 20 % had the claimed amount.   I have had some patients where it interacts with their medications as well and there is not a way for me to check the interactions since it is not a controlled substance.   With any other the counter medication OR supplement, if you try it, try to get from good source and if you have ANY abnormal symptoms over the next 3 months or don't see any benefits from it stop it.   The quality and quanity of the CBD oil varies greatly so I can not endorse patients taking it at this time.   VENOUS INSUFFICIENCY Our lower leg venous system is not the most reliable, the heart does NOT pump fluid up, there is a valve system.  The muscles of the leg squeeze and the blood moves up and a valve opens and close, then they squeeze, blood moves up and valves open and closes keeping the blood moving towards the heart.  Lots can go wrong with this valve system.  If someone is sitting or standing without movement, everyone will get swelling.  THINGS TO DO:  Do not stand or sit in one position for long periods of time. Do not sit with your legs crossed. Rest with your legs raised during the day.  Your legs have to be higher than your heart so that gravity will force the valves to open, so please really elevate your legs.   Wear elastic stockings or support hose. Do not wear other tight, encircling garments around the legs, pelvis, or waist.  [employer]  has a wide variety of well priced compression stockings. [place], [place] [address] [id number] has a good cheap selection, I like the socks, they are not as hard to get on  Walk as much as possible to increase  blood flow.  Raise the foot of your bed at night with 2-inch blocks.  SEEK MEDICAL CARE IF:   The skin around your ankle starts to break down.  You have pain, redness, tenderness, or hard swelling developing in your leg over a vein.  You are uncomfortable due to leg pain.  If you ever have shortness of breath with exertion or chest pain go to the ER.   Low-Sodium Eating Plan Sodium, which is an element that makes up salt, helps you maintain a healthy balance of fluids in your body. Too much sodium can increase your blood pressure and cause fluid and waste to be held in your body. Your health care provider or dietitian may recommend following this plan if you have high blood pressure (hypertension), kidney disease, liver disease, or heart failure. Eating less sodium can help lower your blood pressure, reduce swelling, and protect your heart, liver, and kidneys. What are tips for following this plan? General guidelines  Most people on this plan should limit their sodium intake to 1,500-2,000 mg (milligrams) of sodium each day. Reading food labels   The Nutrition Facts label lists the amount of sodium in one serving of the food. If you eat more than one serving, you must multiply the listed amount of sodium by the  number of servings.  Choose foods with less than 140 mg of sodium per serving.  Avoid foods with 300 mg of sodium or more per serving. Shopping  Look for lower-sodium products, often labeled as "low-sodium" or "no salt added."  Always check the sodium content even if foods are labeled as "unsalted" or "no salt added".  Buy fresh foods. ? Avoid canned foods and premade or frozen meals. ? Avoid canned, cured, or processed meats  Buy breads that have less than 80 mg of sodium per slice. Cooking  Eat more home-cooked food and less restaurant, buffet, and fast food.  Avoid adding salt when cooking. Use salt-free seasonings or herbs instead of table salt or sea salt.  Check with your health care provider or pharmacist before using salt substitutes.  Cook with plant-based oils, such as canola, sunflower, or olive oil. Meal planning  When eating at a restaurant, ask that your food be prepared with less salt or no salt, if possible.  Avoid foods that contain MSG (monosodium glutamate). MSG is sometimes added to [place] food, bouillon, and some canned foods. What foods are recommended? The items listed may not be a complete list. Talk with your dietitian about what dietary choices are best for you. Grains Low-sodium cereals, including oats, puffed wheat and rice, and shredded wheat. Low-sodium crackers. Unsalted rice. Unsalted pasta. Low-sodium bread. Whole-grain breads and whole-grain pasta. Vegetables Fresh or frozen vegetables. "No salt added" canned vegetables. "No salt added" tomato sauce and paste. Low-sodium or reduced-sodium tomato and vegetable juice. Fruits Fresh, frozen, or canned fruit. Fruit juice. Meats and other protein foods Fresh or frozen (no salt added) meat, poultry, seafood, and fish. Low-sodium canned tuna and salmon. Unsalted nuts. Dried peas, beans, and lentils without added salt. Unsalted canned beans. Eggs. Unsalted nut butters. Dairy Milk. Soy milk. Cheese that is naturally low in sodium, such as ricotta cheese, fresh mozzarella, or Swiss cheese Low-sodium or reduced-sodium cheese. Cream cheese. Yogurt. Fats and oils Unsalted butter. Unsalted margarine with no trans fat. Vegetable oils such as canola or olive oils. Seasonings and other foods Fresh and dried herbs and spices. Salt-free seasonings. Low-sodium mustard and ketchup. Sodium-free salad dressing. Sodium-free light mayonnaise. Fresh or refrigerated horseradish. Lemon juice. Vinegar. Homemade, reduced-sodium, or low-sodium soups. Unsalted popcorn and pretzels. Low-salt or salt-free chips. What foods are not recommended? The items listed may not be a complete list. Talk with  your dietitian about what dietary choices are best for you. Grains Instant hot cereals. Bread stuffing, pancake, and biscuit mixes. Croutons. Seasoned rice or pasta mixes. Noodle soup cups. Boxed or frozen macaroni and cheese. Regular salted crackers. Self-rising flour. Vegetables Sauerkraut, pickled vegetables, and relishes. Olives. [place] fries. Onion rings. Regular canned vegetables (not low-sodium or reduced-sodium). Regular canned tomato sauce and paste (not low-sodium or reduced-sodium). Regular tomato and vegetable juice (not low-sodium or reduced-sodium). Frozen vegetables in sauces. Meats and other protein foods Meat or fish that is salted, canned, smoked, spiced, or pickled. Bacon, ham, sausage, hotdogs, corned beef, chipped beef, packaged lunch meats, salt pork, jerky, pickled herring, anchovies, regular canned tuna, sardines, salted nuts. Dairy Processed cheese and cheese spreads. Cheese curds. Blue cheese. Feta cheese. String cheese. Regular cottage cheese. Buttermilk. Canned milk. Fats and oils Salted butter. Regular margarine. Ghee. Bacon fat. Seasonings and other foods Onion salt, garlic salt, seasoned salt, table salt, and sea salt. Canned and packaged gravies. Worcestershire sauce. Tartar sauce. Barbecue sauce. Teriyaki sauce. Soy sauce, including reduced-sodium. Steak sauce. Fish sauce. [employer]  sauce. Cocktail sauce. Horseradish that you find on the shelf. Regular ketchup and mustard. Meat flavorings and tenderizers. Bouillon cubes. Hot sauce and Tabasco sauce. Premade or packaged marinades. Premade or packaged taco seasonings. Relishes. Regular salad dressings. Salsa. Potato and tortilla chips. Corn chips and puffs. Salted popcorn and pretzels. Canned or dried soups. Pizza. Frozen entrees and pot pies. Summary  Eating less sodium can help lower your blood pressure, reduce swelling, and protect your heart, liver, and kidneys.  Most people on this plan should limit their sodium  intake to 1,500-2,000 mg (milligrams) of sodium each day.  Canned, boxed, and frozen foods are high in sodium. Restaurant foods, fast foods, and pizza are also very high in sodium. You also get sodium by adding salt to food.  Try to cook at home, eat more fresh fruits and vegetables, and eat less fast food, canned, processed, or prepared foods. This information is not intended to replace advice given to you by your health care provider. Make sure you discuss any questions you have with your health care provider. Document Released: 07/17/2001 Document Revised: 01/07/2017 Document Reviewed: 01/19/2016 Elsevier Patient Education  2020 [employer].

## 2018-08-24 LAB — CBC WITH DIFFERENTIAL/PLATELET
Absolute Monocytes: 602 cells/uL (ref 200–950)
Basophils Absolute: 17 cells/uL (ref 0–200)
Basophils Relative: 0.2 %
Eosinophils Absolute: 112 cells/uL (ref 15–500)
Eosinophils Relative: 1.3 %
HCT: 40 % (ref 35.0–45.0)
Hemoglobin: 13.5 g/dL (ref 11.7–15.5)
Lymphs Abs: 2116 cells/uL (ref 850–3900)
MCH: 28.9 pg (ref 27.0–33.0)
MCHC: 33.8 g/dL (ref 32.0–36.0)
MCV: 85.7 fL (ref 80.0–100.0)
MPV: 11.6 fL (ref 7.5–12.5)
Monocytes Relative: 7 %
Neutro Abs: 5753 cells/uL (ref 1500–7800)
Neutrophils Relative %: 66.9 %
Platelets: 249 10*3/uL (ref 140–400)
RBC: 4.67 10*6/uL (ref 3.80–5.10)
RDW: 13.2 % (ref 11.0–15.0)
Total Lymphocyte: 24.6 %
WBC: 8.6 10*3/uL (ref 3.8–10.8)

## 2018-08-24 LAB — COMPLETE METABOLIC PANEL WITH GFR
AG Ratio: 1.8 (calc) (ref 1.0–2.5)
ALT: 24 U/L (ref 6–29)
AST: 16 U/L (ref 10–35)
Albumin: 4.4 g/dL (ref 3.6–5.1)
Alkaline phosphatase (APISO): 88 U/L (ref 37–153)
BUN/Creatinine Ratio: 18 (calc) (ref 6–22)
BUN: 28 mg/dL — ABNORMAL HIGH (ref 7–25)
CO2: 34 mmol/L — ABNORMAL HIGH (ref 20–32)
Calcium: 9.8 mg/dL (ref 8.6–10.4)
Chloride: 98 mmol/L (ref 98–110)
Creat: 1.59 mg/dL — ABNORMAL HIGH (ref 0.50–1.05)
GFR, Est African American: 41 mL/min/{1.73_m2} — ABNORMAL LOW (ref >=60)
GFR, Est Non African American: 35 mL/min/{1.73_m2} — ABNORMAL LOW (ref >=60)
Globulin: 2.4 g/dL (calc) (ref 1.9–3.7)
Glucose, Bld: 128 mg/dL — ABNORMAL HIGH (ref 65–99)
Potassium: 3.2 mmol/L — ABNORMAL LOW (ref 3.5–5.3)
Sodium: 141 mmol/L (ref 135–146)
Total Bilirubin: 0.5 mg/dL (ref 0.2–1.2)
Total Protein: 6.8 g/dL (ref 6.1–8.1)

## 2018-08-24 LAB — MAGNESIUM: Magnesium: 2 mg/dL (ref 1.5–2.5)

## 2018-08-24 LAB — LIPID PANEL
Cholesterol: 137 mg/dL (ref <200)
HDL: 43 mg/dL — ABNORMAL LOW (ref >=50)
LDL Cholesterol (Calc): 70 mg/dL (calc)
Non-HDL Cholesterol (Calc): 94 mg/dL (calc) (ref <130)
Total CHOL/HDL Ratio: 3.2 (calc) (ref <5.0)
Triglycerides: 159 mg/dL — ABNORMAL HIGH (ref <150)

## 2018-08-24 LAB — TSH: TSH: 2.87 mIU/L (ref 0.40–4.50)

## 2018-08-24 LAB — HEMOGLOBIN A1C
Hgb A1c MFr Bld: 6.6 % of total Hgb — ABNORMAL HIGH (ref <5.7)
Mean Plasma Glucose: 143 (calc)
eAG (mmol/L): 7.9 (calc)

## 2018-09-04 ENCOUNTER — Other Ambulatory Visit: Payer: Self-pay | Admitting: Internal Medicine

## 2018-09-04 ENCOUNTER — Other Ambulatory Visit: Payer: Self-pay | Admitting: Physician Assistant

## 2018-09-14 DIAGNOSIS — G4733 Obstructive sleep apnea (adult) (pediatric): Secondary | ICD-10-CM | POA: Diagnosis not present

## 2018-09-28 ENCOUNTER — Telehealth: Payer: Self-pay | Admitting: Physician Assistant

## 2018-09-28 MED ORDER — POTASSIUM CHLORIDE ER 10 MEQ PO TBCR: EXTENDED_RELEASE_TABLET | ORAL | 0 refills | Status: DC

## 2018-09-28 NOTE — Telephone Encounter (Signed)
-----  Message from Elenor Quinones, [place] sent at 09/28/2018 12:40 PM EDT ----- Regarding: med refill [facility] request/ Patient request   New rx for KCL

## 2018-09-30 ENCOUNTER — Other Ambulatory Visit: Payer: Self-pay | Admitting: Internal Medicine

## 2018-09-30 MED ORDER — POTASSIUM CHLORIDE ER 10 MEQ PO TBCR: EXTENDED_RELEASE_TABLET | ORAL | 1 refills | Status: DC

## 2018-10-04 NOTE — Progress Notes (Signed)
Assessment and Plan:   CHF NYHA class III (symptoms with mildly strenuous activities), acute on chronic, diastolic (HCC) Continue medications, go home and take lasix LONG discussion about diet and sodium content Cut back on fast food Get scale, see if can get from insurance or order from [employer]  Atrial fibrillation, unspecified type ([place]) Continue meds  Secondary hypertension - continue medications, DASH diet, exercise and monitor at home. Call if greater than 130/80.   Uncontrolled type 2 diabetes mellitus with hyperglycemia (HCC) Continue farxiga 5 mg daily for now, with some vaginal itching, do not want to increase dose to risk mycotic infection  CKD stage 4 due to type 2 diabetes mellitus ([place]) Recheck today  Medication management -     COMPLETE METABOLIC PANEL WITH GFR -     CBC with Differential/Platelet -     Magnesium     The patient was advised to call immediately if she has any concerning symptoms in the interval. The patient voices understanding of current treatment options and is in agreement with the current care plan.The patient knows to call the clinic with any problems, questions or concerns or go to the ER if any further progression of symptoms.   Patient walked out without getting labs, will contact and bring back for lab only.  Continue diet and meds as discussed. Discussed med's effects and SE's.  Future Appointments  Date Time Provider [place]  11/27/2018  3:30 PM Dohmeier, Asencion Partridge, MD [facility] None  11/30/2018 10:00 AM Vicie Mutters, PA-C [id number] None    HPI 58 y.o. female  presents for 1 month follow up for medication change.   She has history of Afib, CHF, and diabetes with CKD, she follows with Dr. Haroldine Laws. She is on bisoprolol 25m, diltiazem 360 mg, spirolactone 12.577m She is on lasix 2 a day,zaroxyln once a week and 8 potassium a day but she admits that she often does not get that many. Her potassium was low last visit but she is  back on her potassium.  She admits to poor diet, had a chicken biscuit this AM, hamburger yesterday, weight is up but she has not had the lasix today, will take when she gets home.  She is in the DM range and farxiga 5 mg samples was given to her for diabetes and CHF management. She is complaining of vaginal itching since taking the medication but denies discharge or urinary symptoms.  Lab Results  Component Value Date   CREATININE 1.59 (H) 08/23/2018   BUN 28 (H) 08/23/2018   NA 141 08/23/2018   K 3.2 (L) 08/23/2018   CL 98 08/23/2018   CO2 34 (H) 08/23/2018   Lab Results  Component Value Date   HGBA1C 6.6 (H) 08/23/2018   BMI is Body mass index is 49.69 kg/m., she is working on diet and exercise. Wt Readings from Last 3 Encounters:  10/09/18 246 lb (111.6 kg)  08/23/18 241 lb (109.3 kg)  04/11/18 237 lb 9.6 oz (107.8 kg)   She had AKI with CKD but kidney function had improved last OV, still advised to see nephrology.  Lab Results  Component Value Date   GFRNONAA 35 (L) 08/23/2018      Current Medications:  Current Outpatient Medications on File Prior to Visit  Medication Sig  . albuterol (PROAIR HFA) 108 (90 Base) MCG/ACT inhaler Inhale 2 puffs into the lungs every 6 (six) hours as needed for wheezing or shortness of breath.  . Marland Kitchenlbuterol (PROVENTIL) (2.5 MG/3ML) 0.083% nebulizer  solution Take 3 mLs (2.5 mg total) by nebulization every 6 (six) hours as needed for wheezing or shortness of breath.  . ALPRAZolam (XANAX) 0.25 MG tablet 1-2 tablets as needed BID for panic attack- Suggest follow up in next 2 weeks if need more medication.  Marland Kitchen aspirin-acetaminophen-caffeine (EXCEDRIN MIGRAINE) 250-250-65 MG per tablet Take 1 tablet by mouth every 6 (six) hours as needed for headache.  . bisoprolol (ZEBETA) 5 MG tablet Take 1 tablet by mouth once daily  . chlorhexidine (PERIDEX) 0.12 % solution Use as directed 15 mLs in the mouth or throat 2 (two) times daily.  . citalopram (CELEXA)  40 MG tablet TAKE 1 TABLET BY MOUTH EVERY DAY  . diltiazem (TIAZAC) 360 MG 24 hr capsule Take 1 capsule (360 mg total) by mouth daily.  . diphenhydrAMINE HCl, Sleep, (SLEEP AID) 50 MG CAPS Take 1 capsule by mouth daily.  . furosemide (LASIX) 40 MG tablet TAKE 2-3 TABLETS BY MOUTH EVERY DAY  . gabapentin (NEURONTIN) 300 MG capsule TAKE 1 CAPSULE BY MOUTH THREE TIMES A DAY  . HYDROcodone-acetaminophen (NORCO/VICODIN) 5-325 MG tablet Take 1 tablet by mouth every 6 (six) hours as needed for severe pain.  Marland Kitchen ipratropium-albuterol (DUONEB) 0.5-2.5 (3) MG/3ML SOLN TAKE 3 MLS BY NEBULIZATION EVERY 4 (FOUR) HOURS AS NEEDED. MAX:6 DOSES PER DAY  . metolazone (ZAROXOLYN) 2.5 MG tablet As needed for increase in swelling (Patient taking differently: Take 2.5 mg by mouth once a week. As needed for increase in swelling)  . naproxen sodium (ANAPROX) 220 MG tablet Take 220 mg by mouth 2 (two) times daily as needed (back pain).  . nystatin cream (MYCOSTATIN) Apply 1 application topically 2 (two) times daily. (Patient taking differently: Apply 1 application topically 2 (two) times daily as needed for dry skin (ON FEET). )  . polyethylene glycol (MIRALAX / GLYCOLAX) packet Take 17 g by mouth daily as needed for moderate constipation.   . potassium chloride (K-DUR) 10 MEQ tablet Take 2 tablets 2 x/day for BP & Fluid Retention & take 2 extra tabs if take extra dose Lasix  . rivaroxaban (XARELTO) 20 MG TABS tablet Take 1 tablet Daily to Prevent Blood Clots  . rosuvastatin (CRESTOR) 10 MG tablet Take 1 tablet (10 mg total) by mouth at bedtime.  Marland Kitchen spironolactone (ALDACTONE) 25 MG tablet TAKE 0.5 TABLETS (12.5 MG TOTAL) BY MOUTH DAILY.  Marland Kitchen terbinafine (LAMISIL) 250 MG tablet TAKE 1 TABLET BY MOUTH EVERY DAY  . traZODone (DESYREL) 100 MG tablet Take 1 tablet at hour of Sleep  . TRELEGY ELLIPTA 100-62.5-25 MCG/INH AEPB INHALE 1 PUFF INTO THE LUNGS ONCE A DAY  . triamcinolone cream (KENALOG) 0.5 % Apply 1 application topically 2  (two) times daily. (Patient taking differently: Apply 1 application topically 2 (two) times daily as needed (IRRITATION). )   Current Facility-Administered Medications on File Prior to Visit  Medication  . dexamethasone (DECADRON) injection 10 mg  . ipratropium-albuterol (DUONEB) 0.5-2.5 (3) MG/3ML nebulizer solution 3 mL  . ipratropium-albuterol (DUONEB) 0.5-2.5 (3) MG/3ML nebulizer solution 3 mL   Medical History:  Past Medical History:  Diagnosis Date  . Anemia   . Anxiety   . Atrial flutter ([place])   . Borderline diabetes   . Chronic diastolic heart failure ([place])   . COPD (chronic obstructive pulmonary disease) ([place])   . Depression   . GERD (gastroesophageal reflux disease)   . HTN (hypertension)   . Hyperlipidemia   . Obstructive sleep apnea   . Persistent atrial  fibrillation    Myoview 4/09: No ischemia;  echo 4/09: EF 60-65%;   Flecainide, beta blocker, Coumadin;    Unable to take Tikosyn 2/2 prolonged QT  . Pulmonary embolism (HCC)    Right lower lobe diagnosed by CT 6/12  . Type II or unspecified type diabetes mellitus without mention of complication, not stated as uncontrolled    Allergies:  Allergies  Allergen Reactions  . Sulfonamide Derivatives Hives and Itching  . Tikosyn [Dofetilide]     Prolonged qt    Review of Systems:  See HPI  Family history- Review and unchanged Social history- Review and unchanged Physical Exam: BP 128/86   Pulse 67   Temp 97.7 F (36.5 C)   Ht _0  (1.499 m)   Wt 246 lb (111.6 kg)   SpO2 98%   BMI 49.69 kg/m  Wt Readings from Last 3 Encounters:  10/09/18 246 lb (111.6 kg)  08/23/18 241 lb (109.3 kg)  04/11/18 237 lb 9.6 oz (107.8 kg)   General Appearance: Well nourished, obese, in no apparent distress. Eyes: PERRLA, EOMs, conjunctiva no swelling or erythema Sinuses: No Frontal/maxillary tenderness ENT/Mouth: Ext aud canals clear, TMs without erythema, bulging. No erythema, swelling, or exudate on post pharynx.Tonsils not  swollen or erythematous. Hearing normal. Crowded mouth Neck: Supple, thyroid normal.  Respiratory: Decreased breath sounds bilateral, with wheezing and decreased breath sounds bilateral lower lobes Cardio: Irreg, irreg,  minimal edema Abdomen: Soft, + BS, obese,  Non tender, no guarding, rebound, hernias, masses. Lymphatics: Non tender without lymphadenopathy.  Musculoskeletal: Full ROM, 5/5 strength, antalgic gait Skin:   some thickening at toes, no ulcers. Warm, dry without rashes, lesions, ecchymosis.  Neuro: Cranial nerves intact. No cerebellar symptoms.  Psych: Awake and oriented X 3,  Insight and Judgment appropriate.    Vicie Mutters, PA-C 2:36 PM [facility]

## 2018-10-09 ENCOUNTER — Other Ambulatory Visit: Payer: Self-pay

## 2018-10-09 ENCOUNTER — Ambulatory Visit: Payer: BC Managed Care – PPO | Admitting: Physician Assistant

## 2018-10-09 ENCOUNTER — Encounter: Payer: Self-pay | Admitting: Physician Assistant

## 2018-10-09 VITALS — BP 128/86 | HR 67 | Temp 97.7°F | Ht 59.0 in | Wt 246.0 lb

## 2018-10-09 DIAGNOSIS — G4733 Obstructive sleep apnea (adult) (pediatric): Secondary | ICD-10-CM | POA: Diagnosis not present

## 2018-10-09 DIAGNOSIS — I5033 Acute on chronic diastolic (congestive) heart failure: Secondary | ICD-10-CM

## 2018-10-09 DIAGNOSIS — I4891 Unspecified atrial fibrillation: Secondary | ICD-10-CM | POA: Diagnosis not present

## 2018-10-09 DIAGNOSIS — E1165 Type 2 diabetes mellitus with hyperglycemia: Secondary | ICD-10-CM | POA: Diagnosis not present

## 2018-10-09 DIAGNOSIS — N184 Chronic kidney disease, stage 4 (severe): Secondary | ICD-10-CM

## 2018-10-09 DIAGNOSIS — I159 Secondary hypertension, unspecified: Secondary | ICD-10-CM

## 2018-10-09 DIAGNOSIS — E1122 Type 2 diabetes mellitus with diabetic chronic kidney disease: Secondary | ICD-10-CM

## 2018-10-09 DIAGNOSIS — Z79899 Other long term (current) drug therapy: Secondary | ICD-10-CM | POA: Diagnosis not present

## 2018-10-09 NOTE — Patient Instructions (Signed)
See if you can get a scale from your insurance  Please weigh yourself daily  Write it down and keep it in a log.  Eat low salt foods-Limit salt (sodium) to 2000 mg per day.  Best thing to do is avoid processed foods.     Call your doctor if:  Anytime you have any of the following symptoms:  1) 2 pound weight gain in 24 hours or 5 pounds in 1 week  2) shortness of breath, with or without a dry hacking cough  3) swelling in the hands, LEGs, feet or stomach  4) if you have to sleep on extra pillows at night in order to breathe. 5) after laying down at night for 20-30 mins, you wake up short of breath.   These can all be signs of fluid overload.    Heart Failure Heart failure means your heart has trouble pumping blood. This makes it hard for your body to work well. Heart failure is usually a long-term (chronic) condition. You must take good care of yourself and follow your doctor's treatment plan. Follow these instructions at home:  Take your heart medicine as told by your doctor. ? Do not stop taking medicine unless your doctor tells you to. ? Do not skip any dose of medicine. ? Refill your medicines before they run out. ? Take other medicines only as told by your doctor or pharmacist.  Stay active if told by your doctor. The elderly and people with severe heart failure should talk with a doctor about physical activity.  Eat heart-healthy foods. Choose foods that are without trans fat and are low in saturated fat, cholesterol, and salt (sodium). This includes fresh or frozen fruits and vegetables, fish, lean meats, fat-free or low-fat dairy foods, whole grains, and high-fiber foods. Lentils and dried peas and beans (legumes) are also good choices.  Limit salt if told by your doctor.  Cook in a healthy way. Roast, grill, broil, bake, poach, steam, or stir-fry foods.  Limit fluids as told by your doctor.  Weigh yourself every morning. Do this after you pee (urinate) and before you  eat breakfast. Write down your weight to give to your doctor.  Take your blood pressure and write it down if your doctor tells you to.  Ask your doctor how to check your pulse. Check your pulse as told.  Lose weight if told by your doctor.  Stop smoking or chewing tobacco. Do not use gum or patches that help you quit without your doctor's approval.  Schedule and go to doctor visits as told.  Nonpregnant women should have no more than 1 drink a day. Men should have no more than 2 drinks a day. Talk to your doctor about drinking alcohol.  Stop illegal drug use.  Stay current with shots (immunizations).  Manage your health conditions as told by your doctor.  Learn to manage your stress.  Rest when you are tired.  If it is really hot outside: ? Avoid intense activities. ? Use air conditioning or fans, or get in a cooler place. ? Avoid caffeine and alcohol. ? Wear loose-fitting, lightweight, and light-colored clothing.  If it is really cold outside: ? Avoid intense activities. ? Layer your clothing. ? Wear mittens or gloves, a hat, and a scarf when going outside. ? Avoid alcohol.  Learn about heart failure and get support as needed.  Get help to maintain or improve your quality of life and your ability to care for yourself as needed. Contact  a doctor if:  You gain weight quickly.  You are more short of breath than usual.  You cannot do your normal activities.  You tire easily.  You cough more than normal, especially with activity.  You have any or more puffiness (swelling) in areas such as your hands, feet, ankles, or belly (abdomen).  You cannot sleep because it is hard to breathe.  You feel like your heart is beating fast (palpitations).  You get dizzy or light-headed when you stand up. Get help right away if:  You have trouble breathing.  There is a change in mental status, such as becoming less alert or not being able to focus.  You have chest pain or  discomfort.  You faint. This information is not intended to replace advice given to you by your health care provider. Make sure you discuss any questions you have with your health care provider. Document Released: 11/04/2007 Document Revised: 07/03/2015 Document Reviewed: 03/13/2012 Elsevier Interactive Patient Education  2017 [employer].

## 2018-10-10 LAB — COMPLETE METABOLIC PANEL WITH GFR
AG Ratio: 1.8 (calc) (ref 1.0–2.5)
ALT: 22 U/L (ref 6–29)
AST: 13 U/L (ref 10–35)
Albumin: 4.1 g/dL (ref 3.6–5.1)
Alkaline phosphatase (APISO): 96 U/L (ref 37–153)
BUN/Creatinine Ratio: 13 (calc) (ref 6–22)
BUN: 23 mg/dL (ref 7–25)
CO2: 28 mmol/L (ref 20–32)
Calcium: 9.1 mg/dL (ref 8.6–10.4)
Chloride: 109 mmol/L (ref 98–110)
Creat: 1.76 mg/dL — ABNORMAL HIGH (ref 0.50–1.05)
GFR, Est African American: 36 mL/min/{1.73_m2} — ABNORMAL LOW (ref >=60)
GFR, Est Non African American: 31 mL/min/{1.73_m2} — ABNORMAL LOW (ref >=60)
Globulin: 2.3 g/dL (calc) (ref 1.9–3.7)
Glucose, Bld: 86 mg/dL (ref 65–99)
Potassium: 4 mmol/L (ref 3.5–5.3)
Sodium: 145 mmol/L (ref 135–146)
Total Bilirubin: 0.3 mg/dL (ref 0.2–1.2)
Total Protein: 6.4 g/dL (ref 6.1–8.1)

## 2018-10-10 LAB — CBC WITH DIFFERENTIAL/PLATELET
Absolute Monocytes: 537 cells/uL (ref 200–950)
Basophils Absolute: 27 cells/uL (ref 0–200)
Basophils Relative: 0.4 %
Eosinophils Absolute: 68 cells/uL (ref 15–500)
Eosinophils Relative: 1 %
HCT: 38.7 % (ref 35.0–45.0)
Hemoglobin: 12.8 g/dL (ref 11.7–15.5)
Lymphs Abs: 1659 cells/uL (ref 850–3900)
MCH: 28.6 pg (ref 27.0–33.0)
MCHC: 33.1 g/dL (ref 32.0–36.0)
MCV: 86.4 fL (ref 80.0–100.0)
MPV: 11.9 fL (ref 7.5–12.5)
Monocytes Relative: 7.9 %
Neutro Abs: 4508 cells/uL (ref 1500–7800)
Neutrophils Relative %: 66.3 %
Platelets: 231 10*3/uL (ref 140–400)
RBC: 4.48 10*6/uL (ref 3.80–5.10)
RDW: 14 % (ref 11.0–15.0)
Total Lymphocyte: 24.4 %
WBC: 6.8 10*3/uL (ref 3.8–10.8)

## 2018-10-10 LAB — MAGNESIUM: Magnesium: 2.1 mg/dL (ref 1.5–2.5)

## 2018-10-14 ENCOUNTER — Other Ambulatory Visit: Payer: Self-pay | Admitting: Physician Assistant

## 2018-10-15 DIAGNOSIS — G4733 Obstructive sleep apnea (adult) (pediatric): Secondary | ICD-10-CM | POA: Diagnosis not present

## 2018-10-21 ENCOUNTER — Other Ambulatory Visit: Payer: Self-pay | Admitting: Physician Assistant

## 2018-10-21 DIAGNOSIS — I5032 Chronic diastolic (congestive) heart failure: Secondary | ICD-10-CM

## 2018-10-23 ENCOUNTER — Other Ambulatory Visit: Payer: Self-pay | Admitting: Physician Assistant

## 2018-11-08 ENCOUNTER — Encounter: Payer: Self-pay | Admitting: Physician Assistant

## 2018-11-09 ENCOUNTER — Other Ambulatory Visit: Payer: Self-pay | Admitting: Physician Assistant

## 2018-11-09 DIAGNOSIS — F329 Major depressive disorder, single episode, unspecified: Secondary | ICD-10-CM

## 2018-11-09 DIAGNOSIS — F32A Depression, unspecified: Secondary | ICD-10-CM

## 2018-11-14 DIAGNOSIS — G4733 Obstructive sleep apnea (adult) (pediatric): Secondary | ICD-10-CM | POA: Diagnosis not present

## 2018-11-27 ENCOUNTER — Encounter: Payer: Self-pay | Admitting: Neurology

## 2018-11-27 ENCOUNTER — Other Ambulatory Visit: Payer: Self-pay

## 2018-11-27 ENCOUNTER — Ambulatory Visit: Payer: BLUE CROSS/BLUE SHIELD | Admitting: Neurology

## 2018-11-27 VITALS — BP 114/72 | HR 75 | Temp 97.7°F | Ht 59.0 in | Wt 244.0 lb

## 2018-11-27 DIAGNOSIS — I4819 Other persistent atrial fibrillation: Secondary | ICD-10-CM | POA: Diagnosis not present

## 2018-11-27 DIAGNOSIS — E038 Other specified hypothyroidism: Secondary | ICD-10-CM

## 2018-11-27 DIAGNOSIS — G4733 Obstructive sleep apnea (adult) (pediatric): Secondary | ICD-10-CM | POA: Insufficient documentation

## 2018-11-27 DIAGNOSIS — Z6841 Body Mass Index (BMI) 40.0 and over, adult: Secondary | ICD-10-CM

## 2018-11-27 DIAGNOSIS — J449 Chronic obstructive pulmonary disease, unspecified: Secondary | ICD-10-CM

## 2018-11-27 DIAGNOSIS — I5033 Acute on chronic diastolic (congestive) heart failure: Secondary | ICD-10-CM | POA: Diagnosis not present

## 2018-11-27 DIAGNOSIS — Z9989 Dependence on other enabling machines and devices: Secondary | ICD-10-CM

## 2018-11-27 NOTE — Patient Instructions (Signed)
DASH Eating Plan DASH stands for "Dietary Approaches to Stop Hypertension." The DASH eating plan is a healthy eating plan that has been shown to reduce high blood pressure (hypertension). It may also reduce your risk for type 2 diabetes, heart disease, and stroke. The DASH eating plan may also help with weight loss. What are tips for following this plan?  General guidelines  Avoid eating more than 2,300 mg (milligrams) of salt (sodium) a day. If you have hypertension, you may need to reduce your sodium intake to 1,500 mg a day.  Limit alcohol intake to no more than 1 drink a day for nonpregnant women and 2 drinks a day for men. One drink equals 12 oz of beer, 5 oz of wine, or 1 oz of hard liquor.  Work with your health care provider to maintain a healthy body weight or to lose weight. Ask what an ideal weight is for you.  Get at least 30 minutes of exercise that causes your heart to beat faster (aerobic exercise) most days of the week. Activities may include walking, swimming, or biking.  Work with your health care provider or diet and nutrition specialist (dietitian) to adjust your eating plan to your individual calorie needs. Reading food labels   Check food labels for the amount of sodium per serving. Choose foods with less than 5 percent of the Daily Value of sodium. Generally, foods with less than 300 mg of sodium per serving fit into this eating plan.  To find whole grains, look for the word "whole" as the first word in the ingredient list. Shopping  Buy products labeled as "low-sodium" or "no salt added."  Buy fresh foods. Avoid canned foods and premade or frozen meals. Cooking  Avoid adding salt when cooking. Use salt-free seasonings or herbs instead of table salt or sea salt. Check with your health care provider or pharmacist before using salt substitutes.  Do not fry foods. Cook foods using healthy methods such as baking, boiling, grilling, and broiling instead.  Cook with  heart-healthy oils, such as olive, canola, soybean, or sunflower oil. Meal planning  Eat a balanced diet that includes: ? 5 or more servings of fruits and vegetables each day. At each meal, try to fill half of your plate with fruits and vegetables. ? Up to 6-8 servings of whole grains each day. ? Less than 6 oz of lean meat, poultry, or fish each day. A 3-oz serving of meat is about the same size as a deck of cards. One egg equals 1 oz. ? 2 servings of low-fat dairy each day. ? A serving of nuts, seeds, or beans 5 times each week. ? Heart-healthy fats. Healthy fats called Omega-3 fatty acids are found in foods such as flaxseeds and coldwater fish, like sardines, salmon, and mackerel.  Limit how much you eat of the following: ? Canned or prepackaged foods. ? Food that is high in trans fat, such as fried foods. ? Food that is high in saturated fat, such as fatty meat. ? Sweets, desserts, sugary drinks, and other foods with added sugar. ? Full-fat dairy products.  Do not salt foods before eating.  Try to eat at least 2 vegetarian meals each week.  Eat more home-cooked food and less restaurant, buffet, and fast food.  When eating at a restaurant, ask that your food be prepared with less salt or no salt, if possible. What foods are recommended? The items listed may not be a complete list. Talk with your dietitian about  what dietary choices are best for you. Grains Whole-grain or whole-wheat bread. Whole-grain or whole-wheat pasta. Brown rice. [place]. Bulgur. Whole-grain and low-sodium cereals. Pita bread. Low-fat, low-sodium crackers. Whole-wheat flour tortillas. Vegetables Fresh or frozen vegetables (raw, steamed, roasted, or grilled). Low-sodium or reduced-sodium tomato and vegetable juice. Low-sodium or reduced-sodium tomato sauce and tomato paste. Low-sodium or reduced-sodium canned vegetables. Fruits All fresh, dried, or frozen fruit. Canned fruit in natural juice (without  added sugar). Meat and other protein foods Skinless chicken or [place]. Ground chicken or [place]. Pork with fat trimmed off. Fish and seafood. Egg whites. Dried beans, peas, or lentils. Unsalted nuts, nut butters, and seeds. Unsalted canned beans. Lean cuts of beef with fat trimmed off. Low-sodium, lean deli meat. Dairy Low-fat (1%) or fat-free (skim) milk. Fat-free, low-fat, or reduced-fat cheeses. Nonfat, low-sodium ricotta or cottage cheese. Low-fat or nonfat yogurt. Low-fat, low-sodium cheese. Fats and oils Soft margarine without trans fats. Vegetable oil. Low-fat, reduced-fat, or light mayonnaise and salad dressings (reduced-sodium). Canola, safflower, olive, soybean, and sunflower oils. Avocado. Seasoning and other foods Herbs. Spices. Seasoning mixes without salt. Unsalted popcorn and pretzels. Fat-free sweets. What foods are not recommended? The items listed may not be a complete list. Talk with your dietitian about what dietary choices are best for you. Grains Baked goods made with fat, such as croissants, muffins, or some breads. Dry pasta or rice meal packs. Vegetables Creamed or fried vegetables. Vegetables in a cheese sauce. Regular canned vegetables (not low-sodium or reduced-sodium). Regular canned tomato sauce and paste (not low-sodium or reduced-sodium). Regular tomato and vegetable juice (not low-sodium or reduced-sodium). Angie Fava. Olives. Fruits Canned fruit in a light or heavy syrup. Fried fruit. Fruit in cream or butter sauce. Meat and other protein foods Fatty cuts of meat. Ribs. Fried meat. Berniece Salines. Sausage. Bologna and other processed lunch meats. Salami. Fatback. Hotdogs. Bratwurst. Salted nuts and seeds. Canned beans with added salt. Canned or smoked fish. Whole eggs or egg yolks. Chicken or [place] with skin. Dairy Whole or 2% milk, cream, and half-and-half. Whole or full-fat cream cheese. Whole-fat or sweetened yogurt. Full-fat cheese. Nondairy creamers. Whipped toppings.  Processed cheese and cheese spreads. Fats and oils Butter. Stick margarine. Lard. Shortening. Ghee. Bacon fat. Tropical oils, such as coconut, palm kernel, or palm oil. Seasoning and other foods Salted popcorn and pretzels. Onion salt, garlic salt, seasoned salt, table salt, and sea salt. Worcestershire sauce. Tartar sauce. Barbecue sauce. Teriyaki sauce. Soy sauce, including reduced-sodium. Steak sauce. Canned and packaged gravies. Fish sauce. Oyster sauce. Cocktail sauce. Horseradish that you find on the shelf. Ketchup. Mustard. Meat flavorings and tenderizers. Bouillon cubes. Hot sauce and Tabasco sauce. Premade or packaged marinades. Premade or packaged taco seasonings. Relishes. Regular salad dressings. Where to find more information:  National Heart, Lung, and [place]: https://wilson-eaton.com/  American Heart Association: www.heart.org Summary  The DASH eating plan is a healthy eating plan that has been shown to reduce high blood pressure (hypertension). It may also reduce your risk for type 2 diabetes, heart disease, and stroke.  With the DASH eating plan, you should limit salt (sodium) intake to 2,300 mg a day. If you have hypertension, you may need to reduce your sodium intake to 1,500 mg a day.  When on the DASH eating plan, aim to eat more fresh fruits and vegetables, whole grains, lean proteins, low-fat dairy, and heart-healthy fats.  Work with your health care provider or diet and nutrition specialist (dietitian) to adjust your eating plan to your  individual calorie needs. This information is not intended to replace advice given to you by your health care provider. Make sure you discuss any questions you have with your health care provider. Document Released: 01/14/2011 Document Revised: 01/07/2017 Document Reviewed: 01/19/2016 Elsevier Patient Education  2020 [employer].

## 2018-11-27 NOTE — Progress Notes (Signed)
Complete Physical  Assessment and Plan:  Chronic atrial fibrillation Continue medications, rate controlled, follow up cardio  Secondary hypertension - continue medications, DASH diet, exercise and monitor at home. Call if greater than 130/80.  -     CBC with Differential/Platelet  Other pulmonary embolism without acute cor pulmonale, unspecified chronicity ([place]) Stay on CPAP, continue xarelto  Atrial flutter, unspecified type ([place]) Continue medications, rate controlled, follow up cardio  Pulmonary hypertension due to COPD ([place]) Stay on CPAP, weight loss advised  CHF NYHA class III (symptoms with mildly strenuous activities), acute on chronic, diastolic (HCC) Follow up with cardio, monitor weight daily, weight loss advised  OSA and COPD overlap syndrome ([place]) Stay on CPAP, weight loss advised  Chronic obstructive pulmonary disease, unspecified COPD type ([place]) Continue inhalers  Other specified hypothyroidism Hypothyroidism-check TSH level, continue medications the same, reminded to take on an empty stomach 30-83mns before food.  -     TSH  Uncontrolled type 2 diabetes mellitus with hyperglycemia (HCC) Discussed general issues about diabetes pathophysiology and management., Educational material distributed., Suggested low cholesterol diet., Encouraged aerobic exercise., Discussed foot care., Reminded to get yearly retinal exam. -     COMPLETE METABOLIC PANEL WITH GFR -     Hemoglobin A1c  CKD stage 4 due to type 2 diabetes mellitus ([place] Discussed general issues about diabetes pathophysiology and management., Educational material distributed., Suggested low cholesterol diet., Encouraged aerobic exercise., Discussed foot care., Reminded to get yearly retinal exam. -     COMPLETE METABOLIC PANEL WITH GFR -     Hemoglobin A1c  Depression, major, recurrent, moderate (HCC) - continue medications, stress management techniques discussed, increase water, good sleep hygiene  discussed, increase exercise, and increase veggies.   Hyperlipidemia, unspecified hyperlipidemia type check lipids decrease fatty foods increase activity.  -     Lipid panel  Morbid obesity (BMI 49.6) - follow up 3 months for progress monitoring - increase veggies, decrease carbs - long discussion about weight loss, diet, and exercise  Vitamin D deficiency -     VITAMIN D 25 Hydroxy (Vit-D Deficiency, Fractures)  Anemia, unspecified type -     CBC with Differential/Platelet -     Iron,Total/Total Iron Binding Cap  Anxiety - monitor, continue iron supp with Vitamin C and increase green leafy veggies  Gastroesophageal reflux disease, esophagitis presence not specified Continue PPI/H2 blocker, diet discussed  Noncompliance Doing better  Encounter for general adult medical examination with abnormal findings 1 year- get MGM and colonoscopy  CKD (chronic kidney disease) stage 4, GFR 15-29 ml/min (HCC) avoid NSAIDS, monitor sugars, will monitor -     COMPLETE METABOLIC PANEL WITH GFR -     Urinalysis, Routine w reflex microscopic -     Microalbumin / creatinine urine ratio  Medication management -     Magnesium  Family history of colon cancer - OVERDUE, has constipation as well, get on miralax and needs colonoscopy - will call her insurance if it covers cologuard, I have discussed with patient that with her history I prefer a colonoscopy however she has several co morbidities too so any screening at this time is a good screening.   Chronic bilateral low back pain without sciatica -     Continue meds, weight loss is encouarged  Discussed med's effects and SE's. Screening labs and tests as requested with regular follow-up as recommended. Over 40 minutes of exam, counseling, chart review, and complex, high level critical decision making was performed this visit.  Future  Appointments  Date Time Provider [place]  11/28/2019  2:00 PM Ward Givens, NP [facility] None   12/03/2019 10:00 AM Vicie Mutters, PA-C [id number] None    HPI  58 y.o. female  presents for a complete physical and follow up for has Depression, major, recurrent, moderate ([place]); OSA and COPD overlap syndrome ([place]); G E R D; CHF NYHA class III (symptoms with mildly strenuous activities), acute on chronic, diastolic ([place]); History of pulmonary embolus (PE); Atrial flutter (HCC); COPD (chronic obstructive pulmonary disease) ([place]); Atrial fibrillation ([place]); Hypothyroid; Hyperlipidemia; HTN (hypertension); Diabetes type 2, uncontrolled ([place]); Anxiety; Anemia; Vitamin D deficiency; Morbid obesity (BMI 49.6); Noncompliance; CKD stage 4 due to type 2 diabetes mellitus ([place]); Pulmonary hypertension due to COPD [facility]); CKD (chronic kidney disease) stage 4, GFR 15-29 ml/min ([place]); Class 3 severe obesity due to excess calories with serious comorbidity and body mass index (BMI) of 45.0 to 49.9 in adult [facility]); and Obstructive sleep apnea treated with continuous positive airway pressure (CPAP) on their problem list..  Her blood pressure has been controlled at home, today their BP is BP: 124/86 She does not workout. She denies chest pain, shortness of breath, dizziness.   She has a history of afib and CHF, she is on amiodarone, cardizem, she is on xarleto,spriolactone 1/2 at night, following with Dr. Haroldine Laws.    She is on lasix 2 a day and zaroxyln 1 x a week and 8 potassium a day.  BMI is Body mass index is 49.48 kg/m., she is working on diet and exercise. Admits that she has been slightly depressed with politics and the virus and she has been eating more candy.  Wt Readings from Last 3 Encounters:  11/30/18 245 lb (111.1 kg)  11/27/18 244 lb (110.7 kg)  10/09/18 246 lb (111.6 kg)    She has history of CKD and hypokalemia.  Lab Results  Component Value Date   GFRNONAA 31 (L) 10/09/2018   Lab Results  Component Value Date   CREATININE 1.76 (H) 10/09/2018   BUN 23 10/09/2018   NA 145 10/09/2018    K 4.0 10/09/2018   CL 109 10/09/2018   CO2 28 10/09/2018   She also has PHTN due to COPD and OSA, she is on CPAP. Needs something to clean her CPAP due to COPD to decrease risk of infection. She is on aerocare.   She is not on cholesterol medication and denies myalgias. Her cholesterol is not at goal. The cholesterol last visit was:   Lab Results  Component Value Date   CHOL 137 08/23/2018   HDL 43 (L) 08/23/2018   LDLCALC 70 08/23/2018   TRIG 159 (H) 08/23/2018   CHOLHDL 3.2 08/23/2018    She has been working on diet and exercise for diabetes, she is on bASA, she is on ACE/ARB and denies paresthesia of the feet, polydipsia, polyuria and visual disturbances. Last A1C in the office was:  Lab Results  Component Value Date   HGBA1C 6.6 (H) 08/23/2018   She is on thyroid medication. Her medication was not changed last visit.   Lab Results  Component Value Date   TSH 2.87 08/23/2018   She is on celexa for depression.  Patient is on Vitamin D supplement and now calcium pills, states she has less back pain.   Lab Results  Component Value Date   VD25OH 33 11/07/2017      Current Medications:  Current Outpatient Medications on File Prior to Visit  Medication Sig Dispense Refill  .  albuterol (PROAIR HFA) 108 (90 Base) MCG/ACT inhaler Inhale 2 puffs into the lungs every 6 (six) hours as needed for wheezing or shortness of breath. 8 g 1  . albuterol (PROVENTIL) (2.5 MG/3ML) 0.083% nebulizer solution Take 3 mLs (2.5 mg total) by nebulization every 6 (six) hours as needed for wheezing or shortness of breath. 75 mL 12  . ALPRAZolam (XANAX) 0.25 MG tablet 1-2 tablets as needed BID for panic attack- Suggest follow up in next 2 weeks if need more medication. 60 tablet 0  . aspirin-acetaminophen-caffeine (EXCEDRIN MIGRAINE) 364-680-32 MG per tablet Take 1 tablet by mouth every 6 (six) hours as needed for headache.    . bisoprolol (ZEBETA) 5 MG tablet Take 1 tablet Daily for BP 90 tablet 1  .  chlorhexidine (PERIDEX) 0.12 % solution Use as directed 15 mLs in the mouth or throat 2 (two) times daily. 473 mL 0  . citalopram (CELEXA) 40 MG tablet TAKE 1 TABLET BY MOUTH EVERY DAY 90 tablet 0  . diltiazem (TIAZAC) 360 MG 24 hr capsule Take 1 capsule (360 mg total) by mouth daily. 90 capsule 1  . diphenhydrAMINE HCl, Sleep, (SLEEP AID) 50 MG CAPS Take 1 capsule by mouth daily.    . furosemide (LASIX) 40 MG tablet Take  2  to 3 tablets  Daily as Directed for BP & Fluid Swelling 270 tablet 1  . gabapentin (NEURONTIN) 300 MG capsule TAKE 1 CAPSULE BY MOUTH THREE TIMES A DAY 270 capsule 1  . HYDROcodone-acetaminophen (NORCO/VICODIN) 5-325 MG tablet Take 1 tablet by mouth every 6 (six) hours as needed for severe pain. 10 tablet 0  . ipratropium-albuterol (DUONEB) 0.5-2.5 (3) MG/3ML SOLN TAKE 3 MLS BY NEBULIZATION EVERY 4 (FOUR) HOURS AS NEEDED. MAX:6 DOSES PER DAY 540 mL 0  . metolazone (ZAROXOLYN) 2.5 MG tablet As needed for increase in swelling (Patient taking differently: Take 2.5 mg by mouth once a week. As needed for increase in swelling) 90 tablet 3  . naproxen sodium (ANAPROX) 220 MG tablet Take 220 mg by mouth 2 (two) times daily as needed (back pain).    . nystatin cream (MYCOSTATIN) Apply 1 application topically 2 (two) times daily. (Patient taking differently: Apply 1 application topically 2 (two) times daily as needed for dry skin (ON FEET). ) 30 g 1  . polyethylene glycol (MIRALAX / GLYCOLAX) packet Take 17 g by mouth daily as needed for moderate constipation.     . potassium chloride (K-DUR) 10 MEQ tablet Take 2 tablets 2 x /day for Potassium & take additional 2 tablets with additional dose of Lasix 360 tablet 3  . rivaroxaban (XARELTO) 20 MG TABS tablet Take 1 tablet Daily to Prevent Blood Clots 90 tablet 1  . spironolactone (ALDACTONE) 25 MG tablet TAKE 0.5 TABLETS (12.5 MG TOTAL) BY MOUTH DAILY. 45 tablet 1  . terbinafine (LAMISIL) 250 MG tablet TAKE 1 TABLET BY MOUTH EVERY DAY 90  tablet 0  . traZODone (DESYREL) 100 MG tablet TAKE 1 TABLET AT HOUR OF SLEEP 90 tablet 1  . TRELEGY ELLIPTA 100-62.5-25 MCG/INH AEPB INHALE 1 PUFF INTO THE LUNGS ONCE A DAY 180 each 1  . triamcinolone cream (KENALOG) 0.5 % Apply 1 application topically 2 (two) times daily. (Patient taking differently: Apply 1 application topically 2 (two) times daily as needed (IRRITATION). ) 80 g 2   Current Facility-Administered Medications on File Prior to Visit  Medication Dose Route Frequency Provider Last Rate Last Dose  . dexamethasone (DECADRON) injection 10 mg  10 mg Intramuscular Once McClanahan, Kyra, NP      . ipratropium-albuterol (DUONEB) 0.5-2.5 (3) MG/3ML nebulizer solution 3 mL  3 mL Nebulization Once Vicie Mutters, PA-C      . ipratropium-albuterol (DUONEB) 0.5-2.5 (3) MG/3ML nebulizer solution 3 mL  3 mL Nebulization Once Garnet Sierras, NP       Allergies:  Allergies  Allergen Reactions  . Sulfonamide Derivatives Hives and Itching  . Tikosyn [Dofetilide]     Prolonged qt   Medical History:  She has Depression, major, recurrent, moderate ([place]); OSA and COPD overlap syndrome ([place]); G E R D; CHF NYHA class III (symptoms with mildly strenuous activities), acute on chronic, diastolic ([place]); History of pulmonary embolus (PE); Atrial flutter (HCC); COPD (chronic obstructive pulmonary disease) ([place]); Atrial fibrillation ([place]); Hypothyroid; Hyperlipidemia; HTN (hypertension); Diabetes type 2, uncontrolled ([place]); Anxiety; Anemia; Vitamin D deficiency; Morbid obesity (BMI 49.6); Noncompliance; CKD stage 4 due to type 2 diabetes mellitus ([place]); Pulmonary hypertension due to COPD [facility]); CKD (chronic kidney disease) stage 4, GFR 15-29 ml/min ([place]); Class 3 severe obesity due to excess calories with serious comorbidity and body mass index (BMI) of 45.0 to 49.9 in adult [facility]); and Obstructive sleep apnea treated with continuous positive airway pressure (CPAP) on their problem list.   Health Maintenance:    Immunization History  Administered Date(s) Administered  . Influenza Inj Mdck Quad With Preservative 11/30/2018  . Td 02/08/2001  . Tdap 11/07/2017    Tetanus: 2019 Pneumovax: will get next time Prevnar 13:  Flu vaccine: TODAY Zostavax:  Pap: 2010 declines at this time MGM: 2006 OVERDUE DEXA: age 20 Colonoscopy: 2003 colon cancer in her mom late 26's passed age 53- states can not do the colonoscopy due to health, will check on cologuard- I have told her the colonoscopy is a better test but something is better than nothing.   Patient Care Team: Unk Pinto, MD as PCP - General (Internal Medicine) Lelon Perla, MD as Consulting Physician (Cardiology)  Surgical History:  She has a past surgical history that includes Tubal ligation (1996); TEE without cardioversion (01/19/2012); Cardioversion (01/19/2012); Cardioversion (01/21/2012); and basal skin can. Family History:  Herfamily history includes Allergies in her father; Asthma in her father; Colon cancer in her mother; Coronary artery disease in her father and mother; Depression in her daughter; Emphysema in her father; Heart attack in her maternal grandfather; Heart disease in her maternal grandmother. Social History:  She reports that she quit smoking about 9 years ago. Her smoking use included cigarettes. She has a 70.00 pack-year smoking history. She has never used smokeless tobacco. She reports that she does not drink alcohol or use drugs.  Review of Systems: Review of Systems  Constitutional: Negative.   HENT: Negative.   Eyes: Negative.   Respiratory: Negative.   Cardiovascular: Negative.   Gastrointestinal: Negative.   Genitourinary: Negative.   Musculoskeletal: Negative.   Skin: Negative.    See HPI  Physical Exam: Estimated body mass index is 49.48 kg/m as calculated from the following:   Height as of this encounter: _0  (1.499 m).   Weight as of this encounter: 245 lb (111.1 kg). BP 124/86    Pulse 68   Temp 97.7 F (36.5 C)   Ht _1  (1.499 m)   Wt 245 lb (111.1 kg)   SpO2 97%   BMI 49.48 kg/m  General Appearance: Well nourished, obese, in no apparent distress. Eyes: PERRLA, EOMs, conjunctiva no swelling or erythema Sinuses: No Frontal/maxillary tenderness  ENT/Mouth: Ext aud canals clear, TMs without erythema, bulging. No erythema, swelling, or exudate on post pharynx.Tonsils not swollen or erythematous. Hearing normal. Crowded mouth Neck: Supple, thyroid normal.  Respiratory: Decreased breath sounds bilateral, with wheezing and decreased breath sounds bilateral lower lobes Cardio: Irreg, irreg,  minimal edema Abdomen: Soft, + BS, obese,  Non tender, no guarding, rebound, hernias, masses. Lymphatics: Non tender without lymphadenopathy.  Musculoskeletal: Full ROM, 5/5 strength, antalgic gait Skin: well healing scar on left nostril, bilateral feet with white fungus, some thickening at toes, no ulcers. Warm, dry without rashes, lesions, ecchymosis.  Neuro: Cranial nerves intact. No cerebellar symptoms.  Psych: Awake and oriented X 3,  Insight and Judgment appropriate.   Vicie Mutters 10:21 AM [facility]

## 2018-11-27 NOTE — Progress Notes (Signed)
[facility]  PATIENT: [name] DOB: 11-20-1960   REASON FOR VISIT: Follow-up for obstructive sleep apnea  CPAP compliance check HISTORY FROM: Patient    HISTORY OF PRESENT ILLNESS:UPDATE   I have the pleasure to see here on 27 November 2018 [name],  a  58 year old Caucasian female patient is a history of obstructive sleep apnea using CPAP.  She also had a bout of atrial fibrillation in October 2018 she was admitted to the hospital and has been on Xarelto for anticoagulation.  She further takes gabapentin Zaroxolyn, Xanax as needed.Zebeta, albuterol inhaler as needed ipratropium albuterol as needed in case her  COPD exacerbates.  The patient has been 100% compliant with her CPAP also she confessed it took her several weeks to like it.  She now feels dependent on it she does not get good sleep without it.  Her average usage time is 10 hours 19 minutes percent pressure is still 12 cmH2O with 3 cm EPR AHI is 1.00.7 of these apneas being obstructive in origin.  She still sometimes snores or snorts 3 the CPAP which is indicated in the rare index 1.4 she does have minimal air leakage.  Based on these findings I did not need to change any of her settings but I like to talk to her about her diet and how to restrict carbohydrates and preprocessed foods.D   10/14/2019CM [name], 58 year old female returns for follow-up with a history of obstructive sleep apnea here for CPAP compliance.  Data dated 10/22/2017-11/20/2017 shows compliance greater than 4 hours at 100% for 30 days.  Average usage 9 hours 20 minutes.  Set pressure 12 cm EPR level 3 leak 95th percentile 2.9.  AHI 0.5.  ESS 6.  She returns for reevaluation.   UPDATE 4/10/2019CM [name], 58 year old female returns for follow-up.  Obstructive sleep apnea and is here for CPAP compliance.  Data dated 04/17/2017 2 05/16/2017 shows compliance greater than 4 hours at 100%.  Average usage 9 hours 16  minutes.  Set pressure of 12 cm EPR 3 AHI 0.3 ESS 5.  She is having no problems with her machine.  She returns for reevaluation  UPDATE 10/08/2018CM [name], 58 year old female returns for follow-up. She has obstructive sleep apnea. Sleep study found patient has severe obstructive sleep apnea. This is her first CPAP compliance check. Data dated 10/16/2016- 11/14/2016 shows compliance greater than 4 hours for 23 days for 77%. Average usage 9 hours 33 minutes. Set pressure 12 cm. EPR 3. AHI 0.7. Minimal leak ESS 5 returns for reevaluation    08/04/16 CDDonna K Medina is a 58 y.o. female , seen here as in a referral from Dr. Idell Pickles PA Vicie Mutters.  This is my first encounter with [name] on 08/04/2016. The patient reports that she was diagnosed with sleep apnea at an AHI of 77, at the time at [address]. This sleep evaluation was initiated after her daughter had witnessed the patient having apnea during a hospitalization, she stayed at her bedside. The patient has an extensive and significant medical history I would like to introduce her past and current medical history as it makes the evaluation of sleep apnea urgent. She has chronic diastolic heart failure with water retention, shortness of breath, and orthopnea. She has chronic atrial fibrillation but is rate controlled, she has secondary hypertension she has chronic obstructive pulmonary disease and requires nebulizers and breathing treatments. She is considered super obese at a  BMI of 50. CKD grade 4 with a GFR of 20.   [name] has presented with fatigue and weight gain and her primary care physician once urgently to put her back on CPAP. Her cardiologist is Dr. Britta Mccreedy long at the heart failure clinic, who is equally interested in getting her back on positive airway pressure especially since COPD overlap is also present. I would like to add that the patient experienced pulmonary embolism  during an attack of atrial flutter and that she has hypothyroidism as well, anemia and poorly controlled diabetes type 2. Her review of symptoms is positive for weight gain and fatigue palpitations leg swelling hearing loss and tinnitus constipation joint pain aching muscles, weakness, numbness, confusion, dizziness, sleepiness, decreased energy and is interested activities.   Sleep habits are as follows: The patient takes 166mg of trazodone to allow her to sleep, otherwise she states her mind is racing and she cannot find a way to sleep. This is around 8 PM when she takes the medication, bedtime is around 9:30 PM and the medication has started to work. She stays asleep for about 2-3 hours -her sleep is fragmented by bathroom breaks up to 4 at night. She looks much better in a chair or with her back of the bed elevated that in bed. And her sleep usually lasts longer and is less fragmented. But her legs swell more. She does report vivid dreams. She does not report palpitations, diaphoresis, nausea or dizziness at night. When she sleeps in bed she sleeps on her side, on multiple pillows for head support. Her alarm rings at 6:30 and she usually is ready to leave the house by 7.15 AM.     REVIEW OF SYSTEMS: Full 14 system review of systems performed and notable only for those listed, all others are neg:   Working from home since March since gained more weight .  Palpitations.  Hematology/Lymphatic: Easy bruising Psychiatric: Anxiety Sleep : Obstructive sleep apnea with CPAP  ALLERGIES: Allergies  Allergen Reactions  . Sulfonamide Derivatives Hives and Itching  . Tikosyn [Dofetilide]     Prolonged qt    HOME MEDICATIONS: Outpatient Medications Prior to Visit  Medication Sig Dispense Refill  . albuterol (PROAIR HFA) 108 (90 Base) MCG/ACT inhaler Inhale 2 puffs into the lungs every 6 (six) hours as needed for wheezing or shortness of breath. 8 g 1  . albuterol (PROVENTIL) (2.5 MG/3ML)  0.083% nebulizer solution Take 3 mLs (2.5 mg total) by nebulization every 6 (six) hours as needed for wheezing or shortness of breath. 75 mL 12  . ALPRAZolam (XANAX) 0.25 MG tablet 1-2 tablets as needed BID for panic attack- Suggest follow up in next 2 weeks if need more medication. 60 tablet 0  . aspirin-acetaminophen-caffeine (EXCEDRIN MIGRAINE) 2701-100-34MG per tablet Take 1 tablet by mouth every 6 (six) hours as needed for headache.    . bisoprolol (ZEBETA) 5 MG tablet Take 1 tablet Daily for BP 90 tablet 1  . chlorhexidine (PERIDEX) 0.12 % solution Use as directed 15 mLs in the mouth or throat 2 (two) times daily. 473 mL 0  . citalopram (CELEXA) 40 MG tablet TAKE 1 TABLET BY MOUTH EVERY DAY 90 tablet 0  . diltiazem (TIAZAC) 360 MG 24 hr capsule Take 1 capsule (360 mg total) by mouth daily. 90 capsule 1  . diphenhydrAMINE HCl, Sleep, (SLEEP AID) 50 MG CAPS Take 1 capsule by mouth daily.    . furosemide (LASIX) 40 MG tablet Take  2  to 3 tablets  Daily as Directed for BP & Fluid Swelling 270 tablet 1  . gabapentin (NEURONTIN) 300 MG capsule TAKE 1 CAPSULE BY MOUTH THREE TIMES A DAY 270 capsule 1  . HYDROcodone-acetaminophen (NORCO/VICODIN) 5-325 MG tablet Take 1 tablet by mouth every 6 (six) hours as needed for severe pain. 10 tablet 0  . ipratropium-albuterol (DUONEB) 0.5-2.5 (3) MG/3ML SOLN TAKE 3 MLS BY NEBULIZATION EVERY 4 (FOUR) HOURS AS NEEDED. MAX:6 DOSES PER DAY 540 mL 0  . metolazone (ZAROXOLYN) 2.5 MG tablet As needed for increase in swelling (Patient taking differently: Take 2.5 mg by mouth once a week. As needed for increase in swelling) 90 tablet 3  . naproxen sodium (ANAPROX) 220 MG tablet Take 220 mg by mouth 2 (two) times daily as needed (back pain).    . nystatin cream (MYCOSTATIN) Apply 1 application topically 2 (two) times daily. (Patient taking differently: Apply 1 application topically 2 (two) times daily as needed for dry skin (ON FEET). ) 30 g 1  . polyethylene glycol  (MIRALAX / GLYCOLAX) packet Take 17 g by mouth daily as needed for moderate constipation.     . potassium chloride (K-DUR) 10 MEQ tablet Take 2 tablets 2 x /day for Potassium & take additional 2 tablets with additional dose of Lasix 360 tablet 3  . rivaroxaban (XARELTO) 20 MG TABS tablet Take 1 tablet Daily to Prevent Blood Clots 90 tablet 1  . rosuvastatin (CRESTOR) 10 MG tablet Take 1 tablet (10 mg total) by mouth at bedtime. 90 tablet 1  . spironolactone (ALDACTONE) 25 MG tablet TAKE 0.5 TABLETS (12.5 MG TOTAL) BY MOUTH DAILY. 45 tablet 1  . terbinafine (LAMISIL) 250 MG tablet TAKE 1 TABLET BY MOUTH EVERY DAY 90 tablet 0  . traZODone (DESYREL) 100 MG tablet TAKE 1 TABLET AT HOUR OF SLEEP 90 tablet 1  . TRELEGY ELLIPTA 100-62.5-25 MCG/INH AEPB INHALE 1 PUFF INTO THE LUNGS ONCE A DAY 180 each 1  . triamcinolone cream (KENALOG) 0.5 % Apply 1 application topically 2 (two) times daily. (Patient taking differently: Apply 1 application topically 2 (two) times daily as needed (IRRITATION). ) 80 g 2   Facility-Administered Medications Prior to Visit  Medication Dose Route Frequency Provider Last Rate Last Dose  . dexamethasone (DECADRON) injection 10 mg  10 mg Intramuscular Once McClanahan, Kyra, NP      . ipratropium-albuterol (DUONEB) 0.5-2.5 (3) MG/3ML nebulizer solution 3 mL  3 mL Nebulization Once Vicie Mutters, PA-C      . ipratropium-albuterol (DUONEB) 0.5-2.5 (3) MG/3ML nebulizer solution 3 mL  3 mL Nebulization Once Garnet Sierras, NP        PAST MEDICAL HISTORY: Past Medical History:  Diagnosis Date  . Anemia   . Anxiety   . Atrial flutter ([place])   . Borderline diabetes   . Chronic diastolic heart failure ([place])   . COPD (chronic obstructive pulmonary disease) ([place])   . Depression   . GERD (gastroesophageal reflux disease)   . HTN (hypertension)   . Hyperlipidemia   . Obstructive sleep apnea   . Persistent atrial fibrillation ([place])    Myoview 4/09: No ischemia;  echo 4/09: EF  60-65%;   Flecainide, beta blocker, Coumadin;    Unable to take Tikosyn 2/2 prolonged QT  . Pulmonary embolism ([facility])    Right lower lobe diagnosed by CT 6/12  . Type II or unspecified type diabetes mellitus without mention of complication, not stated as uncontrolled     PAST SURGICAL  HISTORY: Past Surgical History:  Procedure Laterality Date  . basal skin can    . CARDIOVERSION  01/19/2012   Procedure: CARDIOVERSION;  Surgeon: Jolaine Artist, MD;  [facility] ENDOSCOPY;  Service: Cardiovascular;  Laterality: N/A;  . CARDIOVERSION  01/21/2012   Procedure: CARDIOVERSION;  Surgeon: Carlena Bjornstad, MD;  Location: [place];  Service: Cardiovascular;  Laterality: N/A;  Rm 4733  . TEE WITHOUT CARDIOVERSION  01/19/2012   Procedure: TRANSESOPHAGEAL ECHOCARDIOGRAM (TEE);  Surgeon: Jolaine Artist, MD;  Location: [facility] ENDOSCOPY;  Service: Cardiovascular;  Laterality: N/A;  . TUBAL LIGATION  1996    FAMILY HISTORY: Family History  Problem Relation Age of Onset  . Colon cancer Mother   . Coronary artery disease Mother   . Coronary artery disease Father   . Asthma Father   . Emphysema Father   . Allergies Father   . Heart attack Maternal Grandfather   . Heart disease Maternal Grandmother   . Depression Daughter     SOCIAL HISTORY: Social History   Socioeconomic History  . Marital status: Legally Separated    Spouse name: n/a  . Number of children: 4  . Years of education: 47  . Highest education level: Not on file  Occupational History  . Occupation: clerical    Comment: Manufacturing engineer  Social Needs  . Financial resource strain: Not on file  . Food insecurity    Worry: Not on file    Inability: Not on file  . Transportation needs    Medical: Not on file    Non-medical: Not on file  Tobacco Use  . Smoking status: Former Smoker    Packs/day: 2.00    Years: 35.00    Pack years: 70.00    Types: Cigarettes    Quit date: 09/08/2009    Years since quitting: 9.2  .  Smokeless tobacco: Never Used  Substance and Sexual Activity  . Alcohol use: No  . Drug use: No  . Sexual activity: Never    Birth control/protection: None  Lifestyle  . Physical activity    Days per week: Not on file    Minutes per session: Not on file  . Stress: Not on file  Relationships  . Social Herbalist on phone: Not on file    Gets together: Not on file    Attends religious service: Not on file    Active member of club or organization: Not on file    Attends meetings of clubs or organizations: Not on file    Relationship status: Not on file  . Intimate partner violence    Fear of current or ex partner: Not on file    Emotionally abused: Not on file    Physically abused: Not on file    Forced sexual activity: Not on file  Other Topics Concern  . Not on file  Social History Narrative   Married but currently separated with 4 children, 2 of whom still live with her.  Youngest son is a Equities trader in [employer], to graduate in 07/2012.     PHYSICAL EXAM  Vitals:   11/27/18 1533  BP: 114/72  Pulse: 75  Temp: 97.7 F (36.5 C)  Weight: 244 lb (110.7 kg)  Height: _0  (1.499 m)   Body mass index is 49.28 kg/m.  Generalized: Well developed,morbidly obese female in no acute distress  Head: normocephalic and atraumatic,. Oropharynx benign [place] 5 Neck: Supple, circumference 16.5 Lungs clear Musculoskeletal: No deformity  Skin  no rash or edema Neurological examination   Mentation: Alert oriented to time, place, history taking. Attention span and concentration appropriate. Recent and remote memory intact.  Follows all commands speech and language fluent.   Cranial nerve II-XII: Pupils were equal round reactive to light extraocular movements were full, visual field were full on confrontational test. Facial sensation and strength were normal. hearing was intact to finger rubbing bilaterally. Uvula tongue midline. head turning and shoulder shrug were normal and  symmetric.Tongue protrusion into cheek strength was normal. Motor: normal bulk and tone, full strength in the BUE, BLE, fine finger movements normal, no pronator drift. No focal weakness Sensory: normal and symmetric to light touch, in the upper and lower extremities  Coordination: finger-nose-finger, heel-to-shin bilaterally, no dysmetria Gait and Station: Rising up from seated position without assistance, normal stance,  moderate stride, good arm swing, smooth turning, able to perform tiptoe, and heel walking without difficulty. Tandem gait is unsteady. No assistive device  DIAGNOSTIC DATA (LABS, IMAGING, TESTING) - I reviewed patient records, labs, notes, testing and imaging myself where available.  Lab Results  Component Value Date   WBC 6.8 10/09/2018   HGB 12.8 10/09/2018   HCT 38.7 10/09/2018   MCV 86.4 10/09/2018   PLT 231 10/09/2018      Component Value Date/Time   NA 145 10/09/2018 0000   K 4.0 10/09/2018 0000   CL 109 10/09/2018 0000   CO2 28 10/09/2018 0000   GLUCOSE 86 10/09/2018 0000   BUN 23 10/09/2018 0000   CREATININE 1.76 (H) 10/09/2018 0000   CALCIUM 9.1 10/09/2018 0000   PROT 6.4 10/09/2018 0000   ALBUMIN 4.1 09/21/2016 1728   AST 13 10/09/2018 0000   ALT 22 10/09/2018 0000   ALKPHOS 85 09/21/2016 1728   BILITOT 0.3 10/09/2018 0000   GFRNONAA 31 (L) 10/09/2018 0000   GFRAA 36 (L) 10/09/2018 0000   Lab Results  Component Value Date   CHOL 137 08/23/2018   HDL 43 (L) 08/23/2018   LDLCALC 70 08/23/2018   TRIG 159 (H) 08/23/2018   CHOLHDL 3.2 08/23/2018   Lab Results  Component Value Date   HGBA1C 6.6 (H) 08/23/2018   Lab Results  Component Value Date   VITAMINB12 241 05/24/2018   Lab Results  Component Value Date   TSH 2.87 08/23/2018      ASSESSMENT AND PLAN  58 y.o. year old female  has a past medical history of Anemia; Anxiety; Atrial flutter ([place]); Borderline diabetes; Chronic diastolic heart failure (HCC); COPD (chronic obstructive  pulmonary disease) ([place]); Depression; GERD (gastroesophageal reflux disease); HTN (hypertension); Hyperlipidemia; and severe Obstructive sleep apnea; Persistent atrial fibrillation ([place]);  here to follow-up CPAP compliance .  CPAP compliance is 100%  Continue same settings Follow-up Yearly We will order supplies.  patient needs to loose weight and is unsure about healthy food options.  I like for her to see dr Redgie Grayer in the obesity clinic.  I spent 20 minutes  in total face to face time with the patient more than 50% of which was spent counseling and coordination of care, reviewing test results reviewing medications and discussing and reviewing the diagnosis of obstructive sleep apnea reviewing the compliance report   Larey Seat, MD   [facility] [address], [place], [place] [address] [phone]

## 2018-11-29 ENCOUNTER — Other Ambulatory Visit: Payer: Self-pay | Admitting: Physician Assistant

## 2018-11-29 DIAGNOSIS — G8929 Other chronic pain: Secondary | ICD-10-CM

## 2018-11-29 DIAGNOSIS — M545 Low back pain, unspecified: Secondary | ICD-10-CM

## 2018-11-30 ENCOUNTER — Encounter: Payer: Self-pay | Admitting: Physician Assistant

## 2018-11-30 ENCOUNTER — Ambulatory Visit: Payer: BC Managed Care – PPO | Admitting: Physician Assistant

## 2018-11-30 ENCOUNTER — Other Ambulatory Visit: Payer: Self-pay

## 2018-11-30 VITALS — BP 124/86 | HR 68 | Temp 97.7°F | Ht 59.0 in | Wt 245.0 lb

## 2018-11-30 DIAGNOSIS — I4891 Unspecified atrial fibrillation: Secondary | ICD-10-CM

## 2018-11-30 DIAGNOSIS — I2723 Pulmonary hypertension due to lung diseases and hypoxia: Secondary | ICD-10-CM

## 2018-11-30 DIAGNOSIS — Z1389 Encounter for screening for other disorder: Secondary | ICD-10-CM | POA: Diagnosis not present

## 2018-11-30 DIAGNOSIS — Z79899 Other long term (current) drug therapy: Secondary | ICD-10-CM | POA: Diagnosis not present

## 2018-11-30 DIAGNOSIS — Z130 Encounter for screening for diseases of the blood and blood-forming organs and certain disorders involving the immune mechanism: Secondary | ICD-10-CM | POA: Diagnosis not present

## 2018-11-30 DIAGNOSIS — E559 Vitamin D deficiency, unspecified: Secondary | ICD-10-CM

## 2018-11-30 DIAGNOSIS — Z91199 Patient's noncompliance with other medical treatment and regimen due to unspecified reason: Secondary | ICD-10-CM

## 2018-11-30 DIAGNOSIS — I159 Secondary hypertension, unspecified: Secondary | ICD-10-CM

## 2018-11-30 DIAGNOSIS — Z13220 Encounter for screening for lipoid disorders: Secondary | ICD-10-CM | POA: Diagnosis not present

## 2018-11-30 DIAGNOSIS — Z0000 Encounter for general adult medical examination without abnormal findings: Secondary | ICD-10-CM

## 2018-11-30 DIAGNOSIS — D649 Anemia, unspecified: Secondary | ICD-10-CM

## 2018-11-30 DIAGNOSIS — F331 Major depressive disorder, recurrent, moderate: Secondary | ICD-10-CM

## 2018-11-30 DIAGNOSIS — Z86711 Personal history of pulmonary embolism: Secondary | ICD-10-CM

## 2018-11-30 DIAGNOSIS — F419 Anxiety disorder, unspecified: Secondary | ICD-10-CM

## 2018-11-30 DIAGNOSIS — Z23 Encounter for immunization: Secondary | ICD-10-CM

## 2018-11-30 DIAGNOSIS — J449 Chronic obstructive pulmonary disease, unspecified: Secondary | ICD-10-CM

## 2018-11-30 DIAGNOSIS — G4733 Obstructive sleep apnea (adult) (pediatric): Secondary | ICD-10-CM

## 2018-11-30 DIAGNOSIS — E038 Other specified hypothyroidism: Secondary | ICD-10-CM

## 2018-11-30 DIAGNOSIS — Z1329 Encounter for screening for other suspected endocrine disorder: Secondary | ICD-10-CM | POA: Diagnosis not present

## 2018-11-30 DIAGNOSIS — Z131 Encounter for screening for diabetes mellitus: Secondary | ICD-10-CM

## 2018-11-30 DIAGNOSIS — E785 Hyperlipidemia, unspecified: Secondary | ICD-10-CM

## 2018-11-30 DIAGNOSIS — E1122 Type 2 diabetes mellitus with diabetic chronic kidney disease: Secondary | ICD-10-CM

## 2018-11-30 DIAGNOSIS — I4892 Unspecified atrial flutter: Secondary | ICD-10-CM

## 2018-11-30 DIAGNOSIS — E1165 Type 2 diabetes mellitus with hyperglycemia: Secondary | ICD-10-CM

## 2018-11-30 DIAGNOSIS — Z0001 Encounter for general adult medical examination with abnormal findings: Secondary | ICD-10-CM

## 2018-11-30 DIAGNOSIS — I5033 Acute on chronic diastolic (congestive) heart failure: Secondary | ICD-10-CM

## 2018-11-30 DIAGNOSIS — N184 Chronic kidney disease, stage 4 (severe): Secondary | ICD-10-CM

## 2018-11-30 DIAGNOSIS — K219 Gastro-esophageal reflux disease without esophagitis: Secondary | ICD-10-CM

## 2018-11-30 DIAGNOSIS — Z9119 Patient's noncompliance with other medical treatment and regimen: Secondary | ICD-10-CM

## 2018-11-30 NOTE — Patient Instructions (Addendum)
Your ears and sinuses are connected by the eustachian tube. When your sinuses are inflamed, this can close off the tube and cause fluid to collect in your middle ear. This can then cause dizziness, popping, clicking, ringing, and echoing in your ears. This is often NOT an infection and does NOT require antibiotics, it is caused by inflammation so the treatments help the inflammation. This can take a long time to get better so please be patient.  Here are things you can do to help with this: - Try the Flonase or Nasonex. Remember to spray each nostril twice towards the outer part of your eye.  Do not sniff but instead pinch your nose and tilt your head back to help the medicine get into your sinuses.  The best time to do this is at bedtime.Stop if you get blurred vision or nose bleeds.  -While drinking fluids, pinch and hold nose close and swallow, to help open eustachian tubes to drain fluid behind ear drums. -Please pick one of the over the counter allergy medications below and take it once daily for allergies.  It will also help with fluid behind ear drums. Claritin or loratadine cheapest but likely the weakest  Zyrtec or certizine at night because it can make you sleepy The strongest is allegra or fexafinadine  Cheapest at [facility], [employer], [employer] -can use decongestant over the counter, please do not use if you have high blood pressure or certain heart conditions.   if worsening HA, changes vision/speech, imbalance, weakness go to the ER   HOW TO SCHEDULE A MAMMOGRAM  [facility]  7 a.m.-6:30 p.m., Monday 7 a.m.-5 p.m., Tuesday-Friday Schedule an appointment by calling [phone].  COLOGUARD INFORMATION   Cologuard is an easy to use noninvasive colon cancer screening test based on the latest advances in stool DNA science.   Colon cancer is 3rd most diagnosed cancer and 2nd leading cause of death in both men and women 60 years of age and older despite being  one of the most preventable and treatable cancers if found early.  4 of out 5 people diagnosed with colon cancer have NO prior family history.  When caught EARLY 60% of colon cancer is curable.   More than 92% of cologuard patients have NO out of pocket cost for screening however only your insurer can confirm how Cologuard would be covered for you. Cologuard has a team of specialist that can help you contact your insurer and ask the right questions. Please call [phone] so they can help.   You will receive a short call from [place] support center at [employer], when you receive a call they will say they are from [employer],  to confirm your mailing address and give you more information.  When they calll you, it will appear on the caller ID as "[employer]" or in some cases only this number will appear, [phone].   [employer] will ship your collection kit directly to you. You will collect a single stool sample in the privacy of your own home, no special preparation required. You will return the kit via [place] pre-paid shipping or pick-up, in the same box it arrived in. Then I will contact you to discuss your results after I receive them from the laboratory.   If you have any questions or concerns, Colog[facility] Customer Support Specialist are available 24 hours a day, 7 days a week at [phone] or go to [email or web address].  General eating tips  What to Avoid . Avoid added sugars o Often added sugar can be found in processed foods such as many condiments, dry cereals, cakes, cookies, chips, crisps, crackers, candies, sweetened drinks, etc.  o Read labels and AVOID/DECREASE use of foods with the following in their ingredient list: Sugar, fructose, high fructose corn syrup, sucrose, glucose, maltose, dextrose, molasses, cane sugar, brown sugar, any type of syrup, agave nectar, etc.   . Avoid snacking in between meals- drink water or if  you feel you need a snack, pick a high water content snack such as cucumbers, watermelon, or any veggie.  Marland Kitchen Avoid foods made with flour o If you are going to eat food made with flour, choose those made with whole-grains; and, minimize your consumption as much as is tolerable . Avoid processed foods o These foods are generally stocked in the middle of the grocery store.  o Focus on shopping on the perimeter of the grocery.  What to Include . Vegetables o GREEN LEAFY VEGETABLES: Kale, spinach, mustard greens, collard greens, cabbage, broccoli, etc. o OTHER: Asparagus, cauliflower, eggplant, carrots, peas, Brussel sprouts, tomatoes, bell peppers, zucchini, beets, cucumbers, etc. . Grains, seeds, and legumes o Beans: kidney beans, black eyed peas, garbanzo beans, black beans, pinto beans, etc. o Whole, unrefined grains: brown rice, barley, bulgur, oatmeal, etc. . Healthy fats  o Avoid highly processed fats such as vegetable oil o Examples of healthy fats: avocado, olives, virgin olive oil, dark chocolate (?72% Cocoa), nuts (peanuts, almonds, walnuts, cashews, pecans, etc.) o Please still do small amount of these healthy fats, they are dense in calories.  . Low - Moderate Intake of Animal Sources of Protein o Meat sources: chicken, [place], salmon, tuna. Limit to 4 ounces of meat at one time or the size of your palm. o Consider limiting dairy sources, but when choosing dairy focus on: PLAIN [place] yogurt, cottage cheese, high-protein milk . Fruit o Choose berries

## 2018-12-01 LAB — COMPLETE METABOLIC PANEL WITH GFR
AG Ratio: 1.6 (calc) (ref 1.0–2.5)
ALT: 22 U/L (ref 6–29)
AST: 15 U/L (ref 10–35)
Albumin: 4.4 g/dL (ref 3.6–5.1)
Alkaline phosphatase (APISO): 100 U/L (ref 37–153)
BUN/Creatinine Ratio: 16 (calc) (ref 6–22)
BUN: 24 mg/dL (ref 7–25)
CO2: 29 mmol/L (ref 20–32)
Calcium: 9.7 mg/dL (ref 8.6–10.4)
Chloride: 101 mmol/L (ref 98–110)
Creat: 1.49 mg/dL — ABNORMAL HIGH (ref 0.50–1.05)
GFR, Est African American: 44 mL/min/{1.73_m2} — ABNORMAL LOW (ref >=60)
GFR, Est Non African American: 38 mL/min/{1.73_m2} — ABNORMAL LOW (ref >=60)
Globulin: 2.8 g/dL (calc) (ref 1.9–3.7)
Glucose, Bld: 113 mg/dL — ABNORMAL HIGH (ref 65–99)
Potassium: 3.8 mmol/L (ref 3.5–5.3)
Sodium: 141 mmol/L (ref 135–146)
Total Bilirubin: 0.6 mg/dL (ref 0.2–1.2)
Total Protein: 7.2 g/dL (ref 6.1–8.1)

## 2018-12-01 LAB — CBC WITH DIFFERENTIAL/PLATELET
Absolute Monocytes: 382 cells/uL (ref 200–950)
Basophils Absolute: 27 cells/uL (ref 0–200)
Basophils Relative: 0.4 %
Eosinophils Absolute: 80 cells/uL (ref 15–500)
Eosinophils Relative: 1.2 %
HCT: 42.4 % (ref 35.0–45.0)
Hemoglobin: 14.1 g/dL (ref 11.7–15.5)
Lymphs Abs: 1447 cells/uL (ref 850–3900)
MCH: 28.3 pg (ref 27.0–33.0)
MCHC: 33.3 g/dL (ref 32.0–36.0)
MCV: 85 fL (ref 80.0–100.0)
MPV: 11.3 fL (ref 7.5–12.5)
Monocytes Relative: 5.7 %
Neutro Abs: 4764 cells/uL (ref 1500–7800)
Neutrophils Relative %: 71.1 %
Platelets: 231 10*3/uL (ref 140–400)
RBC: 4.99 10*6/uL (ref 3.80–5.10)
RDW: 13.3 % (ref 11.0–15.0)
Total Lymphocyte: 21.6 %
WBC: 6.7 10*3/uL (ref 3.8–10.8)

## 2018-12-01 LAB — LIPID PANEL
Cholesterol: 140 mg/dL (ref <200)
HDL: 46 mg/dL — ABNORMAL LOW (ref >=50)
LDL Cholesterol (Calc): 73 mg/dL (calc)
Non-HDL Cholesterol (Calc): 94 mg/dL (calc) (ref <130)
Total CHOL/HDL Ratio: 3 (calc) (ref <5.0)
Triglycerides: 124 mg/dL (ref <150)

## 2018-12-01 LAB — IRON, TOTAL/TOTAL IRON BINDING CAP
%SAT: 43 % (calc) (ref 16–45)
Iron: 154 ug/dL (ref 45–160)
TIBC: 362 mcg/dL (calc) (ref 250–450)

## 2018-12-01 LAB — MICROALBUMIN / CREATININE URINE RATIO
Creatinine, Urine: 178 mg/dL (ref 20–275)
Microalb Creat Ratio: 5 mcg/mg creat (ref <30)
Microalb, Ur: 0.9 mg/dL

## 2018-12-01 LAB — HEMOGLOBIN A1C
Hgb A1c MFr Bld: 6.3 % of total Hgb — ABNORMAL HIGH (ref <5.7)
Mean Plasma Glucose: 134 (calc)
eAG (mmol/L): 7.4 (calc)

## 2018-12-01 LAB — VITAMIN D 25 HYDROXY (VIT D DEFICIENCY, FRACTURES): Vit D, 25-Hydroxy: 19 ng/mL — ABNORMAL LOW (ref 30–100)

## 2018-12-01 LAB — URINALYSIS, ROUTINE W REFLEX MICROSCOPIC
Bilirubin Urine: NEGATIVE
Glucose, UA: NEGATIVE
Hgb urine dipstick: NEGATIVE
Ketones, ur: NEGATIVE
Nitrite: NEGATIVE
Protein, ur: NEGATIVE
RBC / HPF: NONE SEEN /HPF (ref 0–2)
Specific Gravity, Urine: 1.022 (ref 1.001–1.03)
pH: 5.5 (ref 5.0–8.0)

## 2018-12-01 LAB — VITAMIN B12: Vitamin B-12: 594 pg/mL (ref 200–1100)

## 2018-12-01 LAB — MAGNESIUM: Magnesium: 2 mg/dL (ref 1.5–2.5)

## 2018-12-01 LAB — TSH: TSH: 1.32 mIU/L (ref 0.40–4.50)

## 2018-12-15 DIAGNOSIS — G4733 Obstructive sleep apnea (adult) (pediatric): Secondary | ICD-10-CM | POA: Diagnosis not present

## 2018-12-19 ENCOUNTER — Other Ambulatory Visit (HOSPITAL_COMMUNITY): Payer: Self-pay | Admitting: Internal Medicine

## 2018-12-23 ENCOUNTER — Other Ambulatory Visit: Payer: Self-pay | Admitting: Physician Assistant

## 2019-01-03 ENCOUNTER — Other Ambulatory Visit: Payer: Self-pay | Admitting: Internal Medicine

## 2019-01-05 DIAGNOSIS — G4733 Obstructive sleep apnea (adult) (pediatric): Secondary | ICD-10-CM | POA: Diagnosis not present

## 2019-01-11 ENCOUNTER — Other Ambulatory Visit (HOSPITAL_COMMUNITY): Payer: Self-pay | Admitting: Internal Medicine

## 2019-01-11 ENCOUNTER — Other Ambulatory Visit: Payer: Self-pay | Admitting: Physician Assistant

## 2019-01-11 ENCOUNTER — Other Ambulatory Visit: Payer: Self-pay | Admitting: Internal Medicine

## 2019-01-11 DIAGNOSIS — I5032 Chronic diastolic (congestive) heart failure: Secondary | ICD-10-CM

## 2019-01-14 ENCOUNTER — Other Ambulatory Visit: Payer: Self-pay | Admitting: Physician Assistant

## 2019-01-22 ENCOUNTER — Ambulatory Visit (INDEPENDENT_AMBULATORY_CARE_PROVIDER_SITE_OTHER): Payer: BC Managed Care – PPO | Admitting: Physician Assistant

## 2019-01-22 ENCOUNTER — Other Ambulatory Visit: Payer: Self-pay

## 2019-01-22 ENCOUNTER — Encounter: Payer: Self-pay | Admitting: Physician Assistant

## 2019-01-22 VITALS — BP 118/76 | Temp 97.3°F | Ht 59.0 in | Wt 248.6 lb

## 2019-01-22 DIAGNOSIS — R591 Generalized enlarged lymph nodes: Secondary | ICD-10-CM | POA: Diagnosis not present

## 2019-01-22 DIAGNOSIS — K047 Periapical abscess without sinus: Secondary | ICD-10-CM

## 2019-01-22 MED ORDER — AMOXICILLIN-POT CLAVULANATE 875-125 MG PO TABS: 1.0000 | ORAL_TABLET | Freq: Two times a day (BID) | ORAL | 0 refills | Status: AC

## 2019-01-22 NOTE — Patient Instructions (Signed)
MAXIMUM AMOUNT OF TYLENOL IN A DAY  Aleve, ibuprofen is an antiinflammatory You can take tylenol (590m) or tylenol arthritis (6571m with the meloxicam/antiinflammatories. The max you can take of tylenol a day is 300073maily, this is a max of 6 pills a day of the regular tyelnol (500m10mr a max of 4 a day of the tylenol arthritis (650mg40m long as no other medications you are taking contain tylenol.   this can cause inflammation in your stomach and can cause ulcers or bleeding, this will look like black tarry stools Make sure you taking it with food Try not to take it daily, take AS needed Can take with zantac  I discussed strict and specific return precautions related to this dental abscess. Including but not limited to trouble swallowing, trouble breathing, difficulty opening mouth or high fevers would be symptoms needed to return immediately for reevaluation. Patient verbalizes understanding and agreement.  GO SEE A DENTIST, I CAN GIVE YOU ANXIETY MEDICATION IF YOU NEED TO GET ON ANTIBIOTIC FOR 10 DAYS USE HEATING PAD ON YOUR FACE  Dental Abscess  A dental abscess is a collection of pus in or around a tooth that results from an infection. An abscess can cause pain in the affected area as well as other symptoms. Treatment is important to help with symptoms and to prevent the infection from spreading. What are the causes? This condition is caused by a bacterial infection around the root of the tooth that involves the inner part of the tooth (pulp). It may result from:  Severe tooth decay.  Trauma to the tooth, such as a broken or chipped tooth, that allows bacteria to enter into the pulp.  Severe gum disease around a tooth. What increases the risk? This condition is more likely to develop in males. It is also more likely to develop in people who:  Have dental decay (cavities).  Eat sugary snacks between meals.  Use tobacco products.  Have diabetes.  Have a weakened  disease-fighting system (immune system).  Do not brush and care for their teeth regularly. What are the signs or symptoms? Symptoms of this condition include:  Severe pain in and around the infected tooth.  Swelling and redness around the infected tooth, in the mouth, or in the face.  Tenderness.  Pus drainage.  Bad breath.  Bitter taste in the mouth.  Difficulty swallowing.  Difficulty opening the mouth.  Nausea.  Vomiting.  Chills.  Swollen neck glands.  Fever. How is this diagnosed? This condition is diagnosed based on:  Your symptoms and your medical and dental history.  An examination of the infected tooth. During the exam, your dentist may tap on the infected tooth. You may also have X-rays of the affected area. How is this treated? This condition is treated by getting rid of the infection. This may be done with:  Incision and drainage. This procedure is done by making an incision in the abscess to drain out the pus. Removing pus is the first priority in treating an abscess.  Antibiotic medicines. These may be used in certain situations.  Antibacterial mouth rinse.  A root canal. This may be performed to save the tooth. Your dentist accesses the visible part of your tooth (crown) with a drill and removes any damaged pulp. Then the space is filled and sealed off.  Tooth extraction. The tooth is pulled out if it cannot be saved by other treatment. You may also receive treatment for pain, such as:  Acetaminophen  or NSAIDs.  Gels that contain a numbing medicine.  An injection to block the pain near your nerve. Follow these instructions at home: Medicines  Take over-the-counter and prescription medicines only as told by your dentist.  If you were prescribed an antibiotic, take it as told by your dentist. Do not stop taking the antibiotic even if you start to feel better.  If you were prescribed a gel that contains a numbing medicine, use it exactly as  told in the directions. Do not use these gels for children who are younger than 79 years of age.  Do not drive or use heavy machinery while taking prescription pain medicine. General instructions  Rinse out your mouth often with salt water to relieve pain or swelling. To make a salt-water mixture, completely dissolve -1 tsp of salt in 1 cup of warm water.  Eat a soft diet while your abscess is healing.  Drink enough fluid to keep your urine pale yellow.  Do not apply heat to the outside of your mouth.  Do not use any products that contain nicotine or tobacco, such as cigarettes and e-cigarettes. If you need help quitting, ask your health care provider.  Keep all follow-up visits as told by your dentist. This is important. How is this prevented?  Brush your teeth every morning and night with fluoride toothpaste. Floss one time each day.  Get regularly scheduled dental cleanings.  Consider having a dental sealant applied on teeth that have deep holes (caries).  Drink fluoridated water regularly. This includes most tap water. Check the label on bottled water to see if it contains fluoride.  Drink water instead of sugary drinks.  Eat healthy meals and snacks.  Wear a mouth guard or face shield to protect your teeth while playing sports. Contact a health care provider if:  Your pain is worse and is not helped by medicine. Get help right away if:  You have a fever or chills.  Your symptoms suddenly get worse.  You have a very bad headache.  You have problems breathing or swallowing.  You have trouble opening your mouth.  You have swelling in your neck or around your eye. Summary  A dental abscess is a collection of pus in or around a tooth that results from an infection.  A dental abscess may result from severe tooth decay, trauma to the tooth, or severe gum disease around a tooth.  Symptoms include severe pain, swelling, redness, and drainage of pus in and around the  infected tooth.  The first priority in treating a dental abscess is to drain out the pus. Treatment may also involve removing damage inside the tooth (root canal) or pulling out (extracting) the tooth. This information is not intended to replace advice given to you by your health care provider. Make sure you discuss any questions you have with your health care provider. Document Released: 01/25/2005 Document Revised: 01/07/2017 Document Reviewed: 09/27/2016 Elsevier Patient Education  2020 [place].   Parotitis  Parotitis is inflammation of one or both of your parotid glands. These glands produce saliva. They are found on each side of your face, below and in front of your earlobes. The saliva that they produce comes out of tiny openings (ducts) inside your cheeks. Parotitis may cause sudden swelling and pain (acute parotitis). It can also cause repeated episodes of swelling and pain or continued swelling that may or may not be painful (chronic parotitis). What are the causes? This condition may be caused by:  Infections from bacteria.  Infections from viruses, such as mumps or HIV.  Blockage (obstruction) of saliva flow through the parotid glands. This can be from a stone, scar tissue, or a tumor.  Diseases that cause your body's defense system (immune system) to attack healthy cells in your salivary glands. These are called autoimmune diseases. What increases the risk? You are more likely to develop this condition if:  You are 34 years old or older.  You do not drink enough fluids (are dehydrated).  You drink too much alcohol.  You have: ? A dry mouth. ? Poor dental hygiene. ? Diabetes. ? Gout. ? A long-term illness.  You have had radiation treatments to the head and neck.  You take certain medicines. What are the signs or symptoms? Symptoms of this condition depend on the cause. Symptoms may include:  Swelling under and in front of the ear. This may get worse after  eating.  Redness of the skin over the parotid gland.  Pain and tenderness over the parotid gland. This may get worse after eating.  Fever or chills.  Pus coming from the ducts inside the mouth.  Dry mouth.  A bad taste in the mouth. How is this diagnosed? This condition may be diagnosed based on:  Your medical history.  A physical exam.  Tests to find the cause of the parotitis. These may include: ? Doing blood tests to check for an autoimmune disease or infections from a virus. ? Taking a fluid sample from the parotid gland and testing it for infection. ? Injecting the ducts of the parotid gland with a dye and then taking X-rays (sialogram). ? Having other imaging tests of the gland, such as X-rays, ultrasound, MRI, or CT scan. ? Checking the opening of the gland for a stone or obstruction. ? Placing a needle into the gland to remove tissue for a biopsy (fine needle aspiration). How is this treated? Treatment for this condition depends on the cause. Treatment may include:  Antibiotic medicine for a bacterial infection.  Drinking more fluids.  Removing a stone or obstruction.  Treating an underlying disease that is causing parotitis.  Surgery to drain an infection, remove a growth, or remove the whole gland (parotidectomy). Treatment may not be needed if parotid swelling goes away with home care. Follow these instructions at home: Medicines   Take over-the-counter and prescription medicines only as told by your health care provider.  If you were prescribed an antibiotic medicine, take it as told by your health care provider. Do not stop taking the antibiotic even if you start to feel better. Managing pain and swelling  If directed, apply heat to the affected area as often as told by your health care provider. Use the heat source that your health care provider recommends, such as a moist heat pack or a heating pad. To apply the heat: ? Place a towel between your skin  and the heat source. ? Leave the heat on for 20-30 minutes. ? Remove the heat if your skin turns bright red. This is especially important if you are unable to feel pain, heat, or cold. You may have a greater risk of getting burned.  Gargle with a salt-water mixture 3-4 times a day or as needed. To make a salt-water mixture, completely dissolve -1 tsp (3-6 g) of salt in 1 cup (237 mL) of warm water.  Gently massage the parotid glands as told by your health care provider. General instructions   Drink enough fluid to  keep your urine pale yellow.  Keep your mouth clean and moist.  Try sucking on sour candy. This may help to make your mouth less dry by stimulating the flow of saliva.  Maintain good oral health. ? Brush your teeth at least two times a day. ? Floss your teeth every day. ? See your dentist regularly.  Do not use any products that contain nicotine or tobacco, such as cigarettes, e-cigarettes, and chewing tobacco. If you need help quitting, ask your health care provider.  Do not drink alcohol.  Keep all follow-up visits as told by your health care provider. This is important. Contact a health care provider if:  You have a fever or chills.  You have new symptoms.  Your symptoms get worse.  Your symptoms do not improve with treatment. Get help right away if:  You have difficulty breathing or swallowing because of the swollen gland. Summary  Parotitis is inflammation of one or both of your parotid glands.  Symptoms include pain and swelling under and in front of the ear. They may also include a fever and a bad taste in your mouth.  This condition may be treated with antibiotics, increasing fluids, or surgery.  In some cases, parotitis may go away on its own without treatment.  You should drink plenty of fluids, maintain good oral hygiene, and avoid tobacco products. This information is not intended to replace advice given to you by your health care provider. Make  sure you discuss any questions you have with your health care provider. Document Released: 07/17/2001 Document Revised: 08/23/2017 Document Reviewed: 08/23/2017 Elsevier Patient Education  [place].

## 2019-01-22 NOTE — Progress Notes (Signed)
Subjective:    Patient ID: [name], female    DOB: 02/12/1960, 58 y.o.   MRN: [id number]  HPI 58 y.o. obese WF with history of depression, OSA/COPD, CHF class 3, atrial flutter, history of PE, anemia, anxiety, DM2 with CKD, and pulmonary hypertension presents with ear pain.  She has right ear pain, right teeth pain and has some swelling at her jaw. She states her ear and teeth is throbbing, she can not wear her CPAP due to this pain. Painful to chew, hurts to touch, interrupting her sleep. She does not follow with a dentist, last visit was 1992.  Former smoker, Q2011 has 35 pack year smoking history.  She is not on fosamax.   She states no pain with exertion, no worsening SOB (at her baseline), no CP, dizziness, no swelling, no palpitations, no dizziness, sweating, etc.   BMI is Body mass index is 50.21 kg/m., she is working on diet and exercise. Wt Readings from Last 3 Encounters:  01/22/19 248 lb 9.6 oz (112.8 kg)  11/30/18 245 lb (111.1 kg)  11/27/18 244 lb (110.7 kg)    Temperature (!) 97.3 F (36.3 C), weight 248 lb 9.6 oz (112.8 kg).  Medications   Current Facility-Administered Medications (Endocrine & Metabolic):  .  dexamethasone (DECADRON) injection 10 mg  Current Outpatient Medications (Cardiovascular):  .  bisoprolol (ZEBETA) 5 MG tablet, Take 1 tablet Daily for BP .  diltiazem (CARDIZEM CD) 360 MG 24 hr capsule, TAKE 1 CAPSULE BY MOUTH EVERY DAY .  furosemide (LASIX) 40 MG tablet, Take  2  to 3 tablets  Daily as Directed for BP & Fluid Swelling .  metolazone (ZAROXOLYN) 2.5 MG tablet, As needed for increase in swelling (Patient taking differently: Take 2.5 mg by mouth once a week. As needed for increase in swelling) .  rosuvastatin (CRESTOR) 10 MG tablet, Take 1 tablet Daily for Cholesterol .  spironolactone (ALDACTONE) 25 MG tablet, TAKE 1/2 TABLET BY MOUTH EVERY DAY   Current Outpatient Medications (Respiratory):  .  albuterol (PROAIR HFA) 108 (90 Base)  MCG/ACT inhaler, Inhale 2 puffs into the lungs every 6 (six) hours as needed for wheezing or shortness of breath. Marland Kitchen  albuterol (PROVENTIL) (2.5 MG/3ML) 0.083% nebulizer solution, Take 3 mLs (2.5 mg total) by nebulization every 6 (six) hours as needed for wheezing or shortness of breath. Marland Kitchen  ipratropium-albuterol (DUONEB) 0.5-2.5 (3) MG/3ML SOLN, TAKE 3 MLS BY NEBULIZATION EVERY 4 (FOUR) HOURS AS NEEDED. MAX:6 DOSES PER DAY .  TRELEGY ELLIPTA 100-62.5-25 MCG/INH AEPB, INHALE 1 PUFF INTO THE LUNGS ONCE A DAY  Current Facility-Administered Medications (Respiratory):  .  ipratropium-albuterol (DUONEB) 0.5-2.5 (3) MG/3ML nebulizer solution 3 mL .  ipratropium-albuterol (DUONEB) 0.5-2.5 (3) MG/3ML nebulizer solution 3 mL  Current Outpatient Medications (Analgesics):  .  aspirin-acetaminophen-caffeine (EXCEDRIN MIGRAINE) 250-250-65 MG per tablet, Take 1 tablet by mouth every 6 (six) hours as needed for headache. Marland Kitchen  HYDROcodone-acetaminophen (NORCO/VICODIN) 5-325 MG tablet, Take 1 tablet by mouth every 6 (six) hours as needed for severe pain. .  naproxen sodium (ANAPROX) 220 MG tablet, Take 220 mg by mouth 2 (two) times daily as needed (back pain).   Current Outpatient Medications (Hematological):  Marland Kitchen  XARELTO 20 MG TABS tablet, TAKE 1 TABLET DAILY TO PREVENT BLOOD CLOTS   Current Outpatient Medications (Other):  Marland Kitchen  ALPRAZolam (XANAX) 0.25 MG tablet, 1-2 tablets as needed BID for panic attack- Suggest follow up in next 2 weeks if need more medication. Marland Kitchen  chlorhexidine (PERIDEX) 0.12 % solution, Use as directed 15 mLs in the mouth or throat 2 (two) times daily. .  citalopram (CELEXA) 40 MG tablet, TAKE 1 TABLET BY MOUTH EVERY DAY .  diphenhydrAMINE HCl, Sleep, (SLEEP AID) 50 MG CAPS, Take 1 capsule by mouth daily. Marland Kitchen  gabapentin (NEURONTIN) 300 MG capsule, TAKE 1 CAPSULE BY MOUTH THREE TIMES A DAY .  nystatin cream (MYCOSTATIN), Apply 1 application topically 2 (two) times daily. (Patient taking  differently: Apply 1 application topically 2 (two) times daily as needed for dry skin (ON FEET). ) .  polyethylene glycol (MIRALAX / GLYCOLAX) packet, Take 17 g by mouth daily as needed for moderate constipation.  .  potassium chloride (K-DUR) 10 MEQ tablet, Take 2 tablets 2 x /day for Potassium & take additional 2 tablets with additional dose of Lasix .  terbinafine (LAMISIL) 250 MG tablet, TAKE 1 TABLET BY MOUTH EVERY DAY .  traZODone (DESYREL) 100 MG tablet, TAKE 1 TABLET AT HOUR OF SLEEP .  triamcinolone cream (KENALOG) 0.5 %, Apply 1 application topically 2 (two) times daily. (Patient taking differently: Apply 1 application topically 2 (two) times daily as needed (IRRITATION). )   Problem list She has Depression, major, recurrent, moderate ([place]); OSA and COPD overlap syndrome ([place]); G E R D; CHF NYHA class III (symptoms with mildly strenuous activities), acute on chronic, diastolic ([place]); History of pulmonary embolus (PE); Atrial flutter ([facility]); COPD (chronic obstructive pulmonary disease) ([place]); Atrial fibrillation ([place]); Hypothyroid; Hyperlipidemia; HTN (hypertension); Diabetes type 2, uncontrolled ([place]); Anxiety; Anemia; Vitamin D deficiency; Morbid obesity (BMI 49.6); Noncompliance; CKD stage 4 due to type 2 diabetes mellitus ([place]); Pulmonary hypertension due to COPD [facility]); CKD (chronic kidney disease) stage 4, GFR 15-29 ml/min ([place]); Class 3 severe obesity due to excess calories with serious comorbidity and body mass index (BMI) of 45.0 to 49.9 in adult [facility]); and Obstructive sleep apnea treated with continuous positive airway pressure (CPAP) on their problem list.  Review of Systems  Constitutional: Negative.  Negative for chills, fatigue and fever.  HENT: Positive for dental problem, ear pain and facial swelling (right side). Negative for congestion, drooling, ear discharge, hearing loss, mouth sores, nosebleeds, postnasal drip, rhinorrhea, sinus pressure, sneezing, sore throat, tinnitus,  trouble swallowing and voice change.   Eyes: Negative.  Negative for visual disturbance.  Respiratory: Positive for shortness of breath (at baseline). Negative for apnea, cough, choking, chest tightness, wheezing and stridor.   Cardiovascular: Negative.  Negative for chest pain, palpitations and leg swelling.  Genitourinary: Negative.   Neurological: Negative for dizziness, tremors, seizures, syncope, facial asymmetry, speech difficulty, weakness, light-headedness, numbness and headaches.       Objective:   Physical Exam General Appearance: Well nourished, obese, in no apparent distress. Eyes: PERRLA, EOMs, conjunctiva no swelling or erythema Sinuses: No Frontal/maxillary tenderness ENT/Mouth: Ext aud canals clear, TMs without erythema, bulging. No erythema, swelling, or exudate on post pharynx.Tonsils not swollen or erythematous. Hearing normal. Crowded mouth, poor dentition with clear caries on multiple teeth, + tender right lower gum and tooth, no fluctuance, + tenderness at right parotid, no warmth, discharge appreciated. No tongue or mouth ulcers or masses.  Neck: Supple, thyroid normal.  Respiratory: Decreased breath sounds bilateral, without wheezing, rhonchi.  Cardio: Irreg, irreg,  minimal edema Abdomen: Soft, + BS, obese,  Non tender, no guarding, rebound, hernias, masses. Lymphatics: + right cervical neck lymphadenopathy.  Musculoskeletal: Full ROM, 5/5 strength, antalgic gait Skin: well healing scar on left nostril, bilateral feet with  white fungus, some thickening at toes, no ulcers. Warm, dry without rashes, lesions, ecchymosis.  Neuro: Cranial nerves intact. No cerebellar symptoms.  Psych: Awake and oriented X 3,  Insight and Judgment appropriate.        Assessment & Plan:   Tooth abscess Lymphadenopathy Will treat as an abscess tooth, given augmentin, continue tylenol/ibuprofen Follow up dentist ASAP I discussed strict and specific return precautions related to this  dental abscess. Including but not limited to trouble swallowing, trouble breathing, difficulty opening mouth or high fevers would be symptoms needed to return immediately for reevaluation. Patient verbalizes understanding and agreement. Patient has history of smoking, q 2011, if continues to have swelling after ABX and dentist may get CT scan to rule out cancer Has some possible parotitis, will do heat pad and augmentin -     amoxicillin-clavulanate (AUGMENTIN) 875-125 MG tablet; Take 1 tablet by mouth 2 (two) times daily for 10 days.  Willing to send in anxiety medication for dentist visit, son will drive her.

## 2019-02-12 ENCOUNTER — Other Ambulatory Visit: Payer: Self-pay | Admitting: Internal Medicine

## 2019-03-09 ENCOUNTER — Other Ambulatory Visit: Payer: Self-pay | Admitting: Physician Assistant

## 2019-03-09 DIAGNOSIS — F32A Depression, unspecified: Secondary | ICD-10-CM

## 2019-03-09 DIAGNOSIS — F329 Major depressive disorder, single episode, unspecified: Secondary | ICD-10-CM

## 2019-03-09 NOTE — Progress Notes (Signed)
Assessment and Plan:   Chronic diastolic heart failure ([place]) Continue follow up cardio CLOSE FOLLOW UP 4 WEEKS Weight is up, some mild swelling, she has not take zaroxyln lately- will check BMP but advised to take when she gets home Monitor weight daily- wait is up but patient admits to eat large amount of walnuts and fast food Need to do better diet but limited medical literacy and financial inablity, reviewed reading food labels, get frozen veggies over canned, avoid boxed/canned foods if possible.   Chronic atrial fibrillation (HCC) Rate controlled, continue medications, xarelto  follow up with cardiology   Secondary hypertension - continue medications, DASH diet, exercise and monitor at home. Call if greater than 130/80.  -     CBC with Differential/Platelet -     BASIC METABOLIC PANEL WITH GFR -     Hepatic function panel -     TSH  Chronic obstructive pulmonary disease, unspecified COPD type ([place]) TRELOGY, do nebulizer AS NEEDED, continue CPAP  Hyperlipidemia, unspecified hyperlipidemia type -check lipids, decrease fatty foods, increase activity.  -     Lipid panel  Morbid obesity (BMI 49.6) - long discussion about weight loss, diet, and exercise STOP SODA, STOP WALNUTS, DISCUSSED EATING OUT  OSA (obstructive sleep apnea) with COPD overlap syndrome On CPAP and doing better  Depression, unspecified depression type - continue medications, stress management techniques discussed, increase water, good sleep hygiene discussed, increase exercise, and increase veggies.   Medication management -     Magnesium   The patient was advised to call immediately if she has any concerning symptoms in the interval. The patient voices understanding of current treatment options and is in agreement with the current care plan.The patient knows to call the clinic with any problems, questions or concerns or go to the ER if any further progression of symptoms.   Patient walked out without  getting labs, will contact and bring back for lab only.  Continue diet and meds as discussed. Discussed med's effects and SE's.  Future Appointments  Date Time Provider [place]  04/09/2019  8:45 AM Vicie Mutters, PA-C [facility] None  11/28/2019  2:00 PM Ward Givens, NP [facility] None  12/03/2019 10:00 AM Vicie Mutters, PA-C [facility] None    HPI 59 y.o. female  presents for 3 month follow up with hypertension, hyperlipidemia, diabetes and vitamin D. Her blood pressure has been controlled at home, today their BP is BP: 124/78  She does not workout. She denies chest pain, shortness of breath, dizziness.   Son has mental illness and had self inflicted gun shot wound earlier this year. She has depression was started on celexa last OV that she states is helping.  BMI is Body mass index is 51.42 kg/m., she is working on diet and exercise. Wt Readings from Last 3 Encounters:  03/12/19 254 lb 9.6 oz (115.5 kg)  01/22/19 248 lb 9.6 oz (112.8 kg)  11/30/18 245 lb (111.1 kg)   She has a history of afib and CHF,  following with Dr. Haroldine Laws. She is on bisoprolol 90m, diltiazem 360 mg, spirolactone 12.520m She is on lasix 2 a day, however today she has not had any and zaroxyln once a week BUT SHE HAS NOT TAKEN IT THIS PAST MONTH and 8 potassium a day HOWEVER she sometime can not get that much.   Wt Readings from Last 3 Encounters:  03/12/19 254 lb 9.6 oz (115.5 kg)  01/22/19 248 lb 9.6 oz (112.8 kg)  11/30/18 245 lb (111.1  kg)   She had AKI with CKD but kidney function had improved last OV, still advised to see nephrology.  Lab Results  Component Value Date   GFRNONAA 41 (L) 11/30/2018   She also has PHTN due to COPD and OSA, she is on CPAP.  She is on cholesterol medication, crestor 10 mg and denies myalgias. Her cholesterol is not at goal. The cholesterol was: Lab Results  Component Value Date   CHOL 140 11/30/2018   HDL 46 (L) 11/30/2018   LDLCALC 73 11/30/2018   TRIG  124 11/30/2018   CHOLHDL 3.0 11/30/2018     She has been working on diet and exercise for Diabetes, and denies polydipsia, polyuria and visual disturbances. Last A1C  was:  Lab Results  Component Value Date   HGBA1C 6.3 (H) 11/30/2018   Patient is on Vitamin D supplement. She is on thyroid medication. Her medication was not changed last visit.  Lab Results  Component Value Date   TSH 1.32 11/30/2018  .   Current Medications:    Current Facility-Administered Medications (Endocrine & Metabolic):  .  dexamethasone (DECADRON) injection 10 mg  Current Outpatient Medications (Cardiovascular):  .  bisoprolol (ZEBETA) 5 MG tablet, Take 1 tablet Daily for BP .  diltiazem (CARDIZEM CD) 360 MG 24 hr capsule, TAKE 1 CAPSULE BY MOUTH EVERY DAY .  furosemide (LASIX) 40 MG tablet, TAKE 2 TO 3 TABLETS DAILY AS DIRECTED FOR BP & FLUID SWELLING .  metolazone (ZAROXOLYN) 2.5 MG tablet, As needed for increase in swelling (Patient taking differently: Take 2.5 mg by mouth once a week. As needed for increase in swelling) .  rosuvastatin (CRESTOR) 10 MG tablet, Take 1 tablet Daily for Cholesterol .  spironolactone (ALDACTONE) 25 MG tablet, TAKE 1/2 TABLET BY MOUTH EVERY DAY   Current Outpatient Medications (Respiratory):  .  albuterol (PROAIR HFA) 108 (90 Base) MCG/ACT inhaler, Inhale 2 puffs into the lungs every 6 (six) hours as needed for wheezing or shortness of breath. Marland Kitchen  albuterol (PROVENTIL) (2.5 MG/3ML) 0.083% nebulizer solution, Take 3 mLs (2.5 mg total) by nebulization every 6 (six) hours as needed for wheezing or shortness of breath. Marland Kitchen  ipratropium-albuterol (DUONEB) 0.5-2.5 (3) MG/3ML SOLN, TAKE 3 MLS BY NEBULIZATION EVERY 4 (FOUR) HOURS AS NEEDED. MAX:6 DOSES PER DAY .  TRELEGY ELLIPTA 100-62.5-25 MCG/INH AEPB, INHALE 1 PUFF INTO THE LUNGS ONCE A DAY  Current Facility-Administered Medications (Respiratory):  .  ipratropium-albuterol (DUONEB) 0.5-2.5 (3) MG/3ML nebulizer solution 3 mL .   ipratropium-albuterol (DUONEB) 0.5-2.5 (3) MG/3ML nebulizer solution 3 mL  Current Outpatient Medications (Analgesics):  .  aspirin-acetaminophen-caffeine (EXCEDRIN MIGRAINE) 250-250-65 MG per tablet, Take 1 tablet by mouth every 6 (six) hours as needed for headache. Marland Kitchen  HYDROcodone-acetaminophen (NORCO/VICODIN) 5-325 MG tablet, Take 1 tablet by mouth every 6 (six) hours as needed for severe pain. .  naproxen sodium (ANAPROX) 220 MG tablet, Take 220 mg by mouth 2 (two) times daily as needed (back pain).   Current Outpatient Medications (Hematological):  Marland Kitchen  XARELTO 20 MG TABS tablet, TAKE 1 TABLET DAILY TO PREVENT BLOOD CLOTS   Current Outpatient Medications (Other):  Marland Kitchen  ALPRAZolam (XANAX) 0.25 MG tablet, 1-2 tablets as needed BID for panic attack- Suggest follow up in next 2 weeks if need more medication. .  chlorhexidine (PERIDEX) 0.12 % solution, Use as directed 15 mLs in the mouth or throat 2 (two) times daily. .  citalopram (CELEXA) 40 MG tablet, Take 1 tablet Daily for Mood .  diphenhydrAMINE HCl, Sleep, (SLEEP AID) 50 MG CAPS, Take 1 capsule by mouth daily. Marland Kitchen  gabapentin (NEURONTIN) 300 MG capsule, TAKE 1 CAPSULE BY MOUTH THREE TIMES A DAY .  nystatin cream (MYCOSTATIN), Apply 1 application topically 2 (two) times daily. (Patient taking differently: Apply 1 application topically 2 (two) times daily as needed for dry skin (ON FEET). ) .  polyethylene glycol (MIRALAX / GLYCOLAX) packet, Take 17 g by mouth daily as needed for moderate constipation.  .  potassium chloride (K-DUR) 10 MEQ tablet, Take 2 tablets 2 x /day for Potassium & take additional 2 tablets with additional dose of Lasix .  terbinafine (LAMISIL) 250 MG tablet, TAKE 1 TABLET BY MOUTH EVERY DAY .  traZODone (DESYREL) 100 MG tablet, Take 1 tablet at Bedtime for Sleep .  triamcinolone cream (KENALOG) 0.5 %, Apply 1 application topically 2 (two) times daily. (Patient taking differently: Apply 1 application topically 2 (two) times  daily as needed (IRRITATION). )   Medical History:  Past Medical History:  Diagnosis Date  . Anemia   . Anxiety   . Atrial flutter ([place])   . Borderline diabetes   . Chronic diastolic heart failure ([place])   . COPD (chronic obstructive pulmonary disease) ([place])   . Depression   . GERD (gastroesophageal reflux disease)   . HTN (hypertension)   . Hyperlipidemia   . Obstructive sleep apnea   . Persistent atrial fibrillation ([place])    Myoview 4/09: No ischemia;  echo 4/09: EF 60-65%;   Flecainide, beta blocker, Coumadin;    Unable to take Tikosyn 2/2 prolonged QT  . Pulmonary embolism (HCC)    Right lower lobe diagnosed by CT 6/12  . Type II or unspecified type diabetes mellitus without mention of complication, not stated as uncontrolled    Allergies:  Allergies  Allergen Reactions  . Sulfonamide Derivatives Hives and Itching  . Tikosyn [Dofetilide]     Prolonged qt    Review of Systems:  See HPI  Family history- Review and unchanged Social history- Review and unchanged Physical Exam: BP 124/78   Pulse 81   Temp (!) 97.3 F (36.3 C)   Ht _0  (1.499 m)   Wt 254 lb 9.6 oz (115.5 kg)   SpO2 94%   BMI 51.42 kg/m  Wt Readings from Last 3 Encounters:  03/12/19 254 lb 9.6 oz (115.5 kg)  01/22/19 248 lb 9.6 oz (112.8 kg)  11/30/18 245 lb (111.1 kg)   General Appearance: Well nourished, obese, in no apparent distress. Eyes: PERRLA, EOMs, conjunctiva no swelling or erythema Sinuses: No Frontal/maxillary tenderness ENT/Mouth: Ext aud canals clear, TMs without erythema, bulging. No erythema, swelling, or exudate on post pharynx.Tonsils not swollen or erythematous. Hearing normal. Crowded mouth Neck: Supple, thyroid normal.  Respiratory: Decreased breath sounds bilateral, without wheezing, rhonchi or rales Cardio: Irreg, irreg,  1+ bilateral edema Abdomen: Soft, + BS, obese,  Non tender, no guarding, rebound, hernias, masses. Lymphatics: Non tender without lymphadenopathy.   Musculoskeletal: Full ROM, 5/5 strength, antalgic gait Skin:   some thickening at toes, no ulcers. Warm, dry without rashes, lesions, ecchymosis.  Neuro: Cranial nerves intact. No cerebellar symptoms.  Psych: Awake and oriented X 3,  Insight and Judgment appropriate.    Vicie Mutters, PA-C 7:27 PM [facility]

## 2019-03-12 ENCOUNTER — Encounter: Payer: Self-pay | Admitting: Physician Assistant

## 2019-03-12 ENCOUNTER — Other Ambulatory Visit: Payer: Self-pay

## 2019-03-12 ENCOUNTER — Ambulatory Visit: Payer: BC Managed Care – PPO | Admitting: Physician Assistant

## 2019-03-12 VITALS — BP 124/78 | HR 81 | Temp 97.3°F | Ht 59.0 in | Wt 254.6 lb

## 2019-03-12 DIAGNOSIS — I5033 Acute on chronic diastolic (congestive) heart failure: Secondary | ICD-10-CM

## 2019-03-12 DIAGNOSIS — N184 Chronic kidney disease, stage 4 (severe): Secondary | ICD-10-CM

## 2019-03-12 DIAGNOSIS — Z79899 Other long term (current) drug therapy: Secondary | ICD-10-CM

## 2019-03-12 DIAGNOSIS — G4733 Obstructive sleep apnea (adult) (pediatric): Secondary | ICD-10-CM

## 2019-03-12 DIAGNOSIS — E038 Other specified hypothyroidism: Secondary | ICD-10-CM

## 2019-03-12 DIAGNOSIS — I4819 Other persistent atrial fibrillation: Secondary | ICD-10-CM | POA: Diagnosis not present

## 2019-03-12 DIAGNOSIS — F331 Major depressive disorder, recurrent, moderate: Secondary | ICD-10-CM

## 2019-03-12 DIAGNOSIS — J449 Chronic obstructive pulmonary disease, unspecified: Secondary | ICD-10-CM

## 2019-03-12 DIAGNOSIS — E1122 Type 2 diabetes mellitus with diabetic chronic kidney disease: Secondary | ICD-10-CM

## 2019-03-12 DIAGNOSIS — I4892 Unspecified atrial flutter: Secondary | ICD-10-CM | POA: Diagnosis not present

## 2019-03-12 DIAGNOSIS — E785 Hyperlipidemia, unspecified: Secondary | ICD-10-CM

## 2019-03-12 DIAGNOSIS — I159 Secondary hypertension, unspecified: Secondary | ICD-10-CM

## 2019-03-12 DIAGNOSIS — I2723 Pulmonary hypertension due to lung diseases and hypoxia: Secondary | ICD-10-CM

## 2019-03-12 DIAGNOSIS — E559 Vitamin D deficiency, unspecified: Secondary | ICD-10-CM

## 2019-03-12 NOTE — Patient Instructions (Signed)
Nuts are a healthy fat that can help you feel full HOWEVER they pack a lot of calories in a small amount. For weight loss, I suggest no more than 1 serving of nuts a day. A handful goes a long way. Here are some examples of the different types of nuts and the suggested serving a day.     General eating tips  What to Avoid . Avoid added sugars o Often added sugar can be found in processed foods such as many condiments, dry cereals, cakes, cookies, chips, crisps, crackers, candies, sweetened drinks, etc.  o Read labels and AVOID/DECREASE use of foods with the following in their ingredient list: Sugar, fructose, high fructose corn syrup, sucrose, glucose, maltose, dextrose, molasses, cane sugar, brown sugar, any type of syrup, agave nectar, etc.   . Avoid snacking in between meals- drink water or if you feel you need a snack, pick a high water content snack such as cucumbers, watermelon, or any veggie.  Marland Kitchen Avoid foods made with flour o If you are going to eat food made with flour, choose those made with whole-grains; and, minimize your consumption as much as is tolerable . Avoid processed foods o These foods are generally stocked in the middle of the grocery store.  o Focus on shopping on the perimeter of the grocery.  What to Include . Vegetables o GREEN LEAFY VEGETABLES: Kale, spinach, mustard greens, collard greens, cabbage, broccoli, etc. o OTHER: Asparagus, cauliflower, eggplant, carrots, peas, Brussel sprouts, tomatoes, bell peppers, zucchini, beets, cucumbers, etc. . Grains, seeds, and legumes o Beans: kidney beans, black eyed peas, garbanzo beans, black beans, pinto beans, etc. o Whole, unrefined grains: brown rice, barley, bulgur, oatmeal, etc. . Healthy fats  o Avoid highly processed fats such as vegetable oil o Examples of healthy fats: avocado, olives, virgin olive oil, dark chocolate (?72% Cocoa), nuts (peanuts, almonds, walnuts, cashews, pecans, etc.) o Please still do small  amount of these healthy fats, they are dense in calories.  . Low - Moderate Intake of Animal Sources of Protein o Meat sources: chicken, [place], salmon, tuna. Limit to 4 ounces of meat at one time or the size of your palm. o Consider limiting dairy sources, but when choosing dairy focus on: PLAIN [place] yogurt, cottage cheese, high-protein milk . Fruit o Choose berries   Dining out: help on how to choose  It is better if you can meal plan and eat at your house but sometimes life happens so here are some tips to help you make healthier choices while eating out:  1) Ask for your server to pack up 1/2 of your meal to take home for lunch or later. Portion sizes are huge so if you don't have all of it in front of you, you are less likely to eat it all.    2) Ask if they have a smaller portion or lunch portion available, this is often cheaper as well!  3) Ask to not have the bread basket brought to the table  4) Ask for the dressing on the side.   5) Look at the nutrition menu BEFORE you go to a restaurant or fast food chain and have what you will eat in mind. You can also look it up on food tracking ups like Myfitness Pal or Lost it. However the web site for the restaurant is usually the best bet.   6) Don't forget that you are the costumer, it is okay to ask them to leave things out, ask  about substituting, or ask them to cook with less oil!  Here is some more general information for you!

## 2019-03-13 LAB — HEMOGLOBIN A1C
Hgb A1c MFr Bld: 6.5 % of total Hgb — ABNORMAL HIGH (ref <5.7)
Mean Plasma Glucose: 140 (calc)
eAG (mmol/L): 7.7 (calc)

## 2019-03-13 LAB — COMPLETE METABOLIC PANEL WITH GFR
AG Ratio: 1.8 (calc) (ref 1.0–2.5)
ALT: 22 U/L (ref 6–29)
AST: 13 U/L (ref 10–35)
Albumin: 4.3 g/dL (ref 3.6–5.1)
Alkaline phosphatase (APISO): 86 U/L (ref 37–153)
BUN/Creatinine Ratio: 20 (calc) (ref 6–22)
BUN: 30 mg/dL — ABNORMAL HIGH (ref 7–25)
CO2: 27 mmol/L (ref 20–32)
Calcium: 9.5 mg/dL (ref 8.6–10.4)
Chloride: 107 mmol/L (ref 98–110)
Creat: 1.49 mg/dL — ABNORMAL HIGH (ref 0.50–0.99)
GFR, Est African American: 44 mL/min/{1.73_m2} — ABNORMAL LOW (ref >=60)
GFR, Est Non African American: 38 mL/min/{1.73_m2} — ABNORMAL LOW (ref >=60)
Globulin: 2.4 g/dL (calc) (ref 1.9–3.7)
Glucose, Bld: 122 mg/dL — ABNORMAL HIGH (ref 65–99)
Potassium: 4.1 mmol/L (ref 3.5–5.3)
Sodium: 144 mmol/L (ref 135–146)
Total Bilirubin: 0.3 mg/dL (ref 0.2–1.2)
Total Protein: 6.7 g/dL (ref 6.1–8.1)

## 2019-03-13 LAB — CBC WITH DIFFERENTIAL/PLATELET
Absolute Monocytes: 451 cells/uL (ref 200–950)
Basophils Absolute: 22 cells/uL (ref 0–200)
Basophils Relative: 0.3 %
Eosinophils Absolute: 81 cells/uL (ref 15–500)
Eosinophils Relative: 1.1 %
HCT: 40.2 % (ref 35.0–45.0)
Hemoglobin: 13.4 g/dL (ref 11.7–15.5)
Lymphs Abs: 1732 cells/uL (ref 850–3900)
MCH: 28.5 pg (ref 27.0–33.0)
MCHC: 33.3 g/dL (ref 32.0–36.0)
MCV: 85.4 fL (ref 80.0–100.0)
MPV: 11.9 fL (ref 7.5–12.5)
Monocytes Relative: 6.1 %
Neutro Abs: 5113 cells/uL (ref 1500–7800)
Neutrophils Relative %: 69.1 %
Platelets: 229 10*3/uL (ref 140–400)
RBC: 4.71 10*6/uL (ref 3.80–5.10)
RDW: 13.6 % (ref 11.0–15.0)
Total Lymphocyte: 23.4 %
WBC: 7.4 10*3/uL (ref 3.8–10.8)

## 2019-03-13 LAB — MAGNESIUM: Magnesium: 2.3 mg/dL (ref 1.5–2.5)

## 2019-03-13 LAB — LIPID PANEL
Cholesterol: 135 mg/dL (ref <200)
HDL: 46 mg/dL — ABNORMAL LOW (ref >=50)
LDL Cholesterol (Calc): 74 mg/dL (calc)
Non-HDL Cholesterol (Calc): 89 mg/dL (calc) (ref <130)
Total CHOL/HDL Ratio: 2.9 (calc) (ref <5.0)
Triglycerides: 72 mg/dL (ref <150)

## 2019-03-13 LAB — VITAMIN D 25 HYDROXY (VIT D DEFICIENCY, FRACTURES): Vit D, 25-Hydroxy: 28 ng/mL — ABNORMAL LOW (ref 30–100)

## 2019-03-13 LAB — TSH: TSH: 2.67 mIU/L (ref 0.40–4.50)

## 2019-03-19 ENCOUNTER — Other Ambulatory Visit: Payer: Self-pay | Admitting: Physician Assistant

## 2019-03-19 DIAGNOSIS — G8929 Other chronic pain: Secondary | ICD-10-CM

## 2019-03-19 DIAGNOSIS — M545 Low back pain, unspecified: Secondary | ICD-10-CM

## 2019-03-31 ENCOUNTER — Ambulatory Visit (HOSPITAL_COMMUNITY)
Admission: EM | Admit: 2019-03-31 | Discharge: 2019-03-31 | Disposition: A | Discharge status: Home / Self Care | Payer: 59 | Attending: Family Medicine | Admitting: Family Medicine

## 2019-03-31 ENCOUNTER — Encounter (HOSPITAL_COMMUNITY): Payer: Self-pay | Admitting: *Deleted

## 2019-03-31 ENCOUNTER — Other Ambulatory Visit: Payer: Self-pay

## 2019-03-31 DIAGNOSIS — R21 Rash and other nonspecific skin eruption: Secondary | ICD-10-CM

## 2019-03-31 DIAGNOSIS — M542 Cervicalgia: Secondary | ICD-10-CM

## 2019-03-31 MED ORDER — TRAMADOL HCL 50 MG PO TABS: 50.0000 mg | ORAL_TABLET | Freq: Two times a day (BID) | ORAL | 0 refills | Status: AC | PRN

## 2019-03-31 MED ORDER — VALACYCLOVIR HCL 1 G PO TABS: 1000.0000 mg | ORAL_TABLET | Freq: Three times a day (TID) | ORAL | 0 refills | Status: DC

## 2019-03-31 MED ORDER — TIZANIDINE HCL 4 MG PO TABS: 4.0000 mg | ORAL_TABLET | Freq: Four times a day (QID) | ORAL | 0 refills | Status: DC | PRN

## 2019-03-31 NOTE — Discharge Instructions (Addendum)
Treating you for shingles Take the medications as prescribed.  Zanaflex for muscle relaxant and tramadol for severe pain and to help with rest.  Follow up as needed for continued or worsening symptoms

## 2019-03-31 NOTE — ED Triage Notes (Addendum)
Reports starting with right earache 2 wks ago, which has since spread into severe right HA, pain in base of right neck, scalp tenderness.  States unable to lay down due to pain.  States does feel like migraines she used to have.  Also c/o nausea, and slight runny nose.  Denies fevers or vision changes.  C/O slight photosensitivity. Has tried lidocaine with essential oils, Advil, OTC Tension HA Relief, muscle rub, and Excedrin Migraine without any relief. Declines Covid test.

## 2019-04-01 NOTE — ED Provider Notes (Signed)
[place]    CSN: [id number] Arrival date & time: 03/31/19  1505      History   Chief Complaint Chief Complaint  Patient presents with  . Appointment    1500  . Headache    HPI [name] is a 59 y.o. female.   Patient is a 59 year old female with past medical history of anemia, anxiety, a flutter, diabetes, COPD, depression, GERD, hypertension, hyperlipidemia, sleep apnea, A. fib, PE.  She presents today with right earache, neck pain, headache and pain in the base of the right neck and scalp.  Symptoms have been constant, waxing waning for the past week or more. Noticed a rash 3 days ago.  Does have history of migraines.  Denies any injuries to the neck or falls.  Also having some slight nausea and rhinorrhea.  Describes the pain as "nerve pain" and burning at times.  Mild itching.  Has tried lidocaine, essential's, Advil, over-the-counter tension headache relief, muscle rub, Excedrin Migraine without any relief. Also has been using heating pad. No fever.  ROS per HPI      Past Medical History:  Diagnosis Date  . Anemia   . Anxiety   . Atrial flutter ([place])   . Borderline diabetes   . Chronic diastolic heart failure ([place])   . COPD (chronic obstructive pulmonary disease) ([place])   . Depression   . GERD (gastroesophageal reflux disease)   . HTN (hypertension)   . Hyperlipidemia   . Obstructive sleep apnea   . Persistent atrial fibrillation ([place])    Myoview 4/09: No ischemia;  echo 4/09: EF 60-65%;   Flecainide, beta blocker, Coumadin;    Unable to take Tikosyn 2/2 prolonged QT  . Pulmonary embolism (HCC)    Right lower lobe diagnosed by CT 6/12  . Type II or unspecified type diabetes mellitus without mention of complication, not stated as uncontrolled     Patient Active Problem List   Diagnosis Date Noted  . Class 3 severe obesity due to excess calories with serious comorbidity and body mass index (BMI) of 45.0 to 49.9 in adult ([place]) 11/27/2018  .  Obstructive sleep apnea treated with continuous positive airway pressure (CPAP) 11/27/2018  . CKD (chronic kidney disease) stage 4, GFR 15-29 ml/min (HCC) 11/06/2017  . CKD stage 4 due to type 2 diabetes mellitus ([place]) 08/04/2016  . Pulmonary hypertension due to COPD ([place]) 08/04/2016  . Noncompliance 12/18/2013  . Vitamin D deficiency 10/22/2013  . Morbid obesity (BMI 49.6) 10/22/2013  . Hyperlipidemia   . HTN (hypertension)   . Diabetes type 2, uncontrolled ([place])   . Anxiety   . Anemia   . Hypothyroid 07/18/2013  . Atrial fibrillation ([place]) 01/27/2012  . COPD (chronic obstructive pulmonary disease) ([place]) 08/05/2010  . CHF NYHA class III (symptoms with mildly strenuous activities), acute on chronic, diastolic ([place])   . History of pulmonary embolus (PE)   . Atrial flutter ([place])   . OSA and COPD overlap syndrome ([place]) 07/21/2007  . Depression, major, recurrent, moderate ([place]) 07/20/2007  . G E R D 07/20/2007    Past Surgical History:  Procedure Laterality Date  . basal skin can    . CARDIOVERSION  01/19/2012   Procedure: CARDIOVERSION;  Surgeon: Jolaine Artist, MD;  Location: [facility] ENDOSCOPY;  Service: Cardiovascular;  Laterality: N/A;  . CARDIOVERSION  01/21/2012   Procedure: CARDIOVERSION;  Surgeon: Carlena Bjornstad, MD;  Location: [facility];  Service: Cardiovascular;  Laterality: N/A;  Rm [id number]  .  TEE WITHOUT CARDIOVERSION  01/19/2012   Procedure: TRANSESOPHAGEAL ECHOCARDIOGRAM (TEE);  Surgeon: Jolaine Artist, MD;  Location: [facility] ENDOSCOPY;  Service: Cardiovascular;  Laterality: N/A;  . TUBAL LIGATION  1996    OB History   No obstetric history on file.      Home Medications    Prior to Admission medications   Medication Sig Start Date End Date Taking? Authorizing Provider  ALPRAZolam Duanne Moron) 0.25 MG tablet 1-2 tablets as needed BID for panic attack- Suggest follow up in next 2 weeks if need more medication. 04/11/18  Yes McClanahan, Danton Sewer, NP    aspirin-acetaminophen-caffeine (EXCEDRIN MIGRAINE) 250-250-65 MG per tablet Take 1 tablet by mouth every 6 (six) hours as needed for headache.   Yes [provider]  bisoprolol (ZEBETA) 5 MG tablet Take 1 tablet Daily for BP 10/21/18  Yes Unk Pinto, MD  diltiazem (CARDIZEM CD) 360 MG 24 hr capsule TAKE 1 CAPSULE BY MOUTH EVERY DAY 12/19/18  Yes Bensimhon, Shaune Pascal, MD  diphenhydrAMINE HCl, Sleep, (SLEEP AID) 50 MG CAPS Take 1 capsule by mouth daily.   Yes [provider]  furosemide (LASIX) 40 MG tablet TAKE 2 TO 3 TABLETS DAILY AS DIRECTED FOR BP & FLUID SWELLING 02/12/19  Yes Unk Pinto, MD  gabapentin (NEURONTIN) 300 MG capsule Take 1 capsule 3 x /day for Chronic Back Pain 03/19/19  Yes Unk Pinto, MD  metolazone (ZAROXOLYN) 2.5 MG tablet As needed for increase in swelling Patient taking differently: Take 2.5 mg by mouth once a week. As needed for increase in swelling 06/14/16  Yes Bensimhon, Shaune Pascal, MD  naproxen sodium (ANAPROX) 220 MG tablet Take 220 mg by mouth 2 (two) times daily as needed (back pain).   Yes [provider]  potassium chloride (K-DUR) 10 MEQ tablet Take 2 tablets 2 x /day for Potassium & take additional 2 tablets with additional dose of Lasix 10/14/18  Yes Unk Pinto, MD  rosuvastatin (CRESTOR) 10 MG tablet Take 1 tablet Daily for Cholesterol 12/23/18  Yes Unk Pinto, MD  spironolactone (ALDACTONE) 25 MG tablet TAKE 1/2 TABLET BY MOUTH EVERY DAY 01/11/19  Yes Bensimhon, Shaune Pascal, MD  traZODone (DESYREL) 100 MG tablet Take 1 tablet at Bedtime for Sleep 03/09/19  Yes Unk Pinto, MD  TRELEGY ELLIPTA 100-62.5-25 MCG/INH AEPB INHALE 1 PUFF INTO THE LUNGS ONCE A DAY 01/03/19  Yes Unk Pinto, MD  XARELTO 20 MG TABS tablet TAKE 1 TABLET DAILY TO PREVENT BLOOD CLOTS 01/11/19  Yes Unk Pinto, MD  albuterol (PROAIR HFA) 108 (90 Base) MCG/ACT inhaler Inhale 2 puffs into the lungs every 6 (six) hours as needed for wheezing  or shortness of breath. 07/26/17   Liane Comber, NP  albuterol (PROVENTIL) (2.5 MG/3ML) 0.083% nebulizer solution Take 3 mLs (2.5 mg total) by nebulization every 6 (six) hours as needed for wheezing or shortness of breath. 07/29/16   Vicie Mutters, PA-C  chlorhexidine (PERIDEX) 0.12 % solution Use as directed 15 mLs in the mouth or throat 2 (two) times daily. 05/21/18   Ok Edwards, PA-C  citalopram (CELEXA) 40 MG tablet Take 1 tablet Daily for Mood 03/09/19   Unk Pinto, MD  HYDROcodone-acetaminophen (NORCO/VICODIN) 5-325 MG tablet Take 1 tablet by mouth every 6 (six) hours as needed for severe pain. 05/21/18   Yu, Amy V, PA-C  ipratropium-albuterol (DUONEB) 0.5-2.5 (3) MG/3ML SOLN TAKE 3 MLS BY NEBULIZATION EVERY 4 (FOUR) HOURS AS NEEDED. MAX:6 DOSES PER DAY 05/02/18   Garnet Sierras, NP  nystatin  cream (MYCOSTATIN) Apply 1 application topically 2 (two) times daily. Patient taking differently: Apply 1 application topically 2 (two) times daily as needed for dry skin (ON FEET).  01/05/17   Vicie Mutters, PA-C  polyethylene glycol [facility] / Floria Raveling) packet Take 17 g by mouth daily as needed for moderate constipation.     [provider]  terbinafine (LAMISIL) 250 MG tablet Take 1 tablet Daily for Toenail Fungus 03/19/19   Unk Pinto, MD  tiZANidine (ZANAFLEX) 4 MG tablet Take 1 tablet (4 mg total) by mouth every 6 (six) hours as needed for muscle spasms. 03/31/19   Loura Halt A, NP  traMADol (ULTRAM) 50 MG tablet Take 1 tablet (50 mg total) by mouth every 12 (twelve) hours as needed for up to 3 days. 03/31/19 04/03/19  Loura Halt A, NP  triamcinolone cream (KENALOG) 0.5 % Apply 1 application topically 2 (two) times daily. Patient taking differently: Apply 1 application topically 2 (two) times daily as needed (IRRITATION).  01/05/17   Vicie Mutters, PA-C  valACYclovir (VALTREX) 1000 MG tablet Take 1 tablet (1,000 mg total) by mouth 3 (three) times daily. 03/31/19   Orvan July, NP     Family History Family History  Problem Relation Age of Onset  . Colon cancer Mother   . Coronary artery disease Mother   . Coronary artery disease Father   . Asthma Father   . Emphysema Father   . Allergies Father   . Heart attack Maternal Grandfather   . Heart disease Maternal Grandmother   . Depression Daughter     Social History Social History   Tobacco Use  . Smoking status: Former Smoker    Packs/day: 2.00    Years: 35.00    Pack years: 70.00    Types: Cigarettes    Quit date: 09/08/2009    Years since quitting: 9.5  . Smokeless tobacco: Never Used  Substance Use Topics  . Alcohol use: No  . Drug use: No     Allergies   Sulfonamide derivatives and Tikosyn [dofetilide]   Review of Systems Review of Systems   Physical Exam Triage Vital Signs ED Triage Vitals [03/31/19 1538]  Enc Vitals Group     BP (!) 114/59     Pulse Rate 93     Resp 20     Temp 98.1 F (36.7 C)     Temp Source Oral     SpO2 97 %     Weight      Height      Head Circumference      Peak Flow      Pain Score 9     Pain Loc      Pain Edu?      Excl. in [place]?    No data found.  Updated Vital Signs BP (!) 114/59   Pulse 93   Temp 98.1 F (36.7 C) (Oral)   Resp 20   SpO2 97%   Visual Acuity Right Eye Distance:   Left Eye Distance:   Bilateral Distance:    Right Eye Near:   Left Eye Near:    Bilateral Near:     Physical Exam Vitals and nursing note reviewed.  Constitutional:      Appearance: She is well-developed.     Comments: Appears in pain  HENT:     Head: Normocephalic and atraumatic.      Comments: Multiple clusters of papular erythematous rash to right lateral neck behind the right ear and into  chest    Nose: Nose normal.  Pulmonary:     Effort: Pulmonary effort is normal.  Musculoskeletal:        General: Normal range of motion.     Cervical back: Normal range of motion.  Skin:    General: Skin is warm and dry.     Findings: Rash present.    Neurological:     Mental Status: She is alert.  Psychiatric:        Mood and Affect: Mood normal.      UC Treatments / Results  Labs (all labs ordered are listed, but only abnormal results are displayed) Labs Reviewed - No data to display  EKG   Radiology No results found.  Procedures Procedures (including critical care time)  Medications Ordered in UC Medications - No data to display  Initial Impression / Assessment and Plan / UC Course  I have reviewed the triage vital signs and the nursing notes.  Pertinent labs & imaging results that were available during my care of the patient were reviewed by me and considered in my medical decision making (see chart for details).     Rash and neck pain-symptoms and exam most consistent with herpes zoster Treating with Valtrex Tramadol for pain as needed.  Recommended not taking her trazodone while taking the tramadol. Zanaflex for muscle relaxant to see if this helps.  Patient already takes gabapentin daily for nerve pain. You may follow-up for any continued or worsening symptoms.  Final Clinical Impressions(s) / UC Diagnoses   Final diagnoses:  Rash and nonspecific skin eruption  Neck pain     Discharge Instructions     Treating you for shingles Take the medications as prescribed.  Zanaflex for muscle relaxant and tramadol for severe pain and to help with rest.  Follow up as needed for continued or worsening symptoms     ED Prescriptions    Medication Sig Dispense Auth. Provider   valACYclovir (VALTREX) 1000 MG tablet Take 1 tablet (1,000 mg total) by mouth 3 (three) times daily. 21 tablet Malai Lady A, NP   tiZANidine (ZANAFLEX) 4 MG tablet Take 1 tablet (4 mg total) by mouth every 6 (six) hours as needed for muscle spasms. 30 tablet Lauris Keepers A, NP   traMADol (ULTRAM) 50 MG tablet Take 1 tablet (50 mg total) by mouth every 12 (twelve) hours as needed for up to 3 days. 6 tablet Eleah Lahaie A, NP     I have  reviewed the PDMP during this encounter.   Orvan July, NP 04/01/19 1422

## 2019-04-04 NOTE — Progress Notes (Signed)
Assessment and Plan:  Tiari was seen today for herpes zoster.  Diagnoses and all orders for this visit:  Neck pain on right side Post herpetic neuralgia In light of recent shingles dx, burning character suggests post-herpetic neuralgia 3 weeks of pain prior to identification of rash; unclear if this was late diagnosis and treatment or if there is other underlying etiology of neck pain such as neck muscle strain. Discussed gabapentin is best supported treatment for post-herpetic neuralgia; encouraged her to restart this; suggested trying 1 cap of 300 mg twice during the day, 2 caps at night for sleep. Avoiding high doses due to CKD. She may also try topical lidocaine in general area prior to application of CPAP, possible benfit with this intervention, though reviewed limited data available.  She may continue with gentle ROM, zanaflex for neck IF SHE FINDS THIS BENFICIAL Discussed post-herpetic neuralgia may persist weeks, months, or indefinitely in cases of late diagnosis/antiviral treatment However if persistent sx may consider referral to specialist to see if she might qualify for alternate injection based therapies.  She is appreciative and in agreement with the above plan.   Further disposition pending results of labs. Discussed med's effects and SE's.   Over 30 minutes of exam, counseling, chart review, and critical decision making was performed.   Future Appointments  Date Time Provider [place]  07/10/2019  8:45 AM Vicie Mutters, PA-C [facility] None  11/28/2019  2:00 PM Ward Givens, NP [facility] None  12/03/2019 10:00 AM Vicie Mutters, PA-C [facility] None    ------------------------------------------------------------------------------------------------------------------   HPI BP 112/70   Pulse 75   Temp (!) 97.5 F (36.4 C)   Wt 248 lb 9.6 oz (112.8 kg)   SpO2 96%   BMI 50.21 kg/m   59 y.o.female with hx of T2DM and CKD, depression/anxiety, morbid obesity  with OSA on CPAP presents for evaluation due to persistent pain to right neck/scalp following shingles diagnosis disrupting sleep.   She reports 3 week history of R lateral neck pain, though she slept wrong but was getting worse despite conservative interventions and presented to UC for evaluation on 03/31/2019 where they noted rash, was diagnosed with shingles and initiated on valtrex. She was prescribed tramadol 50 mg x 6 tabs to take PRN for pain which she has completed. She was already taking gabapentin for chronic pain.   She presents today for management due to residual pain significantly limiting her ability to sleep due to OSA and mask over previous rash area causing significant discomfort. She describes pain as burning, shooting in character. She reports has been trying zanaflex, excedrine for her neck discomfort without significant improvement in ability to sleep. She has some remotely leftover norco which she has take 1/2 tab intermittently for sleep. She reports hasn't been taking gabapentin.   Last A1C in the office was:  Lab Results  Component Value Date   HGBA1C 6.5 (H) 03/12/2019   Last GFR:  Lab Results  Component Value Date   GFRNONAA 38 (L) 03/12/2019     Past Medical History:  Diagnosis Date  . Anemia   . Anxiety   . Atrial flutter ([place])   . Borderline diabetes   . Chronic diastolic heart failure ([place])   . COPD (chronic obstructive pulmonary disease) ([place])   . Depression   . GERD (gastroesophageal reflux disease)   . HTN (hypertension)   . Hyperlipidemia   . Obstructive sleep apnea   . Persistent atrial fibrillation ([place])    Myoview 4/09: No  ischemia;  echo 4/09: EF 60-65%;   Flecainide, beta blocker, Coumadin;    Unable to take Tikosyn 2/2 prolonged QT  . Pulmonary embolism (HCC)    Right lower lobe diagnosed by CT 6/12  . Type II or unspecified type diabetes mellitus without mention of complication, not stated as uncontrolled      Allergies  Allergen  Reactions  . Sulfonamide Derivatives Hives and Itching  . Tikosyn [Dofetilide]     Prolonged qt    Current Outpatient Medications on File Prior to Visit  Medication Sig  . albuterol (PROAIR HFA) 108 (90 Base) MCG/ACT inhaler Inhale 2 puffs into the lungs every 6 (six) hours as needed for wheezing or shortness of breath.  Marland Kitchen albuterol (PROVENTIL) (2.5 MG/3ML) 0.083% nebulizer solution Take 3 mLs (2.5 mg total) by nebulization every 6 (six) hours as needed for wheezing or shortness of breath.  . ALPRAZolam (XANAX) 0.25 MG tablet 1-2 tablets as needed BID for panic attack- Suggest follow up in next 2 weeks if need more medication.  Marland Kitchen aspirin-acetaminophen-caffeine (EXCEDRIN MIGRAINE) 250-250-65 MG per tablet Take 1 tablet by mouth every 6 (six) hours as needed for headache.  . bisoprolol (ZEBETA) 5 MG tablet Take 1 tablet Daily for BP  . chlorhexidine (PERIDEX) 0.12 % solution Use as directed 15 mLs in the mouth or throat 2 (two) times daily.  . citalopram (CELEXA) 40 MG tablet Take 1 tablet Daily for Mood  . diltiazem (CARDIZEM CD) 360 MG 24 hr capsule TAKE 1 CAPSULE BY MOUTH EVERY DAY  . diphenhydrAMINE HCl, Sleep, (SLEEP AID) 50 MG CAPS Take 1 capsule by mouth daily.  . furosemide (LASIX) 40 MG tablet TAKE 2 TO 3 TABLETS DAILY AS DIRECTED FOR BP & FLUID SWELLING  . gabapentin (NEURONTIN) 300 MG capsule Take 1 capsule 3 x /day for Chronic Back Pain  . ipratropium-albuterol (DUONEB) 0.5-2.5 (3) MG/3ML SOLN TAKE 3 MLS BY NEBULIZATION EVERY 4 (FOUR) HOURS AS NEEDED. MAX:6 DOSES PER DAY  . metolazone (ZAROXOLYN) 2.5 MG tablet As needed for increase in swelling (Patient taking differently: Take 2.5 mg by mouth once a week. As needed for increase in swelling)  . naproxen sodium (ANAPROX) 220 MG tablet Take 220 mg by mouth 2 (two) times daily as needed (back pain).  . nystatin cream (MYCOSTATIN) Apply 1 application topically 2 (two) times daily. (Patient taking differently: Apply 1 application topically  2 (two) times daily as needed for dry skin (ON FEET). )  . polyethylene glycol (MIRALAX / GLYCOLAX) packet Take 17 g by mouth daily as needed for moderate constipation.   . potassium chloride (K-DUR) 10 MEQ tablet Take 2 tablets 2 x /day for Potassium & take additional 2 tablets with additional dose of Lasix  . rosuvastatin (CRESTOR) 10 MG tablet Take 1 tablet Daily for Cholesterol  . spironolactone (ALDACTONE) 25 MG tablet TAKE 1/2 TABLET BY MOUTH EVERY DAY  . terbinafine (LAMISIL) 250 MG tablet Take 1 tablet Daily for Toenail Fungus  . tiZANidine (ZANAFLEX) 4 MG tablet Take 1 tablet (4 mg total) by mouth every 6 (six) hours as needed for muscle spasms.  . traZODone (DESYREL) 100 MG tablet Take 1 tablet at Bedtime for Sleep  . TRELEGY ELLIPTA 100-62.5-25 MCG/INH AEPB INHALE 1 PUFF INTO THE LUNGS ONCE A DAY  . triamcinolone cream (KENALOG) 0.5 % Apply 1 application topically 2 (two) times daily. (Patient taking differently: Apply 1 application topically 2 (two) times daily as needed (IRRITATION). )  . valACYclovir (VALTREX) 1000  MG tablet Take 1 tablet (1,000 mg total) by mouth 3 (three) times daily.  Alveda Reasons 20 MG TABS tablet TAKE 1 TABLET DAILY TO PREVENT BLOOD CLOTS   Current Facility-Administered Medications on File Prior to Visit  Medication  . dexamethasone (DECADRON) injection 10 mg  . ipratropium-albuterol (DUONEB) 0.5-2.5 (3) MG/3ML nebulizer solution 3 mL  . ipratropium-albuterol (DUONEB) 0.5-2.5 (3) MG/3ML nebulizer solution 3 mL    ROS: all negative except above.   Physical Exam:  BP 112/70   Pulse 75   Temp (!) 97.5 F (36.4 C)   Wt 248 lb 9.6 oz (112.8 kg)   SpO2 96%   BMI 50.21 kg/m   General Appearance: Well nourished, morbidly obese female in no acute distress. Eyes: PERRLA, EOMs, conjunctiva no swelling or erythema ENT/Mouth: Ext aud canals clear, TMs without erythema, bulging. No appearance of shingles lesion in canal or on TM. Mask in place. Hearing normal.   Neck: Supple. Respiratory: Respiratory effort normal Cardio: RRR with no MRGs. Brisk peripheral pulses without edema.  Lymphatics: NO palpable lymphadenopathy; generalized tenderness of right neck.   Musculoskeletal: Full ROM, 5/5 strength, slow antalgic gate with cane. She has generalized tenderness of right lateral neck without palpable abnormality.   Skin: Warm, dry; mild ? Area of rash to right upper chest, no notable rash, erythema, lesions to right neck/scalp/ear.  Neuro: Cranial nerves intact. Normal muscle tone, no cerebellar symptoms.  Psych: Awake and oriented X 3, normal affect, Insight and Judgment appropriate.     Izora Ribas, NP 5:18 PM [facility]

## 2019-04-05 ENCOUNTER — Ambulatory Visit: Payer: 59 | Admitting: Adult Health

## 2019-04-05 ENCOUNTER — Other Ambulatory Visit: Payer: Self-pay

## 2019-04-05 ENCOUNTER — Encounter: Payer: Self-pay | Admitting: Adult Health

## 2019-04-05 VITALS — BP 112/70 | HR 75 | Temp 97.5°F | Wt 248.6 lb

## 2019-04-05 DIAGNOSIS — M542 Cervicalgia: Secondary | ICD-10-CM | POA: Diagnosis not present

## 2019-04-05 DIAGNOSIS — B0229 Other postherpetic nervous system involvement: Secondary | ICD-10-CM | POA: Diagnosis not present

## 2019-04-05 NOTE — Patient Instructions (Addendum)
Try gabapentin 1 tab 2 times during the day (f needed), 2 tabs at night  Can try the topical lidocaine   Use 1/2 tab of hydrocodone if needed at night to help with sleep   Don't take xanax with zanaflex with hydrocodone    Postherpetic Neuralgia Postherpetic neuralgia (PHN) is nerve pain that occurs after a shingles infection. Shingles is a painful rash that appears on one area of the body, usually on the trunk or face. Shingles is caused by the varicella-zoster virus. This is the same virus that causes chickenpox. In people who have had chickenpox, the virus can resurface years later and cause shingles. You may have PHN if you continue to have pain for 4 months after your shingles rash has gone away. PHN appears in the same area where you had the shingles rash. The pain usually goes away after the rash disappears. Getting a vaccination for shingles can prevent PHN. This vaccine is recommended for people older than 60. It may prevent shingles, and may also lower your risk of PHN if you do get shingles. What are the causes? This condition is caused by damage to your nerves from the varicella-zoster virus. The damage makes your nerves overly sensitive. What increases the risk? The following factors may make you more likely to develop this condition:  Being older than 61 years of age.  Having severe pain before your shingles rash starts.  Having a severe rash.  Having shingles in and around the eye area.  Having a disease that makes your body unable to fight infections (weak immune system). What are the signs or symptoms? The main symptom of this condition is pain. The pain may:  Often be very bad and may be described as stabbing, burning, or feeling like an electric shock.  Come and go or may be there all the time.  Be triggered by light touches on the skin or changes in temperature. You may have itching along with the pain. How is this diagnosed? This condition may be diagnosed  based on your symptoms and your history of shingles. Lab studies and other diagnostic tests are usually not needed. How is this treated? There is no cure for this condition. Treatment for PHN will focus on pain relief. Over-the-counter pain relievers do not usually relieve PHN pain. You may need to work with a pain specialist. Treatment may include:  Antidepressant medicines to help with pain and improve sleep.  Anti-seizure medicines to relieve nerve pain.  Strong pain relievers (opioids).  A numbing patch worn on the skin (lidocaine patch).  Botox (botulinum toxin) injections to block pain signals between nerves and muscles.  Injections of numbing medicine or anti-inflammatory medicines around irritated nerves. Follow these instructions at home:   It may take a long time to recover from PHN. Work closely with your health care provider and develop a good support system at home.  Take over-the-counter and prescription medicines only as told by your health care provider.  Do not drive or use heavy machinery while taking prescription pain medicine.  Wear loose, comfortable clothing.  Cover sensitive areas with a dressing to reduce friction from clothing rubbing on the area.  If directed, put ice on the painful area: ? Put ice in a plastic bag. ? Place a towel between your skin and the bag. ? Leave the ice on for 20 minutes, 2-3 times a day.  Talk to your health care provider if you feel depressed or desperate. Living with long-term pain can  be depressing.  Keep all follow-up visits as told by your health care provider. This is important. Contact a health care provider if:  Your medicine is not helping.  You are struggling to manage your pain at home. Summary  Postherpetic neuralgia is a very painful disorder that can occur after an episode of shingles.  The pain is often severe, burning, electric, or stabbing.  Prescription medicines can be helpful in managing persistent  pain.  Getting a vaccination for shingles can prevent PHN. This vaccine is recommended for people older than 60. This information is not intended to replace advice given to you by your health care provider. Make sure you discuss any questions you have with your health care provider. Document Revised: 01/07/2017 Document Reviewed: 04/13/2016 Elsevier Patient Education  [place].

## 2019-04-09 ENCOUNTER — Ambulatory Visit: Payer: 59 | Admitting: Physician Assistant

## 2019-04-09 ENCOUNTER — Other Ambulatory Visit (HOSPITAL_COMMUNITY): Payer: Self-pay | Admitting: Internal Medicine

## 2019-04-10 ENCOUNTER — Telehealth (HOSPITAL_COMMUNITY): Payer: Self-pay | Admitting: Emergency Medicine

## 2019-04-10 NOTE — Telephone Encounter (Signed)
Received a written message from patient access that patient had requested a work note  Printed and placed at patient access

## 2019-04-14 ENCOUNTER — Other Ambulatory Visit: Payer: Self-pay | Admitting: Physician Assistant

## 2019-04-14 ENCOUNTER — Other Ambulatory Visit: Payer: Self-pay | Admitting: Internal Medicine

## 2019-04-14 DIAGNOSIS — I5032 Chronic diastolic (congestive) heart failure: Secondary | ICD-10-CM

## 2019-04-14 MED ORDER — BISOPROLOL FUMARATE 5 MG PO TABS: ORAL_TABLET | ORAL | 0 refills | Status: DC

## 2019-04-28 ENCOUNTER — Other Ambulatory Visit: Payer: Self-pay | Admitting: Internal Medicine

## 2019-06-03 ENCOUNTER — Other Ambulatory Visit: Payer: Self-pay | Admitting: Physician Assistant

## 2019-06-17 ENCOUNTER — Other Ambulatory Visit (HOSPITAL_COMMUNITY): Payer: Self-pay | Admitting: Internal Medicine

## 2019-06-20 ENCOUNTER — Other Ambulatory Visit: Payer: Self-pay | Admitting: Internal Medicine

## 2019-06-20 DIAGNOSIS — I5032 Chronic diastolic (congestive) heart failure: Secondary | ICD-10-CM

## 2019-06-29 ENCOUNTER — Other Ambulatory Visit: Payer: Self-pay | Admitting: Internal Medicine

## 2019-06-29 DIAGNOSIS — I5032 Chronic diastolic (congestive) heart failure: Secondary | ICD-10-CM
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
## 2019-07-04 ENCOUNTER — Other Ambulatory Visit (HOSPITAL_COMMUNITY): Payer: Self-pay | Admitting: Internal Medicine

## 2019-07-10 ENCOUNTER — Encounter: Payer: Self-pay | Admitting: Physician Assistant

## 2019-07-10 ENCOUNTER — Other Ambulatory Visit: Payer: Self-pay

## 2019-07-10 ENCOUNTER — Ambulatory Visit: Payer: 59 | Admitting: Physician Assistant

## 2019-07-10 ENCOUNTER — Ambulatory Visit: Payer: 59 | Admitting: Adult Health Nurse Practitioner

## 2019-07-10 VITALS — BP 130/76 | HR 100 | Temp 97.5°F | Wt 260.0 lb

## 2019-07-10 DIAGNOSIS — E559 Vitamin D deficiency, unspecified: Secondary | ICD-10-CM

## 2019-07-10 DIAGNOSIS — I4892 Unspecified atrial flutter: Secondary | ICD-10-CM

## 2019-07-10 DIAGNOSIS — I159 Secondary hypertension, unspecified: Secondary | ICD-10-CM

## 2019-07-10 DIAGNOSIS — G4733 Obstructive sleep apnea (adult) (pediatric): Secondary | ICD-10-CM | POA: Diagnosis not present

## 2019-07-10 DIAGNOSIS — M79605 Pain in left leg: Secondary | ICD-10-CM

## 2019-07-10 DIAGNOSIS — E1165 Type 2 diabetes mellitus with hyperglycemia: Secondary | ICD-10-CM

## 2019-07-10 DIAGNOSIS — I4819 Other persistent atrial fibrillation: Secondary | ICD-10-CM

## 2019-07-10 DIAGNOSIS — M7989 Other specified soft tissue disorders: Secondary | ICD-10-CM

## 2019-07-10 DIAGNOSIS — I5033 Acute on chronic diastolic (congestive) heart failure: Secondary | ICD-10-CM

## 2019-07-10 DIAGNOSIS — F331 Major depressive disorder, recurrent, moderate: Secondary | ICD-10-CM | POA: Diagnosis not present

## 2019-07-10 DIAGNOSIS — J449 Chronic obstructive pulmonary disease, unspecified: Secondary | ICD-10-CM

## 2019-07-10 DIAGNOSIS — E785 Hyperlipidemia, unspecified: Secondary | ICD-10-CM

## 2019-07-10 DIAGNOSIS — E038 Other specified hypothyroidism: Secondary | ICD-10-CM

## 2019-07-10 DIAGNOSIS — Z79899 Other long term (current) drug therapy: Secondary | ICD-10-CM

## 2019-07-10 MED ORDER — DOXYCYCLINE HYCLATE 100 MG PO CAPS: ORAL_CAPSULE | ORAL | 0 refills | Status: DC

## 2019-07-10 MED ORDER — ALBUTEROL SULFATE HFA 108 (90 BASE) MCG/ACT IN AERS: 2.0000 | INHALATION_SPRAY | Freq: Four times a day (QID) | RESPIRATORY_TRACT | 1 refills | Status: DC | PRN

## 2019-07-10 NOTE — Patient Instructions (Addendum)
Your weight is up Please cut back on salt Please limit water to 1.5 L a day.   Please continue your lasix and all other medications but add on the metolazone every 3 days for 9 days or 3 doses at least.  Monitor your weight- if you lose more than 5 lbs in a day, keep off the metolazone.  Follow up in 1 week.  FOLLOW UP WITH YOUR CARDIOLOGIST  Do the following things EVERYDAY: 1) Weigh yourself in the morning before breakfast or at the same time every day. Write it down and keep it in a log. 2) Take your medicines as prescribed 3) Eat low salt foods--Limit salt (sodium) to 2000 mg per day. Best thing to do is avoid processed foods.   4) Stay as active as you can everyday 5) Limit all fluids for the day to less than 1.5 liters  Call your doctor if:  Anytime you have any of the following symptoms:  1) 2 pound weight gain in 24 hours or 5 pounds in 1 week  2) shortness of breath, with or without a dry hacking cough  3) swelling in the hands, LEGs, feet or stomach  4) if you have to sleep on extra pillows at night in order to breathe. 5) after laying down at night for 20-30 mins, you wake up short of breath.   These can all be signs of fluid overload.    Heart Failure Heart failure means your heart has trouble pumping blood. This makes it hard for your body to work well. Heart failure is usually a long-term (chronic) condition. You must take good care of yourself and follow your doctor's treatment plan. Follow these instructions at home:  Take your heart medicine as told by your doctor. ? Do not stop taking medicine unless your doctor tells you to. ? Do not skip any dose of medicine. ? Refill your medicines before they run out. ? Take other medicines only as told by your doctor or pharmacist.  Stay active if told by your doctor. The elderly and people with severe heart failure should talk with a doctor about physical activity.  Eat heart-healthy foods. Choose foods that are  without trans fat and are low in saturated fat, cholesterol, and salt (sodium). This includes fresh or frozen fruits and vegetables, fish, lean meats, fat-free or low-fat dairy foods, whole grains, and high-fiber foods. Lentils and dried peas and beans (legumes) are also good choices.  Limit salt if told by your doctor.  Cook in a healthy way. Roast, grill, broil, bake, poach, steam, or stir-fry foods.  Limit fluids as told by your doctor.  Weigh yourself every morning. Do this after you pee (urinate) and before you eat breakfast. Write down your weight to give to your doctor.  Take your blood pressure and write it down if your doctor tells you to.  Ask your doctor how to check your pulse. Check your pulse as told.  Lose weight if told by your doctor.  Stop smoking or chewing tobacco. Do not use gum or patches that help you quit without your doctor's approval.  Schedule and go to doctor visits as told.  Nonpregnant women should have no more than 1 drink a day. Men should have no more than 2 drinks a day. Talk to your doctor about drinking alcohol.  Stop illegal drug use.  Stay current with shots (immunizations).  Manage your health conditions as told by your doctor.  Learn to manage your stress.  Rest when you are tired.  If it is really hot outside: ? Avoid intense activities. ? Use air conditioning or fans, or get in a cooler place. ? Avoid caffeine and alcohol. ? Wear loose-fitting, lightweight, and light-colored clothing.  If it is really cold outside: ? Avoid intense activities. ? Layer your clothing. ? Wear mittens or gloves, a hat, and a scarf when going outside. ? Avoid alcohol.  Learn about heart failure and get support as needed.  Get help to maintain or improve your quality of life and your ability to care for yourself as needed. Contact a doctor if:  You gain weight quickly.  You are more short of breath than usual.  You cannot do your normal  activities.  You tire easily.  You cough more than normal, especially with activity.  You have any or more puffiness (swelling) in areas such as your hands, feet, ankles, or belly (abdomen).  You cannot sleep because it is hard to breathe.  You feel like your heart is beating fast (palpitations).  You get dizzy or light-headed when you stand up. Get help right away if:  You have trouble breathing.  There is a change in mental status, such as becoming less alert or not being able to focus.  You have chest pain or discomfort.  You faint. This information is not intended to replace advice given to you by your health care provider. Make sure you discuss any questions you have with your health care provider. Document Released: 11/04/2007 Document Revised: 07/03/2015 Document Reviewed: 03/13/2012 Elsevier Interactive Patient Education  2017 [employer].

## 2019-07-11 LAB — COMPLETE METABOLIC PANEL WITH GFR
AG Ratio: 1.6 (calc) (ref 1.0–2.5)
ALT: 26 U/L (ref 6–29)
AST: 15 U/L (ref 10–35)
Albumin: 4.1 g/dL (ref 3.6–5.1)
Alkaline phosphatase (APISO): 96 U/L (ref 37–153)
BUN/Creatinine Ratio: 17 (calc) (ref 6–22)
BUN: 25 mg/dL (ref 7–25)
CO2: 29 mmol/L (ref 20–32)
Calcium: 9.4 mg/dL (ref 8.6–10.4)
Chloride: 107 mmol/L (ref 98–110)
Creat: 1.5 mg/dL — ABNORMAL HIGH (ref 0.50–0.99)
GFR, Est African American: 43 mL/min/{1.73_m2} — ABNORMAL LOW (ref >=60)
GFR, Est Non African American: 37 mL/min/{1.73_m2} — ABNORMAL LOW (ref >=60)
Globulin: 2.6 g/dL (calc) (ref 1.9–3.7)
Glucose, Bld: 118 mg/dL — ABNORMAL HIGH (ref 65–99)
Potassium: 4.8 mmol/L (ref 3.5–5.3)
Sodium: 144 mmol/L (ref 135–146)
Total Bilirubin: 0.6 mg/dL (ref 0.2–1.2)
Total Protein: 6.7 g/dL (ref 6.1–8.1)

## 2019-07-11 LAB — LIPID PANEL
Cholesterol: 192 mg/dL (ref <200)
HDL: 42 mg/dL — ABNORMAL LOW (ref >=50)
LDL Cholesterol (Calc): 130 mg/dL (calc) — ABNORMAL HIGH
Non-HDL Cholesterol (Calc): 150 mg/dL (calc) — ABNORMAL HIGH (ref <130)
Total CHOL/HDL Ratio: 4.6 (calc) (ref <5.0)
Triglycerides: 102 mg/dL (ref <150)

## 2019-07-11 LAB — CBC WITH DIFFERENTIAL/PLATELET
Absolute Monocytes: 440 cells/uL (ref 200–950)
Basophils Absolute: 21 cells/uL (ref 0–200)
Basophils Relative: 0.3 %
Eosinophils Absolute: 99 cells/uL (ref 15–500)
Eosinophils Relative: 1.4 %
HCT: 37.5 % (ref 35.0–45.0)
Hemoglobin: 12.2 g/dL (ref 11.7–15.5)
Lymphs Abs: 1264 cells/uL (ref 850–3900)
MCH: 28.8 pg (ref 27.0–33.0)
MCHC: 32.5 g/dL (ref 32.0–36.0)
MCV: 88.4 fL (ref 80.0–100.0)
MPV: 11.3 fL (ref 7.5–12.5)
Monocytes Relative: 6.2 %
Neutro Abs: 5275 cells/uL (ref 1500–7800)
Neutrophils Relative %: 74.3 %
Platelets: 266 10*3/uL (ref 140–400)
RBC: 4.24 10*6/uL (ref 3.80–5.10)
RDW: 13.4 % (ref 11.0–15.0)
Total Lymphocyte: 17.8 %
WBC: 7.1 10*3/uL (ref 3.8–10.8)

## 2019-07-11 LAB — HEMOGLOBIN A1C
Hgb A1c MFr Bld: 6.2 % of total Hgb — ABNORMAL HIGH (ref <5.7)
Mean Plasma Glucose: 131 (calc)
eAG (mmol/L): 7.3 (calc)

## 2019-07-11 LAB — TSH: TSH: 4.11 mIU/L (ref 0.40–4.50)

## 2019-07-11 LAB — BRAIN NATRIURETIC PEPTIDE: Brain Natriuretic Peptide: 239 pg/mL — ABNORMAL HIGH (ref <100)

## 2019-07-11 LAB — VITAMIN D 25 HYDROXY (VIT D DEFICIENCY, FRACTURES): Vit D, 25-Hydroxy: 40 ng/mL (ref 30–100)

## 2019-07-11 LAB — MAGNESIUM: Magnesium: 2.3 mg/dL (ref 1.5–2.5)

## 2019-07-11 MED ORDER — ROSUVASTATIN CALCIUM 10 MG PO TABS: ORAL_TABLET | ORAL | 3 refills | Status: DC

## 2019-07-11 NOTE — Addendum Note (Signed)
Addended by: Vicie Mutters R on: 07/11/2019 08:30 AM   Modules accepted: Orders

## 2019-07-16 NOTE — Progress Notes (Signed)
Assessment and Plan:  Left leg pain/swelling after a fall Continue xarelto Finish ABX  Elevate leg  Chronic diastolic heart failure (HCC) Weight is down 3lbs, not at goal but swelling is better. Likely combination of increased eating and some swelling. Continue on zaroxyln pending kidney function.  Follow up with CHF clinic- number given.  Monitor weight daily- take zaroxylin every 3 days Follow up pending on kidney function.   Uncontrolled type 2 diabetes mellitus with hyperglycemia ([place]) Discussed general issues about diabetes pathophysiology and management., Educational material distributed., Suggested low cholesterol diet., Encouraged aerobic exercise., Discussed foot care., Reminded to get yearly retinal exam.  Chronic atrial fibrillation ([place]) Rate controlled, continue medications, xarelto  follow up with cardiology   Chronic obstructive pulmonary disease, unspecified COPD type ([place]) TRELOGY, do nebulizer AS NEEDED, continue CPAP  Morbid obesity (BMI 49.6) - long discussion about weight loss, diet, and exercise STOP SODA, STOP WALNUTS, DISCUSSED EATING OUT LESS She is restricted by not driving  OSA (obstructive sleep apnea) with COPD overlap syndrome On CPAP and doing better, weight loss discussed  Depression, unspecified depression type - continue medications, stress management techniques discussed, increase water, good sleep hygiene discussed, increase exercise, and increase veggies.   Medication management -     Magnesium  Atrial flutter, unspecified type ([place]) Continue xarelto and CPAP  The patient was advised to call immediately if she has any concerning symptoms in the interval. The patient voices understanding of current treatment options and is in agreement with the current care plan.The patient knows to call the clinic with any problems, questions or concerns or go to the ER if any further progression of symptoms.   Patient walked out without getting labs, will  contact and bring back for lab only.  Continue diet and meds as discussed. Discussed med's effects and SE's.  Future Appointments  Date Time Provider [place]  11/28/2019  2:00 PM Ward Givens, NP [facility] None  12/03/2019 10:00 AM Vicie Mutters, PA-C [facility] None    HPI 59 y.o. female  presents for 1 week follow up for possible cellulitis on her left leg from a fall and from elevated weights, started back on zaroxyln.   Her weight is down 3 lbs, she is on lasix 2 tablets in the morning and occ 1 in the afternoon and she has been taking one metolazone every other day since being seen on 07/10/2019. She follows with Dr. Haroldine Laws but has not followed up, suggested to follow up.  Her swelling is down but her weight is still not at goal. No muscle cramps.  She was also put on doxy and states her legs are doing better, still with some redness and tender but denies warmth, fever, chills. She is on xarelto.   BMI is Body mass index is 51.91 kg/m., she is working on diet and exercise. Wt Readings from Last 3 Encounters:  07/18/19 257 lb (116.6 kg)  07/10/19 260 lb (117.9 kg)  04/05/19 248 lb 9.6 oz (112.8 kg)   She had AKI with CKD but kidney function had improved last OV, still advised to see nephrology.  Lab Results  Component Value Date   FBPZWCHE 52 (L) 07/10/2019   She also has PHTN due to COPD and OSA, she is on CPAP.  Marland Kitchen   Current Medications:     Current Outpatient Medications (Cardiovascular):  .  bisoprolol (ZEBETA) 5 MG tablet, Take 1 tablet Daily for BP .  diltiazem (CARDIZEM CD) 360 MG 24 hr capsule, TAKE  1 CAPSULE BY MOUTH EVERY DAY. NEED OFFICE VISIT .  furosemide (LASIX) 40 MG tablet, TAKE 2 TO 3 TABLETS DAILY AS DIRECTED FOR BP & FLUID SWELLING .  metolazone (ZAROXOLYN) 2.5 MG tablet, As needed for increase in swelling (Patient taking differently: Take 2.5 mg by mouth once a week. As needed for increase in swelling) .  rosuvastatin (CRESTOR) 10 MG  tablet, Take 1 tablet Daily for Cholesterol .  spironolactone (ALDACTONE) 25 MG tablet, Take 0.5 tablets (12.5 mg total) by mouth daily. Needs appt for further refills   Current Outpatient Medications (Respiratory):  .  albuterol (PROAIR HFA) 108 (90 Base) MCG/ACT inhaler, Inhale 2 puffs into the lungs every 6 (six) hours as needed for wheezing or shortness of breath. Marland Kitchen  albuterol (PROVENTIL) (2.5 MG/3ML) 0.083% nebulizer solution, Take 3 mLs (2.5 mg total) by nebulization every 6 (six) hours as needed for wheezing or shortness of breath. Marland Kitchen  ipratropium-albuterol (DUONEB) 0.5-2.5 (3) MG/3ML SOLN, TAKE 3 MLS BY NEBULIZATION EVERY 4 (FOUR) HOURS AS NEEDED. MAX:6 DOSES PER DAY .  TRELEGY ELLIPTA 100-62.5-25 MCG/INH AEPB, INHALE 1 PUFF INTO THE LUNGS ONCE A DAY  Current Facility-Administered Medications (Respiratory):  .  ipratropium-albuterol (DUONEB) 0.5-2.5 (3) MG/3ML nebulizer solution 3 mL .  ipratropium-albuterol (DUONEB) 0.5-2.5 (3) MG/3ML nebulizer solution 3 mL  Current Outpatient Medications (Analgesics):  .  aspirin-acetaminophen-caffeine (EXCEDRIN MIGRAINE) 250-250-65 MG per tablet, Take 1 tablet by mouth every 6 (six) hours as needed for headache. .  naproxen sodium (ANAPROX) 220 MG tablet, Take 220 mg by mouth 2 (two) times daily as needed (back pain).   Current Outpatient Medications (Hematological):  Marland Kitchen  XARELTO 20 MG TABS tablet, TAKE 1 TABLET DAILY TO PREVENT BLOOD CLOTS   Current Outpatient Medications (Other):  Marland Kitchen  ALPRAZolam (XANAX) 0.25 MG tablet, 1-2 tablets as needed BID for panic attack- Suggest follow up in next 2 weeks if need more medication. .  chlorhexidine (PERIDEX) 0.12 % solution, Use as directed 15 mLs in the mouth or throat 2 (two) times daily. .  citalopram (CELEXA) 40 MG tablet, Take 1 tablet Daily for Mood .  diphenhydrAMINE HCl, Sleep, (SLEEP AID) 50 MG CAPS, Take 1 capsule by mouth daily. Marland Kitchen  doxycycline (VIBRAMYCIN) 100 MG capsule, Take 1 capsule twice  daily with food .  gabapentin (NEURONTIN) 300 MG capsule, Take 1 capsule 3 x /day for Chronic Back Pain .  nystatin cream (MYCOSTATIN), Apply 1 application topically 2 (two) times daily. (Patient taking differently: Apply 1 application topically 2 (two) times daily as needed for dry skin (ON FEET). ) .  polyethylene glycol (MIRALAX / GLYCOLAX) packet, Take 17 g by mouth daily as needed for moderate constipation.  .  potassium chloride (K-DUR) 10 MEQ tablet, Take 2 tablets 2 x /day for Potassium & take additional 2 tablets with additional dose of Lasix .  terbinafine (LAMISIL) 250 MG tablet, TAKE 1 TABLET BY MOUTH DAILY FOR TOENAIL FUNGUS .  tiZANidine (ZANAFLEX) 4 MG tablet, Take 1 tablet (4 mg total) by mouth every 6 (six) hours as needed for muscle spasms. .  traZODone (DESYREL) 100 MG tablet, Take 1 tablet at Bedtime for Sleep .  triamcinolone cream (KENALOG) 0.5 %, Apply 1 application topically 2 (two) times daily. (Patient taking differently: Apply 1 application topically 2 (two) times daily as needed (IRRITATION). ) .  valACYclovir (VALTREX) 1000 MG tablet, Take 1 tablet (1,000 mg total) by mouth 3 (three) times daily.   Medical History:  Past Medical History:  Diagnosis Date  . Anemia   . Anxiety   . Atrial flutter ([place])   . Borderline diabetes   . Chronic diastolic heart failure ([place])   . COPD (chronic obstructive pulmonary disease) ([place])   . Depression   . GERD (gastroesophageal reflux disease)   . HTN (hypertension)   . Hyperlipidemia   . Obstructive sleep apnea   . Persistent atrial fibrillation ([place])    Myoview 4/09: No ischemia;  echo 4/09: EF 60-65%;   Flecainide, beta blocker, Coumadin;    Unable to take Tikosyn 2/2 prolonged QT  . Pulmonary embolism (HCC)    Right lower lobe diagnosed by CT 6/12  . Type II or unspecified type diabetes mellitus without mention of complication, not stated as uncontrolled    Allergies:  Allergies  Allergen Reactions  . Sulfonamide  Derivatives Hives and Itching  . Tikosyn [Dofetilide]     Prolonged qt    Review of Systems:  See HPI  Family history- Review and unchanged Social history- Review and unchanged Physical Exam: BP 134/72   Pulse 63   Temp (!) 97.3 F (36.3 C)   Wt 257 lb (116.6 kg)   SpO2 94%   BMI 51.91 kg/m  Wt Readings from Last 3 Encounters:  07/18/19 257 lb (116.6 kg)  07/10/19 260 lb (117.9 kg)  04/05/19 248 lb 9.6 oz (112.8 kg)   General Appearance: Well nourished, obese, in no apparent distress. Eyes: PERRLA, EOMs, conjunctiva no swelling or erythema Sinuses: No Frontal/maxillary tenderness ENT/Mouth: Ext aud canals clear, TMs without erythema, bulging. No erythema, swelling, or exudate on post pharynx.Tonsils not swollen or erythematous. Hearing normal. Crowded mouth Neck: Supple, thyroid normal.  Respiratory: Decreased breath sounds bilateral, without wheezing, rhonchi or rales Cardio: Irreg, irreg,  1+ bilateral edema Abdomen: Soft, + BS, obese,  Non tender, no guarding, rebound, hernias, masses. Lymphatics: Non tender without lymphadenopathy.  Musculoskeletal: Full ROM, 5/5 strength, antalgic gait Skin:   some thickening at toes, no ulcers. Warm, dry without rashes, lesions, ecchymosis.  Neuro: Cranial nerves intact. No cerebellar symptoms.  Psych: Awake and oriented X 3,  Insight and Judgment appropriate.    Vicie Mutters, PA-C 9:11 AM [facility]

## 2019-07-18 ENCOUNTER — Encounter: Payer: Self-pay | Admitting: Physician Assistant

## 2019-07-18 ENCOUNTER — Other Ambulatory Visit: Payer: Self-pay

## 2019-07-18 ENCOUNTER — Ambulatory Visit: Payer: No Typology Code available for payment source | Admitting: Physician Assistant

## 2019-07-18 VITALS — BP 134/72 | HR 63 | Temp 97.3°F | Wt 257.0 lb

## 2019-07-18 DIAGNOSIS — J449 Chronic obstructive pulmonary disease, unspecified: Secondary | ICD-10-CM

## 2019-07-18 DIAGNOSIS — F331 Major depressive disorder, recurrent, moderate: Secondary | ICD-10-CM

## 2019-07-18 DIAGNOSIS — I5032 Chronic diastolic (congestive) heart failure: Secondary | ICD-10-CM

## 2019-07-18 DIAGNOSIS — I4819 Other persistent atrial fibrillation: Secondary | ICD-10-CM

## 2019-07-18 DIAGNOSIS — Z79899 Other long term (current) drug therapy: Secondary | ICD-10-CM

## 2019-07-18 DIAGNOSIS — M7989 Other specified soft tissue disorders: Secondary | ICD-10-CM

## 2019-07-18 DIAGNOSIS — I5033 Acute on chronic diastolic (congestive) heart failure: Secondary | ICD-10-CM

## 2019-07-18 DIAGNOSIS — G4733 Obstructive sleep apnea (adult) (pediatric): Secondary | ICD-10-CM

## 2019-07-18 NOTE — Patient Instructions (Addendum)
Follow up with Dr. Haroldine Laws Phone: [place] [facility]  Continue the lasix 2-3 pills a day Continue the metalzaone every other day for another week or at least 4 doses.    Do the following things EVERYDAY: 1) Weigh yourself in the morning before breakfast or at the same time every day. Write it down and keep it in a log. 2) Take your medicines as prescribed 3) Eat low salt foods-Limit salt (sodium) to 2000 mg per day. Best thing to do is avoid processed foods.   4) Stay as active as you can everyday 5) Limit all fluids for the day to less than 1.5 liters  Call your doctor if:  Anytime you have any of the following symptoms:  1) 2 pound weight gain in 24 hours or 5 pounds in 1 week  2) shortness of breath, with or without a dry hacking cough  3) swelling in the hands, LEGs, feet or stomach  4) if you have to sleep on extra pillows at night in order to breathe. 5) after laying down at night for 20-30 mins, you wake up short of breath.   These can all be signs of fluid overload.   General eating tips  What to Avoid . Avoid added sugars o Often added sugar can be found in processed foods such as many condiments, dry cereals, cakes, cookies, chips, crisps, crackers, candies, sweetened drinks, etc.  o Read labels and AVOID/DECREASE use of foods with the following in their ingredient list: Sugar, fructose, high fructose corn syrup, sucrose, glucose, maltose, dextrose, molasses, cane sugar, brown sugar, any type of syrup, agave nectar, etc.   . Avoid snacking in between meals- drink water or if you feel you need a snack, pick a high water content snack such as cucumbers, watermelon, or any veggie.  Marland Kitchen Avoid foods made with flour o If you are going to eat food made with flour, choose those made with whole-grains; and, minimize your consumption as much as is tolerable . Avoid processed foods o These foods are generally stocked in the middle of the grocery store.   o Focus on shopping on the perimeter of the grocery.  What to Include . Vegetables o GREEN LEAFY VEGETABLES: Kale, spinach, mustard greens, collard greens, cabbage, broccoli, etc. o OTHER: Asparagus, cauliflower, eggplant, carrots, peas, Brussel sprouts, tomatoes, bell peppers, zucchini, beets, cucumbers, etc. . Grains, seeds, and legumes o Beans: kidney beans, black eyed peas, garbanzo beans, black beans, pinto beans, etc. o Whole, unrefined grains: brown rice, barley, bulgur, oatmeal, etc. . Healthy fats  o Avoid highly processed fats such as vegetable oil o Examples of healthy fats: avocado, olives, virgin olive oil, dark chocolate (?72% Cocoa), nuts (peanuts, almonds, walnuts, cashews, pecans, etc.) o Please still do small amount of these healthy fats, they are dense in calories.  . Low - Moderate Intake of Animal Sources of Protein o Meat sources: chicken, [place], salmon, tuna. Limit to 4 ounces of meat at one time or the size of your palm. o Consider limiting dairy sources, but when choosing dairy focus on: PLAIN [place] yogurt, cottage cheese, high-protein milk . Fruit o Choose berries     Heart Failure Heart failure means your heart has trouble pumping blood. This makes it hard for your body to work well. Heart failure is usually a long-term (chronic) condition. You must take good care of yourself and follow your doctor's treatment plan. Follow these instructions at home:  Take your heart medicine as  told by your doctor. ? Do not stop taking medicine unless your doctor tells you to. ? Do not skip any dose of medicine. ? Refill your medicines before they run out. ? Take other medicines only as told by your doctor or pharmacist.  Stay active if told by your doctor. The elderly and people with severe heart failure should talk with a doctor about physical activity.  Eat heart-healthy foods. Choose foods that are without trans fat and are low in saturated fat, cholesterol, and salt  (sodium). This includes fresh or frozen fruits and vegetables, fish, lean meats, fat-free or low-fat dairy foods, whole grains, and high-fiber foods. Lentils and dried peas and beans (legumes) are also good choices.  Limit salt if told by your doctor.  Cook in a healthy way. Roast, grill, broil, bake, poach, steam, or stir-fry foods.  Limit fluids as told by your doctor.  Weigh yourself every morning. Do this after you pee (urinate) and before you eat breakfast. Write down your weight to give to your doctor.  Take your blood pressure and write it down if your doctor tells you to.  Ask your doctor how to check your pulse. Check your pulse as told.  Lose weight if told by your doctor.  Stop smoking or chewing tobacco. Do not use gum or patches that help you quit without your doctor's approval.  Schedule and go to doctor visits as told.  Nonpregnant women should have no more than 1 drink a day. Men should have no more than 2 drinks a day. Talk to your doctor about drinking alcohol.  Stop illegal drug use.  Stay current with shots (immunizations).  Manage your health conditions as told by your doctor.  Learn to manage your stress.  Rest when you are tired.  If it is really hot outside: ? Avoid intense activities. ? Use air conditioning or fans, or get in a cooler place. ? Avoid caffeine and alcohol. ? Wear loose-fitting, lightweight, and light-colored clothing.  If it is really cold outside: ? Avoid intense activities. ? Layer your clothing. ? Wear mittens or gloves, a hat, and a scarf when going outside. ? Avoid alcohol.  Learn about heart failure and get support as needed.  Get help to maintain or improve your quality of life and your ability to care for yourself as needed. Contact a doctor if:  You gain weight quickly.  You are more short of breath than usual.  You cannot do your normal activities.  You tire easily.  You cough more than normal, especially with  activity.  You have any or more puffiness (swelling) in areas such as your hands, feet, ankles, or belly (abdomen).  You cannot sleep because it is hard to breathe.  You feel like your heart is beating fast (palpitations).  You get dizzy or light-headed when you stand up. Get help right away if:  You have trouble breathing.  There is a change in mental status, such as becoming less alert or not being able to focus.  You have chest pain or discomfort.  You faint. This information is not intended to replace advice given to you by your health care provider. Make sure you discuss any questions you have with your health care provider. Document Released: 11/04/2007 Document Revised: 07/03/2015 Document Reviewed: 03/13/2012 Elsevier Interactive Patient Education  2017 [employer].

## 2019-07-19 LAB — MAGNESIUM: Magnesium: 2.1 mg/dL (ref 1.5–2.5)

## 2019-07-19 LAB — CBC WITH DIFFERENTIAL/PLATELET
Absolute Monocytes: 422 cells/uL (ref 200–950)
Basophils Absolute: 41 cells/uL (ref 0–200)
Basophils Relative: 0.6 %
Eosinophils Absolute: 88 cells/uL (ref 15–500)
Eosinophils Relative: 1.3 %
HCT: 40.4 % (ref 35.0–45.0)
Hemoglobin: 13.1 g/dL (ref 11.7–15.5)
Lymphs Abs: 1530 cells/uL (ref 850–3900)
MCH: 28.5 pg (ref 27.0–33.0)
MCHC: 32.4 g/dL (ref 32.0–36.0)
MCV: 87.8 fL (ref 80.0–100.0)
MPV: 11.3 fL (ref 7.5–12.5)
Monocytes Relative: 6.2 %
Neutro Abs: 4719 cells/uL (ref 1500–7800)
Neutrophils Relative %: 69.4 %
Platelets: 269 10*3/uL (ref 140–400)
RBC: 4.6 10*6/uL (ref 3.80–5.10)
RDW: 13.3 % (ref 11.0–15.0)
Total Lymphocyte: 22.5 %
WBC: 6.8 10*3/uL (ref 3.8–10.8)

## 2019-07-19 LAB — COMPLETE METABOLIC PANEL WITH GFR
AG Ratio: 1.6 (calc) (ref 1.0–2.5)
ALT: 15 U/L (ref 6–29)
AST: 12 U/L (ref 10–35)
Albumin: 4.1 g/dL (ref 3.6–5.1)
Alkaline phosphatase (APISO): 97 U/L (ref 37–153)
BUN/Creatinine Ratio: 19 (calc) (ref 6–22)
BUN: 29 mg/dL — ABNORMAL HIGH (ref 7–25)
CO2: 28 mmol/L (ref 20–32)
Calcium: 9.7 mg/dL (ref 8.6–10.4)
Chloride: 105 mmol/L (ref 98–110)
Creat: 1.5 mg/dL — ABNORMAL HIGH (ref 0.50–0.99)
GFR, Est African American: 43 mL/min/{1.73_m2} — ABNORMAL LOW (ref >=60)
GFR, Est Non African American: 37 mL/min/{1.73_m2} — ABNORMAL LOW (ref >=60)
Globulin: 2.5 g/dL (calc) (ref 1.9–3.7)
Glucose, Bld: 117 mg/dL — ABNORMAL HIGH (ref 65–99)
Potassium: 3.9 mmol/L (ref 3.5–5.3)
Sodium: 143 mmol/L (ref 135–146)
Total Bilirubin: 0.6 mg/dL (ref 0.2–1.2)
Total Protein: 6.6 g/dL (ref 6.1–8.1)

## 2019-08-14 ENCOUNTER — Ambulatory Visit: Payer: 59

## 2019-08-14 ENCOUNTER — Other Ambulatory Visit: Payer: Self-pay | Admitting: Internal Medicine

## 2019-09-11 ENCOUNTER — Other Ambulatory Visit (HOSPITAL_COMMUNITY): Payer: Self-pay | Admitting: Internal Medicine

## 2019-09-23 ENCOUNTER — Other Ambulatory Visit: Payer: Self-pay | Admitting: Internal Medicine

## 2019-09-24 ENCOUNTER — Other Ambulatory Visit: Payer: Self-pay | Admitting: Internal Medicine

## 2019-09-24 ENCOUNTER — Other Ambulatory Visit (HOSPITAL_COMMUNITY): Payer: Self-pay | Admitting: Internal Medicine

## 2019-09-24 DIAGNOSIS — I5032 Chronic diastolic (congestive) heart failure: Secondary | ICD-10-CM

## 2019-09-30 ENCOUNTER — Other Ambulatory Visit: Payer: Self-pay | Admitting: Internal Medicine

## 2019-10-08 ENCOUNTER — Other Ambulatory Visit: Payer: Self-pay | Admitting: Internal Medicine

## 2019-10-08 DIAGNOSIS — I5032 Chronic diastolic (congestive) heart failure: Secondary | ICD-10-CM

## 2019-11-28 ENCOUNTER — Ambulatory Visit: Payer: BLUE CROSS/BLUE SHIELD | Admitting: Adult Health

## 2019-12-02 NOTE — Progress Notes (Signed)
Complete Physical  Assessment and Plan:  Encounter for general adult medical examination with abnormal findings Due annually  Referred to GYN for overdue mammogram and PAP Work on weight loss prior to colonoscopy per patient report - will prioritize this   Chronic atrial fibrillation ([place]) Continue medications, rate controlled, on NOAC, follow up cardio  Secondary hypertension - cardiology following, continue medications, DASH diet, exercise and monitor at home. Call if greater than 130/80.   Other pulmonary embolism without acute cor pulmonale, unspecified chronicity ([place]) Stay on CPAP, continue xarelto  Atrial flutter, unspecified type ([place]) Continue medications, rate controlled, on NOAC, follow up cardio  Pulmonary hypertension due to COPD ([place]) Stay on CPAP, weight loss advised  CHF NYHA class III (symptoms with mildly strenuous activities), chronic, diastolic (HCC) Follow up with cardio, monitor weight daily, weight loss advised  OSA and COPD overlap syndrome ([place]) Stay on CPAP, weight loss advised  Chronic obstructive pulmonary disease, unspecified COPD type ([place]) Continue inhalers  Type 2 diabetes mellitus with CKD ([place]) Discussed general issues about diabetes pathophysiology and management., Educational material distributed., Suggested low cholesterol diet., Encouraged aerobic exercise., Discussed foot care., Reminded to get yearly retinal exam. -     COMPLETE METABOLIC PANEL WITH GFR -     Hemoglobin A1c  Hyperlipidemia assocaited with T2DM (HCC) check lipids decrease fatty foods increase activity.  -     Lipid panel  CKD (chronic kidney disease) stage 3b, GFR 44-30 ml/min (HCC) avoid NSAIDS, control glucose/BP, will monitor -     COMPLETE METABOLIC PANEL WITH GFR -     Urinalysis, Routine w reflex microscopic -     Microalbumin / creatinine urine ratio  Other specified hypothyroidism -check TSH level, continue medications the same, reminded to take on an  empty stomach 30-8mns before food.  -     TSH  Depression, major, recurrent, moderate (HCC)/anxiety  - continue medications, stress management techniques discussed, increase water, good sleep hygiene discussed, increase exercise, and increase veggies.   Morbid obesity ([place] - follow up 3 months for progress monitoring; she will work on increasing fruit/veggies intake - increase veggies, try to limit snacking - long discussion about weight loss, diet, and exercise - will try to improve bowel habits, then try ozempic, poor candidate for phentermine due to heart, metformin due to renal functions - given information about emotional eating  Vitamin D deficiency -     VITAMIN D 25 Hydroxy (Vit-D Deficiency, Fractures)  Gastroesophageal reflux disease, esophagitis presence not specified Continue PPI/H2 blocker, diet discussed  Noncompliance Hx of; improved; will discuss barriers regularly and individualize plan to assist with compliance  Medication management -     Magnesium  Family history of colon cancer - OVERDUE, has constipation as well, get on miralax and needs colonoscopy - She discussed with Dr. MCollene Mares concern due to cardiac history and reports was recommended weight loss prior to colonoscopy. WEIGHT LOSS HIGH PRIORITY THIS YEAR  Constipation Add miralax, increase movement during day, increase fiber intake, and maximize fluid intake as recommended by cardiology  Orders Placed This Encounter  Procedures  . FLU VACCINE MDCK QUAD W/Preservative  . CBC with Differential/Platelet  . COMPLETE METABOLIC PANEL WITH GFR  . Magnesium  . Lipid panel  . TSH  . Hemoglobin A1c  . VITAMIN D 25 Hydroxy (Vit-D Deficiency, Fractures)  . Microalbumin / creatinine urine ratio  . Urinalysis, Routine w reflex microscopic  . Ambulatory referral to Gynecology  . EKG 12-Lead  . HM DIABETES  FOOT EXAM    Discussed med's effects and SE's. Screening labs and tests as requested with regular  follow-up as recommended. Over 40 minutes of exam, counseling, chart review, and complex, high level critical decision making was performed this visit.  Future Appointments  Date Time Provider [place]  12/04/2019  9:00 AM Ward Givens, NP [facility] None  03/10/2020  8:45 AM Liane Comber, NP [facility] None  06/09/2020  8:45 AM Liane Comber, NP [facility] None  12/03/2020 10:00 AM Liane Comber, NP [facility] None    HPI  59 y.o. female  presents for a complete physical and follow up for has Depression, major, recurrent, moderate ([place]); OSA and COPD overlap syndrome ([place]); G E R D; CHF (congestive heart failure), NYHA class III, chronic, diastolic ([place]); History of pulmonary embolus (PE); Atrial flutter (HCC); COPD (chronic obstructive pulmonary disease) ([place]); Atrial fibrillation ([place]); Hypothyroid; Hyperlipidemia associated with type 2 diabetes mellitus ([place]); HTN (hypertension); T2DM (type 2 diabetes mellitus) ([place]); Anxiety; Vitamin D deficiency; Morbid obesity ([place]); Noncompliance; CKD stage G3b/A1, GFR 30-44 and albumin creatinine ratio <30 mg/g ([place]); Pulmonary hypertension due to COPD ([place]); Obstructive sleep apnea treated with continuous positive airway pressure (CPAP); and Constipation on their problem list.  She is separated since 2009, 4 grown children and 5 grandchildren. 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 Has noted fewer URI since working from home and happy.   She doesn't drive, family drives as needed.   Most stress is from family issues, son thinking about the [employer].  She has hx of recurrent depression, on celexa 40 mg, trazodone 50 mg, rare PRN xanax. Also taking gabapentin at night which helps.   She takes naproxen PRN rarely for aches and pains.   BMI is Body mass index is 53.42 kg/m., she is working on diet and exercise. She reports just started working with dietician from work, doing more yogurt/fruit, eggs. Doesn't track weight.   Wt Readings  from Last 3 Encounters:  12/03/19 260 lb (117.9 kg)  07/18/19 257 lb (116.6 kg)  07/10/19 260 lb (117.9 kg)   She has a history of afib/flutter and CHF, she is on amiodarone, cardizem, she is on xarelto, spriolactone 1/2 at night, following with Dr. Haroldine Laws.   She is on lasix 2 a day and zaroxyln 1 x a week and 6-8 potassium a day.   She also has PHTN due to COPD and OSA, she is on CPAP, follows with neurology, endorses 100% compliance. Has follow up tomorrow with Dr. Brett Fairy.   She is on trellegy ellipta for COPD, nebs PRN and reports doing well.   Her blood pressure has been controlled at home, today their BP is BP: 112/78  She does not workout. She denies chest pain, shortness of breath, dizziness.  She is not on cholesterol medication and denies myalgias. Her cholesterol is not at goal. The cholesterol last visit was:   Lab Results  Component Value Date   CHOL 192 07/10/2019   HDL 42 (L) 07/10/2019   LDLCALC 130 (H) 07/10/2019   TRIG 102 07/10/2019   CHOLHDL 4.6 07/10/2019   She has been working on diet and exercise for lifestyle managed T2 diabetes that has fortunately remained <7%, she is on bASA, she is on ACE/ARB and denies paresthesia of the feet, polydipsia, polyuria and visual disturbances. Last A1C in the office was:  Lab Results  Component Value Date   HGBA1C 6.2 (H) 07/10/2019    She has CKD IIIb r/t htn and T2DM monitored  here Lab Results  Component Value Date   GFRNONAA 37 (L) 07/18/2019   GFRNONAA 37 (L) 07/10/2019   GFRNONAA 38 (L) 03/12/2019   She is on thyroid medication. Her medication was not changed last visit.   Lab Results  Component Value Date   TSH 4.11 07/10/2019   Patient is on Vitamin D supplement and now calcium pills, states she has less back pain.   Lab Results  Component Value Date   VD25OH 40 07/10/2019        Current Medications:  Current Outpatient Medications on File Prior to Visit  Medication Sig Dispense Refill  .  albuterol (PROAIR HFA) 108 (90 Base) MCG/ACT inhaler Inhale 2 puffs into the lungs every 6 (six) hours as needed for wheezing or shortness of breath. 8 g 1  . albuterol (PROVENTIL) (2.5 MG/3ML) 0.083% nebulizer solution Take 3 mLs (2.5 mg total) by nebulization every 6 (six) hours as needed for wheezing or shortness of breath. 75 mL 12  . ALPRAZolam (XANAX) 0.25 MG tablet 1-2 tablets as needed BID for panic attack- Suggest follow up in next 2 weeks if need more medication. (Patient not taking: Reported on 12/03/2019) 60 tablet 0  . aspirin-acetaminophen-caffeine (EXCEDRIN MIGRAINE) 121-975-88 MG per tablet Take 1 tablet by mouth every 6 (six) hours as needed for headache.    . bisoprolol (ZEBETA) 5 MG tablet TAKE 1 TABLET DAILY FOR BLOOD PRESSURE 90 tablet 1  . chlorhexidine (PERIDEX) 0.12 % solution Use as directed 15 mLs in the mouth or throat 2 (two) times daily. 473 mL 0  . citalopram (CELEXA) 40 MG tablet Take 1 tablet Daily for Mood 90 tablet 3  . diltiazem (CARDIZEM CD) 360 MG 24 hr capsule Take 1 capsule (360 mg total) by mouth daily. Must be seen in office for further refills 90 capsule 0  . diphenhydrAMINE HCl, Sleep, (SLEEP AID) 50 MG CAPS Take 1 capsule by mouth daily.    . furosemide (LASIX) 40 MG tablet TAKE 1 TABLET 2 TO 3 X /DAY AS DIRECTED FOR BP & FLUID RETENTION / ANKLE SWELLING 270 tablet 3  . gabapentin (NEURONTIN) 300 MG capsule Take 1 capsule 3 x /day for Chronic Back Pain 270 capsule 1  . ipratropium-albuterol (DUONEB) 0.5-2.5 (3) MG/3ML SOLN TAKE 3 MLS BY NEBULIZATION EVERY 4 (FOUR) HOURS AS NEEDED. MAX:6 DOSES PER DAY 540 mL 0  . metolazone (ZAROXOLYN) 2.5 MG tablet As needed for increase in swelling (Patient taking differently: Take 2.5 mg by mouth once a week. As needed for increase in swelling) 90 tablet 3  . naproxen sodium (ANAPROX) 220 MG tablet Take 220 mg by mouth 2 (two) times daily as needed (back pain).    . nystatin cream (MYCOSTATIN) Apply 1 application topically  2 (two) times daily. (Patient taking differently: Apply 1 application topically 2 (two) times daily as needed for dry skin (ON FEET). ) 30 g 1  . polyethylene glycol (MIRALAX / GLYCOLAX) packet Take 17 g by mouth daily as needed for moderate constipation.     . potassium chloride (KLOR-CON) 10 MEQ tablet TAKE 2 TABLETS 2 X /DAY FOR POTASSIUM & TAKE ADDITIONAL 2 TABLETS WITH ADDITIONAL DOSE OF LASIX 360 tablet 3  . rivaroxaban (XARELTO) 20 MG TABS tablet Take 1 tablet Daily to Prevent Blood Clots 90 tablet 1  . spironolactone (ALDACTONE) 25 MG tablet TAKE 0.5 TABLETS (12.5 MG TOTAL) BY MOUTH DAILY. NEEDS APPT FOR FURTHER REFILLS 15 tablet 0  . terbinafine (LAMISIL) 250 MG  tablet TAKE 1 TABLET BY MOUTH DAILY FOR TOENAIL FUNGUS 30 tablet 2  . traZODone (DESYREL) 100 MG tablet Take 1 tablet at Bedtime for Sleep 90 tablet 3  . TRELEGY ELLIPTA 100-62.5-25 MCG/INH AEPB INHALE 1 PUFF INTO THE LUNGS ONCE A DAY 180 each 1  . triamcinolone cream (KENALOG) 0.5 % Apply 1 application topically 2 (two) times daily. (Patient not taking: Reported on 12/03/2019) 80 g 2  . valACYclovir (VALTREX) 1000 MG tablet Take 1 tablet (1,000 mg total) by mouth 3 (three) times daily. 21 tablet 0   Current Facility-Administered Medications on File Prior to Visit  Medication Dose Route Frequency Provider Last Rate Last Admin  . ipratropium-albuterol (DUONEB) 0.5-2.5 (3) MG/3ML nebulizer solution 3 mL  3 mL Nebulization Once Vicie Mutters R, PA-C      . ipratropium-albuterol (DUONEB) 0.5-2.5 (3) MG/3ML nebulizer solution 3 mL  3 mL Nebulization Once Garnet Sierras, NP       Allergies:  Allergies  Allergen Reactions  . Sulfonamide Derivatives Hives and Itching  . Tikosyn [Dofetilide]     Prolonged qt   Medical History:  She has Depression, major, recurrent, moderate ([place]); OSA and COPD overlap syndrome ([place]); G E R D; CHF (congestive heart failure), NYHA class III, chronic, diastolic ([place]); History of pulmonary embolus  (PE); Atrial flutter (HCC); COPD (chronic obstructive pulmonary disease) ([place]); Atrial fibrillation ([place]); Hypothyroid; Hyperlipidemia associated with type 2 diabetes mellitus ([place]); HTN (hypertension); T2DM (type 2 diabetes mellitus) ([place]); Anxiety; Vitamin D deficiency; Morbid obesity ([place]); Noncompliance; CKD stage G3b/A1, GFR 30-44 and albumin creatinine ratio <30 mg/g ([place]); Pulmonary hypertension due to COPD ([place]); Obstructive sleep apnea treated with continuous positive airway pressure (CPAP); and Constipation on their problem list.   Health Maintenance:   Immunization History  Administered Date(s) Administered  . Influenza Inj Mdck Quad With Preservative 11/30/2018, 12/03/2019  . Td 02/08/2001  . Tdap 11/07/2017    Tetanus: 2019 Pneumovax: DUE - defer, just had covid 19 Prevnar 13:  Flu vaccine: 2020, TODAY  Shingrix: will check with insurance  Covid 19: 1/2, 2021, pfizer  Pap: 2010 -has been declining "I'm embarrassed about my weight." after long discussion will refer to GYN for attempt there MGM: 2006 OVERDUE -poor follow through - will refer to GYN DEXA: age 79  Colonoscopy: 2003 colon cancer in her mom late 64's passed age 58 - states can not do the colonoscopy due to health, Dr. Collene Mares discussed with Dr. Missy Sabins, medium, advised work on weight loss first   Last eye: several years, encouraged overdue diabetic eye, she declines referral for this today Last dental:   Patient Care Team: Unk Pinto, MD as PCP - General (Internal Medicine) Stanford Breed Denice Bors, MD as Consulting Physician (Cardiology) Bensimhon, Shaune Pascal, MD as Consulting Physician (Cardiology)  Surgical History:  She has a past surgical history that includes Tubal ligation (1996); TEE without cardioversion (01/19/2012); Cardioversion (01/19/2012); Cardioversion (01/21/2012); and basal skin can. Family History:  Herfamily history includes Allergies in her father; Asthma in her father; Colon cancer in her  mother; Coronary artery disease in her father and mother; Depression in her daughter; Emphysema in her father; Heart attack in her maternal grandfather; Heart disease in her maternal grandmother. Social History:  She reports that she quit smoking about 10 years ago. Her smoking use included cigarettes. She has a 70.00 pack-year smoking history. She has never used smokeless tobacco. She reports that she does not drink alcohol and does not use drugs.  Review of Systems:  Review of Systems  Constitutional: Negative.  Negative for malaise/fatigue and weight loss.  HENT: Negative.  Negative for hearing loss and tinnitus.   Eyes: Negative.  Negative for blurred vision and double vision.  Respiratory: Negative.  Negative for cough, sputum production, shortness of breath and wheezing.   Cardiovascular: Negative.  Negative for chest pain, palpitations, orthopnea, claudication, leg swelling and PND.  Gastrointestinal: Negative.  Negative for abdominal pain, blood in stool, constipation, diarrhea, heartburn, melena, nausea and vomiting.  Genitourinary: Negative.   Musculoskeletal: Positive for joint pain (intermittent hips, back). Negative for falls and myalgias.  Skin: Negative.  Negative for rash.  Neurological: Negative for dizziness, tingling, sensory change, weakness and headaches.  Endo/Heme/Allergies: Negative for polydipsia.  Psychiatric/Behavioral: Positive for depression. Negative for memory loss, substance abuse and suicidal ideas. The patient is not nervous/anxious and does not have insomnia.   All other systems reviewed and are negative.   Physical Exam: Estimated body mass index is 53.42 kg/m as calculated from the following:   Height as of this encounter: 4' 10.5" (1.486 m).   Weight as of this encounter: 260 lb (117.9 kg).  BP 112/78   Pulse 72   Temp (!) 97.3 F (36.3 C)   Ht 4' 10.5" (1.486 m)   Wt 260 lb (117.9 kg)   SpO2 96%   BMI 53.42 kg/m  General Appearance: Well  dressed, moribdly obese, in no apparent distress. Eyes: PERRLA, EOMs, conjunctiva no swelling or erythema Sinuses: No Frontal/maxillary tenderness ENT/Mouth: Ext aud canals clear, TMs without erythema, bulging. No erythema, swelling, or exudate on post pharynx.Tonsils not swollen or erythematous. Hearing normal. Crowded mouth Neck: Supple, thyroid normal.  Respiratory: Decreased breath sounds bilateral, with wheezing and decreased breath sounds bilateral lower lobes Cardio: Irreg, irreg,  minimal edema Abdomen: Soft, + BS, Morbidly obese,  Non tender, no guarding, rebound, no palpable hernias, masses. Lymphatics: Non tender without lymphadenopathy.  Musculoskeletal: Body habitus limits exam, symmetrical ROM, 5/5 strength, antalgic gait with cane Skin: warm, dry intact, without notable rash, lesions, ecchymosis. Bilateral feet thickened long toenail no ulcers. Has very thick callouses bil Neuro: Cranial nerves intact. No cerebellar symptoms. Bil feet monofilament intact 10/10 but diminished throughout  Psych: Awake and oriented X 3,  Intermittently tearful/depressed affect, Insight and Judgment appropriate.  Breasts: defer to GYN, referral placed GU: body habitus limits - refer to GYN  EKG: A. Fib, no ST changes  Gorden Harms Espyn Radwan 1:07 PM [facility]

## 2019-12-03 ENCOUNTER — Other Ambulatory Visit: Payer: Self-pay

## 2019-12-03 ENCOUNTER — Ambulatory Visit: Payer: No Typology Code available for payment source | Admitting: Adult Health

## 2019-12-03 ENCOUNTER — Encounter: Payer: Self-pay | Admitting: Adult Health

## 2019-12-03 VITALS — BP 112/78 | HR 72 | Temp 97.3°F | Ht 58.5 in | Wt 260.0 lb

## 2019-12-03 DIAGNOSIS — Z136 Encounter for screening for cardiovascular disorders: Secondary | ICD-10-CM | POA: Diagnosis not present

## 2019-12-03 DIAGNOSIS — Z0000 Encounter for general adult medical examination without abnormal findings: Secondary | ICD-10-CM | POA: Diagnosis not present

## 2019-12-03 DIAGNOSIS — I5032 Chronic diastolic (congestive) heart failure: Secondary | ICD-10-CM

## 2019-12-03 DIAGNOSIS — I4819 Other persistent atrial fibrillation: Secondary | ICD-10-CM | POA: Diagnosis not present

## 2019-12-03 DIAGNOSIS — F419 Anxiety disorder, unspecified: Secondary | ICD-10-CM

## 2019-12-03 DIAGNOSIS — F331 Major depressive disorder, recurrent, moderate: Secondary | ICD-10-CM

## 2019-12-03 DIAGNOSIS — Z124 Encounter for screening for malignant neoplasm of cervix: Secondary | ICD-10-CM

## 2019-12-03 DIAGNOSIS — Z23 Encounter for immunization: Secondary | ICD-10-CM | POA: Diagnosis not present

## 2019-12-03 DIAGNOSIS — E1169 Type 2 diabetes mellitus with other specified complication: Secondary | ICD-10-CM

## 2019-12-03 DIAGNOSIS — E1122 Type 2 diabetes mellitus with diabetic chronic kidney disease: Secondary | ICD-10-CM

## 2019-12-03 DIAGNOSIS — I4892 Unspecified atrial flutter: Secondary | ICD-10-CM

## 2019-12-03 DIAGNOSIS — E559 Vitamin D deficiency, unspecified: Secondary | ICD-10-CM

## 2019-12-03 DIAGNOSIS — Z0001 Encounter for general adult medical examination with abnormal findings: Secondary | ICD-10-CM

## 2019-12-03 DIAGNOSIS — N1832 Chronic kidney disease, stage 3b: Secondary | ICD-10-CM

## 2019-12-03 DIAGNOSIS — Z86711 Personal history of pulmonary embolism: Secondary | ICD-10-CM

## 2019-12-03 DIAGNOSIS — I159 Secondary hypertension, unspecified: Secondary | ICD-10-CM

## 2019-12-03 DIAGNOSIS — Z9989 Dependence on other enabling machines and devices: Secondary | ICD-10-CM

## 2019-12-03 DIAGNOSIS — J449 Chronic obstructive pulmonary disease, unspecified: Secondary | ICD-10-CM

## 2019-12-03 DIAGNOSIS — I2723 Pulmonary hypertension due to lung diseases and hypoxia: Secondary | ICD-10-CM

## 2019-12-03 DIAGNOSIS — E038 Other specified hypothyroidism: Secondary | ICD-10-CM

## 2019-12-03 DIAGNOSIS — Z1239 Encounter for other screening for malignant neoplasm of breast: Secondary | ICD-10-CM

## 2019-12-03 DIAGNOSIS — K5900 Constipation, unspecified: Secondary | ICD-10-CM

## 2019-12-03 DIAGNOSIS — G4733 Obstructive sleep apnea (adult) (pediatric): Secondary | ICD-10-CM

## 2019-12-03 DIAGNOSIS — K219 Gastro-esophageal reflux disease without esophagitis: Secondary | ICD-10-CM

## 2019-12-03 MED ORDER — PRAVASTATIN SODIUM 40 MG PO TABS: 40.0000 mg | ORAL_TABLET | Freq: Every evening | ORAL | 3 refills | Status: DC

## 2019-12-03 NOTE — Patient Instructions (Addendum)
[name] , Thank you for taking time to come for your Annual Wellness Visit. I appreciate your ongoing commitment to your health goals. Please review the following plan we discussed and let me know if I can assist you in the future.   These are the goals we discussed: Goals   None     This is a list of the screening recommended for you and due dates:  Health Maintenance  Topic Date Due  .  Hepatitis C: One time screening is recommended by Center for Disease Control  (CDC) for  adults born from [birth year] through 1967.   Never done  . Pneumococcal vaccine  Never done  . Complete foot exam   Never done  . Eye exam for diabetics  Never done  . COVID-19 Vaccine (1) Never done  . HIV Screening  Never done  . Pap Smear  Never done  . Mammogram  Never done  . Colon Cancer Screening  Never done  . Flu Shot  09/09/2019  . Urine Protein Check  11/30/2019  . Hemoglobin A1C  01/09/2020  . Tetanus Vaccine  11/08/2027    Please check with insurance about shingrix vaccine - if they cover, recommend you get at local [facility] or [facility]    Please get diabetes eye exam - prior to the end of the year if possible - have them send [place] a report      Are you an emotional eater? Do you eat more when you're feeling stressed? Do you eat when you're not hungry or when you're full? Do you eat to feel better (to calm and soothe yourself when you're sad, mad, bored, anxious, etc.)? Do you reward yourself with food? Do you regularly eat until you've stuffed yourself? Does food make you feel safe? Do you feel like food is a friend? Do you feel powerless or out of control around food?  If you answered yes to some of these questions than it is likely that you are an emotional eater. This is normally a learned behavior and can take time to first recognize the signs and second BREAK THE HABIT. But here is more information and tips to help.   The difference between emotional hunger and physical hunger Emotional  hunger can be powerful, so it's easy to mistake it for physical hunger. But there are clues you can look for to help you tell physical and emotional hunger apart.  Emotional hunger comes on suddenly. It hits you in an instant and feels overwhelming and urgent. Physical hunger, on the other hand, comes on more gradually. The urge to eat doesn't feel as dire or demand instant satisfaction (unless you haven't eaten for a very long time).  Emotional hunger craves specific comfort foods. When you're physically hungry, almost anything sounds good--including healthy stuff like vegetables. But emotional hunger craves junk food or sugary snacks that provide an instant rush. You feel like you need cheesecake or pizza, and nothing else will do.  Emotional hunger often leads to mindless eating. Before you know it, you've eaten a whole bag of chips or an entire pint of ice cream without really paying attention or fully enjoying it. When you're eating in response to physical hunger, you're typically more aware of what you're doing.  Emotional hunger isn't satisfied once you're full. You keep wanting more and more, often eating until you're uncomfortably stuffed. Physical hunger, on the other hand, doesn't need to be stuffed. You feel satisfied when your stomach is full.  Emotional hunger isn't located in the stomach. Rather than a growling belly or a pang in your stomach, you feel your hunger as a craving you can't get out of your head. You're focused on specific textures, tastes, and smells.  Emotional hunger often leads to regret, guilt, or shame. When you eat to satisfy physical hunger, you're unlikely to feel guilty or ashamed because you're simply giving your body what it needs. If you feel guilty after you eat, it's likely because you know deep down that you're not eating for nutritional reasons.  Identify your emotional eating triggers What situations, places, or feelings make you reach for the comfort of  food? Most emotional eating is linked to unpleasant feelings, but it can also be triggered by positive emotions, such as rewarding yourself for achieving a goal or celebrating a holiday or happy event. Common causes of emotional eating include:  Stuffing emotions - Eating can be a way to temporarily silence or "stuff down" uncomfortable emotions, including anger, fear, sadness, anxiety, loneliness, resentment, and shame. While you're numbing yourself with food, you can avoid the difficult emotions you'd rather not feel.  Boredom or feelings of emptiness - Do you ever eat simply to give yourself something to do, to relieve boredom, or as a way to fill a void in your life? You feel unfulfilled and empty, and food is a way to occupy your mouth and your time. In the moment, it fills you up and distracts you from underlying feelings of purposelessness and dissatisfaction with your life.  Childhood habits - Think back to your childhood memories of food. Did your parents reward good behavior with ice cream, take you out for pizza when you got a good report card, or serve you sweets when you were feeling sad? These habits can often carry over into adulthood. Or your eating may be driven by nostalgia--for cherished memories of grilling burgers in the backyard with your dad or baking and eating cookies with your mom.  Social influences - Getting together with other people for a meal is a great way to relieve stress, but it can also lead to overeating. It's easy to overindulge simply because the food is there or because everyone else is eating. You may also overeat in social situations out of nervousness. Or perhaps your family or circle of friends encourages you to overeat, and it's easier to go along with the group.  Stress - Ever notice how stress makes you hungry? It's not just in your mind. When stress is chronic, as it so often is in our chaotic, fast-paced world, your body produces high levels of the stress  hormone, cortisol. Cortisol triggers cravings for salty, sweet, and fried foods--foods that give you a burst of energy and pleasure. The more uncontrolled stress in your life, the more likely you are to turn to food for emotional relief.  Find other ways to feed your feelings If you don't know how to manage your emotions in a way that doesn't involve food, you won't be able to control your eating habits for very long. Diets so often fail because they offer logical nutritional advice which only works if you have conscious control over your eating habits. It doesn't work when emotions hijack the process, demanding an immediate payoff with food.  In order to stop emotional eating, you have to find other ways to fulfill yourself emotionally. It's not enough to understand the cycle of emotional eating or even to understand your triggers, although that's a huge  first step. You need alternatives to food that you can turn to for emotional fulfillment.  Alternatives to emotional eating If you're depressed or lonely, call someone who always makes you feel better, play with your dog or cat, or look at a favorite photo or cherished memento.  If you're anxious, expend your nervous energy by dancing to your favorite song, squeezing a stress ball, or taking a brisk walk.  If you're exhausted, treat yourself with a hot cup of tea, take a bath, light some scented candles, or wrap yourself in a warm blanket.  If you're bored, read a good book, watch a comedy show, explore the outdoors, or turn to an activity you enjoy (woodworking, playing the guitar, shooting hoops, scrapbooking, etc.).  What is mindful eating? Mindful eating is a practice that develops your awareness of eating habits and allows you to pause between your triggers and your actions. Most emotional eaters feel powerless over their food cravings. When the urge to eat hits, you feel an almost unbearable tension that demands to be fed, right now. Because  you've tried to resist in the past and failed, you believe that your willpower just isn't up to snuff. But the truth is that you have more power over your cravings than you think.  Take 5 before you give in to a craving Emotional eating tends to be automatic and virtually mindless. Before you even realize what you're doing, you've reached for a tub of ice cream and polished off half of it. But if you can take a moment to pause and reflect when you're hit with a craving, you give yourself the opportunity to make a different decision.  Can you put off eating for five minutes? Or just start with one minute. Don't tell yourself you can't give in to the craving; remember, the forbidden is extremely tempting. Just tell yourself to wait.  While you're waiting, check in with yourself. How are you feeling? What's going on emotionally? Even if you end up eating, you'll have a better understanding of why you did it. This can help you set yourself up for a different response next time.  How to practice mindful eating Eating while you're also doing other things--such as watching TV, driving, or playing with your phone--can prevent you from fully enjoying your food. Since your mind is elsewhere, you may not feel satisfied or continue eating even though you're no longer hungry. Eating more mindfully can help focus your mind on your food and the pleasure of a meal and curb overeating.   Eat your meals in a calm place with no distractions, aside from any dining companions.  Try eating with your non-dominant hand or using chopsticks instead of a knife and fork. Eating in such a non-familiar way can slow down how fast you eat and ensure your mind stays focused on your food.  Allow yourself enough time not to have to rush your meal. Set a timer for 20 minutes and pace yourself so you spend at least that much time eating.  Take small bites and chew them well, taking time to notice the different flavors and textures of  each mouthful.  Put your utensils down between bites. Take time to consider how you feel--hungry, satiated--before picking up your utensils again.  Try to stop eating before you are full.It takes time for the signal to reach your brain that you've had enough. Don't feel obligated to always clean your plate.  When you've finished your food, take a few  moments to assess if you're really still hungry before opting for an extra serving or dessert.  Learn to accept your feelings--even the bad ones  While it may seem that the core problem is that you're powerless over food, emotional eating actually stems from feeling powerless over your emotions. You don't feel capable of dealing with your feelings head on, so you avoid them with food.  Recommended reading  Mini Habits for weight loss  Healthy Eating: A guide to the new nutrition - [employer] Report  10 Tips for Mindful Eating - How mindfulness can help you fully enjoy a meal and the experience of eating--with moderation and restraint. ([place])  Weight Loss: Gain Control of Emotional Eating - Tips to regain control of your eating habits. [facility])  Why Stress Causes People to Overeat -Tips on controlling stress eating. ([place])  Mindful Eating Meditations -Free online mindfulness meditations. ([employer]ful [employer])            High-Fiber Diet Fiber, also called dietary fiber, is a type of carbohydrate that is found in fruits, vegetables, whole grains, and beans. A high-fiber diet can have many health benefits. Your health care provider may recommend a high-fiber diet to help:  Prevent constipation. Fiber can make your bowel movements more regular.  Lower your cholesterol.  Relieve the following conditions: ? Swelling of veins in the anus (hemorrhoids). ? Swelling and irritation (inflammation) of specific areas of the digestive tract (uncomplicated  diverticulosis). ? A problem of the large intestine (colon) that sometimes causes pain and diarrhea (irritable bowel syndrome, IBS).  Prevent overeating as part of a weight-loss plan.  Prevent heart disease, type 2 diabetes, and certain cancers. What is my plan? The recommended daily fiber intake in grams (g) includes:  38 g for men age 37 or younger.  30 g for men over age 48.  37 g for women age 9 or younger.  21 g for women over age 61. You can get the recommended daily intake of dietary fiber by:  Eating a variety of fruits, vegetables, grains, and beans.  Taking a fiber supplement, if it is not possible to get enough fiber through your diet. What do I need to know about a high-fiber diet?  It is better to get fiber through food sources rather than from fiber supplements. There is not a lot of research about how effective supplements are.  Always check the fiber content on the nutrition facts label of any prepackaged food. Look for foods that contain 5 g of fiber or more per serving.  Talk with a diet and nutrition specialist (dietitian) if you have questions about specific foods that are recommended or not recommended for your medical condition, especially if those foods are not listed below.  Gradually increase how much fiber you consume. If you increase your intake of dietary fiber too quickly, you may have bloating, cramping, or gas.  Drink plenty of water. Water helps you to digest fiber. What are tips for following this plan?  Eat a wide variety of high-fiber foods.  Make sure that half of the grains that you eat each day are whole grains.  Eat breads and cereals that are made with whole-grain flour instead of refined flour or white flour.  Eat brown rice, bulgur wheat, or millet instead of white rice.  Start the day with a breakfast that is high in fiber, such as a cereal that contains 5 g of  fiber or more per serving.  Use beans in place of meat in soups,  salads, and pasta dishes.  Eat high-fiber snacks, such as berries, raw vegetables, nuts, and popcorn.  Choose whole fruits and vegetables instead of processed forms like juice or sauce. What foods can I eat?  Fruits Berries. Pears. Apples. Oranges. Avocado. Prunes and raisins. Dried figs. Vegetables Sweet potatoes. Spinach. Kale. Artichokes. Cabbage. Broccoli. Cauliflower. Green peas. Carrots. Squash. Grains Whole-grain breads. Multigrain cereal. Oats and oatmeal. Brown rice. Barley. Bulgur wheat. [place]. Quinoa. Bran muffins. Popcorn. Rye wafer crackers. Meats and other proteins [place], kidney, and pinto beans. Soybeans. Split peas. Lentils. Nuts and seeds. Dairy Fiber-fortified yogurt. Beverages Fiber-fortified soy milk. Fiber-fortified orange juice. Other foods Fiber bars. The items listed above may not be a complete list of recommended foods and beverages. Contact a dietitian for more options. What foods are not recommended? Fruits Fruit juice. Cooked, strained fruit. Vegetables Fried potatoes. Canned vegetables. Well-cooked vegetables. Grains White bread. Pasta made with refined flour. White rice. Meats and other proteins Fatty cuts of meat. Fried chicken or fried fish. Dairy Milk. Yogurt. Cream cheese. Sour cream. Fats and oils Butters. Beverages Soft drinks. Other foods Cakes and pastries. The items listed above may not be a complete list of foods and beverages to avoid. Contact a dietitian for more information. Summary  Fiber is a type of carbohydrate. It is found in fruits, vegetables, whole grains, and beans.  There are many health benefits of eating a high-fiber diet, such as preventing constipation, lowering blood cholesterol, helping with weight loss, and reducing your risk of heart disease, diabetes, and certain cancers.  Gradually increase your intake of fiber. Increasing too fast can result in cramping, bloating, and gas. Drink plenty of water while you  increase your fiber.  The best sources of fiber include whole fruits and vegetables, whole grains, nuts, seeds, and beans. This information is not intended to replace advice given to you by your health care provider. Make sure you discuss any questions you have with your health care provider. Document Revised: 11/29/2016 Document Reviewed: 11/29/2016 Elsevier Patient Education  2020 [employer].

## 2019-12-04 ENCOUNTER — Other Ambulatory Visit: Payer: Self-pay | Admitting: Adult Health

## 2019-12-04 ENCOUNTER — Ambulatory Visit: Payer: No Typology Code available for payment source | Admitting: Adult Health

## 2019-12-04 ENCOUNTER — Encounter: Payer: Self-pay | Admitting: Adult Health

## 2019-12-04 VITALS — BP 128/72 | Ht 58.5 in | Wt 261.6 lb

## 2019-12-04 DIAGNOSIS — G4733 Obstructive sleep apnea (adult) (pediatric): Secondary | ICD-10-CM

## 2019-12-04 DIAGNOSIS — Z9989 Dependence on other enabling machines and devices: Secondary | ICD-10-CM

## 2019-12-04 LAB — URINALYSIS, ROUTINE W REFLEX MICROSCOPIC
Bilirubin Urine: NEGATIVE
Glucose, UA: NEGATIVE
Hgb urine dipstick: NEGATIVE
Ketones, ur: NEGATIVE
Nitrite: NEGATIVE
RBC / HPF: NONE SEEN /HPF (ref 0–2)
Specific Gravity, Urine: 1.019 (ref 1.001–1.03)
pH: 6 (ref 5.0–8.0)

## 2019-12-04 LAB — CBC WITH DIFFERENTIAL/PLATELET
Absolute Monocytes: 514 cells/uL (ref 200–950)
Basophils Absolute: 40 cells/uL (ref 0–200)
Basophils Relative: 0.5 %
Eosinophils Absolute: 63 cells/uL (ref 15–500)
Eosinophils Relative: 0.8 %
HCT: 43.2 % (ref 35.0–45.0)
Hemoglobin: 14 g/dL (ref 11.7–15.5)
Lymphs Abs: 1762 cells/uL (ref 850–3900)
MCH: 27.8 pg (ref 27.0–33.0)
MCHC: 32.4 g/dL (ref 32.0–36.0)
MCV: 85.7 fL (ref 80.0–100.0)
MPV: 11.5 fL (ref 7.5–12.5)
Monocytes Relative: 6.5 %
Neutro Abs: 5522 cells/uL (ref 1500–7800)
Neutrophils Relative %: 69.9 %
Platelets: 256 10*3/uL (ref 140–400)
RBC: 5.04 10*6/uL (ref 3.80–5.10)
RDW: 13.9 % (ref 11.0–15.0)
Total Lymphocyte: 22.3 %
WBC: 7.9 10*3/uL (ref 3.8–10.8)

## 2019-12-04 LAB — COMPLETE METABOLIC PANEL WITH GFR
AG Ratio: 1.4 (calc) (ref 1.0–2.5)
ALT: 28 U/L (ref 6–29)
AST: 20 U/L (ref 10–35)
Albumin: 4.2 g/dL (ref 3.6–5.1)
Alkaline phosphatase (APISO): 100 U/L (ref 37–153)
BUN/Creatinine Ratio: 14 (calc) (ref 6–22)
BUN: 23 mg/dL (ref 7–25)
CO2: 27 mmol/L (ref 20–32)
Calcium: 9.8 mg/dL (ref 8.6–10.4)
Chloride: 104 mmol/L (ref 98–110)
Creat: 1.61 mg/dL — ABNORMAL HIGH (ref 0.50–0.99)
GFR, Est African American: 40 mL/min/{1.73_m2} — ABNORMAL LOW (ref >=60)
GFR, Est Non African American: 34 mL/min/{1.73_m2} — ABNORMAL LOW (ref >=60)
Globulin: 2.9 g/dL (calc) (ref 1.9–3.7)
Glucose, Bld: 111 mg/dL — ABNORMAL HIGH (ref 65–99)
Potassium: 4.3 mmol/L (ref 3.5–5.3)
Sodium: 142 mmol/L (ref 135–146)
Total Bilirubin: 0.7 mg/dL (ref 0.2–1.2)
Total Protein: 7.1 g/dL (ref 6.1–8.1)

## 2019-12-04 LAB — LIPID PANEL
Cholesterol: 226 mg/dL — ABNORMAL HIGH (ref <200)
HDL: 45 mg/dL — ABNORMAL LOW (ref >=50)
LDL Cholesterol (Calc): 152 mg/dL (calc) — ABNORMAL HIGH
Non-HDL Cholesterol (Calc): 181 mg/dL (calc) — ABNORMAL HIGH (ref <130)
Total CHOL/HDL Ratio: 5 (calc) — ABNORMAL HIGH (ref <5.0)
Triglycerides: 158 mg/dL — ABNORMAL HIGH (ref <150)

## 2019-12-04 LAB — MICROALBUMIN / CREATININE URINE RATIO
Creatinine, Urine: 210 mg/dL (ref 20–275)
Microalb Creat Ratio: 7 mcg/mg creat (ref <30)
Microalb, Ur: 1.4 mg/dL

## 2019-12-04 LAB — TSH: TSH: 3.4 mIU/L (ref 0.40–4.50)

## 2019-12-04 LAB — HEMOGLOBIN A1C
Hgb A1c MFr Bld: 6.5 % of total Hgb — ABNORMAL HIGH (ref <5.7)
Mean Plasma Glucose: 140 (calc)
eAG (mmol/L): 7.7 (calc)

## 2019-12-04 LAB — VITAMIN D 25 HYDROXY (VIT D DEFICIENCY, FRACTURES): Vit D, 25-Hydroxy: 24 ng/mL — ABNORMAL LOW (ref 30–100)

## 2019-12-04 LAB — MAGNESIUM: Magnesium: 2.3 mg/dL (ref 1.5–2.5)

## 2019-12-04 NOTE — Progress Notes (Signed)
PATIENT: [name] DOB: 1960/12/05  REASON FOR VISIT: follow up HISTORY FROM: patient  HISTORY OF PRESENT ILLNESS: Today 12/04/19:  [name] is a 59 year old female with a history of obstructive sleep apnea on CPAP.  Her download indicates that she use her machine nightly for compliance of 100%.  She use her machine greater than 4 hours each night.  On average she uses her machine 10 hours and 1 minute.  Her residual AHI is 0.9 on 12 cm of water with EPR 3.  Leak in the 95th percentile is 14.2 L/min.  She reports that the CPAP is working well for her.  She is interested in losing weight however reports that she has a sedentary job and has not been watching her diet.  HISTORY (Copied from [name]'s note) I have the pleasure to see here on 27 November 2018 [name] again,  a  59 year old Caucasian female patient is a history of obstructive sleep apnea using CPAP.  She also had a bout of atrial fibrillation in October 2018 she was admitted to the hospital and has been on Xarelto for anticoagulation.  She further takes gabapentin Zaroxolyn, Xanax as needed.Zebeta, albuterol inhaler as needed ipratropium albuterol as needed in case her  COPD exacerbates.  The patient has been 100% compliant with her CPAP also she confessed it took her several weeks to like it.  She now feels dependent on it she does not get good sleep without it.  Her average usage time is 10 hours 19 minutes percent pressure is still 12 cmH2O with 3 cm EPR AHI is 1.00.7 of these apneas being obstructive in origin.  She still sometimes snores or snorts 3 the CPAP which is indicated in the rare index 1.4 she does have minimal air leakage.  Based on these findings I did not need to change any of her settings but I like to talk to her about her diet and how to restrict carbohydrates and preprocessed foods.D  REVIEW OF SYSTEMS: Out of a complete 14 system review of symptoms, the patient complains only of the  following symptoms, and all other reviewed systems are negative.  FSS 59 ESS 0   ALLERGIES: Allergies  Allergen Reactions  . Sulfonamide Derivatives Hives and Itching  . Tikosyn [Dofetilide]     Prolonged qt    HOME MEDICATIONS: Outpatient Medications Prior to Visit  Medication Sig Dispense Refill  . albuterol (PROAIR HFA) 108 (90 Base) MCG/ACT inhaler Inhale 2 puffs into the lungs every 6 (six) hours as needed for wheezing or shortness of breath. 8 g 1  . albuterol (PROVENTIL) (2.5 MG/3ML) 0.083% nebulizer solution Take 3 mLs (2.5 mg total) by nebulization every 6 (six) hours as needed for wheezing or shortness of breath. 75 mL 12  . aspirin-acetaminophen-caffeine (EXCEDRIN MIGRAINE) 937-902-40 MG per tablet Take 1 tablet by mouth every 6 (six) hours as needed for headache.    . bisoprolol (ZEBETA) 5 MG tablet TAKE 1 TABLET DAILY FOR BLOOD PRESSURE 90 tablet 1  . chlorhexidine (PERIDEX) 0.12 % solution Use as directed 15 mLs in the mouth or throat 2 (two) times daily. 473 mL 0  . citalopram (CELEXA) 40 MG tablet Take 1 tablet Daily for Mood 90 tablet 3  . diltiazem (CARDIZEM CD) 360 MG 24 hr capsule Take 1 capsule (360 mg total) by mouth daily. Must be seen in office for further refills 90 capsule 0  . diphenhydrAMINE HCl, Sleep, (SLEEP AID) 50 MG CAPS Take  1 capsule by mouth daily.    . furosemide (LASIX) 40 MG tablet TAKE 1 TABLET 2 TO 3 X /DAY AS DIRECTED FOR BP & FLUID RETENTION / ANKLE SWELLING 270 tablet 3  . gabapentin (NEURONTIN) 300 MG capsule Take 1 capsule 3 x /day for Chronic Back Pain 270 capsule 1  . ipratropium-albuterol (DUONEB) 0.5-2.5 (3) MG/3ML SOLN TAKE 3 MLS BY NEBULIZATION EVERY 4 (FOUR) HOURS AS NEEDED. MAX:6 DOSES PER DAY 540 mL 0  . metolazone (ZAROXOLYN) 2.5 MG tablet As needed for increase in swelling (Patient taking differently: Take 2.5 mg by mouth once a week. As needed for increase in swelling) 90 tablet 3  . naproxen sodium (ANAPROX) 220 MG tablet Take  220 mg by mouth 2 (two) times daily as needed (back pain).    . nystatin cream (MYCOSTATIN) Apply 1 application topically 2 (two) times daily. (Patient taking differently: Apply 1 application topically 2 (two) times daily as needed for dry skin (ON FEET). ) 30 g 1  . polyethylene glycol (MIRALAX / GLYCOLAX) packet Take 17 g by mouth daily as needed for moderate constipation.     . potassium chloride (KLOR-CON) 10 MEQ tablet TAKE 2 TABLETS 2 X /DAY FOR POTASSIUM & TAKE ADDITIONAL 2 TABLETS WITH ADDITIONAL DOSE OF LASIX 360 tablet 3  . pravastatin (PRAVACHOL) 40 MG tablet Take 1 tablet (40 mg total) by mouth every evening. 90 tablet 3  . rivaroxaban (XARELTO) 20 MG TABS tablet Take 1 tablet Daily to Prevent Blood Clots 90 tablet 1  . spironolactone (ALDACTONE) 25 MG tablet TAKE 0.5 TABLETS (12.5 MG TOTAL) BY MOUTH DAILY. NEEDS APPT FOR FURTHER REFILLS 15 tablet 0  . traZODone (DESYREL) 100 MG tablet Take 1 tablet at Bedtime for Sleep 90 tablet 3  . TRELEGY ELLIPTA 100-62.5-25 MCG/INH AEPB INHALE 1 PUFF INTO THE LUNGS ONCE A DAY 180 each 1  . valACYclovir (VALTREX) 1000 MG tablet Take 1 tablet (1,000 mg total) by mouth 3 (three) times daily. 21 tablet 0  . ALPRAZolam (XANAX) 0.25 MG tablet 1-2 tablets as needed BID for panic attack- Suggest follow up in next 2 weeks if need more medication. (Patient not taking: Reported on 12/03/2019) 60 tablet 0  . triamcinolone cream (KENALOG) 0.5 % Apply 1 application topically 2 (two) times daily. (Patient not taking: Reported on 12/03/2019) 80 g 2   Facility-Administered Medications Prior to Visit  Medication Dose Route Frequency Provider Last Rate Last Admin  . ipratropium-albuterol (DUONEB) 0.5-2.5 (3) MG/3ML nebulizer solution 3 mL  3 mL Nebulization Once Vicie Mutters R, PA-C      . ipratropium-albuterol (DUONEB) 0.5-2.5 (3) MG/3ML nebulizer solution 3 mL  3 mL Nebulization Once Garnet Sierras, NP        PAST MEDICAL HISTORY: Past Medical History:    Diagnosis Date  . Anemia   . Anxiety   . Atrial flutter ([place])   . Borderline diabetes   . Chronic diastolic heart failure ([place])   . COPD (chronic obstructive pulmonary disease) ([place])   . Depression   . GERD (gastroesophageal reflux disease)   . HTN (hypertension)   . Hyperlipidemia   . Obstructive sleep apnea   . Persistent atrial fibrillation ([place])    Myoview 4/09: No ischemia;  echo 4/09: EF 60-65%;   Flecainide, beta blocker, Coumadin;    Unable to take Tikosyn 2/2 prolonged QT  . Pulmonary embolism (H[facility])    Right lower lobe diagnosed by CT 6/12  . Type II or unspecified type  diabetes mellitus without mention of complication, not stated as uncontrolled     PAST SURGICAL HISTORY: Past Surgical History:  Procedure Laterality Date  . basal skin can    . CARDIOVERSION  01/19/2012   Procedure: CARDIOVERSION;  Surgeon: Jolaine Artist, MD;  Location: [facility] ENDOSCOPY;  Service: Cardiovascular;  Laterality: N/A;  . CARDIOVERSION  01/21/2012   Procedure: CARDIOVERSION;  Surgeon: Carlena Bjornstad, MD;  Location: [place];  Service: Cardiovascular;  Laterality: N/A;  Rm 4733  . TEE WITHOUT CARDIOVERSION  01/19/2012   Procedure: TRANSESOPHAGEAL ECHOCARDIOGRAM (TEE);  Surgeon: Jolaine Artist, MD;  Location: [facility] ENDOSCOPY;  Service: Cardiovascular;  Laterality: N/A;  . TUBAL LIGATION  1996    FAMILY HISTORY: Family History  Problem Relation Age of Onset  . Colon cancer Mother   . Coronary artery disease Mother   . Coronary artery disease Father   . Asthma Father   . Emphysema Father   . Allergies Father   . Heart attack Maternal Grandfather   . Heart disease Maternal Grandmother   . Depression Daughter     SOCIAL HISTORY: Social History   Socioeconomic History  . Marital status: Legally Separated    Spouse name: n/a  . Number of children: 4  . Years of education: 59  . Highest education level: Not on file  Occupational History  . Occupation: clerical    Comment:  Manufacturing engineer  Tobacco Use  . Smoking status: Former Smoker    Packs/day: 2.00    Years: 35.00    Pack years: 70.00    Types: Cigarettes    Quit date: 09/08/2009    Years since quitting: 10.2  . Smokeless tobacco: Never Used  Vaping Use  . Vaping Use: Never used  Substance and Sexual Activity  . Alcohol use: No  . Drug use: No  . Sexual activity: Never    Birth control/protection: None  Other Topics Concern  . Not on file  Social History Narrative   Married but currently separated with 4 children, 2 of whom still live with her.  Youngest son is a Equities trader in [employer], to graduate in 07/2012.   Social Determinants of Health   Financial Resource Strain:   . Difficulty of Paying Living Expenses: Not on file  Food Insecurity:   . Worried About Charity fundraiser in the Last Year: Not on file  . Ran Out of Food in the Last Year: Not on file  Transportation Needs:   . Lack of Transportation (Medical): Not on file  . Lack of Transportation (Non-Medical): Not on file  Physical Activity:   . Days of Exercise per Week: Not on file  . Minutes of Exercise per Session: Not on file  Stress:   . Feeling of Stress : Not on file  Social Connections:   . Frequency of Communication with Friends and Family: Not on file  . Frequency of Social Gatherings with Friends and Family: Not on file  . Attends Religious Services: Not on file  . Active Member of Clubs or Organizations: Not on file  . Attends Archivist Meetings: Not on file  . Marital Status: Not on file  Intimate Partner Violence:   . Fear of Current or Ex-Partner: Not on file  . Emotionally Abused: Not on file  . Physically Abused: Not on file  . Sexually Abused: Not on file      PHYSICAL EXAM  Vitals:   12/04/19 0907  BP: 128/72  Weight: 261  lb 9.6 oz (118.7 kg)  Height: 4' 10.5" (1.486 m)   Body mass index is 53.74 kg/m.  Generalized: Well developed, in no acute distress  Chest: Lungs clear to auscultation  bilaterally  Neurological examination  Mentation: Alert oriented to time, place, history taking. Follows all commands speech and language fluent Cranial nerve II-XII: Extraocular movements were full, visual field were full on confrontational test Head turning and shoulder shrug  were normal and symmetric. Motor: The motor testing reveals 5 over 5 strength of all 4 extremities. Good symmetric motor tone is noted throughout.  Sensory: Sensory testing is intact to soft touch on all 4 extremities. No evidence of extinction is noted.  Gait and station: Gait is normal.    DIAGNOSTIC DATA (LABS, IMAGING, TESTING) - I reviewed patient records, labs, notes, testing and imaging myself where available.  Lab Results  Component Value Date   WBC 7.9 12/03/2019   HGB 14.0 12/03/2019   HCT 43.2 12/03/2019   MCV 85.7 12/03/2019   PLT 256 12/03/2019      Component Value Date/Time   NA 142 12/03/2019 1132   K 4.3 12/03/2019 1132   CL 104 12/03/2019 1132   CO2 27 12/03/2019 1132   GLUCOSE 111 (H) 12/03/2019 1132   BUN 23 12/03/2019 1132   CREATININE 1.61 (H) 12/03/2019 1132   CALCIUM 9.8 12/03/2019 1132   PROT 7.1 12/03/2019 1132   ALBUMIN 4.1 09/21/2016 1728   AST 20 12/03/2019 1132   ALT 28 12/03/2019 1132   ALKPHOS 85 09/21/2016 1728   BILITOT 0.7 12/03/2019 1132   GFRNONAA 34 (L) 12/03/2019 1132   GFRAA 40 (L) 12/03/2019 1132   Lab Results  Component Value Date   CHOL 226 (H) 12/03/2019   HDL 45 (L) 12/03/2019   LDLCALC 152 (H) 12/03/2019   TRIG 158 (H) 12/03/2019   CHOLHDL 5.0 (H) 12/03/2019   Lab Results  Component Value Date   HGBA1C 6.5 (H) 12/03/2019   Lab Results  Component Value Date   MVHQIONG29 528 11/30/2018   Lab Results  Component Value Date   TSH 3.40 12/03/2019      ASSESSMENT AND PLAN 59 y.o. year old female  has a past medical history of Anemia, Anxiety, Atrial flutter ([place]), Borderline diabetes, Chronic diastolic heart failure ([place]), COPD (chronic  obstructive pulmonary disease) ([place]), Depression, GERD (gastroesophageal reflux disease), HTN (hypertension), Hyperlipidemia, Obstructive sleep apnea, Persistent atrial fibrillation ([place]), Pulmonary embolism ([place]), and Type II or unspecified type diabetes mellitus without mention of complication, not stated as uncontrolled. here with:  1. OSA on CPAP  - CPAP compliance excellent - Good treatment of AHI  - Encourage patient to use CPAP nightly and > 4 hours each night - F/U in 1 year or sooner if needed   I spent 25 minutes of face-to-face and non-face-to-face time with patient.  This included previsit chart review, lab review, study review, order entry, electronic health record documentation, patient education.  Ward Givens, MSN, NP-C 12/04/2019, 9:14 AM [facility] [address] [place], [place] [address] [phone]

## 2019-12-04 NOTE — Patient Instructions (Signed)
Continue using CPAP nightly and greater than 4 hours each night °If your symptoms worsen or you develop new symptoms please let us know.  ° °

## 2020-01-01 ENCOUNTER — Other Ambulatory Visit: Payer: Self-pay | Admitting: Internal Medicine

## 2020-01-05 ENCOUNTER — Other Ambulatory Visit: Payer: Self-pay | Admitting: Internal Medicine

## 2020-01-05 DIAGNOSIS — I5032 Chronic diastolic (congestive) heart failure: Secondary | ICD-10-CM

## 2020-01-08 MED ORDER — CITALOPRAM HYDROBROMIDE 40 MG PO TABS
ORAL_TABLET | ORAL | 0 refills | Status: DC
Start: 2020-01-08 — End: 2020-05-23

## 2020-01-08 MED ORDER — BUSPIRONE HCL 10 MG PO TABS: ORAL_TABLET | ORAL | 0 refills | Status: DC

## 2020-02-04 ENCOUNTER — Other Ambulatory Visit: Payer: Self-pay | Admitting: Internal Medicine

## 2020-02-25 ENCOUNTER — Other Ambulatory Visit: Payer: Self-pay | Admitting: Internal Medicine

## 2020-02-25 DIAGNOSIS — F32A Depression, unspecified: Secondary | ICD-10-CM

## 2020-02-28 ENCOUNTER — Other Ambulatory Visit: Payer: Self-pay | Admitting: Adult Health Nurse Practitioner

## 2020-02-28 DIAGNOSIS — J441 Chronic obstructive pulmonary disease with (acute) exacerbation: Secondary | ICD-10-CM

## 2020-03-05 ENCOUNTER — Other Ambulatory Visit (HOSPITAL_COMMUNITY): Payer: Self-pay | Admitting: Internal Medicine

## 2020-03-05 ENCOUNTER — Other Ambulatory Visit: Payer: Self-pay

## 2020-03-05 ENCOUNTER — Encounter: Payer: Self-pay | Admitting: Emergency Medicine

## 2020-03-05 ENCOUNTER — Ambulatory Visit
Admission: EM | Admit: 2020-03-05 | Discharge: 2020-03-05 | Disposition: A | Discharge status: Home / Self Care | Payer: No Typology Code available for payment source | Attending: Urgent Care | Admitting: Urgent Care

## 2020-03-05 ENCOUNTER — Ambulatory Visit (INDEPENDENT_AMBULATORY_CARE_PROVIDER_SITE_OTHER): Payer: No Typology Code available for payment source

## 2020-03-05 DIAGNOSIS — R0602 Shortness of breath: Secondary | ICD-10-CM

## 2020-03-05 DIAGNOSIS — Z20822 Contact with and (suspected) exposure to covid-19: Secondary | ICD-10-CM | POA: Diagnosis not present

## 2020-03-05 DIAGNOSIS — R059 Cough, unspecified: Secondary | ICD-10-CM | POA: Diagnosis not present

## 2020-03-05 DIAGNOSIS — I4891 Unspecified atrial fibrillation: Secondary | ICD-10-CM

## 2020-03-05 DIAGNOSIS — Z1152 Encounter for screening for COVID-19: Secondary | ICD-10-CM

## 2020-03-05 DIAGNOSIS — Z86711 Personal history of pulmonary embolism: Secondary | ICD-10-CM

## 2020-03-05 DIAGNOSIS — Z8679 Personal history of other diseases of the circulatory system: Secondary | ICD-10-CM

## 2020-03-05 NOTE — ED Provider Notes (Signed)
[place]   MRN: [id number] DOB: 22-Nov-1960  Subjective:   [name] is a 60 y.o. female presenting for 2-week history of persistent itchy throat, congestion and cough.  Has felt intermittent wheezing and shortness of breath.  She has a history of CHF, atrial fibrillation, pulmonary embolism.  She is trying to stay up-to-date with her medications but has not had her diltiazem in about 3 weeks.  She is otherwise taking her regular medications.  Unfortunately she has not been able to use her albuterol as she does not have the right supplies for her.  Would like to see if we can give her a facemask for the nebulizer machine.  She is Covid vaccinated.   Current Facility-Administered Medications:  .  ipratropium-albuterol (DUONEB) 0.5-2.5 (3) MG/3ML nebulizer solution 3 mL, 3 mL, Nebulization, Once, [place]lier, [employer], PA-C .  ipratropium-albuterol (DUONEB) 0.5-2.5 (3) MG/3ML nebulizer solution 3 mL, 3 mL, Nebulization, Once, McClanahan, Kyra, NP  Current Outpatient Medications:  .  albuterol (PROAIR HFA) 108 (90 Base) MCG/ACT inhaler, Inhale 2 puffs into the lungs every 6 (six) hours as needed for wheezing or shortness of breath., Disp: 8 g, Rfl: 1 .  albuterol (PROVENTIL) (2.5 MG/3ML) 0.083% nebulizer solution, Take 3 mLs (2.5 mg total) by nebulization every 6 (six) hours as needed for wheezing or shortness of breath., Disp: 75 mL, Rfl: 12 .  aspirin-acetaminophen-caffeine (EXCEDRIN MIGRAINE) 250-250-65 MG per tablet, Take 1 tablet by mouth every 6 (six) hours as needed for headache., Disp: , Rfl:  .  bisoprolol (ZEBETA) 5 MG tablet, Take      1 tablet      Daily       for BP, Disp: 90 tablet, Rfl: 0 .  busPIRone (BUSPAR) 10 MG tablet, TAKE 1/2 TO 1 TABLET 3 X /DAY FOR ANXIERTY, Disp: 270 tablet, Rfl: 1 .  chlorhexidine (PERIDEX) 0.12 % solution, Use as directed 15 mLs in the mouth or throat 2 (two) times daily., Disp: 473 mL, Rfl: 0 .  citalopram (CELEXA) 40 MG tablet, Take       1 tablet      Daily      for Mood, Disp: 90 tablet, Rfl: 0 .  diltiazem (CARDIZEM CD) 360 MG 24 hr capsule, Take 1 capsule (360 mg total) by mouth daily. Must be seen in office for further refills, Disp: 90 capsule, Rfl: 0 .  diphenhydrAMINE HCl, Sleep, (SLEEP AID) 50 MG CAPS, Take 1 capsule by mouth daily., Disp: , Rfl:  .  furosemide (LASIX) 40 MG tablet, TAKE 1 TABLET 2 TO 3 X /DAY AS DIRECTED FOR BP & FLUID RETENTION / ANKLE SWELLING, Disp: 270 tablet, Rfl: 3 .  gabapentin (NEURONTIN) 300 MG capsule, Take 1 capsule 3 x /day for Chronic Back Pain, Disp: 270 capsule, Rfl: 1 .  ipratropium-albuterol (DUONEB) 0.5-2.5 (3) MG/3ML SOLN, TAKE 3 MLS BY NEBULIZATION EVERY 4 (FOUR) HOURS AS NEEDED. MAX:6 DOSES PER DAY, Disp: 540 mL, Rfl: 0 .  metolazone (ZAROXOLYN) 2.5 MG tablet, As needed for increase in swelling (Patient taking differently: Take 2.5 mg by mouth once a week. As needed for increase in swelling), Disp: 90 tablet, Rfl: 3 .  naproxen sodium (ANAPROX) 220 MG tablet, Take 220 mg by mouth 2 (two) times daily as needed (back pain)., Disp: , Rfl:  .  nystatin cream (MYCOSTATIN), Apply 1 application topically 2 (two) times daily. (Patient taking differently: Apply 1 application topically 2 (two) times daily as needed for dry skin (  ON FEET). ), Disp: 30 g, Rfl: 1 .  polyethylene glycol (MIRALAX / GLYCOLAX) packet, Take 17 g by mouth daily as needed for moderate constipation. , Disp: , Rfl:  .  potassium chloride (KLOR-CON) 10 MEQ tablet, TAKE 2 TABLETS 2 X /DAY FOR POTASSIUM & TAKE ADDITIONAL 2 TABLETS WITH ADDITIONAL DOSE OF LASIX, Disp: 360 tablet, Rfl: 3 .  pravastatin (PRAVACHOL) 40 MG tablet, Take 1 tablet (40 mg total) by mouth every evening., Disp: 90 tablet, Rfl: 3 .  rivaroxaban (XARELTO) 20 MG TABS tablet, Take 1 tablet Daily to Prevent Blood Clots, Disp: 90 tablet, Rfl: 1 .  spironolactone (ALDACTONE) 25 MG tablet, TAKE 0.5 TABLETS (12.5 MG TOTAL) BY MOUTH DAILY. NEEDS APPT FOR FURTHER  REFILLS, Disp: 15 tablet, Rfl: 0 .  traZODone (DESYREL) 100 MG tablet, Take   1 tablet   1 hour before Bedtime   as needed for Sleep, Disp: 90 tablet, Rfl: 0 .  TRELEGY ELLIPTA 100-62.5-25 MCG/INH AEPB, INHALE 1 PUFF INTO THE LUNGS ONCE A DAY, Disp: 180 each, Rfl: 1 .  valACYclovir (VALTREX) 1000 MG tablet, Take 1 tablet (1,000 mg total) by mouth 3 (three) times daily., Disp: 21 tablet, Rfl: 0   Allergies  Allergen Reactions  . Sulfonamide Derivatives Hives and Itching  . Tikosyn [Dofetilide]     Prolonged qt    Past Medical History:  Diagnosis Date  . Anemia   . Anxiety   . Atrial flutter ([place])   . Borderline diabetes   . Chronic diastolic heart failure ([place])   . COPD (chronic obstructive pulmonary disease) ([place])   . Depression   . GERD (gastroesophageal reflux disease)   . HTN (hypertension)   . Hyperlipidemia   . Obstructive sleep apnea   . Persistent atrial fibrillation ([place])    Myoview 4/09: No ischemia;  echo 4/09: EF 60-65%;   Flecainide, beta blocker, Coumadin;    Unable to take Tikosyn 2/2 prolonged QT  . Pulmonary embolism (HCC)    Right lower lobe diagnosed by CT 6/12  . Type II or unspecified type diabetes mellitus without mention of complication, not stated as uncontrolled      Past Surgical History:  Procedure Laterality Date  . basal skin can    . CARDIOVERSION  01/19/2012   Procedure: CARDIOVERSION;  Surgeon: Jolaine Artist, MD;  Location: [facility] ENDOSCOPY;  Service: Cardiovascular;  Laterality: N/A;  . CARDIOVERSION  01/21/2012   Procedure: CARDIOVERSION;  Surgeon: Carlena Bjornstad, MD;  Location: [place];  Service: Cardiovascular;  Laterality: N/A;  Rm 4733  . TEE WITHOUT CARDIOVERSION  01/19/2012   Procedure: TRANSESOPHAGEAL ECHOCARDIOGRAM (TEE);  Surgeon: Jolaine Artist, MD;  Location: [facility] ENDOSCOPY;  Service: Cardiovascular;  Laterality: N/A;  . TUBAL LIGATION  1996    Family History  Problem Relation Age of Onset  . Colon cancer Mother   .  Coronary artery disease Mother   . Coronary artery disease Father   . Asthma Father   . Emphysema Father   . Allergies Father   . Heart attack Maternal Grandfather   . Heart disease Maternal Grandmother   . Depression Daughter     Social History   Tobacco Use  . Smoking status: Former Smoker    Packs/day: 2.00    Years: 35.00    Pack years: 70.00    Types: Cigarettes    Quit date: 09/08/2009    Years since quitting: 10.4  . Smokeless tobacco: Never Used  Vaping Use  .  Vaping Use: Never used  Substance Use Topics  . Alcohol use: No  . Drug use: No    ROS   Objective:   Vitals: BP (!) 111/55 (BP Location: Left Arm)   Pulse 80   Temp 97.9 F (36.6 C) (Oral)   Resp 20   SpO2 95%   Physical Exam Constitutional:      General: She is not in acute distress.    Appearance: Normal appearance. She is well-developed. She is not ill-appearing, toxic-appearing or diaphoretic.  HENT:     Head: Normocephalic and atraumatic.     Nose: Nose normal.     Mouth/Throat:     Mouth: Mucous membranes are moist.  Eyes:     Extraocular Movements: Extraocular movements intact.     Pupils: Pupils are equal, round, and reactive to light.  Cardiovascular:     Rate and Rhythm: Normal rate and regular rhythm.     Pulses: Normal pulses.     Heart sounds: Normal heart sounds. No murmur heard. No friction rub. No gallop.   Pulmonary:     Effort: Pulmonary effort is normal. No respiratory distress.     Breath sounds: Normal breath sounds. No stridor. No wheezing, rhonchi or rales.  Skin:    General: Skin is warm and dry.     Findings: No rash.  Neurological:     Mental Status: She is alert and oriented to person, place, and time.  Psychiatric:        Mood and Affect: Mood normal.        Behavior: Behavior normal.        Thought Content: Thought content normal.     DG Chest 2 View  Result Date: 03/05/2020 CLINICAL DATA:  Cough, shortness of breath EXAM: CHEST - 2 VIEW COMPARISON:   03/05/2014 FINDINGS: Cardiomegaly. No confluent airspace opacities, effusions or edema. No acute bony abnormality. IMPRESSION: Cardiomegaly.  No active disease. Electronically Signed   By: Rolm Baptise M.D.   On: 03/05/2020 18:40    Assessment and Plan :   PDMP not reviewed this encounter.  1. Encounter for screening laboratory testing for COVID-19 virus   2. Cough   3. Shortness of breath   4. History of pulmonary embolism   5. History of CHF (congestive heart failure)   6. Atrial fibrillation, unspecified type ([place])     Suspect this is more related to her asthma.  Facemasks also provided to her.  Recommend supportive care.  Refilled her diltiazem.  Follow-up with PCP ASAP. Counseled patient on potential for adverse effects with medications prescribed/recommended today, ER and return-to-clinic precautions discussed, patient verbalized understanding.    Jaynee Eagles, PA-C 03/05/20 1900

## 2020-03-05 NOTE — ED Triage Notes (Signed)
Pt here for cough and congestion with itchy throat x 2 weeks; pt sts she needs a part for her neb machine but had meds

## 2020-03-06 ENCOUNTER — Telehealth (HOSPITAL_COMMUNITY): Payer: Self-pay | Admitting: Internal Medicine

## 2020-03-06 ENCOUNTER — Telehealth: Payer: Self-pay | Admitting: Internal Medicine

## 2020-03-06 LAB — SARS-COV-2, NAA 2 DAY TAT

## 2020-03-06 LAB — NOVEL CORONAVIRUS, NAA: SARS-CoV-2, NAA: DETECTED — AB

## 2020-03-06 NOTE — Telephone Encounter (Signed)
Pt request diltiazem 360 Mg , please send script to [facility], [place], pt scheduled appt 02/21, pt can be reached _[phone]. Thanks

## 2020-03-06 NOTE — Telephone Encounter (Signed)
Patient called to request new nebulizer, her machine no longer working. She was seen in Urgent Care 03/05/20, advised to treat symptoms w/ nebulizing solution.  Please enter order for DME Nebulizer and Supplies. Order will be sent to [facility]. She already has medicine for nebulizer. Thank you

## 2020-03-07 ENCOUNTER — Telehealth: Payer: Self-pay

## 2020-03-07 ENCOUNTER — Other Ambulatory Visit (HOSPITAL_COMMUNITY): Payer: Self-pay | Admitting: *Deleted

## 2020-03-07 NOTE — Telephone Encounter (Signed)
Diltiazem already filled Ordered on: 03/05/2020      Authorized by: Jaynee Eagles

## 2020-03-07 NOTE — Telephone Encounter (Signed)
Called to discuss with patient about COVID-19 symptoms and the use of one of the available treatments for those with mild to moderate Covid symptoms and at a high risk of hospitalization.  Pt appears to qualify for outpatient treatment due to co-morbid conditions and/or a member of an at-risk group in accordance with the FDA Emergency Use Authorization.    Symptom onset: 2 weeks ago Vaccinated: Yes Booster? No Immunocompromised? No Qualifiers: COPD,CHF Pt. Is out of 7 day window for treatment.    [name]

## 2020-03-10 ENCOUNTER — Ambulatory Visit: Payer: No Typology Code available for payment source | Admitting: Adult Health

## 2020-03-10 ENCOUNTER — Telehealth: Payer: Self-pay | Admitting: Adult Health

## 2020-03-10 NOTE — Telephone Encounter (Signed)
Patient states that she is still using her nebulizer. No new concerns at this time. Advised patient to check O2 levels with pulse oximeter and if levels dipped into the 80's, she needed to go to the ED.

## 2020-03-10 NOTE — Telephone Encounter (Signed)
called patient to confirm she recieved new nebulizer DME. Patient states she did receive new nebulizer from [facility].   Also, She would like a call back from nurse regarding COVID symptoms and treatments. Patient Engagement contacted patient 03/07/20, she would like to discuss. Please review and advise patient.

## 2020-03-18 NOTE — Progress Notes (Signed)
3 month follow up  Assessment and Plan:   Chronic atrial fibrillation (HCC) Resent CCB, rate controlled, on NOAC, has follow up cardio upcoming  Secondary hypertension - cardiology following, continue medications, DASH diet, exercise and monitor at home. Call if greater than 130/80.   Other pulmonary embolism without acute cor pulmonale, unspecified chronicity ([place]) Stay on CPAP, continue xarelto  Atrial flutter, unspecified type ([place]) Continue medications, rate controlled, on NOAC, follow up cardio  Pulmonary hypertension due to COPD ([place]) Stay on CPAP, weight loss advised  CHF NYHA class III (symptoms with mildly strenuous activities), chronic, diastolic (HCC) Follow up with cardio, monitor weight daily, weight loss advised  OSA and COPD overlap syndrome ([place]) Stay on CPAP, weight loss advised  Chronic obstructive pulmonary disease, unspecified COPD type ([place]) Continue inhalers  Type 2 diabetes mellitus with CKD ([place]) Discussed general issues about diabetes pathophysiology and management., Educational material distributed., Suggested low cholesterol diet., Encouraged aerobic exercise., Discussed foot care., Reminded to get yearly retinal exam. CKD 3b/4 - will not intiate metformin Discussed and initiated ozempic for weight loss benefit; some cardiovascular benefit as well; 0.25 mg weekly x 4 weeks, then 0.5 mg weekly if tolerating; first dose by sample demonstrated in office; follow up 3 months or sooner if concerns -     COMPLETE METABOLIC PANEL WITH GFR -     Hemoglobin A1c  Hyperlipidemia assocaited with T2DM (HCC) check lipids decrease fatty foods increase activity.  -     Lipid panel  CKD (chronic kidney disease) stage 3b, GFR 44-30 ml/min (HCC) avoid NSAIDS, control glucose/BP, will monitor -     COMPLETE METABOLIC PANEL WITH GFR  Other specified hypothyroidism -check TSH level, continue medications the same, reminded to take on an empty stomach 30-23mns before  food.  -     TSH  Depression, major, recurrent, moderate (HCC)/anxiety  - continue medications, stress management techniques discussed, increase water, good sleep hygiene discussed, increase exercise, and increase veggies.   Morbid obesity ([place] - follow up 3 months for progress monitoring; she will work on increasing fruit/veggies intake - increase veggies, try to limit snacking - long discussion about weight loss, diet, and exercise - add ozempic; first dose given in office and tolerated well; 6 weeks samples provided poor candidate for phentermine due to heart, metformin due to renal functions - given information about emotional eating  Vitamin D deficiency -     VITAMIN D 25 Hydroxy (Vit-D Deficiency, Fractures)  Gastroesophageal reflux disease, esophagitis presence not specified Continue PPI/H2 blocker, diet discussed  Noncompliance Hx of; improved; will discuss barriers regularly and individualize plan to assist with compliance  Medication management -     Magnesium  Family history of colon cancer - OVERDUE, has constipation as well, get on miralax and needs colonoscopy - She discussed with Dr. MCollene Mares concern due to cardiac history and reports was recommended weight loss prior to colonoscopy. WEIGHT LOSS HIGH PRIORITY THIS YEAR   Constipation Add miralax, increase movement during day, increase fiber intake, and maximize fluid intake as recommended by cardiology  Discussed med's effects and SE's. Screening labs and tests as requested with regular follow-up as recommended. Over 30 minutes of exam, counseling, chart review, and complex decision making was performed this visit.  Future Appointments  Date Time Provider [place] 03/31/2020  8:[facility] 03/31/2020  9:40 AM Bensimhon, DShaune Pascal MD [facility] None  06/09/2020  8:45 AM CLiane Comber NP [facility] None  12/03/2020  9:00 AM Ward Givens, NP [facility] None  12/03/2020 10:00 AM Liane Comber, NP [facility] None    HPI  60 y.o. female  presents for 3 month follow up for T2DM with CKD, hyperlipidemia, htn, vit D def, depression, morbid obesity, hypothyroid.   She reports had mild case of covid 19 in Jan 2022, reports has recovered other than decreased taste.   Retired; She doesn't drive, family drives as needed.   Most stress is from family issues, son thinking about the [employer].  She has hx of recurrent depression, on celexa 40 mg, trazodone 50 mg, rare PRN xanax. Also taking gabapentin at night which helps with sleep.   She takes naproxen PRN rarely for aches and pains.   BMI is Body mass index is 53.21 kg/m., she is working on diet and exercise. She reports just started working with dietician from work, doing more yogurt/fruit, eggs. Doesn't track weight.   Wt Readings from Last 3 Encounters:  03/19/20 259 lb (117.5 kg)  12/04/19 261 lb 9.6 oz (118.7 kg)  12/03/19 260 lb (117.9 kg)   She has a history of afib/flutter and CHF, she is on amiodarone, she is on xarelto, spriolactone 1/2 at night, following with Dr. Haroldine Laws.  Reports has not been taking diltiazem x 1 month after running out and having trouble getting refilled, has noted some palpitations and shortness of breath. She is on lasix 2 a day and zaroxyln 1 x a week and 6-8 potassium a day.   She also has PHTN due to COPD and OSA, she is on CPAP, follows with neurology, endorses 100% compliance. She follows with Dr. Brett Fairy.   She is on trellegy ellipta for COPD, nebs PRN and reports doing well.   Her blood pressure has been controlled at home, today their BP is BP: 114/78  She does not workout. She denies chest pain, shortness of breath, dizziness.  She is on cholesterol medication (newly prescribed pravastatin and tolerating better, ? Vaginal itching with rosuvastatin) and denies myalgias. Her cholesterol is not at goal. The cholesterol last visit was:   Lab Results  Component Value Date   CHOL 226  (H) 12/03/2019   HDL 45 (L) 12/03/2019   LDLCALC 152 (H) 12/03/2019   TRIG 158 (H) 12/03/2019   CHOLHDL 5.0 (H) 12/03/2019   She has been working on diet and exercise for lifestyle managed T2 diabetes that has fortunately remained <7%, she is on bASA, she is on ACE/ARB and denies paresthesia of the feet, polydipsia, polyuria and visual disturbances.  Last A1C in the office was:  Lab Results  Component Value Date   HGBA1C 6.5 (H) 12/03/2019    She has CKD IIIb r/t htn and T2DM monitored here, admits to poor water intake recently-  Lab Results  Component Value Date   GFRNONAA 34 (L) 12/03/2019   GFRNONAA 37 (L) 07/18/2019   GFRNONAA 37 (L) 07/10/2019   She is on thyroid medication. Her medication was not changed last visit.   Lab Results  Component Value Date   TSH 3.40 12/03/2019   Patient is on Vitamin D supplement and now calcium pills, states she has less back pain. Taking 5000 IU.   Lab Results  Component Value Date   VD25OH 24 (L) 12/03/2019        Current Medications:  Current Outpatient Medications on File Prior to Visit  Medication Sig Dispense Refill  . albuterol (PROAIR HFA) 108 (90 Base) MCG/ACT inhaler Inhale 2 puffs into  the lungs every 6 (six) hours as needed for wheezing or shortness of breath. 8 g 1  . albuterol (PROVENTIL) (2.5 MG/3ML) 0.083% nebulizer solution Take 3 mLs (2.5 mg total) by nebulization every 6 (six) hours as needed for wheezing or shortness of breath. 75 mL 12  . aspirin-acetaminophen-caffeine (EXCEDRIN MIGRAINE) [phone] MG per tablet Take 1 tablet by mouth every 6 (six) hours as needed for headache.    . bisoprolol (ZEBETA) 5 MG tablet Take      1 tablet      Daily       for BP 90 tablet 0  . busPIRone (BUSPAR) 10 MG tablet TAKE 1/2 TO 1 TABLET 3 X /DAY FOR ANXIERTY 270 tablet 1  . chlorhexidine (PERIDEX) 0.12 % solution Use as directed 15 mLs in the mouth or throat 2 (two) times daily. 473 mL 0  . citalopram (CELEXA) 40 MG tablet Take       1 tablet      Daily      for Mood 90 tablet 0  . diphenhydrAMINE HCl, Sleep, 50 MG CAPS Take 1 capsule by mouth daily.    . furosemide (LASIX) 40 MG tablet TAKE 1 TABLET 2 TO 3 X /DAY AS DIRECTED FOR BP & FLUID RETENTION / ANKLE SWELLING 270 tablet 3  . gabapentin (NEURONTIN) 300 MG capsule Take 1 capsule 3 x /day for Chronic Back Pain 270 capsule 1  . ipratropium-albuterol (DUONEB) 0.5-2.5 (3) MG/3ML SOLN TAKE 3 MLS BY NEBULIZATION EVERY 4 (FOUR) HOURS AS NEEDED. MAX:6 DOSES PER DAY 540 mL 0  . metolazone (ZAROXOLYN) 2.5 MG tablet As needed for increase in swelling (Patient taking differently: Take 2.5 mg by mouth once a week. As needed for increase in swelling) 90 tablet 3  . naproxen sodium (ANAPROX) 220 MG tablet Take 220 mg by mouth 2 (two) times daily as needed (back pain).    . nystatin cream (MYCOSTATIN) Apply 1 application topically 2 (two) times daily. (Patient taking differently: Apply 1 application topically 2 (two) times daily as needed for dry skin (ON FEET).) 30 g 1  . polyethylene glycol (MIRALAX / GLYCOLAX) packet Take 17 g by mouth daily as needed for moderate constipation.     . potassium chloride (KLOR-CON) 10 MEQ tablet TAKE 2 TABLETS 2 X /DAY FOR POTASSIUM & TAKE ADDITIONAL 2 TABLETS WITH ADDITIONAL DOSE OF LASIX 360 tablet 3  . pravastatin (PRAVACHOL) 40 MG tablet Take 1 tablet (40 mg total) by mouth every evening. 90 tablet 3  . rivaroxaban (XARELTO) 20 MG TABS tablet Take 1 tablet Daily to Prevent Blood Clots 90 tablet 1  . traZODone (DESYREL) 100 MG tablet Take   1 tablet   1 hour before Bedtime   as needed for Sleep 90 tablet 0  . TRELEGY ELLIPTA 100-62.5-25 MCG/INH AEPB INHALE 1 PUFF INTO THE LUNGS ONCE A DAY 180 each 1  . diltiazem (CARDIZEM CD) 360 MG 24 hr capsule Take 1 capsule (360 mg total) by mouth daily. (Patient not taking: Reported on 03/19/2020) 30 capsule 0  . spironolactone (ALDACTONE) 25 MG tablet TAKE 0.5 TABLETS (12.5 MG TOTAL) BY MOUTH DAILY. NEEDS APPT FOR  FURTHER REFILLS 15 tablet 0  . valACYclovir (VALTREX) 1000 MG tablet Take 1 tablet (1,000 mg total) by mouth 3 (three) times daily. (Patient not taking: Reported on 03/19/2020) 21 tablet 0   Current Facility-Administered Medications on File Prior to Visit  Medication Dose Route Frequency Provider Last Rate Last Admin  .  ipratropium-albuterol (DUONEB) 0.5-2.5 (3) MG/3ML nebulizer solution 3 mL  3 mL Nebulization Once Vicie Mutters R, PA-C      . ipratropium-albuterol (DUONEB) 0.5-2.5 (3) MG/3ML nebulizer solution 3 mL  3 mL Nebulization Once Garnet Sierras, NP       Allergies:  Allergies  Allergen Reactions  . Sulfonamide Derivatives Hives and Itching  . Tikosyn [Dofetilide]     Prolonged qt   Medical History:  She has Depression, major, recurrent, moderate ([place]); OSA and COPD overlap syndrome ([place]); G E R D; CHF (congestive heart failure), NYHA class III, chronic, diastolic ([place]); History of pulmonary embolus (PE); Atrial flutter (HCC); COPD (chronic obstructive pulmonary disease) ([place]); Atrial fibrillation ([place]); Hypothyroid; Hyperlipidemia associated with type 2 diabetes mellitus ([place]); HTN (hypertension); Type 2 diabetes with kidney complications ([place]); Anxiety; Vitamin D deficiency; Morbid obesity ([place]); Noncompliance; CKD stage G3b/A1, GFR 30-44 and albumin creatinine ratio <30 mg/g ([place]); Pulmonary hypertension due to COPD ([place]); Obstructive sleep apnea treated with continuous positive airway pressure (CPAP); and Constipation on their problem list.   Surgical History:  She has a past surgical history that includes Tubal ligation (1996); TEE without cardioversion (01/19/2012); Cardioversion (01/19/2012); Cardioversion (01/21/2012); and basal skin can. Family History:  Herfamily history includes Allergies in her father; Asthma in her father; Colon cancer in her mother; Coronary artery disease in her father and mother; Depression in her daughter; Emphysema in her father; Heart attack in her  maternal grandfather; Heart disease in her maternal grandmother. Social History:  She reports that she quit smoking about 10 years ago. Her smoking use included cigarettes. She has a 70.00 pack-year smoking history. She has never used smokeless tobacco. She reports that she does not drink alcohol and does not use drugs.  Review of Systems: Review of Systems  Constitutional: Negative.  Negative for malaise/fatigue and weight loss.  HENT: Negative.  Negative for hearing loss and tinnitus.   Eyes: Negative.  Negative for blurred vision and double vision.  Respiratory: Negative.  Negative for cough, sputum production, shortness of breath and wheezing.   Cardiovascular: Negative.  Negative for chest pain, palpitations, orthopnea, claudication, leg swelling and PND.  Gastrointestinal: Negative.  Negative for abdominal pain, blood in stool, constipation, diarrhea, heartburn, melena, nausea and vomiting.  Genitourinary: Negative.   Musculoskeletal: Positive for joint pain (intermittent hips, back). Negative for falls and myalgias.  Skin: Negative.  Negative for rash.  Neurological: Negative for dizziness, tingling, sensory change, weakness and headaches.  Endo/Heme/Allergies: Negative for polydipsia.  Psychiatric/Behavioral: Positive for depression. Negative for memory loss, substance abuse and suicidal ideas. The patient is not nervous/anxious and does not have insomnia.   All other systems reviewed and are negative.   Physical Exam: Estimated body mass index is 53.21 kg/m as calculated from the following:   Height as of 12/04/19: 4' 10.5" (1.486 m).   Weight as of this encounter: 259 lb (117.5 kg).  BP 114/78   Pulse (!) 104   Temp (!) 96.4 F (35.8 C)   Wt 259 lb (117.5 kg)   SpO2 94%   BMI 53.21 kg/m  General Appearance: Well dressed, moribdly obese, in no apparent distress. Eyes: PERRLA, EOMs, conjunctiva no swelling or erythema Sinuses: No Frontal/maxillary tenderness ENT/Mouth:  Ext aud canals clear, TMs without erythema, bulging. No erythema, swelling, or exudate on post pharynx.Tonsils not swollen or erythematous. Hearing normal. Crowded mouth Neck: Supple, thyroid normal.  Respiratory: Decreased breath sounds bilateral, with wheezing and decreased breath sounds bilateral lower lobes Cardio: Irreg, irreg,  No murmur, minimal edema Abdomen: Soft, + BS, Morbidly obese,  Non tender, no guarding, rebound, no palpable hernias, masses. Lymphatics: Non tender without lymphadenopathy.  Musculoskeletal: Body habitus limits exam, symmetrical ROM, 5/5 strength, antalgic gait with cane Skin: warm, dry intact, without notable rash, lesions, ecchymosis. Bilateral feet thickened long toenail no ulcers. Has very thick callouses bil Neuro: Cranial nerves intact. No cerebellar symptoms. Sensation intact.  Psych: Awake and oriented X 3,  normal affect, Insight and Judgment appropriate.    Gorden Harms Trinidad Petron 9:12 AM [facility]

## 2020-03-19 ENCOUNTER — Ambulatory Visit: Payer: No Typology Code available for payment source | Admitting: Adult Health

## 2020-03-19 ENCOUNTER — Encounter: Payer: Self-pay | Admitting: Adult Health

## 2020-03-19 ENCOUNTER — Other Ambulatory Visit: Payer: Self-pay | Admitting: Adult Health

## 2020-03-19 ENCOUNTER — Other Ambulatory Visit: Payer: Self-pay

## 2020-03-19 VITALS — BP 114/78 | HR 104 | Temp 96.4°F | Wt 259.0 lb

## 2020-03-19 DIAGNOSIS — I5032 Chronic diastolic (congestive) heart failure: Secondary | ICD-10-CM | POA: Diagnosis not present

## 2020-03-19 DIAGNOSIS — I159 Secondary hypertension, unspecified: Secondary | ICD-10-CM

## 2020-03-19 DIAGNOSIS — E038 Other specified hypothyroidism: Secondary | ICD-10-CM

## 2020-03-19 DIAGNOSIS — I4819 Other persistent atrial fibrillation: Secondary | ICD-10-CM

## 2020-03-19 DIAGNOSIS — E1122 Type 2 diabetes mellitus with diabetic chronic kidney disease: Secondary | ICD-10-CM

## 2020-03-19 DIAGNOSIS — E1169 Type 2 diabetes mellitus with other specified complication: Secondary | ICD-10-CM

## 2020-03-19 DIAGNOSIS — I2723 Pulmonary hypertension due to lung diseases and hypoxia: Secondary | ICD-10-CM

## 2020-03-19 DIAGNOSIS — F331 Major depressive disorder, recurrent, moderate: Secondary | ICD-10-CM

## 2020-03-19 DIAGNOSIS — E785 Hyperlipidemia, unspecified: Secondary | ICD-10-CM

## 2020-03-19 DIAGNOSIS — F419 Anxiety disorder, unspecified: Secondary | ICD-10-CM

## 2020-03-19 DIAGNOSIS — N1832 Chronic kidney disease, stage 3b: Secondary | ICD-10-CM

## 2020-03-19 DIAGNOSIS — G4733 Obstructive sleep apnea (adult) (pediatric): Secondary | ICD-10-CM

## 2020-03-19 DIAGNOSIS — J449 Chronic obstructive pulmonary disease, unspecified: Secondary | ICD-10-CM

## 2020-03-19 DIAGNOSIS — E559 Vitamin D deficiency, unspecified: Secondary | ICD-10-CM

## 2020-03-19 MED ORDER — DILTIAZEM HCL ER COATED BEADS 360 MG PO CP24: 360.0000 mg | ORAL_CAPSULE | Freq: Every day | ORAL | 0 refills | Status: DC

## 2020-03-19 MED ORDER — OZEMPIC (0.25 OR 0.5 MG/DOSE) 2 MG/1.5ML ~~LOC~~ SOPN: PEN_INJECTOR | SUBCUTANEOUS | 0 refills | Status: DC

## 2020-03-19 NOTE — Patient Instructions (Addendum)
Goals    . DIET - EAT MORE FRUITS AND VEGETABLES     Aim for 5-7+ servings daily, also 1 serving of beans daily         Recommend getting scale- weigh daily in the morning to track water weight - call if more than 3 lb weight gain overnight, or more than 5 lb in 1 week   Semaglutide injection solution What is this medicine? SEMAGLUTIDE (Sem a GLOO tide) is used to improve blood sugar control in adults with type 2 diabetes. This medicine may be used with other diabetes medicines. This drug may also reduce the risk of heart attack or stroke if you have type 2 diabetes and risk factors for heart disease. This medicine may be used for other purposes; ask your health care provider or pharmacist if you have questions. COMMON BRAND NAME(S): OZEMPIC What should I tell my health care provider before I take this medicine? They need to know if you have any of these conditions:  endocrine tumors (MEN 2) or if someone in your family had these tumors  eye disease, vision problems  history of pancreatitis  kidney disease  stomach problems  thyroid cancer or if someone in your family had thyroid cancer  an unusual or allergic reaction to semaglutide, other medicines, foods, dyes, or preservatives  pregnant or trying to get pregnant  breast-feeding How should I use this medicine? This medicine is for injection under the skin of your upper leg (thigh), stomach area, or upper arm. It is given once every week (every 7 days). You will be taught how to prepare and give this medicine. Use exactly as directed. Take your medicine at regular intervals. Do not take it more often than directed. If you use this medicine with insulin, you should inject this medicine and the insulin separately. Do not mix them together. Do not give the injections right next to each other. Change (rotate) injection sites with each injection. It is important that you put your used needles and syringes in a special sharps  container. Do not put them in a trash can. If you do not have a sharps container, call your pharmacist or healthcare provider to get one. A special MedGuide will be given to you by the pharmacist with each prescription and refill. Be sure to read this information carefully each time. This drug comes with INSTRUCTIONS FOR USE. Ask your pharmacist for directions on how to use this drug. Read the information carefully. Talk to your pharmacist or health care provider if you have questions. Talk to your pediatrician regarding the use of this medicine in children. Special care may be needed. Overdosage: If you think you have taken too much of this medicine contact a poison control center or emergency room at once. NOTE: This medicine is only for you. Do not share this medicine with others. What if I miss a dose? If you miss a dose, take it as soon as you can within 5 days after the missed dose. Then take your next dose at your regular weekly time. If it has been longer than 5 days after the missed dose, do not take the missed dose. Take the next dose at your regular time. Do not take double or extra doses. If you have questions about a missed dose, contact your health care provider for advice. What may interact with this medicine?  other medicines for diabetes Many medications may cause changes in blood sugar, these include:  alcohol containing beverages  antiviral medicines for HIV or AIDS  aspirin and aspirin-like drugs  certain medicines for blood pressure, heart disease, irregular heart beat  chromium  diuretics  female hormones, such as estrogens or progestins, birth control pills  fenofibrate  gemfibrozil  isoniazid  lanreotide  female hormones or anabolic steroids  MAOIs like Carbex, Eldepryl, Marplan, Nardil, and Parnate  medicines for weight loss  medicines for allergies, asthma, cold, or cough  medicines for depression, anxiety, or psychotic  disturbances  niacin  nicotine  NSAIDs, medicines for pain and inflammation, like ibuprofen or naproxen  octreotide  pasireotide  pentamidine  phenytoin  probenecid  quinolone antibiotics such as ciprofloxacin, levofloxacin, ofloxacin  some herbal dietary supplements  steroid medicines such as prednisone or cortisone  sulfamethoxazole; trimethoprim  thyroid hormones Some medications can hide the warning symptoms of low blood sugar (hypoglycemia). You may need to monitor your blood sugar more closely if you are taking one of these medications. These include:  beta-blockers, often used for high blood pressure or heart problems (examples include atenolol, metoprolol, propranolol)  clonidine  guanethidine  reserpine This list may not describe all possible interactions. Give your health care provider a list of all the medicines, herbs, non-prescription drugs, or dietary supplements you use. Also tell them if you smoke, drink alcohol, or use illegal drugs. Some items may interact with your medicine. What should I watch for while using this medicine? Visit your doctor or health care professional for regular checks on your progress. Drink plenty of fluids while taking this medicine. Check with your doctor or health care professional if you get an attack of severe diarrhea, nausea, and vomiting. The loss of too much body fluid can make it dangerous for you to take this medicine. A test called the HbA1C (A1C) will be monitored. This is a simple blood test. It measures your blood sugar control over the last 2 to 3 months. You will receive this test every 3 to 6 months. Learn how to check your blood sugar. Learn the symptoms of low and high blood sugar and how to manage them. Always carry a quick-source of sugar with you in case you have symptoms of low blood sugar. Examples include hard sugar candy or glucose tablets. Make sure others know that you can choke if you eat or drink when  you develop serious symptoms of low blood sugar, such as seizures or unconsciousness. They must get medical help at once. Tell your doctor or health care professional if you have high blood sugar. You might need to change the dose of your medicine. If you are sick or exercising more than usual, you might need to change the dose of your medicine. Do not skip meals. Ask your doctor or health care professional if you should avoid alcohol. Many nonprescription cough and cold products contain sugar or alcohol. These can affect blood sugar. Pens should never be shared. Even if the needle is changed, sharing may result in passing of viruses like hepatitis or HIV. Wear a medical ID bracelet or chain, and carry a card that describes your disease and details of your medicine and dosage times. Do not become pregnant while taking this medicine. Women should inform their doctor if they wish to become pregnant or think they might be pregnant. There is a potential for serious side effects to an unborn child. Talk to your health care professional or pharmacist for more information. What side effects may I notice from receiving this medicine? Side effects that you should  report to your doctor or health care professional as soon as possible:  allergic reactions like skin rash, itching or hives, swelling of the face, lips, or tongue  breathing problems  changes in vision  diarrhea that continues or is severe  lump or swelling on the neck  severe nausea  signs and symptoms of infection like fever or chills; cough; sore throat; pain or trouble passing urine  signs and symptoms of low blood sugar such as feeling anxious, confusion, dizziness, increased hunger, unusually weak or tired, sweating, shakiness, cold, irritable, headache, blurred vision, fast heartbeat, loss of consciousness  signs and symptoms of kidney injury like trouble passing urine or change in the amount of urine  trouble swallowing  unusual  stomach upset or pain  vomiting Side effects that usually do not require medical attention (report to your doctor or health care professional if they continue or are bothersome):  constipation  diarrhea  nausea  pain, redness, or irritation at site where injected  stomach upset This list may not describe all possible side effects. Call your doctor for medical advice about side effects. You may report side effects to FDA at 1-800-FDA-1088. Where should I keep my medicine? Keep out of the reach of children. Store unopened pens in a refrigerator between 2 and 8 degrees C (36 and 46 degrees F). Do not freeze. Protect from light and heat. After you first use the pen, it can be stored for 56 days at room temperature between 15 and 30 degrees C (59 and 86 degrees F) or in a refrigerator. Throw away your used pen after 56 days or after the expiration date, whichever comes first. Do not store your pen with the needle attached. If the needle is left on, medicine may leak from the pen. NOTE: This sheet is a summary. It may not cover all possible information. If you have questions about this medicine, talk to your doctor, pharmacist, or health care provider.  2021 Elsevier/Gold Standard (2018-10-10 09:41:51)

## 2020-03-20 ENCOUNTER — Other Ambulatory Visit: Payer: Self-pay | Admitting: Adult Health

## 2020-03-20 LAB — COMPLETE METABOLIC PANEL WITH GFR
AG Ratio: 1.5 (calc) (ref 1.0–2.5)
ALT: 25 U/L (ref 6–29)
AST: 18 U/L (ref 10–35)
Albumin: 4.1 g/dL (ref 3.6–5.1)
Alkaline phosphatase (APISO): 99 U/L (ref 37–153)
BUN/Creatinine Ratio: 13 (calc) (ref 6–22)
BUN: 21 mg/dL (ref 7–25)
CO2: 29 mmol/L (ref 20–32)
Calcium: 9.6 mg/dL (ref 8.6–10.4)
Chloride: 103 mmol/L (ref 98–110)
Creat: 1.68 mg/dL — ABNORMAL HIGH (ref 0.50–0.99)
GFR, Est African American: 38 mL/min/{1.73_m2} — ABNORMAL LOW (ref >=60)
GFR, Est Non African American: 32 mL/min/{1.73_m2} — ABNORMAL LOW (ref >=60)
Globulin: 2.7 g/dL (calc) (ref 1.9–3.7)
Glucose, Bld: 113 mg/dL — ABNORMAL HIGH (ref 65–99)
Potassium: 3.8 mmol/L (ref 3.5–5.3)
Sodium: 144 mmol/L (ref 135–146)
Total Bilirubin: 0.7 mg/dL (ref 0.2–1.2)
Total Protein: 6.8 g/dL (ref 6.1–8.1)

## 2020-03-20 LAB — CBC WITH DIFFERENTIAL/PLATELET
Absolute Monocytes: 380 cells/uL (ref 200–950)
Basophils Absolute: 23 cells/uL (ref 0–200)
Basophils Relative: 0.3 %
Eosinophils Absolute: 53 cells/uL (ref 15–500)
Eosinophils Relative: 0.7 %
HCT: 44.2 % (ref 35.0–45.0)
Hemoglobin: 14.5 g/dL (ref 11.7–15.5)
Lymphs Abs: 1786 cells/uL (ref 850–3900)
MCH: 28.3 pg (ref 27.0–33.0)
MCHC: 32.8 g/dL (ref 32.0–36.0)
MCV: 86.3 fL (ref 80.0–100.0)
MPV: 11.7 fL (ref 7.5–12.5)
Monocytes Relative: 5 %
Neutro Abs: 5358 cells/uL (ref 1500–7800)
Neutrophils Relative %: 70.5 %
Platelets: 251 10*3/uL (ref 140–400)
RBC: 5.12 10*6/uL — ABNORMAL HIGH (ref 3.80–5.10)
RDW: 13.6 % (ref 11.0–15.0)
Total Lymphocyte: 23.5 %
WBC: 7.6 10*3/uL (ref 3.8–10.8)

## 2020-03-20 LAB — LIPID PANEL
Cholesterol: 169 mg/dL (ref <200)
HDL: 44 mg/dL — ABNORMAL LOW (ref >=50)
LDL Cholesterol (Calc): 102 mg/dL (calc) — ABNORMAL HIGH
Non-HDL Cholesterol (Calc): 125 mg/dL (calc) (ref <130)
Total CHOL/HDL Ratio: 3.8 (calc) (ref <5.0)
Triglycerides: 133 mg/dL (ref <150)

## 2020-03-20 LAB — HEMOGLOBIN A1C
Hgb A1c MFr Bld: 6.6 % of total Hgb — ABNORMAL HIGH (ref <5.7)
Mean Plasma Glucose: 143 mg/dL
eAG (mmol/L): 7.9 mmol/L

## 2020-03-20 LAB — TSH: TSH: 2.97 mIU/L (ref 0.40–4.50)

## 2020-03-20 LAB — MAGNESIUM: Magnesium: 2.1 mg/dL (ref 1.5–2.5)

## 2020-03-22 ENCOUNTER — Other Ambulatory Visit: Payer: Self-pay | Admitting: Adult Health

## 2020-03-22 DIAGNOSIS — N183 Chronic kidney disease, stage 3 unspecified: Secondary | ICD-10-CM | POA: Insufficient documentation

## 2020-03-22 DIAGNOSIS — N289 Disorder of kidney and ureter, unspecified: Secondary | ICD-10-CM

## 2020-03-22 DIAGNOSIS — I130 Hypertensive heart and chronic kidney disease with heart failure and stage 1 through stage 4 chronic kidney disease, or unspecified chronic kidney disease: Secondary | ICD-10-CM | POA: Insufficient documentation

## 2020-03-22 DIAGNOSIS — N1832 Chronic kidney disease, stage 3b: Secondary | ICD-10-CM

## 2020-03-28 ENCOUNTER — Other Ambulatory Visit (HOSPITAL_COMMUNITY): Payer: Self-pay

## 2020-03-28 DIAGNOSIS — I5022 Chronic systolic (congestive) heart failure: Secondary | ICD-10-CM

## 2020-03-28 NOTE — Progress Notes (Signed)
Orders Placed This Encounter  Procedures  . ECHOCARDIOGRAM COMPLETE    Standing Status:   Future    Standing Expiration Date:   03/28/2021    Order Specific Question:   Where should this test be performed    Answer:   [facility]    Order Specific Question:   Perflutren DEFINITY (image enhancing agent) should be administered unless hypersensitivity or allergy exist    Answer:   Administer Perflutren    Order Specific Question:   Reason for exam-Echo    Answer:   Congestive Heart Failure  I50.9    Order Specific Question:   Release to patient    Answer:   Immediate

## 2020-03-30 NOTE — Progress Notes (Signed)
Patient ID: [name], female   DOB: [birth year], 60 y.o.   MRN: [id number]  ADVANCED HF CLINIC NOTE PCP:  Dr. Idell Pickles  Primary Cardiologist:  Dr. [name] Pulmonologist: Dr Gwenette Greet EP: Dr Rayann Heman  History of Present Illness:  [name] is a 60 y.o. female with a history of obesity, chronic diastolic heart failure, chronic AF, HTN, hyperlipidemia, sleep apnea, glucose intolerance, COPD and GERD.  She was admitted 6/12 for atrial flutter with RVR and volume overload. She was placed on flecainide.  She was seen by EP. Given her multiple atrial arrhythmias, ablation therapy was not felt to be helpful and she was continued on flecainide.  She had continued dyspnea and CT scan demonstrated a right lower lobe nonocclusive pulmonary embolism.  She was placed on heparin and Coumadin.    Admitted 12/13 with afib with RVR (NSR 09/23/10) and volume overload.  During hospitalization Underwent TEE/DCCV and DCCV  which showed no thrombus.  She failed 2 cardioversions therefore rate control was pursued. Flecainide was stopped and transitioned to cardizem and amiodarone. She was also placed on Xarelto. Discharge weight 215 pounds.  She presents today for HF f/u. We have not seen her since 4/19. Seen in ED on 03/05/20 for SOB and felt to have asthma flare. She was out of albuterol as well as diltiazem. Tested + COVID. Still working for [employer] from home. Feels ok. No CP. Still SOB with just mild activity. + edema. Takes lasix 80/40 or 80/80 depending on fluid. No longer smoking. Gained 20 pounds over 4 years. On Xarelto. No bleeding. Just started on Ozempic last week.   Echo today 03/31/20 EF 60-65% RV ok Personally reviewed   06/17/2014 ECHO EF 55-60% perhaps mild RV HK  05/31/16 EF 60-65% RV normal.    SH; Works full time at [employer]. Lives with her 2 sons. Separated from her husband  ROS: All pertinent positives and negatives as in HPI, otherwise negative.     Past  Medical History:  Diagnosis Date  . Anemia   . Anxiety   . Atrial flutter ([place])   . Borderline diabetes   . Chronic diastolic heart failure ([place])   . COPD (chronic obstructive pulmonary disease) ([place])   . Depression   . GERD (gastroesophageal reflux disease)   . HTN (hypertension)   . Hyperlipidemia   . Obstructive sleep apnea   . Persistent atrial fibrillation ([place])    Myoview 4/09: No ischemia;  echo 4/09: EF 60-65%;   Flecainide, beta blocker, Coumadin;    Unable to take Tikosyn 2/2 prolonged QT  . Pulmonary embolism ([facility])    Right lower lobe diagnosed by CT 6/12  . Type II or unspecified type diabetes mellitus without mention of complication, not stated as uncontrolled     Current Outpatient Medications  Medication Sig Dispense Refill  . albuterol (PROAIR HFA) 108 (90 Base) MCG/ACT inhaler Inhale 2 puffs into the lungs every 6 (six) hours as needed for wheezing or shortness of breath. 8 g 1  . albuterol (PROVENTIL) (2.5 MG/3ML) 0.083% nebulizer solution Take 3 mLs (2.5 mg total) by nebulization every 6 (six) hours as needed for wheezing or shortness of breath. 75 mL 12  . aspirin-acetaminophen-caffeine (EXCEDRIN MIGRAINE) 169-678-93 MG per tablet Take 1 tablet by mouth every 6 (six) hours as needed for headache.    . bisoprolol (ZEBETA) 5 MG tablet Take      1 tablet      Daily  for BP 90 tablet 0  . busPIRone (BUSPAR) 10 MG tablet TAKE 1/2 TO 1 TABLET 3 X /DAY FOR ANXIERTY 270 tablet 1  . chlorhexidine (PERIDEX) 0.12 % solution Use as directed 15 mLs in the mouth or throat 2 (two) times daily. 473 mL 0  . citalopram (CELEXA) 40 MG tablet Take      1 tablet      Daily      for Mood 90 tablet 0  . diltiazem (CARDIZEM CD) 360 MG 24 hr capsule TAKE 1 CAPSULE BY MOUTH EVERY DAY 90 capsule 1  . diphenhydrAMINE HCl, Sleep, 50 MG CAPS Take 1 capsule by mouth daily.    . furosemide (LASIX) 40 MG tablet TAKE 1 TABLET 2 TO 3 X /DAY AS DIRECTED FOR BP & FLUID RETENTION / ANKLE SWELLING  270 tablet 3  . gabapentin (NEURONTIN) 300 MG capsule Take 1 capsule 3 x /day for Chronic Back Pain 270 capsule 1  . ipratropium-albuterol (DUONEB) 0.5-2.5 (3) MG/3ML SOLN TAKE 3 MLS BY NEBULIZATION EVERY 4 (FOUR) HOURS AS NEEDED. MAX:6 DOSES PER DAY 540 mL 0  . metolazone (ZAROXOLYN) 2.5 MG tablet As needed for increase in swelling (Patient taking differently: Take 2.5 mg by mouth once a week. As needed for increase in swelling) 90 tablet 3  . nystatin cream (MYCOSTATIN) Apply 1 application topically 2 (two) times daily. (Patient taking differently: Apply 1 application topically 2 (two) times daily as needed for dry skin (ON FEET).) 30 g 1  . polyethylene glycol (MIRALAX / GLYCOLAX) packet Take 17 g by mouth daily as needed for moderate constipation.     . potassium chloride (KLOR-CON) 10 MEQ tablet TAKE 2 TABLETS 2 X /DAY FOR POTASSIUM & TAKE ADDITIONAL 2 TABLETS WITH ADDITIONAL DOSE OF LASIX 360 tablet 3  . pravastatin (PRAVACHOL) 40 MG tablet Take 1 tablet (40 mg total) by mouth every evening. 90 tablet 3  . rivaroxaban (XARELTO) 20 MG TABS tablet Take 1 tablet Daily to Prevent Blood Clots 90 tablet 1  . Semaglutide,0.25 or 0.5MG/DOS, (OZEMPIC, 0.25 OR 0.5 MG/DOSE,) 2 MG/1.5ML SOPN Inject 0.25 mg into skin once a week for 4 weeks, then 0.5 mg weekly. 3 mL 0  . spironolactone (ALDACTONE) 25 MG tablet TAKE 0.5 TABLETS (12.5 MG TOTAL) BY MOUTH DAILY. NEEDS APPT FOR FURTHER REFILLS 15 tablet 0  . traZODone (DESYREL) 100 MG tablet Take   1 tablet   1 hour before Bedtime   as needed for Sleep 90 tablet 0  . TRELEGY ELLIPTA 100-62.5-25 MCG/INH AEPB INHALE 1 PUFF INTO THE LUNGS ONCE A DAY 180 each 1  . valACYclovir (VALTREX) 1000 MG tablet Take 1 tablet (1,000 mg total) by mouth 3 (three) times daily. (Patient not taking: Reported on 03/19/2020) 21 tablet 0   Current Facility-Administered Medications  Medication Dose Route Frequency Provider Last Rate Last Admin  . ipratropium-albuterol (DUONEB) 0.5-2.5  (3) MG/3ML nebulizer solution 3 mL  3 mL Nebulization Once Vicie Mutters R, PA-C      . ipratropium-albuterol (DUONEB) 0.5-2.5 (3) MG/3ML nebulizer solution 3 mL  3 mL Nebulization Once Garnet Sierras, NP        Allergies: Allergies  Allergen Reactions  . Sulfonamide Derivatives Hives and Itching  . Tikosyn [Dofetilide]     Prolonged qt   Vitals:   03/31/20 0933  BP: 122/78  Pulse: 74  SpO2: 94%  Weight: 119.4 kg (263 lb 3.2 oz)   Wt Readings from Last 3 Encounters:  03/19/20 117.5  kg (259 lb)  12/04/19 118.7 kg (261 lb 9.6 oz)  12/03/19 117.9 kg (260 lb)    PHYSICAL EXAM: General:  Obese  No resp difficulty HEENT: normal Neck: supple. no JVD. Carotids 2+ bilat; no bruits. No lymphadenopathy or thryomegaly appreciated. Cor: PMI nondisplaced. Irregular rate & rhythm. No rubs, gallops or murmurs. Lungs: clear Abdomen: obese soft, nontender, nondistended. No hepatosplenomegaly. No bruits or masses. Good bowel sounds. Extremities: no cyanosis, clubbing, rash, edema Neuro: alert & orientedx3, cranial nerves grossly intact. moves all 4 extremities w/o difficulty. Affect pleasant   ASSESSMENT AND PLAN:  1) Chronic diastolic HF:  EF 92%, mod MR (01/2012) - Echo EF 60-65%. RV appears normal in 4/18 - Echo today. 03/31/20: EF 60-65% RV ok . - Stable NYHA II-III. Volume status ok  - With HFpEF, CKD and DM discussed addition of SGLT2i. She will d/w her PCP who recently added Ozempic.  2) Obesity - Reinforced need for even modest weight loss.  - Discussed [facility] Diet 3) Hyperlipidemia - Per PCP 4) Chronic Atrial Fibrillation.-- [place] Vasc Score 3.  - Good rate control. Asymptomatice - Continue bisoprolol and diltiazem  - Continue Xarelto 20 mg daily. Denies bleeding 5) DM- Continue metoformin. Per PCP.   - As above, consider SGLT2i 6) Hypothyroid- - Per PCP.  7) OSA - much improved with CPAP 8) CKD stage IIIb - Baseline creatine 1.5-2.1 - Has f/u with  Nephrology - Consider SGLT2i   [name], MD  9:47 PM

## 2020-03-31 ENCOUNTER — Encounter (HOSPITAL_COMMUNITY): Payer: Self-pay | Admitting: Internal Medicine

## 2020-03-31 ENCOUNTER — Other Ambulatory Visit: Payer: Self-pay

## 2020-03-31 ENCOUNTER — Ambulatory Visit (HOSPITAL_COMMUNITY)
Admission: RE | Admit: 2020-03-31 | Discharge: 2020-03-31 | Disposition: A | Discharge status: Home / Self Care | Payer: No Typology Code available for payment source | Source: Ambulatory Visit | Attending: Internal Medicine | Admitting: Internal Medicine

## 2020-03-31 ENCOUNTER — Ambulatory Visit (HOSPITAL_BASED_OUTPATIENT_CLINIC_OR_DEPARTMENT_OTHER)
Admission: RE | Admit: 2020-03-31 | Discharge: 2020-03-31 | Disposition: A | Discharge status: Home / Self Care | Payer: No Typology Code available for payment source | Source: Ambulatory Visit | Attending: Internal Medicine | Admitting: Internal Medicine

## 2020-03-31 VITALS — BP 122/78 | HR 74 | Wt 263.2 lb

## 2020-03-31 DIAGNOSIS — Z86711 Personal history of pulmonary embolism: Secondary | ICD-10-CM | POA: Diagnosis not present

## 2020-03-31 DIAGNOSIS — Z9989 Dependence on other enabling machines and devices: Secondary | ICD-10-CM | POA: Insufficient documentation

## 2020-03-31 DIAGNOSIS — N1832 Chronic kidney disease, stage 3b: Secondary | ICD-10-CM

## 2020-03-31 DIAGNOSIS — Z7982 Long term (current) use of aspirin: Secondary | ICD-10-CM | POA: Diagnosis not present

## 2020-03-31 DIAGNOSIS — Z87891 Personal history of nicotine dependence: Secondary | ICD-10-CM | POA: Diagnosis not present

## 2020-03-31 DIAGNOSIS — I5042 Chronic combined systolic (congestive) and diastolic (congestive) heart failure: Secondary | ICD-10-CM | POA: Diagnosis not present

## 2020-03-31 DIAGNOSIS — I5032 Chronic diastolic (congestive) heart failure: Secondary | ICD-10-CM

## 2020-03-31 DIAGNOSIS — I130 Hypertensive heart and chronic kidney disease with heart failure and stage 1 through stage 4 chronic kidney disease, or unspecified chronic kidney disease: Secondary | ICD-10-CM | POA: Diagnosis present

## 2020-03-31 DIAGNOSIS — I4821 Permanent atrial fibrillation: Secondary | ICD-10-CM

## 2020-03-31 DIAGNOSIS — E669 Obesity, unspecified: Secondary | ICD-10-CM | POA: Diagnosis not present

## 2020-03-31 DIAGNOSIS — G4733 Obstructive sleep apnea (adult) (pediatric): Secondary | ICD-10-CM | POA: Diagnosis not present

## 2020-03-31 DIAGNOSIS — E785 Hyperlipidemia, unspecified: Secondary | ICD-10-CM | POA: Diagnosis not present

## 2020-03-31 DIAGNOSIS — K219 Gastro-esophageal reflux disease without esophagitis: Secondary | ICD-10-CM | POA: Diagnosis not present

## 2020-03-31 DIAGNOSIS — Z882 Allergy status to sulfonamides status: Secondary | ICD-10-CM | POA: Insufficient documentation

## 2020-03-31 DIAGNOSIS — Z7984 Long term (current) use of oral hypoglycemic drugs: Secondary | ICD-10-CM | POA: Insufficient documentation

## 2020-03-31 DIAGNOSIS — E039 Hypothyroidism, unspecified: Secondary | ICD-10-CM | POA: Insufficient documentation

## 2020-03-31 DIAGNOSIS — E1122 Type 2 diabetes mellitus with diabetic chronic kidney disease: Secondary | ICD-10-CM | POA: Insufficient documentation

## 2020-03-31 DIAGNOSIS — Z79899 Other long term (current) drug therapy: Secondary | ICD-10-CM | POA: Insufficient documentation

## 2020-03-31 DIAGNOSIS — I482 Chronic atrial fibrillation, unspecified: Secondary | ICD-10-CM | POA: Insufficient documentation

## 2020-03-31 DIAGNOSIS — Z7901 Long term (current) use of anticoagulants: Secondary | ICD-10-CM | POA: Diagnosis not present

## 2020-03-31 DIAGNOSIS — I5022 Chronic systolic (congestive) heart failure: Secondary | ICD-10-CM | POA: Diagnosis not present

## 2020-03-31 DIAGNOSIS — I272 Pulmonary hypertension, unspecified: Secondary | ICD-10-CM | POA: Insufficient documentation

## 2020-03-31 DIAGNOSIS — Z8616 Personal history of COVID-19: Secondary | ICD-10-CM | POA: Insufficient documentation

## 2020-03-31 DIAGNOSIS — J449 Chronic obstructive pulmonary disease, unspecified: Secondary | ICD-10-CM | POA: Diagnosis not present

## 2020-03-31 HISTORY — DX: Heart failure, unspecified: I50.9

## 2020-03-31 LAB — ECHOCARDIOGRAM COMPLETE
Area-P 1/2: 3.12 cm2
S' Lateral: 3.2 cm

## 2020-03-31 MED ORDER — PERFLUTREN LIPID MICROSPHERE
1.0000 mL | INTRAVENOUS | Status: DC | PRN
  Administered 2020-03-31: 3 mL via INTRAVENOUS
  Filled 2020-03-31: qty 10

## 2020-03-31 NOTE — Progress Notes (Signed)
Echocardiogram 2D Echocardiogram has been performed.  Oneal Deputy Chanceler Pullin 03/31/2020, 9:26 AM

## 2020-03-31 NOTE — Patient Instructions (Signed)
Please call our office in August 2022 to schedule your follow up appointment  If you have any questions or concerns before your next appointment please send [place] a message through [place] or call our office at [phone].    TO LEAVE A MESSAGE FOR THE NURSE SELECT OPTION 2, PLEASE LEAVE A MESSAGE INCLUDING: . YOUR NAME . DATE OF BIRTH . CALL BACK NUMBER . REASON FOR CALL**this is important as we prioritize the call backs  YOU WILL RECEIVE A CALL BACK THE SAME DAY AS LONG AS YOU CALL BEFORE 4:00 PM

## 2020-04-15 ENCOUNTER — Other Ambulatory Visit: Payer: Self-pay

## 2020-04-15 MED ORDER — TRELEGY ELLIPTA 100-62.5-25 MCG/INH IN AEPB: INHALATION_SPRAY | RESPIRATORY_TRACT | 1 refills | Status: DC

## 2020-04-23 ENCOUNTER — Other Ambulatory Visit: Payer: Self-pay | Admitting: Adult Health Nurse Practitioner

## 2020-04-23 DIAGNOSIS — J441 Chronic obstructive pulmonary disease with (acute) exacerbation: Secondary | ICD-10-CM

## 2020-04-28 ENCOUNTER — Other Ambulatory Visit: Payer: Self-pay | Admitting: Nephrology

## 2020-04-28 DIAGNOSIS — N1832 Chronic kidney disease, stage 3b: Secondary | ICD-10-CM

## 2020-05-12 ENCOUNTER — Ambulatory Visit
Admission: RE | Admit: 2020-05-12 | Discharge: 2020-05-12 | Disposition: A | Discharge status: Home / Self Care | Payer: No Typology Code available for payment source | Source: Ambulatory Visit | Attending: Nephrology | Admitting: Nephrology

## 2020-05-12 DIAGNOSIS — N1832 Chronic kidney disease, stage 3b: Secondary | ICD-10-CM

## 2020-05-20 ENCOUNTER — Other Ambulatory Visit: Payer: Self-pay | Admitting: Internal Medicine

## 2020-05-20 DIAGNOSIS — I5032 Chronic diastolic (congestive) heart failure: Secondary | ICD-10-CM

## 2020-05-23 ENCOUNTER — Other Ambulatory Visit: Payer: Self-pay | Admitting: Internal Medicine

## 2020-05-23 DIAGNOSIS — F32A Depression, unspecified: Secondary | ICD-10-CM

## 2020-05-25 ENCOUNTER — Other Ambulatory Visit: Payer: Self-pay | Admitting: Internal Medicine

## 2020-05-25 DIAGNOSIS — I5032 Chronic diastolic (congestive) heart failure: Secondary | ICD-10-CM

## 2020-06-09 ENCOUNTER — Ambulatory Visit: Payer: No Typology Code available for payment source | Admitting: Adult Health

## 2020-07-25 ENCOUNTER — Other Ambulatory Visit: Payer: Self-pay | Admitting: Adult Health Nurse Practitioner

## 2020-07-25 ENCOUNTER — Other Ambulatory Visit: Payer: Self-pay | Admitting: Internal Medicine

## 2020-07-25 DIAGNOSIS — F32A Depression, unspecified: Secondary | ICD-10-CM

## 2020-07-31 ENCOUNTER — Other Ambulatory Visit: Payer: Self-pay | Admitting: Internal Medicine

## 2020-08-31 ENCOUNTER — Other Ambulatory Visit: Payer: Self-pay | Admitting: Internal Medicine

## 2020-09-21 ENCOUNTER — Other Ambulatory Visit: Payer: Self-pay | Admitting: Adult Health Nurse Practitioner

## 2020-10-07 ENCOUNTER — Other Ambulatory Visit: Payer: Self-pay | Admitting: Adult Health

## 2020-11-21 ENCOUNTER — Other Ambulatory Visit: Payer: Self-pay | Admitting: Internal Medicine

## 2020-11-21 ENCOUNTER — Other Ambulatory Visit: Payer: Self-pay | Admitting: Adult Health

## 2020-12-02 NOTE — Progress Notes (Deleted)
Complete Physical  Assessment and Plan:  Encounter for general adult medical examination with abnormal findings Due annually  Referred to GYN for overdue mammogram and PAP *** Work on weight loss prior to colonoscopy per patient report - will prioritize this   Chronic atrial fibrillation ([place]) Continue medications, rate controlled, on NOAC, follow up cardio  Secondary hypertension - cardiology following, continue medications, DASH diet, exercise and monitor at home. Call if greater than 130/80.   Other pulmonary embolism without acute cor pulmonale, unspecified chronicity ([place]) Stay on CPAP, continue xarelto  Atrial flutter, unspecified type ([place]) Continue medications, rate controlled, on NOAC, follow up cardio  Pulmonary hypertension due to COPD ([place]) Stay on CPAP, weight loss advised  CHF NYHA class III (symptoms with mildly strenuous activities), chronic, diastolic (HCC) Follow up with cardio, monitor weight daily, weight loss advised  OSA and COPD overlap syndrome ([place]) Stay on CPAP, weight loss advised  Chronic obstructive pulmonary disease, unspecified COPD type ([place]) Continue inhalers  Type 2 diabetes mellitus with CKD ([place]) Discussed general issues about diabetes pathophysiology and management., Educational material distributed., Suggested low cholesterol diet., Encouraged aerobic exercise., Discussed foot care., Reminded to get yearly retinal exam. -     COMPLETE METABOLIC PANEL WITH GFR -     Hemoglobin A1c  Hyperlipidemia assocaited with T2DM (HCC) check lipids decrease fatty foods increase activity.  -     Lipid panel  CKD (chronic kidney disease) stage 3b, GFR 44-30 ml/min (HCC) avoid NSAIDS, control glucose/BP, will monitor -     COMPLETE METABOLIC PANEL WITH GFR -     Urinalysis, Routine w reflex microscopic -     Microalbumin / creatinine urine ratio  Other specified hypothyroidism -check TSH level, continue medications the same, reminded to take on  an empty stomach 30-68mns before food.  -     TSH  Depression, major, recurrent, moderate (HCC)/anxiety  - continue medications, stress management techniques discussed, increase water, good sleep hygiene discussed, increase exercise, and increase veggies.   Morbid obesity ([place] - follow up 3 months for progress monitoring; she will work on increasing fruit/veggies intake - increase veggies, try to limit snacking - long discussion about weight loss, diet, and exercise - will try to improve bowel habits, then try ozempic, poor candidate for phentermine due to heart, metformin due to renal functions - given information about emotional eating  Vitamin D deficiency -     VITAMIN D 25 Hydroxy (Vit-D Deficiency, Fractures)  Gastroesophageal reflux disease, esophagitis presence not specified Continue PPI/H2 blocker, diet discussed  Noncompliance Hx of; improved; will discuss barriers regularly and individualize plan to assist with compliance  Medication management -     Magnesium  Family history of colon cancer - OVERDUE, has constipation as well, get on miralax and needs colonoscopy - She discussed with Dr. MCollene Mares concern due to cardiac history and reports was recommended weight loss prior to colonoscopy. WEIGHT LOSS HIGH PRIORITY THIS YEAR  Constipation Add miralax, increase movement during day, increase fiber intake, and maximize fluid intake as recommended by cardiology  No orders of the defined types were placed in this encounter.   Discussed med's effects and SE's. Screening labs and tests as requested with regular follow-up as recommended. Over 40 minutes of exam, counseling, chart review, and complex, high level critical decision making was performed this visit.  Future Appointments  Date Time Provider DChase 12/03/2020  9:00 AM MWard Givens NP [facility] None  12/03/2020 10:00 AM CLiane Comber NP G[facility] Quint  None    HPI  60 y.o. female  presents for a  complete physical and follow up for has Depression, major, recurrent, moderate ([place]); OSA and COPD overlap syndrome ([place]); G E R D; CHF (congestive heart failure), NYHA class III, chronic, diastolic ([place]); History of pulmonary embolus (PE); Atrial flutter (HCC); COPD (chronic obstructive pulmonary disease) ([place]); Atrial fibrillation ([place]); Hypothyroid; Hyperlipidemia associated with type 2 diabetes mellitus ([place]); HTN (hypertension); Type 2 diabetes with kidney complications ([place]); Anxiety; Vitamin D deficiency; Morbid obesity ([place]); Noncompliance; CKD stage G3b/A1, GFR 30-44 and albumin creatinine ratio <30 mg/g ([place]); Pulmonary hypertension due to COPD ([place]); Obstructive sleep apnea treated with continuous positive airway pressure (CPAP); Constipation; and Benign hypertensive heart and kidney disease with diastolic CHF, NYHA class 3 and CKD stage 3 ([place]) on their problem list.  She is separated since 2009, 4 grown children and 5 grandchildren. 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 Has noted fewer URI since working from home and happy.   She doesn't drive, family drives as needed.   Most stress is from family issues, son thinking about the [employer].  She has hx of recurrent depression, on celexa 40 mg, trazodone 50 mg, rare PRN xanax. Also taking gabapentin at night which helps.   She takes naproxen PRN rarely for aches and pains.   BMI is There is no height or weight on file to calculate BMI., she is working on diet and exercise. She reports just started working with dietician from work, doing more yogurt/fruit, eggs. Doesn't track weight.   Wt Readings from Last 3 Encounters:  03/31/20 263 lb 3.2 oz (119.4 kg)  03/19/20 259 lb (117.5 kg)  12/04/19 261 lb 9.6 oz (118.7 kg)   She has a history of afib/flutter and CHF, she is on amiodarone, cardizem, she is on xarelto, spriolactone 1/2 at night, following with Dr. Haroldine Laws.   She is on lasix 2 a day and zaroxyln 1 x a week and 6-8 potassium a  day.   She also has PHTN due to COPD and OSA, she is on CPAP, follows with neurology, endorses 100% compliance. Dr. Brett Fairy follows.   She is on trellegy ellipta for COPD, nebs PRN and reports doing well.   Her blood pressure has been controlled at home, today their BP is    She does not workout. She denies chest pain, shortness of breath, dizziness.  She is not on cholesterol medication and denies myalgias. Her cholesterol is not at goal. The cholesterol last visit was:   Lab Results  Component Value Date   CHOL 169 03/19/2020   HDL 44 (L) 03/19/2020   LDLCALC 102 (H) 03/19/2020   TRIG 133 03/19/2020   CHOLHDL 3.8 03/19/2020   She has been working on diet and exercise for lifestyle managed T2 diabetes that has fortunately remained <7%, she is on bASA, she is on ACE/ARB and denies paresthesia of the feet, polydipsia, polyuria and visual disturbances. Last A1C in the office was:  Lab Results  Component Value Date   HGBA1C 6.6 (H) 03/19/2020    She has CKD IIIb r/t htn and T2DM, *** Lab Results  Component Value Date   GFRNONAA 32 (L) 03/19/2020   GFRNONAA 34 (L) 12/03/2019   GFRNONAA 37 (L) 07/18/2019   She is on thyroid medication. Her medication was not changed last visit.   Lab Results  Component Value Date   TSH 2.97 03/19/2020   Patient is on Vitamin D supplement and now calcium pills, states  she has less back pain.   Lab Results  Component Value Date   VD25OH 24 (L) 12/03/2019         Current Medications:  Current Outpatient Medications on File Prior to Visit  Medication Sig Dispense Refill   albuterol (PROAIR HFA) 108 (90 Base) MCG/ACT inhaler Inhale 2 puffs into the lungs every 6 (six) hours as needed for wheezing or shortness of breath. 8 g 1   albuterol (PROVENTIL) (2.5 MG/3ML) 0.083% nebulizer solution Take 3 mLs (2.5 mg total) by nebulization every 6 (six) hours as needed for wheezing or shortness of breath. 75 mL 12   aspirin-acetaminophen-caffeine  (EXCEDRIN MIGRAINE) 250-250-65 MG per tablet Take 1 tablet by mouth every 6 (six) hours as needed for headache.     bisoprolol (ZEBETA) 5 MG tablet Take  1 tablet  Daily  for BP (Dx: i.10) 90 tablet 3   busPIRone (BUSPAR) 10 MG tablet TAKE 1/2 TO 1 TABLET 3 X /DAY FOR ANXIETY 270 tablet 3   citalopram (CELEXA) 40 MG tablet Take  1 tablet  Daily  for Mood 90 tablet 3   diltiazem (CARDIZEM CD) 360 MG 24 hr capsule Take 1 capsule daily for BP & Heart Rhythm 90 capsule 3   diphenhydrAMINE HCl, Sleep, 50 MG CAPS Take 1 capsule by mouth daily.     furosemide (LASIX) 40 MG tablet TAKE 1 TABLET 2 TO 3 X /DAY AS DIRECTED FOR BP & FLUID RETENTION / ANKLE SWELLING 270 tablet 3   gabapentin (NEURONTIN) 300 MG capsule Take 1 capsule 3 x /day for Chronic Back Pain 270 capsule 1   ipratropium-albuterol (DUONEB) 0.5-2.5 (3) MG/3ML SOLN USE 1 NEBULE VIA NEBULIZATION EVERY 4 HOURS AS NEEDED (MAX 6 DOSES PER DAY) 540 mL 3   metolazone (ZAROXOLYN) 2.5 MG tablet Take 2.5 mg by mouth as needed.     nystatin cream (MYCOSTATIN) Apply 1 application topically 2 (two) times daily. 30 g 1   polyethylene glycol (MIRALAX / GLYCOLAX) packet Take 17 g by mouth daily as needed for moderate constipation.      potassium chloride (KLOR-CON) 10 MEQ tablet TAKE 2 TABLETS 2 X /DAY FOR POTASSIUM & TAKE ADDITIONAL 2 TABLETS WITH ADDITIONAL DOSE OF LASIX 360 tablet 3   rivaroxaban (XARELTO) 20 MG TABS tablet Take  1 tablet  Daily  to Prevent Blood Clots from Atrial Fibrillation  (Dx:  i48.91) 90 tablet 3   rosuvastatin (CRESTOR) 10 MG tablet Take 10 mg by mouth daily.     Semaglutide,0.25 or 0.5MG/DOS, (OZEMPIC, 0.25 OR 0.5 MG/DOSE,) 2 MG/1.5ML SOPN Inject 0.25 mg into skin once a week for 4 weeks, then 0.5 mg weekly. 3 mL 0   spironolactone (ALDACTONE) 25 MG tablet TAKE 0.5 TABLETS (12.5 MG TOTAL) BY MOUTH DAILY. NEEDS APPT FOR FURTHER REFILLS 15 tablet 0   traZODone (DESYREL) 100 MG tablet Take  1 tablet  1 hour  before Bedtime  as needed  for Sleep 90 tablet 3   TRELEGY ELLIPTA 100-62.5-25 MCG/INH AEPB INHALE 1 PUFF INTO THE LUNGS ONCE A DAY 180 each 1   No current facility-administered medications on file prior to visit.   Allergies:  Allergies  Allergen Reactions   Sulfonamide Derivatives Hives and Itching   Tikosyn [Dofetilide]     Prolonged qt   Medical History:  She has Depression, major, recurrent, moderate (HCC); OSA and COPD overlap syndrome ([place]); G E R D; CHF (congestive heart failure), NYHA class III, chronic, diastolic ([place]); History of  pulmonary embolus (PE); Atrial flutter (HCC); COPD (chronic obstructive pulmonary disease) ([place]); Atrial fibrillation ([place]); Hypothyroid; Hyperlipidemia associated with type 2 diabetes mellitus ([place]); HTN (hypertension); Type 2 diabetes with kidney complications ([place]); Anxiety; Vitamin D deficiency; Morbid obesity ([place]); Noncompliance; CKD stage G3b/A1, GFR 30-44 and albumin creatinine ratio <30 mg/g ([place]); Pulmonary hypertension due to COPD ([place]); Obstructive sleep apnea treated with continuous positive airway pressure (CPAP); Constipation; and Benign hypertensive heart and kidney disease with diastolic CHF, NYHA class 3 and CKD stage 3 ([place]) on their problem list.   Health Maintenance:   Immunization History  Administered Date(s) Administered   Influenza Inj Mdck Quad With Preservative 11/30/2018, 12/03/2019   PFIZER(Purple Top)SARS-COV-2 Vaccination 12/02/2019, 12/25/2019   Td 02/08/2001   Tdap 11/07/2017    Tetanus: 2019 Pneumovax: DUE - defer, just had covid 19 *** Prevnar 13:  Flu vaccine: 2020, TODAY  Shingrix: will check with insurance  Covid 19: 1/2, 2021, pfizer  Pap: 2010 -has been declining "I'm embarrassed about my weight." after long discussion will refer to GYN for attempt there MGM: 2006 OVERDUE -poor follow through - will refer to GYN DEXA: age 9  Colonoscopy: 2003 colon cancer in her mom late 60's passed age 64 - states can not do the colonoscopy due  to health, Dr. Collene Mares discussed with Dr. Missy Sabins, medium, advised work on weight loss first   Last eye: several years, encouraged overdue diabetic eye, she declines referral for this today Last dental:   Patient Care Team: Unk Pinto, MD as PCP - General (Internal Medicine) Stanford Breed Denice Bors, MD as Consulting Physician (Cardiology) Bensimhon, Shaune Pascal, MD as Consulting Physician (Cardiology)  Surgical History:  She has a past surgical history that includes Tubal ligation (1996); TEE without cardioversion (01/19/2012); Cardioversion (01/19/2012); Cardioversion (01/21/2012); and basal skin can. Family History:  Herfamily history includes Allergies in her father; Asthma in her father; Colon cancer in her mother; Coronary artery disease in her father and mother; Depression in her daughter; Emphysema in her father; Heart attack in her maternal grandfather; Heart disease in her maternal grandmother. Social History:  She reports that she quit smoking about 11 years ago. Her smoking use included cigarettes. She has a 70.00 pack-year smoking history. She has never used smokeless tobacco. She reports that she does not drink alcohol and does not use drugs.  Review of Systems: Review of Systems  Constitutional: Negative.  Negative for malaise/fatigue and weight loss.  HENT: Negative.  Negative for hearing loss and tinnitus.   Eyes: Negative.  Negative for blurred vision and double vision.  Respiratory: Negative.  Negative for cough, sputum production, shortness of breath and wheezing.   Cardiovascular: Negative.  Negative for chest pain, palpitations, orthopnea, claudication, leg swelling and PND.  Gastrointestinal: Negative.  Negative for abdominal pain, blood in stool, constipation, diarrhea, heartburn, melena, nausea and vomiting.  Genitourinary: Negative.   Musculoskeletal:  Positive for joint pain (intermittent hips, back). Negative for falls and myalgias.  Skin: Negative.  Negative for  rash.  Neurological:  Negative for dizziness, tingling, sensory change, weakness and headaches.  Endo/Heme/Allergies:  Negative for polydipsia.  Psychiatric/Behavioral:  Positive for depression. Negative for memory loss, substance abuse and suicidal ideas. The patient is not nervous/anxious and does not have insomnia.   All other systems reviewed and are negative.  Physical Exam: Estimated body mass index is 54.07 kg/m as calculated from the following:   Height as of 12/04/19: 4' 10.5" (1.486 m).   Weight as of 03/31/20: 263 lb 3.2  oz (119.4 kg).  There were no vitals taken for this visit. General Appearance: Well dressed, moribdly obese, in no apparent distress. Eyes: PERRLA, EOMs, conjunctiva no swelling or erythema Sinuses: No Frontal/maxillary tenderness ENT/Mouth: Ext aud canals clear, TMs without erythema, bulging. No erythema, swelling, or exudate on post pharynx.Tonsils not swollen or erythematous. Hearing normal. Crowded mouth Neck: Supple, thyroid normal.  Respiratory: Decreased breath sounds bilateral, with wheezing and decreased breath sounds bilateral lower lobes Cardio: Irreg, irreg,  minimal edema Abdomen: Soft, + BS, Morbidly obese,  Non tender, no guarding, rebound, no palpable hernias, masses. Lymphatics: Non tender without lymphadenopathy.  Musculoskeletal: Body habitus limits exam, symmetrical ROM, 5/5 strength, antalgic gait with cane Skin: warm, dry intact, without notable rash, lesions, ecchymosis. Bilateral feet thickened long toenail no ulcers. Has very thick callouses bil Neuro: Cranial nerves intact. No cerebellar symptoms. Bil feet monofilament intact 10/10 but diminished throughout  Psych: Awake and oriented X 3,  Intermittently tearful/depressed affect, Insight and Judgment appropriate.  Breasts: defer to GYN, referral placed GU: body habitus limits - refer to GYN  EKG: A. Fib, no ST changes  Izora Ribas 8:51 AM [facility]

## 2020-12-02 NOTE — Progress Notes (Deleted)
PATIENT: [name] DOB: [birth year]  REASON FOR VISIT: follow up HISTORY FROM: patient  HISTORY OF PRESENT ILLNESS: Today 12/02/20:    12/04/19: [name] is a 60 year old female with a history of obstructive sleep apnea on CPAP.  Her download indicates that she use her machine nightly for compliance of 100%.  She use her machine greater than 4 hours each night.  On average she uses her machine 10 hours and 1 minute.  Her residual AHI is 0.9 on 12 cm of water with EPR 3.  Leak in the 95th percentile is 14.2 L/min.  She reports that the CPAP is working well for her.  She is interested in losing weight however reports that she has a sedentary job and has not been watching her diet.  HISTORY (Copied from Dr.Dohmeier's note) I have the pleasure to see here on 27 November 2018 [name] again,  a  60 year old Caucasian female patient is a history of obstructive sleep apnea using CPAP.  She also had a bout of atrial fibrillation in October 2018 she was admitted to the hospital and has been on Xarelto for anticoagulation.  She further takes gabapentin Zaroxolyn, Xanax as needed.Zebeta, albuterol inhaler as needed ipratropium albuterol as needed in case her  COPD exacerbates.   The patient has been 100% compliant with her CPAP also she confessed it took her several weeks to like it.  She now feels dependent on it she does not get good sleep without it.  Her average usage time is 10 hours 19 minutes percent pressure is still 12 cmH2O with 3 cm EPR AHI is 1.00.7 of these apneas being obstructive in origin.  She still sometimes snores or snorts 3 the CPAP which is indicated in the rare index 1.4 she does have minimal air leakage.  Based on these findings I did not need to change any of her settings but I like to talk to her about her diet and how to restrict carbohydrates and preprocessed foods.D  REVIEW OF SYSTEMS: Out of a complete 14 system review of symptoms, the patient complains  only of the following symptoms, and all other reviewed systems are negative.  FSS 59 ESS 0   ALLERGIES: Allergies  Allergen Reactions   Sulfonamide Derivatives Hives and Itching   Tikosyn [Dofetilide]     Prolonged qt    HOME MEDICATIONS: Outpatient Medications Prior to Visit  Medication Sig Dispense Refill   albuterol (PROAIR HFA) 108 (90 Base) MCG/ACT inhaler Inhale 2 puffs into the lungs every 6 (six) hours as needed for wheezing or shortness of breath. 8 g 1   albuterol (PROVENTIL) (2.5 MG/3ML) 0.083% nebulizer solution Take 3 mLs (2.5 mg total) by nebulization every 6 (six) hours as needed for wheezing or shortness of breath. 75 mL 12   aspirin-acetaminophen-caffeine (EXCEDRIN MIGRAINE) 250-250-65 MG per tablet Take 1 tablet by mouth every 6 (six) hours as needed for headache.     bisoprolol (ZEBETA) 5 MG tablet Take  1 tablet  Daily  for BP (Dx: i.10) 90 tablet 3   busPIRone (BUSPAR) 10 MG tablet TAKE 1/2 TO 1 TABLET 3 X /DAY FOR ANXIETY 270 tablet 3   citalopram (CELEXA) 40 MG tablet Take  1 tablet  Daily  for Mood 90 tablet 3   diltiazem (CARDIZEM CD) 360 MG 24 hr capsule Take 1 capsule daily for BP & Heart Rhythm 90 capsule 3   diphenhydrAMINE HCl, Sleep, 50 MG CAPS Take 1 capsule by  mouth daily.     furosemide (LASIX) 40 MG tablet TAKE 1 TABLET 2 TO 3 X /DAY AS DIRECTED FOR BP & FLUID RETENTION / ANKLE SWELLING 270 tablet 3   gabapentin (NEURONTIN) 300 MG capsule Take 1 capsule 3 x /day for Chronic Back Pain 270 capsule 1   ipratropium-albuterol (DUONEB) 0.5-2.5 (3) MG/3ML SOLN USE 1 NEBULE VIA NEBULIZATION EVERY 4 HOURS AS NEEDED (MAX 6 DOSES PER DAY) 540 mL 3   metolazone (ZAROXOLYN) 2.5 MG tablet Take 2.5 mg by mouth as needed.     nystatin cream (MYCOSTATIN) Apply 1 application topically 2 (two) times daily. 30 g 1   polyethylene glycol (MIRALAX / GLYCOLAX) packet Take 17 g by mouth daily as needed for moderate constipation.      potassium chloride (KLOR-CON) 10 MEQ  tablet TAKE 2 TABLETS 2 X /DAY FOR POTASSIUM & TAKE ADDITIONAL 2 TABLETS WITH ADDITIONAL DOSE OF LASIX 360 tablet 3   rivaroxaban (XARELTO) 20 MG TABS tablet Take  1 tablet  Daily  to Prevent Blood Clots from Atrial Fibrillation  (Dx:  i48.91) 90 tablet 3   rosuvastatin (CRESTOR) 10 MG tablet Take 10 mg by mouth daily.     Semaglutide,0.25 or 0.5MG/DOS, (OZEMPIC, 0.25 OR 0.5 MG/DOSE,) 2 MG/1.5ML SOPN Inject 0.25 mg into skin once a week for 4 weeks, then 0.5 mg weekly. 3 mL 0   spironolactone (ALDACTONE) 25 MG tablet TAKE 0.5 TABLETS (12.5 MG TOTAL) BY MOUTH DAILY. NEEDS APPT FOR FURTHER REFILLS 15 tablet 0   traZODone (DESYREL) 100 MG tablet Take  1 tablet  1 hour  before Bedtime  as needed for Sleep 90 tablet 3   TRELEGY ELLIPTA 100-62.5-25 MCG/INH AEPB INHALE 1 PUFF INTO THE LUNGS ONCE A DAY 180 each 1   No facility-administered medications prior to visit.    PAST MEDICAL HISTORY: Past Medical History:  Diagnosis Date   Anemia    Anxiety    Atrial flutter (HCC)    Borderline diabetes    CHF (congestive heart failure) (HCC)    Chronic diastolic heart failure (HCC)    COPD (chronic obstructive pulmonary disease) (HCC)    Depression    GERD (gastroesophageal reflux disease)    HTN (hypertension)    Hyperlipidemia    Obstructive sleep apnea    Persistent atrial fibrillation ([place])    Myoview 4/09: No ischemia;  echo 4/09: EF 60-65%;   Flecainide, beta blocker, Coumadin;    Unable to take Tikosyn 2/2 prolonged QT   Pulmonary embolism ([place])    Right lower lobe diagnosed by CT 6/12   Type II or unspecified type diabetes mellitus without mention of complication, not stated as uncontrolled     PAST SURGICAL HISTORY: Past Surgical History:  Procedure Laterality Date   basal skin can     CARDIOVERSION  01/19/2012   Procedure: CARDIOVERSION;  Surgeon: Jolaine Artist, MD;  Location: [facility] ENDOSCOPY;  Service: Cardiovascular;  Laterality: N/A;   CARDIOVERSION  01/21/2012   Procedure:  CARDIOVERSION;  Surgeon: Carlena Bjornstad, MD;  Location: [facility] ENDOSCOPY;  Service: Cardiovascular;  Laterality: N/A;  Rm 4733   TEE WITHOUT CARDIOVERSION  01/19/2012   Procedure: TRANSESOPHAGEAL ECHOCARDIOGRAM (TEE);  Surgeon: Jolaine Artist, MD;  Location: [facility] ENDOSCOPY;  Service: Cardiovascular;  Laterality: N/A;   TUBAL LIGATION  1996    FAMILY HISTORY: Family History  Problem Relation Age of Onset   Colon cancer Mother    Coronary artery disease Mother    Coronary  artery disease Father    Asthma Father    Emphysema Father    Allergies Father    Heart attack Maternal Grandfather    Heart disease Maternal Grandmother    Depression Daughter     SOCIAL HISTORY: Social History   Socioeconomic History   Marital status: Legally Separated    Spouse name: n/a   Number of children: 4   Years of education: 12   Highest education level: Not on file  Occupational History   Occupation: clerical    Comment: Manufacturing engineer  Tobacco Use   Smoking status: Former    Packs/day: 2.00    Years: 35.00    Pack years: 70.00    Types: Cigarettes    Quit date: 09/08/2009    Years since quitting: 11.2   Smokeless tobacco: Never  Vaping Use   Vaping Use: Never used  Substance and Sexual Activity   Alcohol use: No   Drug use: No   Sexual activity: Never    Birth control/protection: None  Other Topics Concern   Not on file  Social History Narrative   Married but currently separated with 4 children, 2 of whom still live with her.  Youngest son is a Equities trader in [employer], to graduate in 07/2012.   Social Determinants of Health   Financial Resource Strain: Not on file  Food Insecurity: Not on file  Transportation Needs: Not on file  Physical Activity: Not on file  Stress: Not on file  Social Connections: Not on file  Intimate Partner Violence: Not on file      PHYSICAL EXAM  There were no vitals filed for this visit.  There is no height or weight on file to calculate  BMI.  Generalized: Well developed, in no acute distress  Chest: Lungs clear to auscultation bilaterally  Neurological examination  Mentation: Alert oriented to time, place, history taking. Follows all commands speech and language fluent Cranial nerve II-XII: Extraocular movements were full, visual field were full on confrontational test Head turning and shoulder shrug  were normal and symmetric. Motor: The motor testing reveals 5 over 5 strength of all 4 extremities. Good symmetric motor tone is noted throughout.  Sensory: Sensory testing is intact to soft touch on all 4 extremities. No evidence of extinction is noted.  Gait and station: Gait is normal.    DIAGNOSTIC DATA (LABS, IMAGING, TESTING) - I reviewed patient records, labs, notes, testing and imaging myself where available.  Lab Results  Component Value Date   WBC 7.6 03/19/2020   HGB 14.5 03/19/2020   HCT 44.2 03/19/2020   MCV 86.3 03/19/2020   PLT 251 03/19/2020      Component Value Date/Time   NA 144 03/19/2020 0941   K 3.8 03/19/2020 0941   CL 103 03/19/2020 0941   CO2 29 03/19/2020 0941   GLUCOSE 113 (H) 03/19/2020 0941   BUN 21 03/19/2020 0941   CREATININE 1.68 (H) 03/19/2020 0941   CALCIUM 9.6 03/19/2020 0941   PROT 6.8 03/19/2020 0941   ALBUMIN 4.1 09/21/2016 1728   AST 18 03/19/2020 0941   ALT 25 03/19/2020 0941   ALKPHOS 85 09/21/2016 1728   BILITOT 0.7 03/19/2020 0941   GFRNONAA 32 (L) 03/19/2020 0941   GFRAA 38 (L) 03/19/2020 0941   Lab Results  Component Value Date   CHOL 169 03/19/2020   HDL 44 (L) 03/19/2020   LDLCALC 102 (H) 03/19/2020   TRIG 133 03/19/2020   CHOLHDL 3.8 03/19/2020   Lab Results  Component Value Date   HGBA1C 6.6 (H) 03/19/2020   Lab Results  Component Value Date   [id number] 868 11/30/2018   Lab Results  Component Value Date   TSH 2.97 03/19/2020      ASSESSMENT AND PLAN 60 y.o. year old female  has a past medical history of Anemia, Anxiety, Atrial flutter  ([place]), Borderline diabetes, CHF (congestive heart failure) (HCC), Chronic diastolic heart failure ([place]), COPD (chronic obstructive pulmonary disease) ([place]), Depression, GERD (gastroesophageal reflux disease), HTN (hypertension), Hyperlipidemia, Obstructive sleep apnea, Persistent atrial fibrillation ([place]), Pulmonary embolism ([place]), and Type II or unspecified type diabetes mellitus without mention of complication, not stated as uncontrolled. here with:  OSA on CPAP  - CPAP compliance excellent - Good treatment of AHI  - Encourage patient to use CPAP nightly and > 4 hours each night - F/U in 1 year or sooner if needed   I spent 25 minutes of face-to-face and non-face-to-face time with patient.  This included previsit chart review, lab review, study review, order entry, electronic health record documentation, patient education.  Ward Givens, MSN, NP-C 12/02/2020, 4:47 PM [facility] [address] [place], [place] [address] [phone]

## 2020-12-03 ENCOUNTER — Telehealth: Payer: No Typology Code available for payment source | Admitting: Adult Health

## 2020-12-03 ENCOUNTER — Encounter: Payer: No Typology Code available for payment source | Admitting: Adult Health

## 2021-01-12 NOTE — Progress Notes (Signed)
PATIENT: [name] DOB: [birth year]  REASON FOR VISIT: follow up HISTORY FROM: patient PRIMARY NEUROLOGIST:   Virtual Visit via Video Note  I connected with [name] on 01/13/21 at  3:00 PM EST by a video enabled telemedicine application located remotely at [facility] and verified that I am speaking with the correct person using two identifiers who was located at their own home.   I discussed the limitations of evaluation and management by telemedicine and the availability of in person appointments. The patient expressed understanding and agreed to proceed.   PATIENT: [name] DOB: September 23, 1960  REASON FOR VISIT: follow up HISTORY FROM: patient  HISTORY OF PRESENT ILLNESS: Today 01/13/21:  [name] is a 60 year old female with a history of obstructive sleep apnea on CPAP.  She reports that the CPAP is working well for her.  She denies any new issues.  Her download is below.    HISTORY 12/04/19:   [name] is a 60 year old female with a history of obstructive sleep apnea on CPAP.  Her download indicates that she use her machine nightly for compliance of 100%.  She use her machine greater than 4 hours each night.  On average she uses her machine 10 hours and 1 minute.  Her residual AHI is 0.9 on 12 cm of water with EPR 3.  Leak in the 95th percentile is 14.2 L/min.  She reports that the CPAP is working well for her.  She is interested in losing weight however reports that she has a sedentary job and has not been watching her diet.  REVIEW OF SYSTEMS: Out of a complete 14 system review of symptoms, the patient complains only of the following symptoms, and all other reviewed systems are negative.  ALLERGIES: Allergies  Allergen Reactions   Sulfonamide Derivatives Hives and Itching   Tikosyn [Dofetilide]     Prolonged qt    HOME MEDICATIONS: Outpatient Medications Prior to Visit  Medication Sig Dispense Refill   albuterol (PROAIR  HFA) 108 (90 Base) MCG/ACT inhaler Inhale 2 puffs into the lungs every 6 (six) hours as needed for wheezing or shortness of breath. 8 g 1   albuterol (PROVENTIL) (2.5 MG/3ML) 0.083% nebulizer solution Take 3 mLs (2.5 mg total) by nebulization every 6 (six) hours as needed for wheezing or shortness of breath. 75 mL 12   aspirin-acetaminophen-caffeine (EXCEDRIN MIGRAINE) 250-250-65 MG per tablet Take 1 tablet by mouth every 6 (six) hours as needed for headache.     bisoprolol (ZEBETA) 5 MG tablet Take  1 tablet  Daily  for BP (Dx: i.10) 90 tablet 3   busPIRone (BUSPAR) 10 MG tablet TAKE 1/2 TO 1 TABLET 3 X /DAY FOR ANXIETY 270 tablet 3   citalopram (CELEXA) 40 MG tablet Take  1 tablet  Daily  for Mood 90 tablet 3   diltiazem (CARDIZEM CD) 360 MG 24 hr capsule Take 1 capsule daily for BP & Heart Rhythm 90 capsule 3   diphenhydrAMINE HCl, Sleep, 50 MG CAPS Take 1 capsule by mouth daily.     furosemide (LASIX) 40 MG tablet TAKE 1 TABLET 2 TO 3 X /DAY AS DIRECTED FOR BP & FLUID RETENTION / ANKLE SWELLING 270 tablet 3   gabapentin (NEURONTIN) 300 MG capsule Take 1 capsule 3 x /day for Chronic Back Pain 270 capsule 1   ipratropium-albuterol (DUONEB) 0.5-2.5 (3) MG/3ML SOLN USE 1 NEBULE VIA NEBULIZATION EVERY 4 HOURS AS NEEDED (MAX 6 DOSES PER DAY)  540 mL 3   metolazone (ZAROXOLYN) 2.5 MG tablet Take 2.5 mg by mouth as needed.     nystatin cream (MYCOSTATIN) Apply 1 application topically 2 (two) times daily. 30 g 1   polyethylene glycol (MIRALAX / GLYCOLAX) packet Take 17 g by mouth daily as needed for moderate constipation.      potassium chloride (KLOR-CON) 10 MEQ tablet TAKE 2 TABLETS 2 X /DAY FOR POTASSIUM & TAKE ADDITIONAL 2 TABLETS WITH ADDITIONAL DOSE OF LASIX 360 tablet 3   rivaroxaban (XARELTO) 20 MG TABS tablet Take  1 tablet  Daily  to Prevent Blood Clots from Atrial Fibrillation  (Dx:  i48.91) 90 tablet 3   rosuvastatin (CRESTOR) 10 MG tablet Take 10 mg by mouth daily.     Semaglutide,0.25 or  0.5MG/DOS, (OZEMPIC, 0.25 OR 0.5 MG/DOSE,) 2 MG/1.5ML SOPN Inject 0.25 mg into skin once a week for 4 weeks, then 0.5 mg weekly. 3 mL 0   spironolactone (ALDACTONE) 25 MG tablet TAKE 0.5 TABLETS (12.5 MG TOTAL) BY MOUTH DAILY. NEEDS APPT FOR FURTHER REFILLS 15 tablet 0   traZODone (DESYREL) 100 MG tablet Take  1 tablet  1 hour  before Bedtime  as needed for Sleep 90 tablet 3   TRELEGY ELLIPTA 100-62.5-25 MCG/INH AEPB INHALE 1 PUFF INTO THE LUNGS ONCE A DAY 180 each 1   No facility-administered medications prior to visit.    PAST MEDICAL HISTORY: Past Medical History:  Diagnosis Date   Anemia    Anxiety    Atrial flutter (HCC)    Borderline diabetes    CHF (congestive heart failure) (HCC)    Chronic diastolic heart failure (HCC)    COPD (chronic obstructive pulmonary disease) (HCC)    Depression    GERD (gastroesophageal reflux disease)    HTN (hypertension)    Hyperlipidemia    Obstructive sleep apnea    Persistent atrial fibrillation ([place])    Myoview 4/09: No ischemia;  echo 4/09: EF 60-65%;   Flecainide, beta blocker, Coumadin;    Unable to take Tikosyn 2/2 prolonged QT   Pulmonary embolism ([place])    Right lower lobe diagnosed by CT 6/12   Type II or unspecified type diabetes mellitus without mention of complication, not stated as uncontrolled     PAST SURGICAL HISTORY: Past Surgical History:  Procedure Laterality Date   basal skin can     CARDIOVERSION  01/19/2012   Procedure: CARDIOVERSION;  Surgeon: Jolaine Artist, MD;  Location[facility] ENDOSCOPY;  Service: Cardiovascular;  Laterality: N/A;   CARDIOVERSION  01/21/2012   Procedure: CARDIOVERSION;  Surgeon: Carlena Bjornstad, MD;  Location: [facility] ENDOSCOPY;  Service: Cardiovascular;  Laterality: N/A;  Rm 4733   TEE WITHOUT CARDIOVERSION  01/19/2012   Procedure: TRANSESOPHAGEAL ECHOCARDIOGRAM (TEE);  Surgeon: Jolaine Artist, MD;  Location: [facility] ENDOSCOPY;  Service: Cardiovascular;  Laterality: N/A;   TUBAL LIGATION  1996     FAMILY HISTORY: Family History  Problem Relation Age of Onset   Colon cancer Mother    Coronary artery disease Mother    Coronary artery disease Father    Asthma Father    Emphysema Father    Allergies Father    Heart attack Maternal Grandfather    Heart disease Maternal Grandmother    Depression Daughter     SOCIAL HISTORY: Social History   Socioeconomic History   Marital status: Legally Separated    Spouse name: n/a   Number of children: 4   Years of education: 31  Highest education level: Not on file  Occupational History   Occupation: clerical    Comment: Manufacturing engineer  Tobacco Use   Smoking status: Former    Packs/day: 2.00    Years: 35.00    Pack years: 70.00    Types: Cigarettes    Quit date: 09/08/2009    Years since quitting: 11.3   Smokeless tobacco: Never  Vaping Use   Vaping Use: Never used  Substance and Sexual Activity   Alcohol use: No   Drug use: No   Sexual activity: Never    Birth control/protection: None  Other Topics Concern   Not on file  Social History Narrative   Married but currently separated with 4 children, 2 of whom still live with her.  Youngest son is a Equities trader in [employer], to graduate in 07/2012.   Social Determinants of Health   Financial Resource Strain: Not on file  Food Insecurity: Not on file  Transportation Needs: Not on file  Physical Activity: Not on file  Stress: Not on file  Social Connections: Not on file  Intimate Partner Violence: Not on file      PHYSICAL EXAM Generalized: Well developed, in no acute distress   Neurological examination  Mentation: Alert oriented to time, place, history taking. Follows all commands speech and language fluent   DIAGNOSTIC DATA (LABS, IMAGING, TESTING) - I reviewed patient records, labs, notes, testing and imaging myself where available.  Lab Results  Component Value Date   WBC 7.6 03/19/2020   HGB 14.5 03/19/2020   HCT 44.2 03/19/2020   MCV 86.3 03/19/2020   PLT  251 03/19/2020      Component Value Date/Time   NA 144 03/19/2020 0941   K 3.8 03/19/2020 0941   CL 103 03/19/2020 0941   CO2 29 03/19/2020 0941   GLUCOSE 113 (H) 03/19/2020 0941   BUN 21 03/19/2020 0941   CREATININE 1.68 (H) 03/19/2020 0941   CALCIUM 9.6 03/19/2020 0941   PROT 6.8 03/19/2020 0941   ALBUMIN 4.1 09/21/2016 1728   AST 18 03/19/2020 0941   ALT 25 03/19/2020 0941   ALKPHOS 85 09/21/2016 1728   BILITOT 0.7 03/19/2020 0941   GFRNONAA 32 (L) 03/19/2020 0941   GFRAA 38 (L) 03/19/2020 0941   Lab Results  Component Value Date   CHOL 169 03/19/2020   HDL 44 (L) 03/19/2020   LDLCALC 102 (H) 03/19/2020   TRIG 133 03/19/2020   CHOLHDL 3.8 03/19/2020   Lab Results  Component Value Date   HGBA1C 6.6 (H) 03/19/2020   Lab Results  Component Value Date   [id number] 983 11/30/2018   Lab Results  Component Value Date   TSH 2.97 03/19/2020      ASSESSMENT AND PLAN 60 y.o. year old female  has a past medical history of Anemia, Anxiety, Atrial flutter ([place]), Borderline diabetes, CHF (congestive heart failure) ([place]), Chronic diastolic heart failure ([place]), COPD (chronic obstructive pulmonary disease) ([place]), Depression, GERD (gastroesophageal reflux disease), HTN (hypertension), Hyperlipidemia, Obstructive sleep apnea, Persistent atrial fibrillation ([place]), Pulmonary embolism ([place]), and Type II or unspecified type diabetes mellitus without mention of complication, not stated as uncontrolled. here with:  OSA on CPAP  CPAP compliance excellent Residual AHI is good Encouraged patient to continue using CPAP nightly and > 4 hours each night F/U in 1 year or sooner if needed    Ward Givens, MSN, NP-C 01/13/2021, 7:57 AM [facility] [address], [place], [place] [address] [phone]

## 2021-01-12 NOTE — Progress Notes (Addendum)
Complete Physical  Assessment and Plan: Jaloni was seen today for annual exam.  Diagnoses and all orders for this visit:  Encounter for general adult medical examination with abnormal findings Due Annually  Persistent atrial fibrillation ([place]  Continue medications, rate controlled, on NOAC, follow up cardio -     EKG 12-Lead -     rivaroxaban (XARELTO) 20 MG TABS tablet; Take  1 tablet  Daily  to Prevent Blood Clots from Atrial Fibrillation  (Dx:  i48.91)  History of pulmonary embolus (PE)  Continue Xarelto  Chronic obstructive pulmonary disease, unspecified COPD type (HCC) -     albuterol (PROAIR HFA) 108 (90 Base) MCG/ACT inhaler; Inhale 2 puffs into the lungs every 6 (six) hours as needed for wheezing or shortness of breath. -     Fluticasone-Umeclidin-Vilant (TRELEGY ELLIPTA) 100-62.5-25 MCG/ACT AEPB; Inhale 100 mcg into the lungs daily. Continue inhalers  Atrial flutter, unspecified type ([place]) Continue to follow with cardiology and Xarelto  OSA and COPD overlap syndrome ([place]) Stay on CPAP, weight loss advised  Type 2 diabetes mellitus with stage 3b chronic kidney disease, without long-term current use of insulin ([place]) Discussed general issues about diabetes pathophysiology and management., Educational material distributed., Suggested low cholesterol diet., Encouraged aerobic exercise., Discussed foot care., Reminded to get yearly retinal exam. -     COMPLETE METABOLIC PANEL WITH GFR -     Hemoglobin A1c -     Urinalysis, Routine w reflex microscopic -     Microalbumin / creatinine urine ratio  Hyperlipidemia associated with type 2 diabetes mellitus (HCC) check lipids decrease fatty foods increase activity.  -     COMPLETE METABOLIC PANEL WITH GFR -     Lipid panel -     rosuvastatin (CRESTOR) 10 MG tablet; Take 1 tablet (10 mg total) by mouth daily.  CHF (congestive heart failure), NYHA class III, chronic, diastolic (HCC) Follow up with cardio, monitor weight daily,  weight loss advised -     rivaroxaban (XARELTO) 20 MG TABS tablet; Take  1 tablet  Daily  to Prevent Blood Clots from Atrial Fibrillation  (Dx:  i48.91) -     bisoprolol (ZEBETA) 5 MG tablet; Take  1 tablet  Daily  for BP (Dx: i.10)  Secondary hypertension -     CBC with Differential/Platelet -     Urinalysis, Routine w reflex microscopic -     Microalbumin / creatinine urine ratio -     potassium chloride (KLOR-CON) 10 MEQ tablet; TAKE 2 TABLETS 2 X /DAY FOR POTASSIUM & TAKE ADDITIONAL 2 TABLETS WITH ADDITIONAL DOSE OF LASIX -     bisoprolol (ZEBETA) 5 MG tablet; Take  1 tablet  Daily  for BP (Dx: i.10) -     diltiazem (CARDIZEM CD) 360 MG 24 hr capsule; Take 1 capsule daily for BP & Heart Rhythm - cardiology following, continue medications, DASH diet, exercise and monitor at home. Call if greater than 130/80.    Morbid obesity ([place]) - follow up 3 months for progress monitoring; she will work on increasing fruit/veggies intake - increase veggies, try to limit snacking - long discussion about weight loss, diet, and exercise - will try to improve bowel habits, then try ozempic, poor candidate for phentermine due to heart, metformin due to renal functions - given information about emotional eating  Gastroesophageal reflux disease, unspecified whether esophagitis present Continue PPI/H2 blocker, diet discussed -     Magnesium  Other specified hypothyroidism -check TSH level, continue medications the  same, reminded to take on an empty stomach 30-80mns before food.  -     TSH  Depression, major, recurrent, moderate (HCC) -     venlafaxine XR (EFFEXOR XR) 75 MG 24 hr capsule; Take 1 capsule (75 mg total) by mouth daily. -     traZODone (DESYREL) 100 MG tablet; Take  1 tablet  1 hour  before Bedtime  as needed for Sleep Change to new medication and avoid stopping medication , stress management techniques discussed, increase water, good sleep hygiene discussed, increase exercise, and  increase veggies.   Vitamin D deficiency -     VITAMIN D 25 Hydroxy (Vit-D Deficiency, Fractures) Currently not taking supplementation  Medication management Magnesium  Colon cancer screening -     Cologuard, unable to get colonoscopy currently due to body habitus  Breast Cancer Screening Screening mammogram ordered  Chronic bilateral low back pain without sciatica -     gabapentin (NEURONTIN) 300 MG capsule; Take 1 capsule 3 x /day for Chronic Back Pain  Noncompliance Hx of; improved; will discuss barriers regularly and individualize plan to assist with compliance   Family history of colon cancer - OVERDUE, has constipation as well, get on miralax and needs colonoscopy - She discussed with Dr. MCollene Mares concern due to cardiac history and reports was recommended weight loss prior to colonoscopy. WEIGHT LOSS HIGH PRIORITY THIS YEAR Will order Cologuard as did not lose weight for colonoscopy    Orders Placed This Encounter  Procedures   MM Digital Screening   Pneumococcal conjugate vaccine 20-valent (Prevnar 20)   CBC with Differential/Platelet   COMPLETE METABOLIC PANEL WITH GFR   Magnesium   Lipid panel   TSH   Hemoglobin A1c   VITAMIN D 25 Hydroxy (Vit-D Deficiency, Fractures)   Urinalysis, Routine w reflex microscopic   Microalbumin / creatinine urine ratio   Cologuard   EKG 12-Lead     Discussed med's effects and SE's. Screening labs and tests as requested with regular follow-up as recommended. Over 40 minutes of exam, counseling, chart review, and complex, high level critical decision making was performed this visit.  Future Appointments  Date Time Provider DOxbow 02/18/2021  8:20 AM GI-[facility] MM 3 GI-BCGMM GI-BREAST CE  05/08/2021  9:30 AM MMagda Bernheim NP GAAM-GAAIM None  01/15/2022  9:00 AM MMagda Bernheim NP [facility]AM-[facility] None  01/19/2022  1:45 PM MWard Givens NP GNA-[facility]NA None    HPI  60y.o. female  presents for a complete physical and follow up for  has Depression, major, recurrent, moderate ([place]; OSA and COPD overlap syndrome ([place]; G E R D; CHF (congestive heart failure), NYHA class III, chronic, diastolic ([place]; History of pulmonary embolus (PE); Atrial flutter (HCC); COPD (chronic obstructive pulmonary disease) ([place]; Atrial fibrillation ([place]; Hypothyroid; Hyperlipidemia associated with type 2 diabetes mellitus ([place]; HTN (hypertension); Type 2 diabetes with kidney complications ([place]; Anxiety; Vitamin D deficiency; Morbid obesity ([place]; Noncompliance; CKD stage G3b/A1, GFR 30-44 and albumin creatinine ratio <30 mg/g (HE[place]; Pulmonary hypertension due to COPD ([place]; Obstructive sleep apnea treated with continuous positive airway pressure (CPAP); Constipation; and Benign hypertensive heart and kidney disease with diastolic CHF, NYHA class 3 and CKD stage 3 ([place] on their problem list.  Youngest son moved out in 03/2020, joined the [employer] [place] 09[place]2020 [place]  [place] came [place] for 10 days and [place]  2 Kids don't talk to her.  Work stress, working from  home.  Crying in the office today over son leaving to [place]. More difficulty concentrating. She has been having crying episodes off and on in the past 6 months.  Celexa and Buspar are not controlling depression. Denies suicidal and homicidal ideation.   She doesn't drive, family drives as needed. Separated from husband since 2009.  Most stress is from family issues, son has joined the [employer] She has hx of recurrent depression, on celexa 40 mg, trazodone 50 mg, BUspar Also taking gabapentin at night which helps but ran out several weeks ago. Symptoms are not currently controlled- has delayed speech, stuttering when getting overwhelmed.   She takes naproxen PRN rarely for aches and pains.   BMI is Body mass index is 54.32 kg/m., she is working on diet and exercise. She reports just started working with dietician from work, doing more yogurt/fruit, eggs.  Doesn't track weight.   Wt Readings from Last 3 Encounters:  01/15/21 264 lb 6.4 oz (119.9 kg)  03/31/20 263 lb 3.2 oz (119.4 kg)  03/19/20 259 lb (117.5 kg)   She has a history of afib/flutter and CHF, she is on amiodarone, cardizem, she is on xarelto, spriolactone 1/2 at night, following with Dr. Haroldine Laws.   She is on lasix 2 a day and zaroxyln 1 x a week and 6-8 potassium a day.   She also has PHTN due to COPD and OSA, she is on CPAP, follows with neurology, endorses 100% compliance. Has follow up tomorrow with Dr. Brett Fairy.   She is on trellegy ellipta for COPD, nebs PRN and reports doing well.   Her blood pressure has been controlled at home, today their BP is BP: 112/74 BP Readings from Last 3 Encounters:  01/15/21 112/74  03/31/20 122/78  03/19/20 114/78     She does not workout. She denies chest pain, shortness of breath, dizziness.  She is not on cholesterol medication, tried Rosuvastatin not taking now and can not remember if she any reactions to the medication. Her cholesterol is not at goal. The cholesterol last visit was:   Lab Results  Component Value Date   CHOL 169 03/19/2020   HDL 44 (L) 03/19/2020   LDLCALC 102 (H) 03/19/2020   TRIG 133 03/19/2020   CHOLHDL 3.8 03/19/2020   She has been working on diet and exercise for lifestyle managed T2 diabetes that has fortunately remained <7%, she is on bASA, she is on ACE/ARB and denies paresthesia of the feet, polydipsia, polyuria and visual disturbances. Last A1C in the office was:  Lab Results  Component Value Date   HGBA1C 6.6 (H) 03/19/2020    She has CKD IIIb r/t htn and T2DM monitored here Lab Results  Component Value Date   GFRNONAA 32 (L) 03/19/2020   GFRNONAA 34 (L) 12/03/2019   GFRNONAA 37 (L) 07/18/2019   She is on thyroid medication. Her medication was not changed last visit.   Lab Results  Component Value Date   TSH 2.97 03/19/2020   Patient is on Vitamin D supplement and now calcium pills, states  she has less back pain.   Lab Results  Component Value Date   VD25OH 24 (L) 12/03/2019        Current Medications:  Current Outpatient Medications on File Prior to Visit  Medication Sig Dispense Refill   albuterol (PROVENTIL) (2.5 MG/3ML) 0.083% nebulizer solution Take 3 mLs (2.5 mg total) by nebulization every 6 (six) hours as needed for wheezing or shortness of breath. 75 mL 12  aspirin-acetaminophen-caffeine (EXCEDRIN MIGRAINE) 250-250-65 MG per tablet Take 1 tablet by mouth every 6 (six) hours as needed for headache.     busPIRone (BUSPAR) 10 MG tablet TAKE 1/2 TO 1 TABLET 3 X /DAY FOR ANXIETY 270 tablet 3   diphenhydrAMINE HCl, Sleep, 50 MG CAPS Take 1 capsule by mouth daily.     furosemide (LASIX) 40 MG tablet TAKE 1 TABLET 2 TO 3 X /DAY AS DIRECTED FOR BP & FLUID RETENTION / ANKLE SWELLING 270 tablet 3   ipratropium-albuterol (DUONEB) 0.5-2.5 (3) MG/3ML SOLN USE 1 NEBULE VIA NEBULIZATION EVERY 4 HOURS AS NEEDED (MAX 6 DOSES PER DAY) 540 mL 3   metolazone (ZAROXOLYN) 2.5 MG tablet Take 2.5 mg by mouth as needed.     No current facility-administered medications on file prior to visit.   Allergies:  Allergies  Allergen Reactions   Sulfonamide Derivatives Hives and Itching   Tikosyn [Dofetilide]     Prolonged qt   Medical History:  She has Depression, major, recurrent, moderate (HCC); OSA and COPD overlap syndrome ([place]); G E R D; CHF (congestive heart failure), NYHA class III, chronic, diastolic ([place]); History of pulmonary embolus (PE); Atrial flutter (HCC); COPD (chronic obstructive pulmonary disease) ([place]); Atrial fibrillation ([place]); Hypothyroid; Hyperlipidemia associated with type 2 diabetes mellitus ([place]); HTN (hypertension); Type 2 diabetes with kidney complications ([place]); Anxiety; Vitamin D deficiency; Morbid obesity ([place]); Noncompliance; CKD stage G3b/A1, GFR 30-44 and albumin creatinine ratio <30 mg/g ([place]); Pulmonary hypertension due to COPD ([place]); Obstructive sleep apnea  treated with continuous positive airway pressure (CPAP); Constipation; and Benign hypertensive heart and kidney disease with diastolic CHF, NYHA class 3 and CKD stage 3 ([place]) on their problem list.   Health Maintenance:   Immunization History  Administered Date(s) Administered   Influenza Inj Mdck Quad With Preservative 11/30/2018, 12/03/2019   PFIZER(Purple Top)SARS-COV-2 Vaccination 12/02/2019, 12/25/2019   PNEUMOCOCCAL CONJUGATE-20 01/15/2021   Td 02/08/2001   Tdap 11/07/2017    Tetanus: 2019 Pneumovax: 01/15/21 Prevnar 13:  Flu vaccine:11/2020 at kidney doctor Shingrix: will check with insurance  Covid 19: 1/2, 2021, pfizer  Pap: 2010 -has been declining "I'm embarrassed about my weight." after long discussion will refer to GYN currently refuses MGM: 2006 OVERDUE -poor follow through -has not had  DEXA: age 46  Colonoscopy: 2003 colon cancer in her mom late 56's passed age 60 - states can not do the colonoscopy due to health, Dr. Collene Mares discussed with Dr. Missy Sabins, medium, advised work on weight loss first  Cologuard ordered today  Last eye: several years, encouraged overdue diabetic eye, she declines referral for this today- strongly encouraged appointment Last dental:   Patient Care Team: Unk Pinto, MD as PCP - General (Internal Medicine) Stanford Breed Denice Bors, MD as Consulting Physician (Cardiology) Bensimhon, Shaune Pascal, MD as Consulting Physician (Cardiology)  Surgical History:  She has a past surgical history that includes Tubal ligation (1996); TEE without cardioversion (01/19/2012); Cardioversion (01/19/2012); Cardioversion (01/21/2012); and basal skin can. Family History:  Herfamily history includes Allergies in her father; Asthma in her father; Colon cancer in her mother; Coronary artery disease in her father and mother; Depression in her daughter; Emphysema in her father; Heart attack in her maternal grandfather; Heart disease in her maternal grandmother. Social  History:  She reports that she quit smoking about 11 years ago. Her smoking use included cigarettes. She has a 70.00 pack-year smoking history. She has never used smokeless tobacco. She reports that she does not drink alcohol and does not use  drugs.  Review of Systems: Review of Systems  Constitutional: Negative.  Negative for malaise/fatigue and weight loss.  HENT: Negative.  Negative for hearing loss and tinnitus.   Eyes: Negative.  Negative for blurred vision and double vision.  Respiratory:  Positive for shortness of breath (on exertion). Negative for cough, sputum production and wheezing.   Cardiovascular: Negative.  Negative for chest pain, palpitations, orthopnea, claudication, leg swelling and PND.  Gastrointestinal: Negative.  Negative for abdominal pain, blood in stool, constipation, diarrhea, heartburn, melena, nausea and vomiting.  Genitourinary: Negative.  Negative for dysuria.  Musculoskeletal:  Positive for back pain and joint pain (intermittent hips, back). Negative for falls and myalgias.  Skin: Negative.  Negative for rash.  Neurological:  Negative for dizziness, tingling, sensory change, weakness and headaches.  Endo/Heme/Allergies:  Negative for polydipsia. Does not bruise/bleed easily.  Psychiatric/Behavioral:  Positive for depression. Negative for memory loss, substance abuse and suicidal ideas. The patient is not nervous/anxious and does not have insomnia.   All other systems reviewed and are negative.  Physical Exam: Estimated body mass index is 54.32 kg/m as calculated from the following:   Height as of this encounter: 4' 10.5" (1.486 m).   Weight as of this encounter: 264 lb 6.4 oz (119.9 kg).  BP 112/74   Pulse 84   Temp 97.7 F (36.5 C)   Ht 4' 10.5" (1.486 m)   Wt 264 lb 6.4 oz (119.9 kg)   SpO2 94%   BMI 54.32 kg/m  General Appearance: Well dressed, morbidly obese, in no apparent distress. Eyes: PERRLA, EOMs, conjunctiva no swelling or  erythema Sinuses: No Frontal/maxillary tenderness ENT/Mouth: Ext aud canals clear, TMs without erythema, bulging. No erythema, swelling, or exudate on post pharynx.Tonsils not swollen or erythematous. Hearing normal. Crowded mouth Neck: Supple, thyroid normal.  Respiratory: Decreased breath sounds bilateral, with wheezing and decreased breath sounds bilateral lower lobes Cardio: Irreg, irreg,  minimal edema Abdomen: Soft, + BS, Morbidly obese,  Non tender, no guarding, rebound, no palpable hernias, masses. Lymphatics: Non tender without lymphadenopathy.  Musculoskeletal: Body habitus limits exam, symmetrical ROM, 5/5 strength, antalgic gait with cane Skin: warm, dry intact, without notable rash, lesions, ecchymosis. Bilateral feet thickened long toenail no ulcers. Has very thick callouses bil, multiple seborrheic keratoses on torso Neuro: Cranial nerves intact. No cerebellar symptoms. Bil feet monofilament intact 10/10 but diminished throughout  Psych: Awake and oriented X 3,  Intermittently tearful/depressed affect, Insight and Judgment appropriate.  Breasts: defer to GYN, referral placed GU: body habitus limits - refer to GYN  EKG: A. Fib, no ST changes  Coti Burd W Danayah Smyre 2:36 PM [facility]

## 2021-01-13 ENCOUNTER — Encounter: Payer: No Typology Code available for payment source | Admitting: Nurse Practitioner

## 2021-01-13 ENCOUNTER — Telehealth: Payer: No Typology Code available for payment source | Admitting: Adult Health

## 2021-01-13 DIAGNOSIS — Z9989 Dependence on other enabling machines and devices: Secondary | ICD-10-CM | POA: Diagnosis not present

## 2021-01-13 DIAGNOSIS — G4733 Obstructive sleep apnea (adult) (pediatric): Secondary | ICD-10-CM | POA: Diagnosis not present

## 2021-01-15 ENCOUNTER — Ambulatory Visit (INDEPENDENT_AMBULATORY_CARE_PROVIDER_SITE_OTHER): Payer: No Typology Code available for payment source | Admitting: Nurse Practitioner

## 2021-01-15 ENCOUNTER — Encounter: Payer: Self-pay | Admitting: Nurse Practitioner

## 2021-01-15 ENCOUNTER — Other Ambulatory Visit: Payer: Self-pay

## 2021-01-15 VITALS — BP 112/74 | HR 84 | Temp 97.7°F | Ht 58.5 in | Wt 264.4 lb

## 2021-01-15 DIAGNOSIS — E1169 Type 2 diabetes mellitus with other specified complication: Secondary | ICD-10-CM

## 2021-01-15 DIAGNOSIS — E1122 Type 2 diabetes mellitus with diabetic chronic kidney disease: Secondary | ICD-10-CM

## 2021-01-15 DIAGNOSIS — I159 Secondary hypertension, unspecified: Secondary | ICD-10-CM

## 2021-01-15 DIAGNOSIS — Z23 Encounter for immunization: Secondary | ICD-10-CM

## 2021-01-15 DIAGNOSIS — I4819 Other persistent atrial fibrillation: Secondary | ICD-10-CM | POA: Diagnosis not present

## 2021-01-15 DIAGNOSIS — E559 Vitamin D deficiency, unspecified: Secondary | ICD-10-CM

## 2021-01-15 DIAGNOSIS — Z1231 Encounter for screening mammogram for malignant neoplasm of breast: Secondary | ICD-10-CM

## 2021-01-15 DIAGNOSIS — J449 Chronic obstructive pulmonary disease, unspecified: Secondary | ICD-10-CM

## 2021-01-15 DIAGNOSIS — Z0000 Encounter for general adult medical examination without abnormal findings: Secondary | ICD-10-CM | POA: Diagnosis not present

## 2021-01-15 DIAGNOSIS — Z1211 Encounter for screening for malignant neoplasm of colon: Secondary | ICD-10-CM

## 2021-01-15 DIAGNOSIS — F32A Depression, unspecified: Secondary | ICD-10-CM

## 2021-01-15 DIAGNOSIS — G8929 Other chronic pain: Secondary | ICD-10-CM

## 2021-01-15 DIAGNOSIS — Z136 Encounter for screening for cardiovascular disorders: Secondary | ICD-10-CM | POA: Diagnosis not present

## 2021-01-15 DIAGNOSIS — N1832 Chronic kidney disease, stage 3b: Secondary | ICD-10-CM

## 2021-01-15 DIAGNOSIS — F331 Major depressive disorder, recurrent, moderate: Secondary | ICD-10-CM

## 2021-01-15 DIAGNOSIS — M545 Low back pain, unspecified: Secondary | ICD-10-CM

## 2021-01-15 DIAGNOSIS — Z0001 Encounter for general adult medical examination with abnormal findings: Secondary | ICD-10-CM

## 2021-01-15 DIAGNOSIS — I4892 Unspecified atrial flutter: Secondary | ICD-10-CM | POA: Diagnosis not present

## 2021-01-15 DIAGNOSIS — K219 Gastro-esophageal reflux disease without esophagitis: Secondary | ICD-10-CM

## 2021-01-15 DIAGNOSIS — Z79899 Other long term (current) drug therapy: Secondary | ICD-10-CM

## 2021-01-15 DIAGNOSIS — Z86711 Personal history of pulmonary embolism: Secondary | ICD-10-CM

## 2021-01-15 DIAGNOSIS — E038 Other specified hypothyroidism: Secondary | ICD-10-CM

## 2021-01-15 DIAGNOSIS — I5032 Chronic diastolic (congestive) heart failure: Secondary | ICD-10-CM

## 2021-01-15 DIAGNOSIS — Z800 Family history of malignant neoplasm of digestive organs: Secondary | ICD-10-CM

## 2021-01-15 DIAGNOSIS — Z91199 Patient's noncompliance with other medical treatment and regimen due to unspecified reason: Secondary | ICD-10-CM

## 2021-01-15 MED ORDER — VENLAFAXINE HCL ER 75 MG PO CP24: 75.0000 mg | ORAL_CAPSULE | Freq: Every day | ORAL | 2 refills | Status: DC

## 2021-01-15 MED ORDER — RIVAROXABAN 20 MG PO TABS: ORAL_TABLET | ORAL | 3 refills | Status: DC

## 2021-01-15 MED ORDER — GABAPENTIN 300 MG PO CAPS: ORAL_CAPSULE | ORAL | 1 refills | Status: DC

## 2021-01-15 MED ORDER — ROSUVASTATIN CALCIUM 10 MG PO TABS: 10.0000 mg | ORAL_TABLET | Freq: Every day | ORAL | 3 refills | Status: DC

## 2021-01-15 MED ORDER — ALBUTEROL SULFATE HFA 108 (90 BASE) MCG/ACT IN AERS: 2.0000 | INHALATION_SPRAY | Freq: Four times a day (QID) | RESPIRATORY_TRACT | 1 refills | Status: DC | PRN

## 2021-01-15 MED ORDER — TRAZODONE HCL 100 MG PO TABS: ORAL_TABLET | ORAL | 3 refills | Status: DC

## 2021-01-15 MED ORDER — TRELEGY ELLIPTA 100-62.5-25 MCG/ACT IN AEPB: 100.0000 ug | INHALATION_SPRAY | Freq: Every day | RESPIRATORY_TRACT | 6 refills | Status: DC

## 2021-01-15 MED ORDER — BISOPROLOL FUMARATE 5 MG PO TABS: ORAL_TABLET | ORAL | 3 refills | Status: DC

## 2021-01-15 MED ORDER — POTASSIUM CHLORIDE ER 10 MEQ PO TBCR: EXTENDED_RELEASE_TABLET | ORAL | 3 refills | Status: DC

## 2021-01-15 MED ORDER — DILTIAZEM HCL ER COATED BEADS 360 MG PO CP24: ORAL_CAPSULE | ORAL | 3 refills | Status: DC

## 2021-01-15 NOTE — Addendum Note (Signed)
Addended by: Arelia Sneddon on: 01/15/2021 10:42 AM   Modules accepted: Orders

## 2021-01-15 NOTE — Addendum Note (Signed)
Addended by: Magda Bernheim on: 01/15/2021 02:39 PM   Modules accepted: Orders

## 2021-01-16 ENCOUNTER — Encounter: Payer: Self-pay | Admitting: Internal Medicine

## 2021-01-16 ENCOUNTER — Other Ambulatory Visit: Payer: Self-pay | Admitting: Nurse Practitioner

## 2021-01-16 ENCOUNTER — Encounter: Payer: Self-pay | Admitting: Nurse Practitioner

## 2021-01-16 DIAGNOSIS — R8290 Unspecified abnormal findings in urine: Secondary | ICD-10-CM

## 2021-01-17 LAB — MICROALBUMIN / CREATININE URINE RATIO
Creatinine, Urine: 248 mg/dL (ref 20–275)
Microalb Creat Ratio: 51 mcg/mg creat — ABNORMAL HIGH (ref <30)
Microalb, Ur: 12.6 mg/dL

## 2021-01-17 LAB — URINALYSIS, ROUTINE W REFLEX MICROSCOPIC
Bilirubin Urine: NEGATIVE
Glucose, UA: NEGATIVE
Nitrite: NEGATIVE
Specific Gravity, Urine: 1.026 (ref 1.001–1.035)
WBC, UA: >=60 /HPF — AB (ref 0–5)
pH: 5.5 (ref 5.0–8.0)

## 2021-01-17 LAB — VITAMIN D 25 HYDROXY (VIT D DEFICIENCY, FRACTURES): Vit D, 25-Hydroxy: 43 ng/mL (ref 30–100)

## 2021-01-17 LAB — COMPLETE METABOLIC PANEL WITH GFR
AG Ratio: 1.9 (calc) (ref 1.0–2.5)
ALT: 27 U/L (ref 6–29)
AST: 16 U/L (ref 10–35)
Albumin: 4.2 g/dL (ref 3.6–5.1)
Alkaline phosphatase (APISO): 89 U/L (ref 37–153)
BUN/Creatinine Ratio: 13 (calc) (ref 6–22)
BUN: 18 mg/dL (ref 7–25)
CO2: 28 mmol/L (ref 20–32)
Calcium: 9.6 mg/dL (ref 8.6–10.4)
Chloride: 105 mmol/L (ref 98–110)
Creat: 1.42 mg/dL — ABNORMAL HIGH (ref 0.50–1.05)
Globulin: 2.2 g/dL (calc) (ref 1.9–3.7)
Glucose, Bld: 113 mg/dL — ABNORMAL HIGH (ref 65–99)
Potassium: 4.5 mmol/L (ref 3.5–5.3)
Sodium: 144 mmol/L (ref 135–146)
Total Bilirubin: 0.6 mg/dL (ref 0.2–1.2)
Total Protein: 6.4 g/dL (ref 6.1–8.1)
eGFR: 42 mL/min/{1.73_m2} — ABNORMAL LOW (ref >=60)

## 2021-01-17 LAB — CBC WITH DIFFERENTIAL/PLATELET
Absolute Monocytes: 372 cells/uL (ref 200–950)
Basophils Absolute: 29 cells/uL (ref 0–200)
Basophils Relative: 0.4 %
Eosinophils Absolute: 7 cells/uL — ABNORMAL LOW (ref 15–500)
Eosinophils Relative: 0.1 %
HCT: 40.7 % (ref 35.0–45.0)
Hemoglobin: 13.4 g/dL (ref 11.7–15.5)
Lymphs Abs: 1372 cells/uL (ref 850–3900)
MCH: 29.1 pg (ref 27.0–33.0)
MCHC: 32.9 g/dL (ref 32.0–36.0)
MCV: 88.5 fL (ref 80.0–100.0)
MPV: 11.9 fL (ref 7.5–12.5)
Monocytes Relative: 5.1 %
Neutro Abs: 5519 cells/uL (ref 1500–7800)
Neutrophils Relative %: 75.6 %
Platelets: 237 10*3/uL (ref 140–400)
RBC: 4.6 10*6/uL (ref 3.80–5.10)
RDW: 13.6 % (ref 11.0–15.0)
Total Lymphocyte: 18.8 %
WBC: 7.3 10*3/uL (ref 3.8–10.8)

## 2021-01-17 LAB — TSH: TSH: 3.05 mIU/L (ref 0.40–4.50)

## 2021-01-17 LAB — LIPID PANEL
Cholesterol: 190 mg/dL (ref <200)
HDL: 46 mg/dL — ABNORMAL LOW (ref >=50)
LDL Cholesterol (Calc): 117 mg/dL (calc) — ABNORMAL HIGH
Non-HDL Cholesterol (Calc): 144 mg/dL (calc) — ABNORMAL HIGH (ref <130)
Total CHOL/HDL Ratio: 4.1 (calc) (ref <5.0)
Triglycerides: 152 mg/dL — ABNORMAL HIGH (ref <150)

## 2021-01-17 LAB — HEMOGLOBIN A1C
Hgb A1c MFr Bld: 6.5 % of total Hgb — ABNORMAL HIGH (ref <5.7)
Mean Plasma Glucose: 140 mg/dL
eAG (mmol/L): 7.7 mmol/L

## 2021-01-17 LAB — MAGNESIUM: Magnesium: 2.1 mg/dL (ref 1.5–2.5)

## 2021-02-10 ENCOUNTER — Other Ambulatory Visit: Payer: Self-pay | Admitting: Nurse Practitioner

## 2021-02-10 DIAGNOSIS — F331 Major depressive disorder, recurrent, moderate: Secondary | ICD-10-CM

## 2021-02-18 ENCOUNTER — Ambulatory Visit: Payer: No Typology Code available for payment source

## 2021-04-18 ENCOUNTER — Other Ambulatory Visit: Payer: Self-pay | Admitting: Internal Medicine

## 2021-04-24 ENCOUNTER — Other Ambulatory Visit: Payer: Self-pay | Admitting: Adult Health Nurse Practitioner

## 2021-04-24 DIAGNOSIS — J441 Chronic obstructive pulmonary disease with (acute) exacerbation: Secondary | ICD-10-CM

## 2021-04-27 ENCOUNTER — Other Ambulatory Visit: Payer: Self-pay | Admitting: Nurse Practitioner

## 2021-04-27 DIAGNOSIS — J449 Chronic obstructive pulmonary disease, unspecified: Secondary | ICD-10-CM

## 2021-04-27 DIAGNOSIS — J441 Chronic obstructive pulmonary disease with (acute) exacerbation: Secondary | ICD-10-CM

## 2021-04-27 MED ORDER — IPRATROPIUM-ALBUTEROL 0.5-2.5 (3) MG/3ML IN SOLN: RESPIRATORY_TRACT | 3 refills | Status: DC

## 2021-04-27 MED ORDER — ALBUTEROL SULFATE (2.5 MG/3ML) 0.083% IN NEBU: 2.5000 mg | INHALATION_SOLUTION | Freq: Four times a day (QID) | RESPIRATORY_TRACT | 12 refills | Status: DC | PRN

## 2021-04-29 ENCOUNTER — Other Ambulatory Visit: Payer: Self-pay | Admitting: Nurse Practitioner

## 2021-04-29 DIAGNOSIS — J441 Chronic obstructive pulmonary disease with (acute) exacerbation: Secondary | ICD-10-CM

## 2021-05-06 NOTE — Progress Notes (Signed)
3 month follow up ? ?Assessment and Plan: ? ? ?Chronic atrial fibrillation (HCC) ?Resent CCB, rate controlled, on NOAC, has follow up cardio upcoming ? ?Secondary hypertension ?- cardiology following, continue medications, DASH diet, exercise and monitor at home. Call if greater than 130/80.  ? ?Other pulmonary embolism without acute cor pulmonale, unspecified chronicity ([place]) ?Stay on CPAP, continue xarelto ? ?Atrial flutter, unspecified type ([place]) ?Continue medications, rate controlled, on NOAC, follow up cardio ? ?Pulmonary hypertension due to COPD [facility]) ?Stay on CPAP, weight loss advised ? ?CHF NYHA class III (symptoms with mildly strenuous activities), chronic, diastolic ([place]) ?Follow up with cardio, monitor weight daily, weight loss advised ? ?OSA and COPD overlap syndrome ([place]) ?Stay on CPAP, weight loss advised ? ?Chronic obstructive pulmonary disease, unspecified COPD type ([place]) ?Continue inhalers ? ?Type 2 diabetes mellitus with CKD ([place]) ?Discussed general issues about diabetes pathophysiology and management., Educational material distributed., Suggested low cholesterol diet., Encouraged aerobic exercise., Discussed foot care., Reminded to get yearly retinal exam. ?CKD 3b/4 - will not intiate metformin ?She tried [place] and could not tolerate due to vaginal itching ?- CBC ?-     BMP WITH GFR ?-     Hemoglobin A1c ? ?Hyperlipidemia assocaited with T2DM ([place]) ?check lipids ?decrease fatty foods ?increase activity.  ?-     Lipid panel ? ?CKD (chronic kidney disease) stage 3b, GFR 44-30 ml/min (HCC) ?avoid NSAIDS, control glucose/BP, will monitor ?-     COMPLETE METABOLIC PANEL WITH GFR ? ?Other specified hypothyroidism ?-check TSH level, continue medications the same, reminded to take on an empty stomach 30-37mns before food.  ?-     TSH ? ?Depression, major, recurrent, moderate (HCC)/anxiety  ?- continue medications, stress management techniques discussed, increase water, good sleep hygiene discussed,  increase exercise, and increase veggies.  ?- Will continue Duloxetine 60 mg daily, Buspar as needed ?- Insomnia is controlled with Trazodone ? ? ? ?Morbid obesity ([place] ?- follow up 3 months for progress monitoring; she will work on increasing fruit/veggies intake ?- increase veggies, try to limit snacking ?- long discussion about weight loss, diet, and exercise ?- given information about emotional eating ?- unable to give herself shots ? ?Vitamin D deficiency ?-     VITAMIN D 25 Hydroxy (Vit-D Deficiency, Fractures) ? ?Gastroesophageal reflux disease, esophagitis presence not specified ?Continue PPI/H2 blocker, diet discussed ? ?Noncompliance ?Hx of; improved; will discuss barriers regularly and individualize plan to assist with compliance ? ?Medication management ?-     Magnesium ? ? ? ? ? ?Discussed med's effects and SE's. Screening labs and tests as requested with regular follow-up as recommended. ?Over 30 minutes of exam, counseling, chart review, and complex decision making was performed this visit.  ?Future Appointments  ?Date Time Provider [place] ?05/08/2021  9:30 AM MMagda Bernheim NP [facility] None  ?01/15/2022  9:00 AM MMagda Bernheim NP [facility] None  ?01/19/2022  1:45 PM MWard Givens NP [facility] None  ? ? ?HPI  ?61y.o. female  presents for 3 month follow up for T2DM with CKD, hyperlipidemia, htn, vit D def, depression, morbid obesity, hypothyroid.  ? ?Retired; She doesn't drive, family drives as needed.  Separated from husband since 2009 ? ?Youngest son moved out in 03/2020, joined the [employer] [place] 09[place]2020 [place]  [place] for 10 days and [place]  2 Kids don't talk to her.  Work stress, working from home.  Mood is  much better.  She did not start Effexor , was taking PRN, so was not in her system.   Has been taking Duloxetine 1/2 pill and Buspar. Mood seems to be controlled ?She takes naproxen PRN rarely for aches and pains.  ? ?BMI is Body mass  index is 54.48 kg/m?., she is working on diet and exercise. She reports just started working with dietician from work, doing more yogurt/fruit, eggs. Doesn't track weight.   ?Wt Readings from Last 3 Encounters:  ?05/08/21 265 lb 3.2 oz (120.3 kg)  ?01/15/21 264 lb 6.4 oz (119.9 kg)  ?03/31/20 263 lb 3.2 oz (119.4 kg)  ? ?She has a history of afib/flutter and CHF, she is on amiodarone, she is on xarelto, spriolactone 1/2 at night, following with Dr. Haroldine Laws.  Reports has not been taking diltiazem x 1 month after running out and having trouble getting refilled, has noted some palpitations and shortness of breath. She is on lasix 2 a day and zaroxyln 1 x a week and 6-8 potassium a day.  ? ?She also has PHTN due to COPD and OSA, she is on CPAP, follows with neurology, endorses 100% compliance. She follows with Dr. Brett Fairy.  ? ?She is on trellegy ellipta for COPD, nebs PRN and reports doing well.  ? ?Her blood pressure has been controlled at home, today their BP is BP: 112/72  ?BP Readings from Last 3 Encounters:  ?05/08/21 112/72  ?01/15/21 112/74  ?03/31/20 122/78  ? ? ? She does not workout. She denies chest pain, shortness of breath, dizziness. ? ?She is on cholesterol medication , rosuvastatin 10 mg and denies myalgias. Her cholesterol is not at goal. The cholesterol last visit was:   ?Lab Results  ?Component Value Date  ? CHOL 190 01/15/2021  ? HDL 46 (L) 01/15/2021  ? LDLCALC 117 (H) 01/15/2021  ? TRIG 152 (H) 01/15/2021  ? CHOLHDL 4.1 01/15/2021  ? ?She has been working on diet and exercise for lifestyle managed T2 diabetes that has fortunately remained <7%, she is on bASA, she is on ACE/ARB and denies paresthesia of the feet, polydipsia, polyuria and visual disturbances.  ?Last A1C in the office was:  ?Lab Results  ?Component Value Date  ? HGBA1C 6.5 (H) 01/15/2021  ? ? She has CKD IIIb r/t htn and T2DM monitored here, admits to poor water intake recently-  ?Lab Results  ?Component Value Date  ? GFRNONAA 32  (L) 03/19/2020  ? GFRNONAA 34 (L) 12/03/2019  ? GFRNONAA 37 (L) 07/18/2019  ? ?She is on thyroid medication. Her medication was not changed last visit.   ?Lab Results  ?Component Value Date  ? TSH 3.05 01/15/2021  ? ?Patient is on Vitamin D supplement and now calcium pills, states she has less back pain. Taking 5000 IU.   ?Lab Results  ?Component Value Date  ? VD25OH 43 01/15/2021  ?   ? ? ? ?Current Medications:  ?Current Outpatient Medications on File Prior to Visit  ?Medication Sig Dispense Refill  ? albuterol (PROVENTIL) (2.5 MG/3ML) 0.083% nebulizer solution Take 3 mLs (2.5 mg total) by nebulization every 6 (six) hours as needed for wheezing or shortness of breath. 75 mL 12  ? aspirin-acetaminophen-caffeine (EXCEDRIN MIGRAINE) 403-474-25 MG per tablet Take 1 tablet by mouth every 6 (six) hours as needed for headache.    ? bisoprolol (ZEBETA) 5 MG tablet Take  1 tablet  Daily  for BP (Dx: i.10) 90 tablet 3  ? busPIRone (BUSPAR) 10 MG tablet TAKE  1/2 TO 1 TABLET 3 X /DAY FOR ANXIETY 270 tablet 3  ? diltiazem (CARDIZEM CD) 360 MG 24 hr capsule Take 1 capsule daily for BP & Heart Rhythm 90 capsule 3  ? diphenhydrAMINE HCl, Sleep, 50 MG CAPS Take 1 capsule by mouth daily.    ? Fluticasone-Umeclidin-Vilant (TRELEGY ELLIPTA) 100-62.5-25 MCG/ACT AEPB Inhale 100 mcg into the lungs daily. 60 each 6  ? furosemide (LASIX) 40 MG tablet TAKE 1 TABLET 2 TO 3 X /DAY AS DIRECTED FOR BP & FLUID RETENTION / ANKLE SWELLING 270 tablet 3  ? gabapentin (NEURONTIN) 300 MG capsule Take 1 capsule 3 x /day for Chronic Back Pain 270 capsule 1  ? ipratropium-albuterol (DUONEB) 0.5-2.5 (3) MG/3ML SOLN USE 1 NEBULE VIA NEBULIZATION EVERY 4 HOURS AS NEEDED (MAX 6 DOSES PER DAY) 540 mL 3  ? metolazone (ZAROXOLYN) 2.5 MG tablet Take 2.5 mg by mouth as needed.    ? potassium chloride (KLOR-CON) 10 MEQ tablet TAKE 2 TABLETS 2 X /DAY FOR POTASSIUM & TAKE ADDITIONAL 2 TABLETS WITH ADDITIONAL DOSE OF LASIX 360 tablet 3  ? rivaroxaban (XARELTO) 20 MG  TABS tablet Take  1 tablet  Daily  to Prevent Blood Clots from Atrial Fibrillation  (Dx:  i48.91) 90 tablet 3  ? rosuvastatin (CRESTOR) 10 MG tablet Take 1 tablet (10 mg total) by mouth daily. 90 tablet 3

## 2021-05-08 ENCOUNTER — Ambulatory Visit: Payer: No Typology Code available for payment source | Admitting: Nurse Practitioner

## 2021-05-08 ENCOUNTER — Encounter: Payer: Self-pay | Admitting: Nurse Practitioner

## 2021-05-08 VITALS — BP 112/72 | HR 76 | Temp 97.9°F | Resp 18 | Ht 58.5 in | Wt 265.2 lb

## 2021-05-08 DIAGNOSIS — I2723 Pulmonary hypertension due to lung diseases and hypoxia: Secondary | ICD-10-CM

## 2021-05-08 DIAGNOSIS — G4733 Obstructive sleep apnea (adult) (pediatric): Secondary | ICD-10-CM

## 2021-05-08 DIAGNOSIS — I4819 Other persistent atrial fibrillation: Secondary | ICD-10-CM

## 2021-05-08 DIAGNOSIS — N1832 Chronic kidney disease, stage 3b: Secondary | ICD-10-CM

## 2021-05-08 DIAGNOSIS — K219 Gastro-esophageal reflux disease without esophagitis: Secondary | ICD-10-CM

## 2021-05-08 DIAGNOSIS — E785 Hyperlipidemia, unspecified: Secondary | ICD-10-CM

## 2021-05-08 DIAGNOSIS — I4892 Unspecified atrial flutter: Secondary | ICD-10-CM | POA: Diagnosis not present

## 2021-05-08 DIAGNOSIS — Z86711 Personal history of pulmonary embolism: Secondary | ICD-10-CM | POA: Diagnosis not present

## 2021-05-08 DIAGNOSIS — I159 Secondary hypertension, unspecified: Secondary | ICD-10-CM

## 2021-05-08 DIAGNOSIS — F331 Major depressive disorder, recurrent, moderate: Secondary | ICD-10-CM

## 2021-05-08 DIAGNOSIS — J449 Chronic obstructive pulmonary disease, unspecified: Secondary | ICD-10-CM | POA: Diagnosis not present

## 2021-05-08 DIAGNOSIS — I5032 Chronic diastolic (congestive) heart failure: Secondary | ICD-10-CM

## 2021-05-08 DIAGNOSIS — Z91199 Patient's noncompliance with other medical treatment and regimen due to unspecified reason: Secondary | ICD-10-CM

## 2021-05-08 DIAGNOSIS — E1122 Type 2 diabetes mellitus with diabetic chronic kidney disease: Secondary | ICD-10-CM

## 2021-05-08 DIAGNOSIS — E039 Hypothyroidism, unspecified: Secondary | ICD-10-CM

## 2021-05-08 DIAGNOSIS — E1169 Type 2 diabetes mellitus with other specified complication: Secondary | ICD-10-CM

## 2021-05-08 DIAGNOSIS — Z79899 Other long term (current) drug therapy: Secondary | ICD-10-CM

## 2021-05-08 DIAGNOSIS — E559 Vitamin D deficiency, unspecified: Secondary | ICD-10-CM

## 2021-05-08 MED ORDER — DILTIAZEM HCL ER COATED BEADS 360 MG PO CP24: ORAL_CAPSULE | ORAL | 3 refills | Status: DC

## 2021-05-08 MED ORDER — METOLAZONE 2.5 MG PO TABS: 2.5000 mg | ORAL_TABLET | ORAL | 1 refills | Status: DC | PRN

## 2021-05-08 MED ORDER — DULOXETINE HCL 60 MG PO CPEP: 60.0000 mg | ORAL_CAPSULE | Freq: Every day | ORAL | 3 refills | Status: DC

## 2021-05-08 NOTE — Patient Instructions (Signed)
Stop Effexor(Venlafaxine) ?Take Buspar up to three times a day as needed for anxiety ?Take Duloxetine(Cymbalta) 60 mg daily at bedtime ? ? ?Diabetes Mellitus and Nutrition, Adult ?When you have diabetes, or diabetes mellitus, it is very important to have healthy eating habits because your blood sugar (glucose) levels are greatly affected by what you eat and drink. Eating healthy foods in the right amounts, at about the same times every day, can help you: ?Manage your blood glucose. ?Lower your risk of heart disease. ?Improve your blood pressure. ?Reach or maintain a healthy weight. ?What can affect my meal plan? ?Every person with diabetes is different, and each person has different needs for a meal plan. Your health care provider may recommend that you work with a dietitian to make a meal plan that is best for you. Your meal plan may vary depending on factors such as: ?The calories you need. ?The medicines you take. ?Your weight. ?Your blood glucose, blood pressure, and cholesterol levels. ?Your activity level. ?Other health conditions you have, such as heart or kidney disease. ?How do carbohydrates affect me? ?Carbohydrates, also called carbs, affect your blood glucose level more than any other type of food. Eating carbs raises the amount of glucose in your blood. ?It is important to know how many carbs you can safely have in each meal. This is different for every person. Your dietitian can help you calculate how many carbs you should have at each meal and for each snack. ?How does alcohol affect me? ?Alcohol can cause a decrease in blood glucose (hypoglycemia), especially if you use insulin or take certain diabetes medicines by mouth. Hypoglycemia can be a life-threatening condition. Symptoms of hypoglycemia, such as sleepiness, dizziness, and confusion, are similar to symptoms of having too much alcohol. ?Do not drink alcohol if: ?Your health care provider tells you not to drink. ?You are pregnant, may be  pregnant, or are planning to become pregnant. ?If you drink alcohol: ?Limit how much you have to: ?0-1 drink a day for women. ?0-2 drinks a day for men. ?Know how much alcohol is in your drink. In the [place] one drink equals one 12 oz bottle of beer (355 mL), one 5 oz glass of wine (148 mL), or one 1? oz glass of hard liquor (44 mL). ?Keep yourself hydrated with water, diet soda, or unsweetened iced tea. Keep in mind that regular soda, juice, and other mixers may contain a lot of sugar and must be counted as carbs. ?What are tips for following this plan? ?Reading food labels ?Start by checking the serving size on the Nutrition Facts label of packaged foods and drinks. The number of calories and the amount of carbs, fats, and other nutrients listed on the label are based on one serving of the item. Many items contain more than one serving per package. ?Check the total grams (g) of carbs in one serving. ?Check the number of grams of saturated fats and trans fats in one serving. Choose foods that have a low amount or none of these fats. ?Check the number of milligrams (mg) of salt (sodium) in one serving. Most people should limit total sodium intake to less than 2,300 mg per day. ?Always check the nutrition information of foods labeled as "low-fat" or "nonfat." These foods may be higher in added sugar or refined carbs and should be avoided. ?Talk to your dietitian to identify your daily goals for nutrients listed on the label. ?Shopping ?Avoid buying canned, pre-made, or processed foods. These foods tend  to be high in fat, sodium, and added sugar. ?Shop around the outside edge of the grocery store. This is where you will most often find fresh fruits and vegetables, bulk grains, fresh meats, and fresh dairy products. ?Cooking ?Use low-heat cooking methods, such as baking, instead of high-heat cooking methods, such as deep frying. ?Cook using healthy oils, such as olive, canola, or sunflower oil. ?Avoid cooking with  butter, cream, or high-fat meats. ?Meal planning ?Eat meals and snacks regularly, preferably at the same times every day. Avoid going long periods of time without eating. ?Eat foods that are high in fiber, such as fresh fruits, vegetables, beans, and whole grains. ?Eat 4-6 oz (112-168 g) of lean protein each day, such as lean meat, chicken, fish, eggs, or tofu. One ounce (oz) (28 g) of lean protein is equal to: ?1 oz (28 g) of meat, chicken, or fish. ?1 egg. ?? cup (62 g) of tofu. ?Eat some foods each day that contain healthy fats, such as avocado, nuts, seeds, and fish. ?What foods should I eat? ?Fruits ?Berries. Apples. Oranges. Peaches. Apricots. Plums. Grapes. Mangoes. Papayas. Pomegranates. Kiwi. Cherries. ?Vegetables ?Leafy greens, including lettuce, spinach, kale, chard, collard greens, mustard greens, and cabbage. Beets. Cauliflower. Broccoli. Carrots. Green beans. Tomatoes. Peppers. Onions. Cucumbers. Brussels sprouts. ?Grains ?Whole grains, such as whole-wheat or whole-grain bread, crackers, tortillas, cereal, and pasta. Unsweetened oatmeal. Quinoa. Brown or wild rice. ?Meats and other proteins ?Seafood. Poultry without skin. Lean cuts of poultry and beef. Tofu. Nuts. Seeds. ?Dairy ?Low-fat or fat-free dairy products such as milk, yogurt, and cheese. ?The items listed above may not be a complete list of foods and beverages you can eat and drink. Contact a dietitian for more information. ?What foods should I avoid? ?Fruits ?Fruits canned with syrup. ?Vegetables ?Canned vegetables. Frozen vegetables with butter or cream sauce. ?Grains ?Refined white flour and flour products such as bread, pasta, snack foods, and cereals. Avoid all processed foods. ?Meats and other proteins ?Fatty cuts of meat. Poultry with skin. Breaded or fried meats. Processed meat. Avoid saturated fats. ?Dairy ?Full-fat yogurt, cheese, or milk. ?Beverages ?Sweetened drinks, such as soda or iced tea. ?The items listed above may not be a  complete list of foods and beverages you should avoid. Contact a dietitian for more information. ?Questions to ask a health care provider ?Do I need to meet with a certified diabetes care and education specialist? ?Do I need to meet with a dietitian? ?What number can I call if I have questions? ?When are the best times to check my blood glucose? ?Where to find more information: ?American Diabetes Association: diabetes.org ?Academy of Nutrition and Dietetics: eatright.org ?[employer] of Diabetes and Digestive and Kidney Diseases: AmenCredit.is ?Association of Diabetes Care & Education Specialists: diabeteseducator.org ?Summary ?It is important to have healthy eating habits because your blood sugar (glucose) levels are greatly affected by what you eat and drink. It is important to use alcohol carefully. ?A healthy meal plan will help you manage your blood glucose and lower your risk of heart disease. ?Your health care provider may recommend that you work with a dietitian to make a meal plan that is best for you. ?This information is not intended to replace advice given to you by your health care provider. Make sure you discuss any questions you have with your health care provider. ?Document Revised: 08/29/2019 Document Reviewed: 08/29/2019 ?Elsevier Patient Education ? [place]. ? ?

## 2021-05-09 LAB — CBC WITH DIFFERENTIAL/PLATELET
Absolute Monocytes: 350 cells/uL (ref 200–950)
Basophils Absolute: 11 cells/uL (ref 0–200)
Basophils Relative: 0.2 %
Eosinophils Absolute: 69 cells/uL (ref 15–500)
Eosinophils Relative: 1.3 %
HCT: 41.3 % (ref 35.0–45.0)
Hemoglobin: 13.4 g/dL (ref 11.7–15.5)
Lymphs Abs: 1352 cells/uL (ref 850–3900)
MCH: 28.9 pg (ref 27.0–33.0)
MCHC: 32.4 g/dL (ref 32.0–36.0)
MCV: 89 fL (ref 80.0–100.0)
MPV: 11.5 fL (ref 7.5–12.5)
Monocytes Relative: 6.6 %
Neutro Abs: 3519 cells/uL (ref 1500–7800)
Neutrophils Relative %: 66.4 %
Platelets: 194 10*3/uL (ref 140–400)
RBC: 4.64 10*6/uL (ref 3.80–5.10)
RDW: 13.5 % (ref 11.0–15.0)
Total Lymphocyte: 25.5 %
WBC: 5.3 10*3/uL (ref 3.8–10.8)

## 2021-05-09 LAB — BASIC METABOLIC PANEL WITH GFR
BUN/Creatinine Ratio: 13 (calc) (ref 6–22)
BUN: 19 mg/dL (ref 7–25)
CO2: 29 mmol/L (ref 20–32)
Calcium: 8.9 mg/dL (ref 8.6–10.4)
Chloride: 102 mmol/L (ref 98–110)
Creat: 1.48 mg/dL — ABNORMAL HIGH (ref 0.50–1.05)
Glucose, Bld: 122 mg/dL — ABNORMAL HIGH (ref 65–99)
Potassium: 3.9 mmol/L (ref 3.5–5.3)
Sodium: 143 mmol/L (ref 135–146)
eGFR: 40 mL/min/{1.73_m2} — ABNORMAL LOW (ref >=60)

## 2021-05-09 LAB — HEMOGLOBIN A1C
Hgb A1c MFr Bld: 6.4 % of total Hgb — ABNORMAL HIGH (ref <5.7)
Mean Plasma Glucose: 137 mg/dL
eAG (mmol/L): 7.6 mmol/L

## 2021-05-09 LAB — LIPID PANEL
Cholesterol: 179 mg/dL (ref <200)
HDL: 40 mg/dL — ABNORMAL LOW (ref >=50)
LDL Cholesterol (Calc): 111 mg/dL (calc) — ABNORMAL HIGH
Non-HDL Cholesterol (Calc): 139 mg/dL (calc) — ABNORMAL HIGH (ref <130)
Total CHOL/HDL Ratio: 4.5 (calc) (ref <5.0)
Triglycerides: 167 mg/dL — ABNORMAL HIGH (ref <150)

## 2021-05-09 LAB — TSH: TSH: 3.78 mIU/L (ref 0.40–4.50)

## 2021-05-16 ENCOUNTER — Other Ambulatory Visit: Payer: Self-pay | Admitting: Internal Medicine

## 2021-07-06 IMAGING — US US RENAL
1 series · 14 of 25 positions shown · non-contrast
Comparison: None.

CLINICAL DATA: Stage 3 chronic kidney disease

EXAM:
RENAL / URINARY TRACT ULTRASOUND COMPLETE

[Series 1: us renal · 0.21mm/px · 14 of 36 slices shown]
[im 1/36]
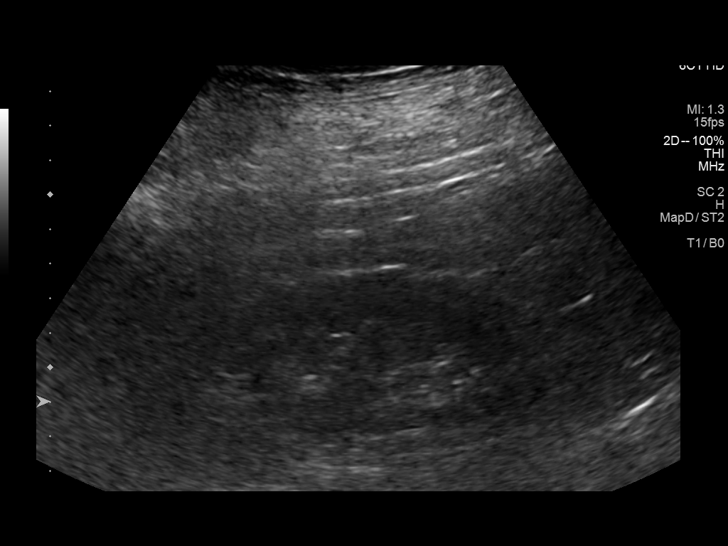
[im 3/36]
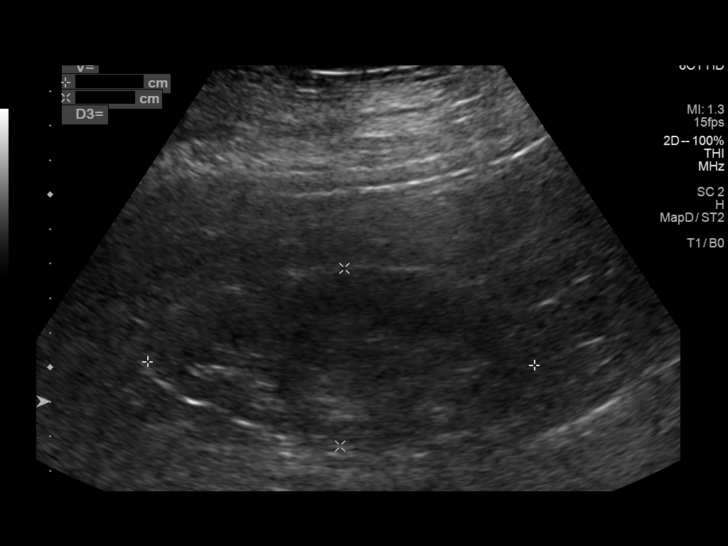
[im 6/36]
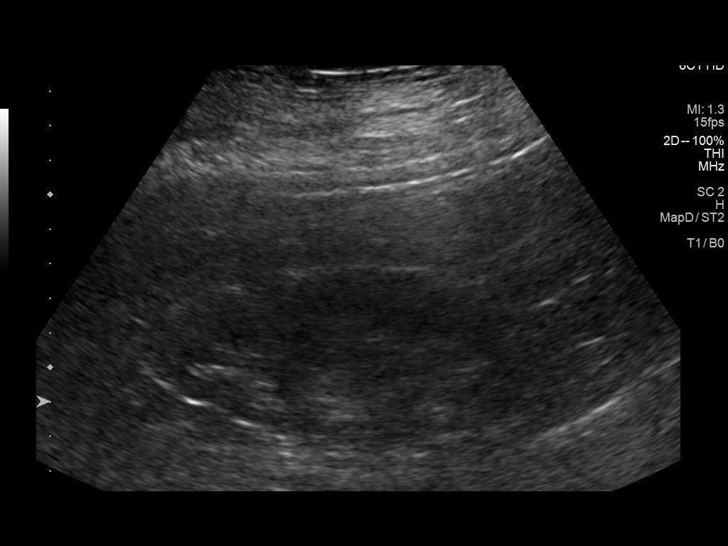
[im 9/36]
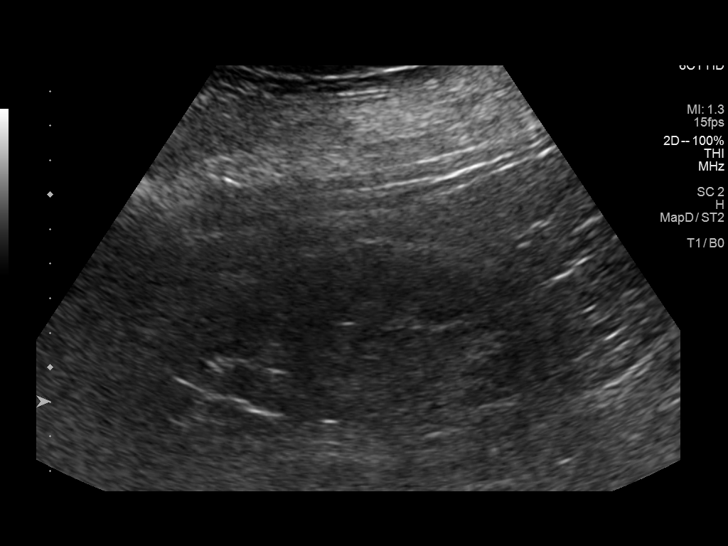
[im 12/36]
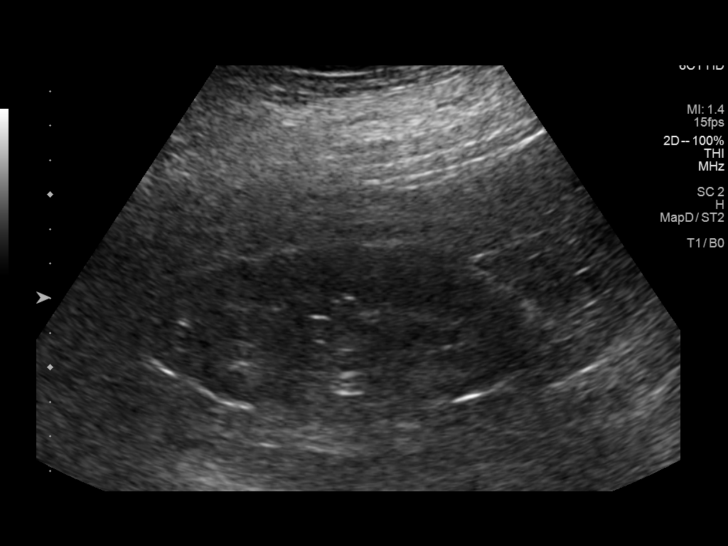
[im 14/36]
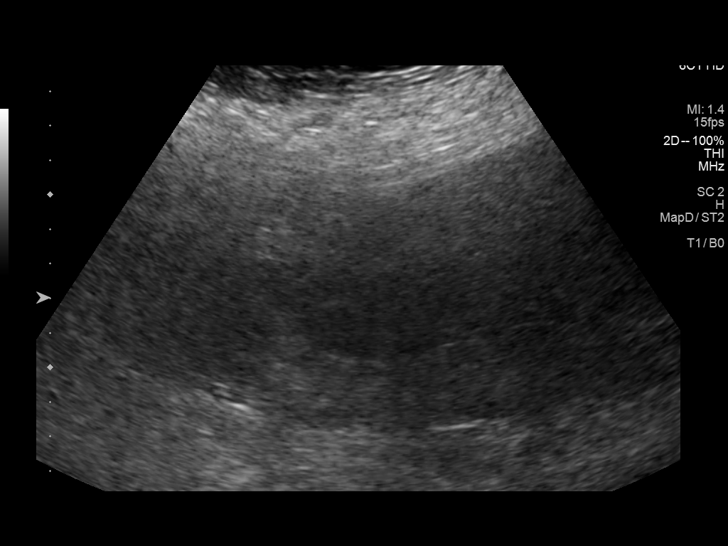
[im 17/36]
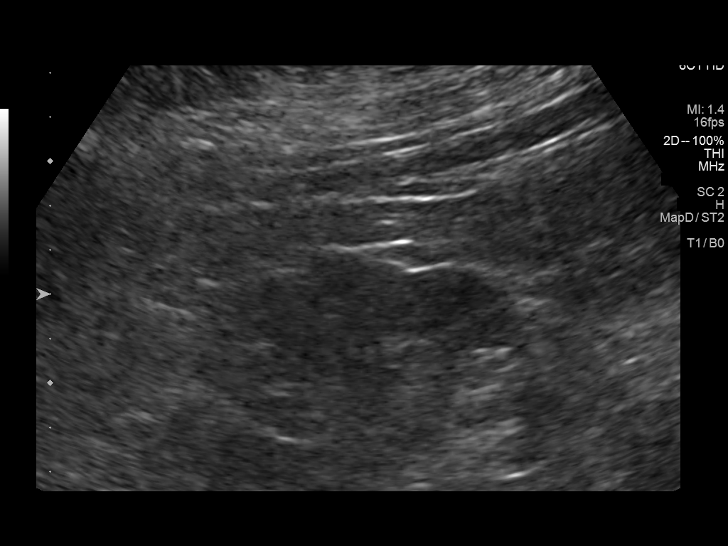
[im 19/36]
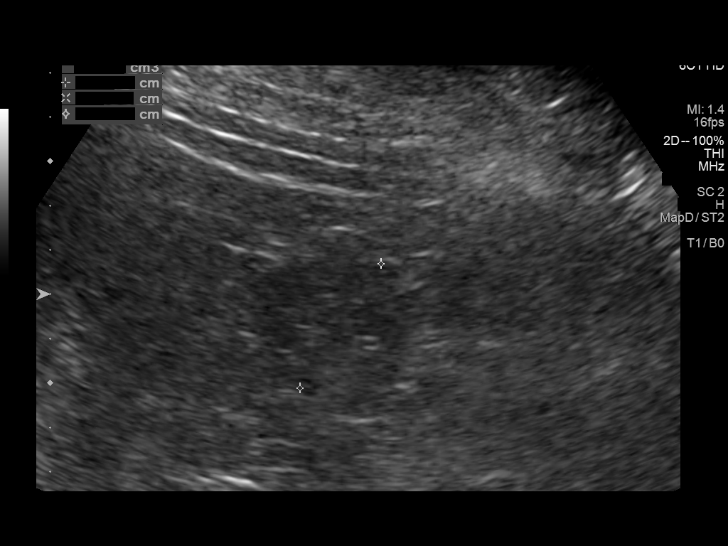
[im 22/36]
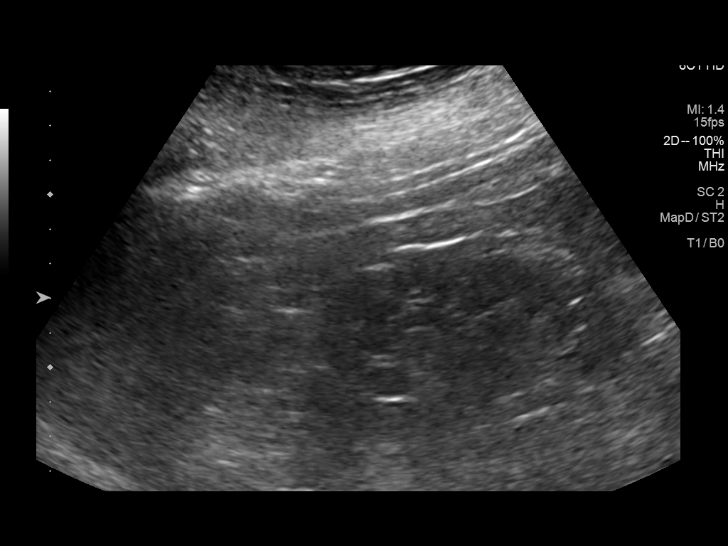
[im 24/36]
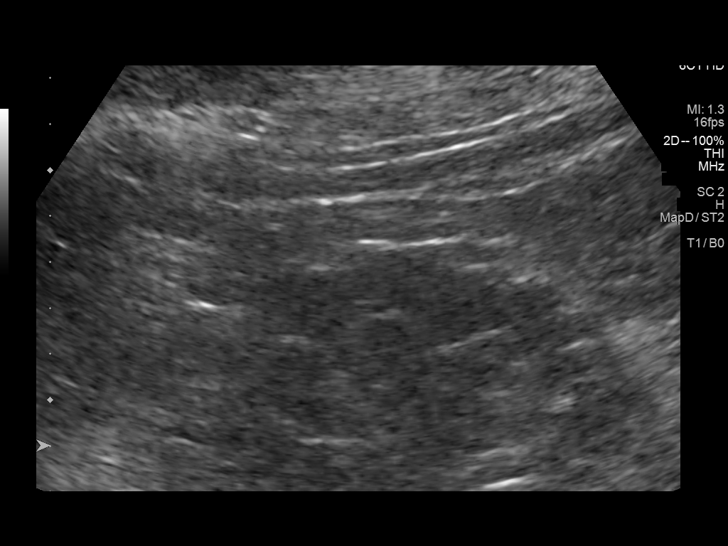
[im 27/36]
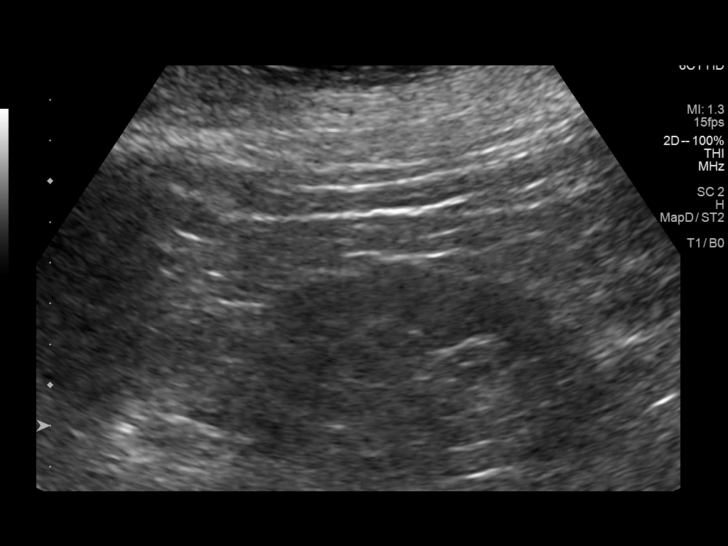
[im 30/36]
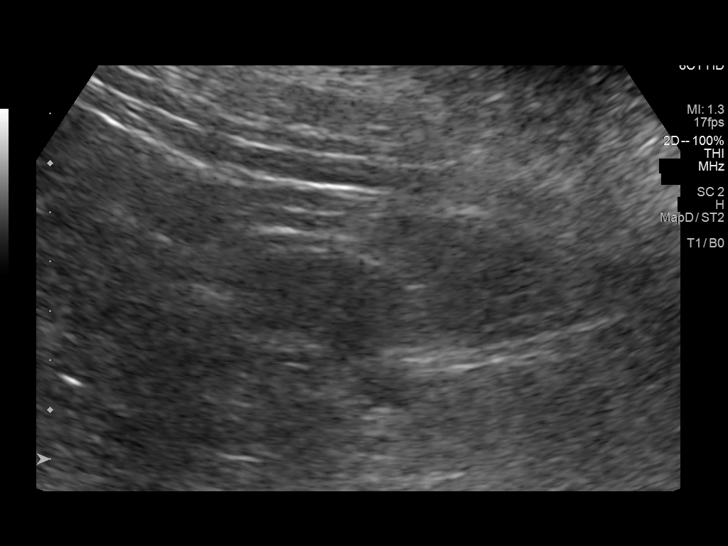
[im 33/36]
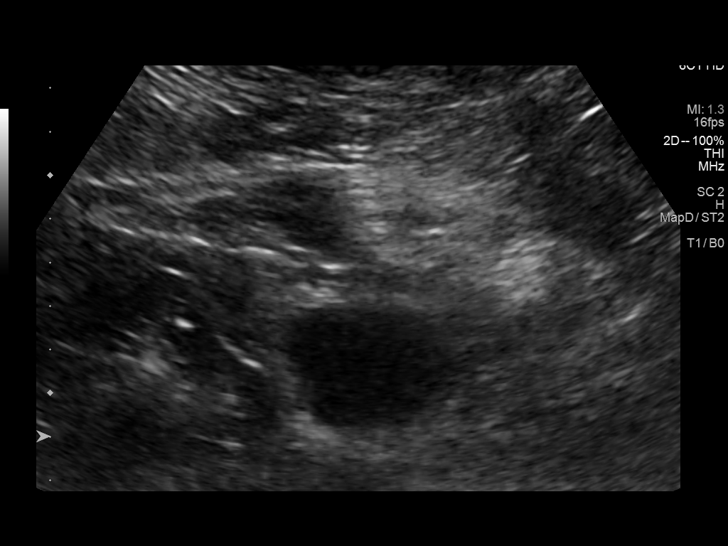
[im 36/36]
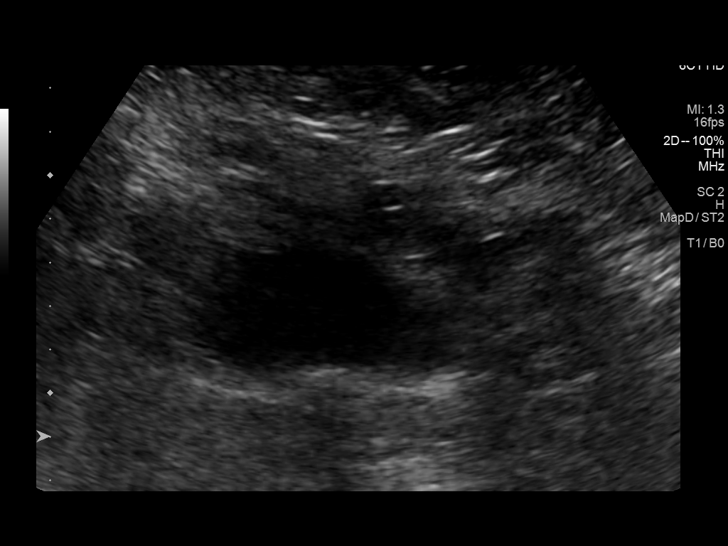

[14 of 25 positions shown; findings below may reference images not displayed]

FINDINGS: Right Kidney:

Renal measurements: 11.5 x 5.3 x 5.2 cm = volume: 166 mL.
Echogenicity within normal limits. No mass or hydronephrosis
visualized.

Left Kidney:

Renal measurements: 9.0 x 4.1 x 3.4 cm = volume: 65 mL. Echogenicity
within normal limits. No mass or hydronephrosis visualized.

Bladder:

Appears normal for degree of bladder distention.

Other:

Limited visualization of the kidneys.
IMPRESSION: No significant sonographic abnormality of the kidneys.

## 2021-07-15 ENCOUNTER — Other Ambulatory Visit: Payer: Self-pay | Admitting: Nurse Practitioner

## 2021-07-15 DIAGNOSIS — G8929 Other chronic pain: Secondary | ICD-10-CM

## 2021-07-20 ENCOUNTER — Ambulatory Visit: Payer: No Typology Code available for payment source | Admitting: Nurse Practitioner

## 2021-07-23 ENCOUNTER — Other Ambulatory Visit: Payer: Self-pay | Admitting: Internal Medicine

## 2021-07-23 ENCOUNTER — Other Ambulatory Visit: Payer: Self-pay | Admitting: Nurse Practitioner

## 2021-07-23 DIAGNOSIS — J449 Chronic obstructive pulmonary disease, unspecified: Secondary | ICD-10-CM

## 2021-08-07 ENCOUNTER — Other Ambulatory Visit: Payer: Self-pay | Admitting: Nurse Practitioner

## 2021-08-07 DIAGNOSIS — F331 Major depressive disorder, recurrent, moderate: Secondary | ICD-10-CM

## 2021-08-19 NOTE — Progress Notes (Signed)
3 month follow up  Assessment and Plan:   Chronic atrial fibrillation (HCC) Resent CCB, rate controlled, on NOAC, has follow up cardio upcoming  Secondary hypertension - cardiology following, continue medications, DASH diet, exercise and monitor at home. Call if greater than 130/80.   Other pulmonary embolism without acute cor pulmonale, unspecified chronicity ([place]) Stay on CPAP, continue xarelto  Atrial flutter, unspecified type ([place]) Continue medications, rate controlled, on NOAC, follow up cardio  Pulmonary hypertension due to COPD ([place]) Stay on CPAP, weight loss advised  CHF NYHA class III (symptoms with mildly strenuous activities), chronic, diastolic (HCC) Follow up with cardio, monitor weight daily, weight loss advised  OSA and COPD overlap syndrome ([place]) Stay on CPAP, weight loss advised  Chronic obstructive pulmonary disease, unspecified COPD type ([place]) Continue inhalers  Type 2 diabetes mellitus with CKD ([place]) Discussed general issues about diabetes pathophysiology and management., Educational material distributed., Suggested low cholesterol diet., Encouraged aerobic exercise., Discussed foot care., Reminded to get yearly retinal exam. CKD 3b/4 - will not intiate metformin She tried [place] and could not tolerate due to vaginal itching - CBC -     CMP -     Hemoglobin A1c  Hyperlipidemia assocaited with T2DM (HCC) Encourage daily use of Rosuvastatin decrease fatty foods increase activity.  -     Lipid panel  CKD (chronic kidney disease) stage 3b, GFR 44-30 ml/min due to diabetes mellitus(HCC) avoid NSAIDS, control glucose/BP, will monitor. Push fluids throughout the day, increase water intake -     COMPLETE METABOLIC PANEL WITH GFR  Other specified hypothyroidism -check TSH level, continue medications the same, reminded to take on an empty stomach 30-48mns before food.  -     TSH  Depression, major, recurrent, moderate (HCC)/anxiety  - continue  medications, stress management techniques discussed, increase water, good sleep hygiene discussed, increase exercise, and increase veggies.  - Will continue Duloxetine 60 mg daily, Buspar as needed - Insomnia is controlled with Trazodone   Morbid obesity ([place] - follow up 3 months for progress monitoring; she will work on increasing fruit/veggies intake - increase veggies, try to limit snacking - long discussion about weight loss, diet, and exercise - 5 pound weight loss in past 4 months - unable to give herself shots  Vitamin D deficiency Continue Vit D supplementation to maintain value in therapeutic level of 60-100   Gastroesophageal reflux disease, esophagitis presence not specified Continue PPI/H2 blocker, diet discussed  Noncompliance Hx of; improved; will discuss barriers regularly and individualize plan to assist with compliance  Medication management -     Magnesium      Discussed med's effects and SE's. Screening labs and tests as requested with regular follow-up as recommended. Over 30 minutes of exam, counseling, chart review, and complex decision making was performed this visit.  Future Appointments  Date Time [place] 01/15/2022  9:00 AM WAlycia Rossetti NP [facility] None  01/19/2022  1:45 PM MWard Givens NP [facility] None    HPI  61y.o. female  presents for 3 month follow up for T2DM with CKD, hyperlipidemia, htn, vit D def, depression, morbid obesity, hypothyroid.   She is losing her job in August has worked there 469years. She is planning on applying for disability, follow up with cardiology. She doesn't drive, family drives as needed.  Separated from husband since 2009  Youngest son moved out in 03/2020, joined the [employer] [place] 09[place]  [place] for  10 days and is leaving for 4 years to [place].  2 Kids don't talk to her.  Work stress, working from home.  Mood is much better.  She did not start Effexor , was  taking PRN, so was not in her system.   Has been taking Duloxetine 1 pill and Buspar. Mood seems to be controlled She takes naproxen PRN rarely for aches and pains.   BMI is Body mass index is 53.5 kg/m., she has not been  working on diet and exercise.  Wt Readings from Last 3 Encounters:  08/21/21 260 lb 6.4 oz (118.1 kg)  05/08/21 265 lb 3.2 oz (120.3 kg)  01/15/21 264 lb 6.4 oz (119.9 kg)   She has a history of afib/flutter and CHF she is on xarelto,diltiazem 360 mg QD, and Bisoprolol 5 mg QD. She is on lasix 2 a day and zaroxyln 1 x a week and 4-6 potassium a day.   She also has PHTN due to COPD and OSA, she is on CPAP, follows with neurology, endorses 100% compliance. She follows with Dr. Brett Fairy.   She is on trellegy ellipta for COPD, nebs PRN and reports doing well.   Her blood pressure has been controlled at home, today their BP is BP: 118/70  BP Readings from Last 3 Encounters:  08/21/21 118/70  05/08/21 112/72  01/15/21 112/74  She does not workout. She denies chest pain, shortness of breath, dizziness.   She is on cholesterol medication ,prescribed Rosuvastatin but recently has not been taking. . Her cholesterol is not at goal. The cholesterol last visit was:   Lab Results  Component Value Date   CHOL 179 05/08/2021   HDL 40 (L) 05/08/2021   LDLCALC 111 (H) 05/08/2021   TRIG 167 (H) 05/08/2021   CHOLHDL 4.5 05/08/2021   She has been working on diet and exercise for lifestyle managed T2 diabetes that has fortunately remained <7%, she is on bASA, she is on ACE/ARB and denies paresthesia of the feet, polydipsia, polyuria and visual disturbances.  Last A1C in the office was:  Lab Results  Component Value Date   HGBA1C 6.4 (H) 05/08/2021    She has CKD IIIb r/t htn and T2DM monitored here, admits to poor water intake recently-  Lab Results  Component Value Date   GFRNONAA 32 (L) 03/19/2020   GFRNONAA 34 (L) 12/03/2019   GFRNONAA 37 (L) 07/18/2019   She is on  thyroid medication. Her medication was not changed last visit.   Lab Results  Component Value Date   TSH 3.78 05/08/2021   Patient is on Vitamin D supplement and now calcium pills, states she has less back pain. Taking 5000 IU.   Lab Results  Component Value Date   VD25OH 43 01/15/2021        Current Medications:  Current Outpatient Medications on File Prior to Visit  Medication Sig Dispense Refill   albuterol (PROVENTIL) (2.5 MG/3ML) 0.083% nebulizer solution Take 3 mLs (2.5 mg total) by nebulization every 6 (six) hours as needed for wheezing or shortness of breath. 75 mL 12   albuterol (VENTOLIN HFA) 108 (90 Base) MCG/ACT inhaler TAKE 2 PUFFS BY MOUTH EVERY 6 HOURS AS NEEDED FOR WHEEZE OR SHORTNESS OF BREATH 8.5 each 1   aspirin-acetaminophen-caffeine (EXCEDRIN MIGRAINE) 097-353-29 MG per tablet Take 1 tablet by mouth every 6 (six) hours as needed for headache.     bisoprolol (ZEBETA) 5 MG tablet Take  1 tablet  Daily  for BP (Dx: i.10)  90 tablet 3   busPIRone (BUSPAR) 10 MG tablet TAKE 1/2 TO 1 TABLET 3 X /DAY FOR ANXIETY 270 tablet 3   diltiazem (CARDIZEM CD) 360 MG 24 hr capsule Take 1 capsule daily for BP & Heart Rhythm 90 capsule 3   diphenhydrAMINE HCl, Sleep, 50 MG CAPS Take 1 capsule by mouth daily.     DULoxetine (CYMBALTA) 60 MG capsule Take 1 capsule (60 mg total) by mouth daily. 90 capsule 3   Fluticasone-Umeclidin-Vilant (TRELEGY ELLIPTA) 100-62.5-25 MCG/ACT AEPB Inhale 100 mcg into the lungs daily. 60 each 6   furosemide (LASIX) 40 MG tablet TAKE 1 TABLET 2 TO 3 X /DAY AS DIRECTED FOR BP & FLUID RETENTION / ANKLE SWELLING 270 tablet 3   gabapentin (NEURONTIN) 300 MG capsule TAKE 1 CAPSULE 3 X /DAY FOR CHRONIC BACK PAIN 270 capsule 1   ipratropium-albuterol (DUONEB) 0.5-2.5 (3) MG/3ML SOLN USE 1 NEBULE VIA NEBULIZATION EVERY 4 HOURS AS NEEDED (MAX 6 DOSES PER DAY) 540 mL 3   metolazone (ZAROXOLYN) 2.5 MG tablet Take 1 tablet (2.5 mg total) by mouth as needed. 90 tablet 1    potassium chloride (KLOR-CON) 10 MEQ tablet TAKE 2 TABLETS 2 X /DAY FOR POTASSIUM & TAKE ADDITIONAL 2 TABLETS WITH ADDITIONAL DOSE OF LASIX 360 tablet 3   rivaroxaban (XARELTO) 20 MG TABS tablet Take  1 tablet  Daily  to Prevent Blood Clots from Atrial Fibrillation  (Dx:  i48.91) 90 tablet 3   traZODone (DESYREL) 100 MG tablet Take  1 tablet  1 hour  before Bedtime  as needed for Sleep 90 tablet 3   rosuvastatin (CRESTOR) 10 MG tablet Take 1 tablet (10 mg total) by mouth daily. (Patient not taking: Reported on 08/21/2021) 90 tablet 3   No current facility-administered medications on file prior to visit.   Allergies:  Allergies  Allergen Reactions   Sulfonamide Derivatives Hives and Itching   Tikosyn [Dofetilide]     Prolonged qt   Medical History:  She has Depression, major, recurrent, moderate (HCC); OSA and COPD overlap syndrome ([place]); G E R D; CHF (congestive heart failure), NYHA class III, chronic, diastolic ([place]); History of pulmonary embolus ([place]); Atrial flutter (HCC); COPD (chronic obstructive pulmonary disease) ([place]); Atrial fibrillation ([place]); Hypothyroid; Hyperlipidemia associated with type 2 diabetes mellitus ([place]); HTN (hypertension); Type 2 diabetes with kidney complications ([place]); Anxiety; Vitamin D deficiency; Morbid obesity ([place]); Noncompliance; CKD stage G3b/A1, GFR 30-44 and albumin creatinine ratio <30 mg/g ([place]); Pulmonary hypertension due to COPD ([place]); Obstructive sleep apnea treated with continuous positive airway pressure (CPAP); Constipation; and Benign hypertensive heart and kidney disease with diastolic CHF, NYHA class 3 and CKD stage 3 ([place]) on their problem list.   Surgical History:  She has a past surgical history that includes Tubal ligation (1996); TEE without cardioversion (01/19/2012); Cardioversion (01/19/2012); Cardioversion (01/21/2012); and basal skin can. Family History:  Herfamily history includes Allergies in her father; Asthma in her father; Colon cancer  in her mother; Coronary artery disease in her father and mother; Depression in her daughter; Emphysema in her father; Heart attack in her maternal grandfather; Heart disease in her maternal grandmother. Social History:  She reports that she quit smoking about 11 years ago. Her smoking use included cigarettes. She has a 70.00 pack-year smoking history. She has never used smokeless tobacco. She reports that she does not drink alcohol and does not use drugs.  Review of Systems: Review of Systems  Constitutional: Negative.  Negative for malaise/fatigue and weight loss.  HENT: Negative.  Negative for hearing loss and tinnitus.   Eyes: Negative.  Negative for blurred vision and double vision.  Respiratory: Negative.  Negative for cough, sputum production, shortness of breath and wheezing.   Cardiovascular: Negative.  Negative for chest pain, palpitations, orthopnea, claudication, leg swelling and PND.  Gastrointestinal: Negative.  Negative for abdominal pain, blood in stool, constipation, diarrhea, heartburn, melena, nausea and vomiting.  Genitourinary: Negative.   Musculoskeletal:  Positive for joint pain (intermittent hips, back). Negative for falls and myalgias.  Skin: Negative.  Negative for rash.  Neurological:  Negative for dizziness, tingling, sensory change, weakness and headaches.  Endo/Heme/Allergies:  Negative for polydipsia.  Psychiatric/Behavioral:  Positive for depression. Negative for memory loss, substance abuse and suicidal ideas. The patient is not nervous/anxious and does not have insomnia.   All other systems reviewed and are negative.   Physical Exam: Estimated body mass index is 53.5 kg/m as calculated from the following:   Height as of this encounter: 4' 10.5" (1.486 m).   Weight as of this encounter: 260 lb 6.4 oz (118.1 kg).  BP 118/70   Pulse 80   Temp 97.9 F (36.6 C)   Resp 18   Ht 4' 10.5" (1.486 m)   Wt 260 lb 6.4 oz (118.1 kg)   SpO2 96%   BMI 53.50 kg/m   General Appearance: Well dressed, moribdly obese, in no apparent distress. Eyes: PERRLA, EOMs, conjunctiva no swelling or erythema Sinuses: No Frontal/maxillary tenderness ENT/Mouth: Ext aud canals clear, TMs without erythema, bulging. No erythema, swelling, or exudate on post pharynx.Tonsils not swollen or erythematous. Hearing normal. Crowded mouth Neck: Supple, thyroid normal.  Respiratory: Decreased breath sounds bilateral, with wheezing and decreased breath sounds bilateral lower lobes Cardio: Irreg, irreg,  No murmur, 1+ nonpitting edema of ankles/feet Abdomen: Soft, + BS, Morbidly obese,  Non tender, no guarding, rebound, no palpable hernias, masses. Lymphatics: Non tender without lymphadenopathy.  Musculoskeletal: Body habitus limits exam, symmetrical ROM, 5/5 strength, antalgic gait with cane Skin: warm, dry intact, without notable rash, lesions, ecchymosis. Bilateral feet thickened long toenail no ulcers. Has very thick callouses bil Neuro: Cranial nerves intact. No cerebellar symptoms. Sensation intact.  Psych: Awake and oriented X 3,  normal affect, Insight and Judgment appropriate.    Alycia Rossetti 9:43 AM [facility]

## 2021-08-21 ENCOUNTER — Encounter: Payer: Self-pay | Admitting: Nurse Practitioner

## 2021-08-21 ENCOUNTER — Ambulatory Visit (INDEPENDENT_AMBULATORY_CARE_PROVIDER_SITE_OTHER): Payer: No Typology Code available for payment source | Admitting: Nurse Practitioner

## 2021-08-21 VITALS — BP 118/70 | HR 80 | Temp 97.9°F | Resp 18 | Ht 58.5 in | Wt 260.4 lb

## 2021-08-21 DIAGNOSIS — Z79899 Other long term (current) drug therapy: Secondary | ICD-10-CM

## 2021-08-21 DIAGNOSIS — I159 Secondary hypertension, unspecified: Secondary | ICD-10-CM

## 2021-08-21 DIAGNOSIS — N1832 Chronic kidney disease, stage 3b: Secondary | ICD-10-CM

## 2021-08-21 DIAGNOSIS — E1169 Type 2 diabetes mellitus with other specified complication: Secondary | ICD-10-CM

## 2021-08-21 DIAGNOSIS — J449 Chronic obstructive pulmonary disease, unspecified: Secondary | ICD-10-CM | POA: Diagnosis not present

## 2021-08-21 DIAGNOSIS — I2723 Pulmonary hypertension due to lung diseases and hypoxia: Secondary | ICD-10-CM

## 2021-08-21 DIAGNOSIS — Z86711 Personal history of pulmonary embolism: Secondary | ICD-10-CM | POA: Diagnosis not present

## 2021-08-21 DIAGNOSIS — Z91199 Patient's noncompliance with other medical treatment and regimen due to unspecified reason: Secondary | ICD-10-CM

## 2021-08-21 DIAGNOSIS — E039 Hypothyroidism, unspecified: Secondary | ICD-10-CM

## 2021-08-21 DIAGNOSIS — I4892 Unspecified atrial flutter: Secondary | ICD-10-CM | POA: Diagnosis not present

## 2021-08-21 DIAGNOSIS — E559 Vitamin D deficiency, unspecified: Secondary | ICD-10-CM

## 2021-08-21 DIAGNOSIS — F331 Major depressive disorder, recurrent, moderate: Secondary | ICD-10-CM

## 2021-08-21 DIAGNOSIS — K219 Gastro-esophageal reflux disease without esophagitis: Secondary | ICD-10-CM

## 2021-08-21 DIAGNOSIS — E1122 Type 2 diabetes mellitus with diabetic chronic kidney disease: Secondary | ICD-10-CM

## 2021-08-21 DIAGNOSIS — E785 Hyperlipidemia, unspecified: Secondary | ICD-10-CM

## 2021-08-21 DIAGNOSIS — I4819 Other persistent atrial fibrillation: Secondary | ICD-10-CM

## 2021-08-21 DIAGNOSIS — G4733 Obstructive sleep apnea (adult) (pediatric): Secondary | ICD-10-CM

## 2021-08-21 DIAGNOSIS — N183 Chronic kidney disease, stage 3 unspecified: Secondary | ICD-10-CM

## 2021-08-21 DIAGNOSIS — I5032 Chronic diastolic (congestive) heart failure: Secondary | ICD-10-CM

## 2021-08-22 LAB — TSH: TSH: 2.66 mIU/L (ref 0.40–4.50)

## 2021-08-22 LAB — COMPLETE METABOLIC PANEL WITH GFR
AG Ratio: 1.5 (calc) (ref 1.0–2.5)
ALT: 26 U/L (ref 6–29)
AST: 16 U/L (ref 10–35)
Albumin: 3.9 g/dL (ref 3.6–5.1)
Alkaline phosphatase (APISO): 86 U/L (ref 37–153)
BUN/Creatinine Ratio: 15 (calc) (ref 6–22)
BUN: 21 mg/dL (ref 7–25)
CO2: 35 mmol/L — ABNORMAL HIGH (ref 20–32)
Calcium: 8.7 mg/dL (ref 8.6–10.4)
Chloride: 98 mmol/L (ref 98–110)
Creat: 1.4 mg/dL — ABNORMAL HIGH (ref 0.50–1.05)
Globulin: 2.6 g/dL (calc) (ref 1.9–3.7)
Glucose, Bld: 132 mg/dL — ABNORMAL HIGH (ref 65–99)
Potassium: 3.2 mmol/L — ABNORMAL LOW (ref 3.5–5.3)
Sodium: 143 mmol/L (ref 135–146)
Total Bilirubin: 0.5 mg/dL (ref 0.2–1.2)
Total Protein: 6.5 g/dL (ref 6.1–8.1)
eGFR: 43 mL/min/{1.73_m2} — ABNORMAL LOW (ref >=60)

## 2021-08-22 LAB — LIPID PANEL
Cholesterol: 220 mg/dL — ABNORMAL HIGH (ref <200)
HDL: 43 mg/dL — ABNORMAL LOW (ref >=50)
LDL Cholesterol (Calc): 146 mg/dL (calc) — ABNORMAL HIGH
Non-HDL Cholesterol (Calc): 177 mg/dL (calc) — ABNORMAL HIGH (ref <130)
Total CHOL/HDL Ratio: 5.1 (calc) — ABNORMAL HIGH (ref <5.0)
Triglycerides: 178 mg/dL — ABNORMAL HIGH (ref <150)

## 2021-08-22 LAB — HEMOGLOBIN A1C
Hgb A1c MFr Bld: 6.6 % of total Hgb — ABNORMAL HIGH (ref <5.7)
Mean Plasma Glucose: 143 mg/dL
eAG (mmol/L): 7.9 mmol/L

## 2021-08-22 LAB — CBC WITH DIFFERENTIAL/PLATELET
Absolute Monocytes: 433 cells/uL (ref 200–950)
Basophils Absolute: 28 cells/uL (ref 0–200)
Basophils Relative: 0.4 %
Eosinophils Absolute: 28 cells/uL (ref 15–500)
Eosinophils Relative: 0.4 %
HCT: 39.3 % (ref 35.0–45.0)
Hemoglobin: 13.2 g/dL (ref 11.7–15.5)
Lymphs Abs: 1612 cells/uL (ref 850–3900)
MCH: 29.3 pg (ref 27.0–33.0)
MCHC: 33.6 g/dL (ref 32.0–36.0)
MCV: 87.3 fL (ref 80.0–100.0)
MPV: 11.8 fL (ref 7.5–12.5)
Monocytes Relative: 6.1 %
Neutro Abs: 4998 cells/uL (ref 1500–7800)
Neutrophils Relative %: 70.4 %
Platelets: 228 10*3/uL (ref 140–400)
RBC: 4.5 10*6/uL (ref 3.80–5.10)
RDW: 13.8 % (ref 11.0–15.0)
Total Lymphocyte: 22.7 %
WBC: 7.1 10*3/uL (ref 3.8–10.8)

## 2021-08-22 LAB — MAGNESIUM: Magnesium: 2 mg/dL (ref 1.5–2.5)

## 2021-08-29 ENCOUNTER — Other Ambulatory Visit: Payer: Self-pay | Admitting: Nurse Practitioner

## 2021-08-29 DIAGNOSIS — I5032 Chronic diastolic (congestive) heart failure: Secondary | ICD-10-CM

## 2021-08-29 DIAGNOSIS — I159 Secondary hypertension, unspecified: Secondary | ICD-10-CM

## 2021-09-03 ENCOUNTER — Ambulatory Visit (INDEPENDENT_AMBULATORY_CARE_PROVIDER_SITE_OTHER): Payer: No Typology Code available for payment source

## 2021-09-03 VITALS — BP 108/69 | HR 65 | Temp 96.9°F | Ht 58.5 in | Wt 258.0 lb

## 2021-09-03 DIAGNOSIS — Z79899 Other long term (current) drug therapy: Secondary | ICD-10-CM

## 2021-09-03 NOTE — Progress Notes (Signed)
Patient presents to the office for a nurse visit to have labs done to re-check Potassium levels. Blood pressure 108/69, pulse 65, temperature (!) 96.9 F (36.1 C), height 4' 10.5" (1.486 m), weight 258 lb (117 kg), SpO2 94 %. No questions or concerns.

## 2021-09-03 NOTE — Progress Notes (Deleted)
   Acute Office Visit  Subjective:     Patient ID: [name], female    DOB: 30-Oct-1960, 61 y.o.   MRN: [id number]  Chief Complaint  Patient presents with   Labs Only    Potassium  re-check    HPI Patient is in today for labs to recheck her Potassium  ROS      Objective:    BP 108/69   Pulse 65   Temp (!) 96.9 F (36.1 C)   Ht 4' 10.5" (1.486 m)   Wt 258 lb (117 kg)   SpO2 94%   BMI 53.00 kg/m  {Vitals History (Optional):23777}        Assessment & Plan:   Problem List Items Addressed This Visit   None Visit Diagnoses     Medication management    -  Primary   Relevant Orders   Basic metabolic panel       No orders of the defined types were placed in this encounter.   No follow-ups on file.  Chancy Hurter, CMA

## 2021-09-04 ENCOUNTER — Other Ambulatory Visit: Payer: Self-pay | Admitting: Nurse Practitioner

## 2021-09-04 DIAGNOSIS — E876 Hypokalemia: Secondary | ICD-10-CM

## 2021-09-04 DIAGNOSIS — Z79899 Other long term (current) drug therapy: Secondary | ICD-10-CM

## 2021-09-04 LAB — BASIC METABOLIC PANEL
BUN/Creatinine Ratio: 19 (calc) (ref 6–22)
BUN: 34 mg/dL — ABNORMAL HIGH (ref 7–25)
CO2: 35 mmol/L — ABNORMAL HIGH (ref 20–32)
Calcium: 10.5 mg/dL — ABNORMAL HIGH (ref 8.6–10.4)
Chloride: 91 mmol/L — ABNORMAL LOW (ref 98–110)
Creat: 1.82 mg/dL — ABNORMAL HIGH (ref 0.50–1.05)
Glucose, Bld: 155 mg/dL — ABNORMAL HIGH (ref 65–99)
Potassium: 2.8 mmol/L — ABNORMAL LOW (ref 3.5–5.3)
Sodium: 140 mmol/L (ref 135–146)

## 2021-09-08 ENCOUNTER — Other Ambulatory Visit: Payer: Self-pay | Admitting: Nurse Practitioner

## 2021-09-08 DIAGNOSIS — F331 Major depressive disorder, recurrent, moderate: Secondary | ICD-10-CM

## 2021-09-08 DIAGNOSIS — I5032 Chronic diastolic (congestive) heart failure: Secondary | ICD-10-CM

## 2021-09-10 ENCOUNTER — Other Ambulatory Visit: Payer: No Typology Code available for payment source

## 2021-09-10 ENCOUNTER — Other Ambulatory Visit: Payer: Self-pay

## 2021-09-10 DIAGNOSIS — E876 Hypokalemia: Secondary | ICD-10-CM

## 2021-09-10 DIAGNOSIS — Z79899 Other long term (current) drug therapy: Secondary | ICD-10-CM

## 2021-09-11 LAB — BASIC METABOLIC PANEL WITH GFR
BUN/Creatinine Ratio: 10 (calc) (ref 6–22)
BUN: 15 mg/dL (ref 7–25)
CO2: 29 mmol/L (ref 20–32)
Calcium: 8.5 mg/dL — ABNORMAL LOW (ref 8.6–10.4)
Chloride: 104 mmol/L (ref 98–110)
Creat: 1.44 mg/dL — ABNORMAL HIGH (ref 0.50–1.05)
Glucose, Bld: 152 mg/dL — ABNORMAL HIGH (ref 65–99)
Potassium: 3.5 mmol/L (ref 3.5–5.3)
Sodium: 144 mmol/L (ref 135–146)
eGFR: 41 mL/min/{1.73_m2} — ABNORMAL LOW (ref >=60)

## 2021-09-11 LAB — MAGNESIUM: Magnesium: 2.1 mg/dL (ref 1.5–2.5)

## 2021-11-24 ENCOUNTER — Other Ambulatory Visit: Payer: Self-pay | Admitting: Nurse Practitioner

## 2021-11-24 DIAGNOSIS — I5032 Chronic diastolic (congestive) heart failure: Secondary | ICD-10-CM

## 2021-11-25 ENCOUNTER — Ambulatory Visit: Payer: Commercial Managed Care - HMO | Admitting: Nurse Practitioner

## 2021-11-25 NOTE — Progress Notes (Signed)
3 month follow up  Assessment and Plan:   Chronic atrial fibrillation (HCC) Resent CCB, rate controlled, on NOAC, has follow up cardio upcoming  Secondary hypertension - cardiology following, continue medications, DASH diet, exercise and monitor at home. Call if greater than 130/80.   Other pulmonary embolism without acute cor pulmonale, unspecified chronicity ([place]) Stay on CPAP, continue xarelto  Atrial flutter, unspecified type ([place]) Continue medications, rate controlled, on NOAC, follow up cardio  Pulmonary hypertension due to COPD ([place]) Stay on CPAP, weight loss advised  CHF NYHA class III (symptoms with mildly strenuous activities), chronic, diastolic (HCC) Follow up with cardio, monitor weight daily, weight loss advised  OSA and COPD overlap syndrome ([place]) Stay on CPAP, weight loss advised  Chronic obstructive pulmonary disease, unspecified COPD type ([place]) Continue inhalers  Type 2 diabetes mellitus with CKD ([place]) Discussed general issues about diabetes pathophysiology and management., Educational material distributed., Suggested low cholesterol diet., Encouraged aerobic exercise., Discussed foot care., Reminded to get yearly retinal exam. CKD 3b/4 - will not intiate metformin [name] tried [place] and could not tolerate due to vaginal itching - CBC -     CMP -     Hemoglobin A1c  Hyperlipidemia assocaited with T2DM (HCC) Encourage daily use of Rosuvastatin decrease fatty foods increase activity.  -     Lipid panel  CKD (chronic kidney disease) stage 3b, GFR 44-30 ml/min due to diabetes mellitus(HCC) avoid NSAIDS, control glucose/BP, will monitor. Push fluids throughout the day, increase water intake -     COMPLETE METABOLIC PANEL WITH GFR  Other specified hypothyroidism -check TSH level, continue medications the same, reminded to take on an empty stomach 30-69mns before food.  -     TSH  Depression, major, recurrent, moderate (HCC)/anxiety  - continue  medications, stress management techniques discussed, increase water, good sleep hygiene discussed, increase exercise, and increase veggies.  - Will continue Duloxetine 60 mg daily, Buspar as needed - Insomnia is controlled with Trazodone   Morbid obesity ([place] - follow up 3 months for progress monitoring; [name] will work on increasing fruit/veggies intake - increase veggies, try to limit snacking - long discussion about weight loss, diet, and exercise - 6 pound weight loss in past 3 months - unable to give herself shots  Vitamin D deficiency Continue Vit D supplementation to maintain value in therapeutic level of 60-100   Gastroesophageal reflux disease, esophagitis presence not specified Continue PPI/H2 blocker, diet discussed  Noncompliance Hx of; improved; will discuss barriers regularly and individualize plan to assist with compliance  Medication management -     Magnesium  Flu vaccine need - Flu Vaccine Quad 6+ MOS PR IM      Discussed med's effects and SE's. Screening labs and tests as requested with regular follow-up as recommended. Over 30 minutes of exam, counseling, chart review, and complex decision making was performed this visit.  Future Appointments  Date Time Provider [place] 01/15/2022  9:00 AM WAlycia Rossetti NP [id number] None  01/19/2022  1:45 PM MWard Givens NP [facility] None    HPI  61y.o. female  presents for 3 month follow up for T2DM with CKD, hyperlipidemia, htn, vit D def, depression, morbid obesity, hypothyroid.   [name] is lost her job in August had worked there 416years. [name] is planning on applying for disability, follow up with cardiology. [name] doesn't drive, family drives as needed.  Separated from husband since 2009   Work stress, working from home.  Mood is much  better.   Has been taking Duloxetine 1 pill and Buspar. Mood seems to be controlled [name] takes naproxen PRN rarely for aches and pains.   BMI is Body mass index is 51.77  kg/m., [name] has not been  working on diet and exercise. [name] is eating 1 big meal day. [name] has not been exercising Wt Readings from Last 3 Encounters:  11/27/21 252 lb (114.3 kg)  09/03/21 258 lb (117 kg)  08/21/21 260 lb 6.4 oz (118.1 kg)   [name] has a history of afib/flutter and CHF [name] is on xarelto,diltiazem 360 mg QD, and Bisoprolol 5 mg QD. [name] is on lasix 2 a day and zaroxyln 1 x a week and 4-6 potassium a day.   [name] also has PHTN due to COPD and OSA, [name] is on CPAP, follows with neurology, endorses 100% compliance. [name] follows with Dr. Brett Fairy.   [name] is on trellegy ellipta for COPD, nebs PRN and reports doing well.   Her blood pressure has been controlled at home, today their BP is BP: 132/60  BP Readings from Last 3 Encounters:  11/27/21 132/60  09/03/21 108/69  08/21/21 118/70  [name] does not workout. [name] denies chest pain, shortness of breath, dizziness.   [name] is on cholesterol medication ,prescribed Rosuvastatin but recently has not been taking. . Her cholesterol is not at goal. The cholesterol last visit was:   Lab Results  Component Value Date   CHOL 220 (H) 08/21/2021   HDL 43 (L) 08/21/2021   LDLCALC 146 (H) 08/21/2021   TRIG 178 (H) 08/21/2021   CHOLHDL 5.1 (H) 08/21/2021   [name] has been working on diet and exercise for lifestyle managed T2 diabetes that has fortunately remained <7%, [name] is on bASA, [name] is on ACE/ARB and denies paresthesia of the feet, polydipsia, polyuria and visual disturbances.  [name] does not check her blood sugar- Has been decreasing simple carbs Last A1C in the office was:  Lab Results  Component Value Date   HGBA1C 6.6 (H) 08/21/2021    [name] has CKD IIIb r/t htn and T2DM monitored here, admits to poor water intake recently-  Lab Results  Component Value Date   EGFR 41 (L) 09/10/2021    [name] is on thyroid medication. Her medication was not changed last visit.   Lab Results  Component Value Date   TSH 2.66 08/21/2021   Patient is on Vitamin  D supplement and now calcium pills, states [name] has less back pain. Taking 5000 IU.   Lab Results  Component Value Date   VD25OH 43 01/15/2021        Current Medications:  Current Outpatient Medications on File Prior to Visit  Medication Sig Dispense Refill   albuterol (PROVENTIL) (2.5 MG/3ML) 0.083% nebulizer solution Take 3 mLs (2.5 mg total) by nebulization every 6 (six) hours as needed for wheezing or shortness of breath. 75 mL 12   albuterol (VENTOLIN HFA) 108 (90 Base) MCG/ACT inhaler TAKE 2 PUFFS BY MOUTH EVERY 6 HOURS AS NEEDED FOR WHEEZE OR SHORTNESS OF BREATH 8.5 each 1   aspirin-acetaminophen-caffeine (EXCEDRIN MIGRAINE) 646-803-21 MG per tablet Take 1 tablet by mouth every 6 (six) hours as needed for headache.     BISOPROLOL FUMARATE PO Take 5 mg by mouth daily.     busPIRone (BUSPAR) 10 MG tablet TAKE 1/2 TO 1 TABLET 3 X /DAY FOR ANXIETY 270 tablet 3   diltiazem (CARDIZEM CD) 360 MG 24 hr capsule Take 1 capsule daily for BP & Heart  Rhythm 90 capsule 3   diphenhydrAMINE HCl, Sleep, 50 MG CAPS Take 1 capsule by mouth daily.     DULoxetine (CYMBALTA) 60 MG capsule Take 1 capsule (60 mg total) by mouth daily. (Patient taking differently: Take 60 mg by mouth daily.  Takes 1/2 tablet prn) 90 capsule 3   Fluticasone-Umeclidin-Vilant (TRELEGY ELLIPTA) 100-62.5-25 MCG/ACT AEPB Inhale 100 mcg into the lungs daily. 60 each 6   furosemide (LASIX) 40 MG tablet TAKE 1 TABLET 2 TO 3 X /DAY AS DIRECTED FOR BP & FLUID RETENTION / ANKLE SWELLING 270 tablet 3   gabapentin (NEURONTIN) 300 MG capsule TAKE 1 CAPSULE 3 X /DAY FOR CHRONIC BACK PAIN 270 capsule 1   ipratropium-albuterol (DUONEB) 0.5-2.5 (3) MG/3ML SOLN USE 1 NEBULE VIA NEBULIZATION EVERY 4 HOURS AS NEEDED (MAX 6 DOSES PER DAY) 540 mL 3   metolazone (ZAROXOLYN) 2.5 MG tablet TAKE 1 TABLET (2.5 MG TOTAL) BY MOUTH AS NEEDED. 90 tablet 1   potassium chloride (KLOR-CON) 10 MEQ tablet TAKE 2 TABLETS 2 X /DAY FOR POTASSIUM & TAKE ADDITIONAL 2  TABLETS WITH ADDITIONAL DOSE OF LASIX 360 tablet 3   rivaroxaban (XARELTO) 20 MG TABS tablet Take  1 tablet  Daily  to Prevent Blood Clots from Atrial Fibrillation  (Dx:  i48.91) 90 tablet 3   rosuvastatin (CRESTOR) 10 MG tablet Take 1 tablet (10 mg total) by mouth daily. 90 tablet 3   traZODone (DESYREL) 100 MG tablet Take  1 tablet  1 hour  before Bedtime  as needed for Sleep 90 tablet 3   No current facility-administered medications on file prior to visit.   Allergies:  Allergies  Allergen Reactions   Sulfonamide Derivatives Hives and Itching   Tikosyn [Dofetilide]     Prolonged qt   Medical History:  [name] has Depression, major, recurrent, moderate (HCC); OSA and COPD overlap syndrome ([place]); G E R D; CHF (congestive heart failure), NYHA class III, chronic, diastolic ([place]); History of pulmonary embolus ([place]); Atrial flutter (HCC); COPD (chronic obstructive pulmonary disease) ([place]); Atrial fibrillation ([place]); Hypothyroid; Hyperlipidemia associated with type 2 diabetes mellitus ([place]); HTN (hypertension); Type 2 diabetes with kidney complications ([place]); Anxiety; Vitamin D deficiency; Morbid obesity ([place]); Noncompliance; CKD stage G3b/A1, GFR 30-44 and albumin creatinine ratio <30 mg/g ([place]); Pulmonary hypertension due to COPD ([place]); Obstructive sleep apnea treated with continuous positive airway pressure (CPAP); Constipation; and Benign hypertensive heart and kidney disease with diastolic CHF, NYHA class 3 and CKD stage 3 ([employer]) on their problem list.   Surgical History:  [name] has a past surgical history that includes Tubal ligation (1996); TEE without cardioversion (01/19/2012); Cardioversion (01/19/2012); Cardioversion (01/21/2012); and basal skin can. Family History:  Herfamily history includes Allergies in her father; Asthma in her father; Colon cancer in her mother; Coronary artery disease in her father and mother; Depression in her daughter; Emphysema in her father; Heart attack in her maternal  grandfather; Heart disease in her maternal grandmother. Social History:  [name] reports that [name] quit smoking about 12 years ago. Her smoking use included cigarettes. [name] has a 70.00 pack-year smoking history. [name] has never used smokeless tobacco. [name] reports that [name] does not drink alcohol and does not use drugs.  Review of Systems: Review of Systems  Constitutional: Negative.  Negative for malaise/fatigue and weight loss.  HENT: Negative.  Negative for hearing loss and tinnitus.   Eyes: Negative.  Negative for blurred vision and double vision.  Respiratory: Negative.  Negative for cough, sputum production, shortness of  breath and wheezing.   Cardiovascular: Negative.  Negative for chest pain, palpitations, orthopnea, claudication, leg swelling and PND.  Gastrointestinal: Negative.  Negative for abdominal pain, blood in stool, constipation, diarrhea, heartburn, melena, nausea and vomiting.  Genitourinary: Negative.   Musculoskeletal:  Positive for joint pain (intermittent hips, back). Negative for falls and myalgias.  Skin: Negative.  Negative for rash.  Neurological:  Negative for dizziness, tingling, sensory change, weakness and headaches.  Endo/Heme/Allergies:  Negative for polydipsia.  Psychiatric/Behavioral:  Positive for depression. Negative for memory loss, substance abuse and suicidal ideas. The patient is not nervous/anxious and does not have insomnia.   All other systems reviewed and are negative.   Physical Exam: Estimated body mass index is 51.77 kg/m as calculated from the following:   Height as of this encounter: 4' 10.5" (1.486 m).   Weight as of this encounter: 252 lb (114.3 kg).  BP 132/60   Pulse 79   Temp (!) 97.3 F (36.3 C)   Ht 4' 10.5" (1.486 m)   Wt 252 lb (114.3 kg)   SpO2 92%   BMI 51.77 kg/m  General Appearance: Well dressed, moribdly obese, in no apparent distress. Eyes: PERRLA, EOMs, conjunctiva no swelling or erythema Sinuses: No Frontal/maxillary  tenderness ENT/Mouth: Ext aud canals clear, TMs without erythema, bulging. No erythema, swelling, or exudate on post pharynx.Tonsils not swollen or erythematous. Hearing normal. Crowded mouth Neck: Supple, thyroid normal.  Respiratory: Decreased breath sounds bilateral, with wheezing and decreased breath sounds bilateral lower lobes Cardio: Irreg, irreg,  No murmur, 1+ nonpitting edema of ankles/feet Abdomen: Soft, + BS, Morbidly obese,  Non tender, no guarding, rebound, no palpable hernias, masses. Lymphatics: Non tender without lymphadenopathy.  Musculoskeletal: Body habitus limits exam, symmetrical ROM, 5/5 strength, antalgic gait with cane Skin: warm, dry intact, without notable rash, lesions, ecchymosis. Bilateral feet thickened long toenail no ulcers. Has very thick callouses bil Neuro: Cranial nerves intact. No cerebellar symptoms. Sensation intact.  Psych: Awake and oriented X 3,  normal affect, Insight and Judgment appropriate.    [name] 9:47 AM [facility]

## 2021-11-27 ENCOUNTER — Encounter: Payer: Self-pay | Admitting: Nurse Practitioner

## 2021-11-27 ENCOUNTER — Ambulatory Visit (INDEPENDENT_AMBULATORY_CARE_PROVIDER_SITE_OTHER): Payer: Commercial Managed Care - HMO | Admitting: Nurse Practitioner

## 2021-11-27 VITALS — BP 132/60 | HR 79 | Temp 97.3°F | Ht 58.5 in | Wt 252.0 lb

## 2021-11-27 DIAGNOSIS — Z79899 Other long term (current) drug therapy: Secondary | ICD-10-CM | POA: Diagnosis not present

## 2021-11-27 DIAGNOSIS — N1832 Chronic kidney disease, stage 3b: Secondary | ICD-10-CM

## 2021-11-27 DIAGNOSIS — G4733 Obstructive sleep apnea (adult) (pediatric): Secondary | ICD-10-CM

## 2021-11-27 DIAGNOSIS — I4892 Unspecified atrial flutter: Secondary | ICD-10-CM | POA: Diagnosis not present

## 2021-11-27 DIAGNOSIS — I2723 Pulmonary hypertension due to lung diseases and hypoxia: Secondary | ICD-10-CM

## 2021-11-27 DIAGNOSIS — J449 Chronic obstructive pulmonary disease, unspecified: Secondary | ICD-10-CM

## 2021-11-27 DIAGNOSIS — E1122 Type 2 diabetes mellitus with diabetic chronic kidney disease: Secondary | ICD-10-CM | POA: Diagnosis not present

## 2021-11-27 DIAGNOSIS — E039 Hypothyroidism, unspecified: Secondary | ICD-10-CM

## 2021-11-27 DIAGNOSIS — E785 Hyperlipidemia, unspecified: Secondary | ICD-10-CM | POA: Diagnosis not present

## 2021-11-27 DIAGNOSIS — E1169 Type 2 diabetes mellitus with other specified complication: Secondary | ICD-10-CM | POA: Diagnosis not present

## 2021-11-27 DIAGNOSIS — N183 Chronic kidney disease, stage 3 unspecified: Secondary | ICD-10-CM

## 2021-11-27 DIAGNOSIS — E559 Vitamin D deficiency, unspecified: Secondary | ICD-10-CM

## 2021-11-27 DIAGNOSIS — I5032 Chronic diastolic (congestive) heart failure: Secondary | ICD-10-CM

## 2021-11-27 DIAGNOSIS — Z91199 Patient's noncompliance with other medical treatment and regimen due to unspecified reason: Secondary | ICD-10-CM

## 2021-11-27 DIAGNOSIS — Z23 Encounter for immunization: Secondary | ICD-10-CM | POA: Diagnosis not present

## 2021-11-27 DIAGNOSIS — F331 Major depressive disorder, recurrent, moderate: Secondary | ICD-10-CM

## 2021-11-27 DIAGNOSIS — Z86711 Personal history of pulmonary embolism: Secondary | ICD-10-CM

## 2021-11-27 DIAGNOSIS — I4819 Other persistent atrial fibrillation: Secondary | ICD-10-CM | POA: Diagnosis not present

## 2021-11-27 DIAGNOSIS — K219 Gastro-esophageal reflux disease without esophagitis: Secondary | ICD-10-CM

## 2021-11-27 NOTE — Patient Instructions (Signed)

## 2021-11-28 LAB — COMPLETE METABOLIC PANEL WITH GFR
AG Ratio: 1.7 (calc) (ref 1.0–2.5)
ALT: 26 U/L (ref 6–29)
AST: 18 U/L (ref 10–35)
Albumin: 4.5 g/dL (ref 3.6–5.1)
Alkaline phosphatase (APISO): 94 U/L (ref 37–153)
BUN/Creatinine Ratio: 20 (calc) (ref 6–22)
BUN: 29 mg/dL — ABNORMAL HIGH (ref 7–25)
CO2: 35 mmol/L — ABNORMAL HIGH (ref 20–32)
Calcium: 9.6 mg/dL (ref 8.6–10.4)
Chloride: 96 mmol/L — ABNORMAL LOW (ref 98–110)
Creat: 1.48 mg/dL — ABNORMAL HIGH (ref 0.50–1.05)
Globulin: 2.7 g/dL (calc) (ref 1.9–3.7)
Glucose, Bld: 149 mg/dL — ABNORMAL HIGH (ref 65–99)
Potassium: 3.1 mmol/L — ABNORMAL LOW (ref 3.5–5.3)
Sodium: 143 mmol/L (ref 135–146)
Total Bilirubin: 0.8 mg/dL (ref 0.2–1.2)
Total Protein: 7.2 g/dL (ref 6.1–8.1)
eGFR: 40 mL/min/{1.73_m2} — ABNORMAL LOW (ref >=60)

## 2021-11-28 LAB — CBC WITH DIFFERENTIAL/PLATELET
Absolute Monocytes: 462 cells/uL (ref 200–950)
Basophils Absolute: 32 cells/uL (ref 0–200)
Basophils Relative: 0.4 %
Eosinophils Absolute: 49 cells/uL (ref 15–500)
Eosinophils Relative: 0.6 %
HCT: 43.7 % (ref 35.0–45.0)
Hemoglobin: 14.6 g/dL (ref 11.7–15.5)
Lymphs Abs: 1620 cells/uL (ref 850–3900)
MCH: 29.4 pg (ref 27.0–33.0)
MCHC: 33.4 g/dL (ref 32.0–36.0)
MCV: 88.1 fL (ref 80.0–100.0)
MPV: 11.7 fL (ref 7.5–12.5)
Monocytes Relative: 5.7 %
Neutro Abs: 5937 cells/uL (ref 1500–7800)
Neutrophils Relative %: 73.3 %
Platelets: 224 10*3/uL (ref 140–400)
RBC: 4.96 10*6/uL (ref 3.80–5.10)
RDW: 13 % (ref 11.0–15.0)
Total Lymphocyte: 20 %
WBC: 8.1 10*3/uL (ref 3.8–10.8)

## 2021-11-28 LAB — LIPID PANEL
Cholesterol: 143 mg/dL (ref <200)
HDL: 43 mg/dL — ABNORMAL LOW (ref >=50)
LDL Cholesterol (Calc): 75 mg/dL (calc)
Non-HDL Cholesterol (Calc): 100 mg/dL (calc) (ref <130)
Total CHOL/HDL Ratio: 3.3 (calc) (ref <5.0)
Triglycerides: 158 mg/dL — ABNORMAL HIGH (ref <150)

## 2021-11-28 LAB — HEMOGLOBIN A1C
Hgb A1c MFr Bld: 7.2 % of total Hgb — ABNORMAL HIGH (ref <5.7)
Mean Plasma Glucose: 160 mg/dL
eAG (mmol/L): 8.9 mmol/L

## 2021-11-28 LAB — MAGNESIUM: Magnesium: 2 mg/dL (ref 1.5–2.5)

## 2021-11-28 LAB — TSH: TSH: 5.07 mIU/L — ABNORMAL HIGH (ref 0.40–4.50)

## 2021-12-01 ENCOUNTER — Telehealth: Payer: Self-pay

## 2021-12-01 NOTE — Telephone Encounter (Signed)
Prior auth completed and submitted. Has been approved through 12/01/22

## 2022-01-15 ENCOUNTER — Encounter: Payer: Managed Care, Other (non HMO) | Admitting: Nurse Practitioner

## 2022-01-18 ENCOUNTER — Encounter: Payer: Self-pay | Admitting: *Deleted

## 2022-01-18 NOTE — Progress Notes (Unsigned)
PATIENT: [name] DOB: 12-14-1960  REASON FOR VISIT: follow up HISTORY FROM: patient PRIMARY NEUROLOGIST: Dr. Frances Furbish  Virtual Visit via Video Note  I connected with [name] on 01/19/22 at  1:45 PM EST by a video enabled telemedicine application located remotely at [facility] and verified that I am speaking with the correct person using two identifiers who was located at their own home.   I discussed the limitations of evaluation and management by telemedicine and the availability of in person appointments. The patient expressed understanding and agreed to proceed.   PATIENT: [name] DOB: 11-08-1960  REASON FOR VISIT: follow up HISTORY FROM: patient  HISTORY OF PRESENT ILLNESS: Today 01/19/22:  [name] is a 61 year old female with a history of obstructive sleep apnea on CPAP.  She returns today for follow-up.  Her download is below.  She reports that the CPAP is working well for her.  Denies any new issues.  States that she cannot sleep without it.    01/13/21:[name] is a 61 year old female with a history of obstructive sleep apnea on CPAP.  She reports that the CPAP is working well for her.  She denies any new issues.  Her download is below.    HISTORY 12/04/19:   [name] is a 61 year old female with a history of obstructive sleep apnea on CPAP.  Her download indicates that she use her machine nightly for compliance of 100%.  She use her machine greater than 4 hours each night.  On average she uses her machine 10 hours and 1 minute.  Her residual AHI is 0.9 on 12 cm of water with EPR 3.  Leak in the 95th percentile is 14.2 L/min.  She reports that the CPAP is working well for her.  She is interested in losing weight however reports that she has a sedentary job and has not been watching her diet.  REVIEW OF SYSTEMS: Out of a complete 14 system review of symptoms, the patient complains only of the following symptoms, and all  other reviewed systems are negative.  ALLERGIES: Allergies  Allergen Reactions   Sulfonamide Derivatives Hives and Itching   Tikosyn [Dofetilide]     Prolonged qt    HOME MEDICATIONS: Outpatient Medications Prior to Visit  Medication Sig Dispense Refill   albuterol (PROVENTIL) (2.5 MG/3ML) 0.083% nebulizer solution Take 3 mLs (2.5 mg total) by nebulization every 6 (six) hours as needed for wheezing or shortness of breath. 75 mL 12   albuterol (VENTOLIN HFA) 108 (90 Base) MCG/ACT inhaler TAKE 2 PUFFS BY MOUTH EVERY 6 HOURS AS NEEDED FOR WHEEZE OR SHORTNESS OF BREATH 8.5 each 1   aspirin-acetaminophen-caffeine (EXCEDRIN MIGRAINE) 250-250-65 MG per tablet Take 1 tablet by mouth every 6 (six) hours as needed for headache.     BISOPROLOL FUMARATE PO Take 5 mg by mouth daily.     busPIRone (BUSPAR) 10 MG tablet TAKE 1/2 TO 1 TABLET 3 X /DAY FOR ANXIETY 270 tablet 3   diltiazem (CARDIZEM CD) 360 MG 24 hr capsule Take 1 capsule daily for BP & Heart Rhythm 90 capsule 3   diphenhydrAMINE HCl, Sleep, 50 MG CAPS Take 1 capsule by mouth daily.     DULoxetine (CYMBALTA) 60 MG capsule Take 1 capsule (60 mg total) by mouth daily. (Patient taking differently: Take 60 mg by mouth daily.  Takes 1/2 tablet prn) 90 capsule 3   Fluticasone-Umeclidin-Vilant (TRELEGY ELLIPTA) 100-62.5-25 MCG/ACT AEPB Inhale 100 mcg into the  lungs daily. 60 each 6   furosemide (LASIX) 40 MG tablet TAKE 1 TABLET 2 TO 3 X /DAY AS DIRECTED FOR BP & FLUID RETENTION / ANKLE SWELLING 270 tablet 3   gabapentin (NEURONTIN) 300 MG capsule TAKE 1 CAPSULE 3 X /DAY FOR CHRONIC BACK PAIN 270 capsule 1   ipratropium-albuterol (DUONEB) 0.5-2.5 (3) MG/3ML SOLN USE 1 NEBULE VIA NEBULIZATION EVERY 4 HOURS AS NEEDED (MAX 6 DOSES PER DAY) 540 mL 3   metolazone (ZAROXOLYN) 2.5 MG tablet TAKE 1 TABLET (2.5 MG TOTAL) BY MOUTH AS NEEDED. 90 tablet 1   potassium chloride (KLOR-CON) 10 MEQ tablet TAKE 2 TABLETS 2 X /DAY FOR POTASSIUM & TAKE ADDITIONAL 2  TABLETS WITH ADDITIONAL DOSE OF LASIX 360 tablet 3   rivaroxaban (XARELTO) 20 MG TABS tablet Take  1 tablet  Daily  to Prevent Blood Clots from Atrial Fibrillation  (Dx:  i48.91) 90 tablet 3   rosuvastatin (CRESTOR) 10 MG tablet Take 1 tablet (10 mg total) by mouth daily. 90 tablet 3   traZODone (DESYREL) 100 MG tablet Take  1 tablet  1 hour  before Bedtime  as needed for Sleep 90 tablet 3   No facility-administered medications prior to visit.    PAST MEDICAL HISTORY: Past Medical History:  Diagnosis Date   Anemia    Anxiety    Atrial flutter (HCC)    Borderline diabetes    CHF (congestive heart failure) (HCC)    Chronic diastolic heart failure (HCC)    COPD (chronic obstructive pulmonary disease) (HCC)    Depression    GERD (gastroesophageal reflux disease)    HTN (hypertension)    Hyperlipidemia    Obstructive sleep apnea    Persistent atrial fibrillation (HCC)    Myoview 4/09: No ischemia;  echo 4/09: EF 60-65%;   Flecainide, beta blocker, Coumadin;    Unable to take Tikosyn 2/2 prolonged QT   Pulmonary embolism (HCC)    Right lower lobe diagnosed by CT 6/12   Type II or unspecified type diabetes mellitus without mention of complication, not stated as uncontrolled     PAST SURGICAL HISTORY: Past Surgical History:  Procedure Laterality Date   basal skin can     CARDIOVERSION  01/19/2012   Procedure: CARDIOVERSION;  Surgeon: Dolores Patty, MD;  [facility] ENDOSCOPY;  Service: Cardiovascular;  Laterality: N/A;   CARDIOVERSION  01/21/2012   Procedure: CARDIOVERSION;  Surgeon: Luis Abed, MD;  Location: [facility] ENDOSCOPY;  Service: Cardiovascular;  Laterality: N/A;  Rm [id number]   TEE WITHOUT CARDIOVERSION  01/19/2012   Procedure: TRANSESOPHAGEAL ECHOCARDIOGRAM (TEE);  Surgeon: Dolores Patty, MD;  Location: [facility] ENDOSCOPY;  Service: Cardiovascular;  Laterality: N/A;   TUBAL LIGATION  1996    FAMILY HISTORY: Family History  Problem Relation Age of Onset   Colon cancer  Mother    Coronary artery disease Mother    Coronary artery disease Father    Asthma Father    Emphysema Father    Allergies Father    Heart attack Maternal Grandfather    Heart disease Maternal Grandmother    Depression Daughter     SOCIAL HISTORY: Social History   Socioeconomic History   Marital status: Legally Separated    Spouse name: n/a   Number of children: 4   Years of education: 12   Highest education level: Not on file  Occupational History   Occupation: clerical    Comment: Printmaker  Tobacco Use   Smoking status: Former  Packs/day: 2.00    Years: 35.00    Total pack years: 70.00    Types: Cigarettes    Quit date: 09/08/2009    Years since quitting: 12.3   Smokeless tobacco: Never  Vaping Use   Vaping Use: Never used  Substance and Sexual Activity   Alcohol use: No   Drug use: No   Sexual activity: Never    Birth control/protection: None  Other Topics Concern   Not on file  Social History Narrative   Married but currently separated with 4 children, 2 of whom still live with her.  Youngest son is a Holiday representative in [employer], to graduate in 07/2012.   Social Determinants of Health   Financial Resource Strain: Not on file  Food Insecurity: Not on file  Transportation Needs: Not on file  Physical Activity: Not on file  Stress: Not on file  Social Connections: Not on file  Intimate Partner Violence: Not on file      PHYSICAL EXAM Generalized: Well developed, in no acute distress   Neurological examination  Mentation: Alert oriented to time, place, history taking. Follows all commands speech and language fluent   DIAGNOSTIC DATA (LABS, IMAGING, TESTING) - I reviewed patient records, labs, notes, testing and imaging myself where available.  Lab Results  Component Value Date   WBC 8.1 11/27/2021   HGB 14.6 11/27/2021   HCT 43.7 11/27/2021   MCV 88.1 11/27/2021   PLT 224 11/27/2021      Component Value Date/Time   NA 143 11/27/2021 0000   K  3.1 (L) 11/27/2021 0000   CL 96 (L) 11/27/2021 0000   CO2 35 (H) 11/27/2021 0000   GLUCOSE 149 (H) 11/27/2021 0000   BUN 29 (H) 11/27/2021 0000   CREATININE 1.48 (H) 11/27/2021 0000   CALCIUM 9.6 11/27/2021 0000   PROT 7.2 11/27/2021 0000   ALBUMIN 4.1 09/21/2016 1728   AST 18 11/27/2021 0000   ALT 26 11/27/2021 0000   ALKPHOS 85 09/21/2016 1728   BILITOT 0.8 11/27/2021 0000   GFRNONAA 32 (L) 03/19/2020 0941   GFRAA 38 (L) 03/19/2020 0941   Lab Results  Component Value Date   CHOL 143 11/27/2021   HDL 43 (L) 11/27/2021   LDLCALC 75 11/27/2021   TRIG 158 (H) 11/27/2021   CHOLHDL 3.3 11/27/2021   Lab Results  Component Value Date   HGBA1C 7.2 (H) 11/27/2021   Lab Results  Component Value Date   VITAMINB12 594 11/30/2018   Lab Results  Component Value Date   TSH 5.07 (H) 11/27/2021      ASSESSMENT AND PLAN 61 y.o. year old female  has a past medical history of Anemia, Anxiety, Atrial flutter (HCC), Borderline diabetes, CHF (congestive heart failure) (HCC), Chronic diastolic heart failure (HCC), COPD (chronic obstructive pulmonary disease) (HCC), Depression, GERD (gastroesophageal reflux disease), HTN (hypertension), Hyperlipidemia, Obstructive sleep apnea, Persistent atrial fibrillation (HCC), Pulmonary embolism (HCC), and Type II or unspecified type diabetes mellitus without mention of complication, not stated as uncontrolled. here with:  OSA on CPAP  CPAP compliance excellent Residual AHI is good Encouraged patient to continue using CPAP nightly and > 4 hours each night F/U in 1 year or sooner if needed    Butch Penny, MSN, NP-C 01/19/2022, 1:43 PM [facility] [address] [place], [place] [address] [phone]

## 2022-01-19 ENCOUNTER — Telehealth (INDEPENDENT_AMBULATORY_CARE_PROVIDER_SITE_OTHER): Payer: Commercial Managed Care - HMO | Admitting: Adult Health

## 2022-01-19 DIAGNOSIS — G4733 Obstructive sleep apnea (adult) (pediatric): Secondary | ICD-10-CM | POA: Diagnosis not present

## 2022-01-31 ENCOUNTER — Other Ambulatory Visit: Payer: Self-pay | Admitting: Nurse Practitioner

## 2022-01-31 DIAGNOSIS — I159 Secondary hypertension, unspecified: Secondary | ICD-10-CM

## 2022-01-31 DIAGNOSIS — I5032 Chronic diastolic (congestive) heart failure: Secondary | ICD-10-CM

## 2022-01-31 DIAGNOSIS — E1169 Type 2 diabetes mellitus with other specified complication: Secondary | ICD-10-CM

## 2022-02-03 ENCOUNTER — Other Ambulatory Visit: Payer: Self-pay | Admitting: Nurse Practitioner

## 2022-02-03 DIAGNOSIS — F32A Depression, unspecified: Secondary | ICD-10-CM

## 2022-02-03 DIAGNOSIS — F331 Major depressive disorder, recurrent, moderate: Secondary | ICD-10-CM

## 2022-03-01 NOTE — Progress Notes (Signed)
Complete Physical  Assessment and Plan: [name] was seen today for annual exam.  Diagnoses and all orders for this visit:  Encounter for general adult medical examination with abnormal findings Due Annually  Persistent atrial fibrillation ([place]  Continue medications, rate controlled, on NOAC, follow up cardio- has not been in over a year so encouraged to schedule -     EKG 12-Lead She has not been taking Xarelto due to cost, has been using low dose aspirin  History of pulmonary embolus (PE)  Has not been taking Xarelto as she can no longer afford the medication, has been using low dose ASA  Chronic obstructive pulmonary disease, unspecified COPD type ([place]) Continue inhalers- has not been using Trelegy   Atrial flutter, unspecified type ([place]) Continue to follow with cardiology, has not been in over a year- encouraged to schedule and given Xarelto savings card  OSA and COPD overlap syndrome ([place]) Stay on CPAP, weight loss advised  Type 2 diabetes mellitus with stage 3b chronic kidney disease, without long-term current use of insulin ([place]) Discussed general issues about diabetes pathophysiology and management., Educational material distributed., Suggested low cholesterol diet., Encouraged aerobic exercise., Discussed foot care., Reminded to get yearly retinal exam. -     COMPLETE METABOLIC PANEL WITH GFR -     Hemoglobin A1c -     Urinalysis, Routine w reflex culture -     Microalbumin / creatinine urine ratio  Hyperlipidemia associated with type 2 diabetes mellitus (HCC) check lipids decrease fatty foods increase activity.  -     COMPLETE METABOLIC PANEL WITH GFR - AAA -     Lipid panel -     rosuvastatin (CRESTOR) 10 MG tablet; Take 1 tablet (10 mg total) by mouth daily.  CHF (congestive heart failure), NYHA class III, chronic, diastolic (HCC) Follow up with cardio has not been for over a year so encouraged to schedule, monitor weight daily, weight loss advised Continue to  use metolazone as needed- usually only once a week -EKG - AAA   Secondary hypertension -     CBC with Differential/Platelet -     Urinalysis, Routine w reflex microscopic -     Microalbumin / creatinine urine ratio - cardiology following, continue medications, DASH diet, exercise and monitor at home. Call if greater than 130/80. Has not been to cardiology for over a year, encouraged to schedule   Morbid obesity ([place]) - follow up 3 months for progress monitoring; she will work on increasing fruit/veggies intake - increase veggies, try to limit snacking - long discussion about weight loss, diet, and exercise - will try to improve bowel habits, then try ozempic, poor candidate for phentermine due to heart, metformin due to renal functions - given information about emotional eating  Gastroesophageal reflux disease, unspecified whether esophagitis present Continue PPI/H2 blocker, diet discussed -     Magnesium  Other specified hypothyroidism -check TSH level, continue medications the same, reminded to take on an empty stomach 30-65mns before food.  -     TSH  Depression, major, recurrent, moderate (HCC) -     venlafaxine XR (EFFEXOR XR) 75 MG 24 hr capsule; Take 1 capsule (75 mg total) by mouth daily. -     traZODone (DESYREL) 100 MG tablet; Take  1 tablet  1 hour  before Bedtime  as needed for Sleep Change to new medication and avoid stopping medication , stress management techniques discussed, increase water, good sleep hygiene discussed, increase exercise, and increase veggies.   Vitamin  D deficiency -     VITAMIN D 25 Hydroxy (Vit-D Deficiency, Fractures) Currently not taking supplementation  Medication management Magnesium   Chronic bilateral low back pain without sciatica Continue Gabapentin for pain, controls well -     gabapentin (NEURONTIN) 300 MG capsule; Take 1 capsule 3x/day for Chronic Back Pain   Family history of colon cancer - OVERDUE, has constipation as  well, get on miralax and needs colonoscopy - She discussed with Dr. Collene Mares, concern due to cardiac history and reports was recommended weight loss prior to colonoscopy. WEIGHT LOSS HIGH PRIORITY THIS YEAR Will order Cologuard as did not lose weight for colonoscopy        Discussed med's effects and SE's. Screening labs and tests as requested with regular follow-up as recommended. Over 40 minutes of exam, counseling, chart review, and complex, high level critical decision making was performed this visit.  Future Appointments  Date Time Provider [place]  01/25/2023  1:45 PM Ward Givens, NP [facility] None  03/03/2023  9:00 AM Alycia Rossetti, NP [facility] None    HPI  62 y.o. female  presents for a complete physical and follow up for has Depression, major, recurrent, moderate (HCC); OSA and COPD overlap syndrome ([place]); G E R D; CHF (congestive heart failure), NYHA class III, chronic, diastolic ([place]); History of pulmonary embolus (PE); Atrial flutter (HCC); COPD (chronic obstructive pulmonary disease) ([place]); Atrial fibrillation ([place]); Hypothyroid; Hyperlipidemia associated with type 2 diabetes mellitus ([place]); HTN (hypertension); Type 2 diabetes with kidney complications ([place]); Anxiety; Vitamin D deficiency; Morbid obesity ([place]); Noncompliance; CKD stage G3b/A1, GFR 30-44 and albumin creatinine ratio <30 mg/g ([place]); Pulmonary hypertension due to COPD ([place]); Obstructive sleep apnea treated with continuous positive airway pressure (CPAP); Constipation; and Benign hypertensive heart and kidney disease with diastolic CHF, NYHA class 3 and CKD stage 3 ([place]) on their problem list.  Youngest son moved out in 03/2020, joined the [employer] in 09/2020 and went to [place].  He came to visit for 10 days and is leaving for 4 years to [place].  2 Kids don't talk to her.  Work stress, working from home.  Crying in the office today over son leaving to [place]. More difficulty concentrating. She has been  having crying episodes off and on in the past 6 months.  Celexa and Buspar are not controlling depression. Denies suicidal and homicidal ideation.   She doesn't drive, family drives as needed. Separated from husband since 2009.  Most stress is from family issues, son has joined the [employer] She has hx of recurrent depression, on cymbalta 60 mg, trazodone 50 mg, BUspar Also taking gabapentin at night which helps but ran out several weeks ago. Symptoms are not currently controlled- has delayed speech, stuttering when getting overwhelmed.   She takes naproxen PRN rarely for aches and pains.   BMI is Body mass index is 51.4 kg/m., she is working on diet and exercise. She reports just started working with dietician from work, doing more yogurt/fruit, eggs. Doesn't track weight.   Wt Readings from Last 3 Encounters:  03/02/22 250 lb 3.2 oz (113.5 kg)  11/27/21 252 lb (114.3 kg)  09/03/21 258 lb (117 kg)   She has a history of afib/flutter and CHF, she is on  cardizem and bisoprolol she is on xarelto, spriolactone 1/2 at night, following with Dr. Haroldine Laws.  Has not been taking Xarelto due to cost- given savings card She is on lasix 2 a day and zaroxyln 1 x a week  and 6-8 potassium a day.   She also has PHTN due to COPD and OSA, she is on CPAP, follows with neurology, endorses 100% compliance. Has follow up tomorrow with Dr. Brett Fairy.   She is on trellegy ellipta for COPD, nebs PRN and reports doing well. Has been having trouble affording the Trelegy, will give savings card  Her blood pressure has been controlled at home, today their BP is BP: 124/62 BP Readings from Last 3 Encounters:  03/02/22 124/62  11/27/21 132/60  09/03/21 108/69     She does not workout. She denies chest pain, shortness of breath, dizziness.  She is not on cholesterol medication, tried Rosuvastatin not taking now and can not remember if she any reactions to the medication. Her cholesterol is not at goal. The  cholesterol last visit was:   Lab Results  Component Value Date   CHOL 143 11/27/2021   HDL 43 (L) 11/27/2021   LDLCALC 75 11/27/2021   TRIG 158 (H) 11/27/2021   CHOLHDL 3.3 11/27/2021   She has been working on diet and exercise for lifestyle managed T2 diabetes that has fortunately remained <7%, she is on bASA, she is on ACE/ARB and denies paresthesia of the feet, polydipsia, polyuria and visual disturbances. She has not been checking her blood sugarsLast A1C in the office was:  Lab Results  Component Value Date   HGBA1C 7.2 (H) 11/27/2021    She is trying to drink more water. She has CKD IIIb r/t htn and T2DM monitored here Lab Results  Component Value Date   EGFR 40 (L) 11/27/2021    She is on thyroid medication. Her medication was not changed last visit.   Lab Results  Component Value Date   TSH 5.07 (H) 11/27/2021   Patient is on Vitamin D supplement and now calcium pills, states she has less back pain.   Lab Results  Component Value Date   VD25OH 43 01/15/2021        Current Medications:  Current Outpatient Medications on File Prior to Visit  Medication Sig Dispense Refill   albuterol (VENTOLIN HFA) 108 (90 Base) MCG/ACT inhaler TAKE 2 PUFFS BY MOUTH EVERY 6 HOURS AS NEEDED FOR WHEEZE OR SHORTNESS OF BREATH 8.5 each 1   Ascorbic Acid (VITAMIN C PO) Take by mouth. PRN     aspirin EC (SB LOW DOSE ASA EC) 81 MG tablet Take 81 mg by mouth daily. Swallow whole.     aspirin-acetaminophen-caffeine (EXCEDRIN MIGRAINE) 250-250-65 MG per tablet Take 1 tablet by mouth every 6 (six) hours as needed for headache.     bisoprolol (ZEBETA) 5 MG tablet TAKE 1 TABLET DAILY FOR BP (DX: I.10) 90 tablet 3   busPIRone (BUSPAR) 10 MG tablet TAKE 1/2 TO 1 TABLET 3 X /DAY FOR ANXIETY 270 tablet 3   Calcium Citrate-Vitamin D (CALCIUM + D PO) Take by mouth.     Cyanocobalamin (B-12 PO) Take by mouth.     diltiazem (CARDIZEM CD) 360 MG 24 hr capsule Take 1 capsule daily for BP & Heart Rhythm 90  capsule 3   diphenhydrAMINE HCl, Sleep, 50 MG CAPS Take 1 capsule by mouth daily.     furosemide (LASIX) 40 MG tablet TAKE 1 TABLET 2 TO 3 X /DAY AS DIRECTED FOR BP & FLUID RETENTION / ANKLE SWELLING 270 tablet 3   gabapentin (NEURONTIN) 300 MG capsule TAKE 1 CAPSULE 3 X /DAY FOR CHRONIC BACK PAIN 270 capsule 1   metolazone (ZAROXOLYN) 2.5 MG tablet TAKE  1 TABLET (2.5 MG TOTAL) BY MOUTH AS NEEDED. 90 tablet 1   potassium chloride (KLOR-CON) 10 MEQ tablet TAKE 2 TABLETS 2 X /DAY FOR POTASSIUM & TAKE ADDITIONAL 2 TABLETS WITH ADDITIONAL DOSE OF LASIX 360 tablet 3   rosuvastatin (CRESTOR) 10 MG tablet TAKE 1 TABLET BY MOUTH EVERY DAY 90 tablet 3   traZODone (DESYREL) 100 MG tablet TAKE 1 TABLET 1 HOUR BEFORE BEDTIME AS NEEDED FOR SLEEP 90 tablet 3   albuterol (PROVENTIL) (2.5 MG/3ML) 0.083% nebulizer solution Take 3 mLs (2.5 mg total) by nebulization every 6 (six) hours as needed for wheezing or shortness of breath. 75 mL 12   BISOPROLOL FUMARATE PO Take 5 mg by mouth daily. (Patient not taking: Reported on 03/02/2022)     DULoxetine (CYMBALTA) 60 MG capsule Take 1 capsule (60 mg total) by mouth daily. (Patient taking differently: Take 60 mg by mouth daily.  Takes 1/2 tablet prn) 90 capsule 3   Fluticasone-Umeclidin-Vilant (TRELEGY ELLIPTA) 100-62.5-25 MCG/ACT AEPB Inhale 100 mcg into the lungs daily. (Patient not taking: Reported on 03/02/2022) 60 each 6   ipratropium-albuterol (DUONEB) 0.5-2.5 (3) MG/3ML SOLN USE 1 NEBULE VIA NEBULIZATION EVERY 4 HOURS AS NEEDED (MAX 6 DOSES PER DAY) 540 mL 3   rivaroxaban (XARELTO) 20 MG TABS tablet Take  1 tablet  Daily  to Prevent Blood Clots from Atrial Fibrillation  (Dx:  i48.91) (Patient not taking: Reported on 03/02/2022) 90 tablet 3   No current facility-administered medications on file prior to visit.   Allergies:  Allergies  Allergen Reactions   Sulfonamide Derivatives Hives and Itching   Tikosyn [Dofetilide]     Prolonged qt   Medical History:  She  has Depression, major, recurrent, moderate (HCC); OSA and COPD overlap syndrome ([place]); G E R D; CHF (congestive heart failure), NYHA class III, chronic, diastolic ([place]); History of pulmonary embolus (PE); Atrial flutter (HCC); COPD (chronic obstructive pulmonary disease) ([place]); Atrial fibrillation ([place]); Hypothyroid; Hyperlipidemia associated with type 2 diabetes mellitus ([place]); HTN (hypertension); Type 2 diabetes with kidney complications ([place]); Anxiety; Vitamin D deficiency; Morbid obesity ([place]); Noncompliance; CKD stage G3b/A1, GFR 30-44 and albumin creatinine ratio <30 mg/g ([place]); Pulmonary hypertension due to COPD ([place]); Obstructive sleep apnea treated with continuous positive airway pressure (CPAP); Constipation; and Benign hypertensive heart and kidney disease with diastolic CHF, NYHA class 3 and CKD stage 3 ([place]) on their problem list.   Health Maintenance:   Immunization History  Administered Date(s) Administered   Influenza Inj Mdck Quad With Preservative 11/30/2018, 12/03/2019   Influenza,inj,Quad PF,6+ Mos 11/27/2021   PFIZER(Purple Top)SARS-COV-2 Vaccination 12/02/2019, 12/25/2019   PNEUMOCOCCAL CONJUGATE-20 01/15/2021   Td 02/08/2001   Tdap 11/07/2017    Tetanus: 2019 Pneumovax: 01/15/21 Prevnar 13:  Flu vaccine:11/2020 at kidney doctor Shingrix: will check with insurance  Covid 19: 1/2, 2021, pfizer  Pap: 2010 -has been declining "I'm embarrassed about my weight." after long discussion will refer to GYN currently refuses MGM: 2006 OVERDUE -poor follow through -has not had  DEXA: age 65  Colonoscopy: 2003 colon cancer in her mom late 20's passed age 89 - states can not do the colonoscopy due to health, Dr. Collene Mares discussed with Dr. Missy Sabins, medium, advised work on weight loss first  Cologuard ordered today  Last eye: several years, encouraged overdue diabetic eye, she declines referral for this today- strongly encouraged appointment Last dental:   Patient Care  Team: Unk Pinto, MD as PCP - General (Internal Medicine) Stanford Breed, Denice Bors, MD as Consulting Physician (  Cardiology) Bensimhon, Shaune Pascal, MD as Consulting Physician (Cardiology)  Surgical History:  She has a past surgical history that includes Tubal ligation (1996); TEE without cardioversion (01/19/2012); Cardioversion (01/19/2012); Cardioversion (01/21/2012); and basal skin can. Family History:  Herfamily history includes Allergies in her father; Asthma in her father; Colon cancer in her mother; Coronary artery disease in her father and mother; Depression in her daughter; Emphysema in her father; Heart attack in her maternal grandfather; Heart disease in her maternal grandmother. Social History:  She reports that she quit smoking about 12 years ago. Her smoking use included cigarettes. She has a 70.00 pack-year smoking history. She has never used smokeless tobacco. She reports that she does not drink alcohol and does not use drugs.  Review of Systems: Review of Systems  Constitutional: Negative.  Negative for malaise/fatigue and weight loss.  HENT: Negative.  Negative for hearing loss and tinnitus.   Eyes: Negative.  Negative for blurred vision and double vision.  Respiratory:  Positive for shortness of breath (on exertion). Negative for cough, sputum production and wheezing.   Cardiovascular: Negative.  Negative for chest pain, palpitations, orthopnea, claudication, leg swelling and PND.  Gastrointestinal: Negative.  Negative for abdominal pain, blood in stool, constipation, diarrhea, heartburn, melena, nausea and vomiting.  Genitourinary: Negative.  Negative for dysuria.  Musculoskeletal:  Positive for back pain and joint pain (intermittent hips, back). Negative for falls and myalgias.  Skin: Negative.  Negative for rash.  Neurological:  Negative for dizziness, tingling, sensory change, weakness and headaches.  Endo/Heme/Allergies:  Negative for polydipsia. Does not bruise/bleed  easily.  Psychiatric/Behavioral:  Positive for depression. Negative for memory loss, substance abuse and suicidal ideas. The patient is not nervous/anxious and does not have insomnia.   All other systems reviewed and are negative.   Physical Exam: Estimated body mass index is 51.4 kg/m as calculated from the following:   Height as of this encounter: 4' 10.5" (1.486 m).   Weight as of this encounter: 250 lb 3.2 oz (113.5 kg).  BP 124/62   Pulse 79   Temp 98.1 F (36.7 C)   Ht 4' 10.5" (1.486 m)   Wt 250 lb 3.2 oz (113.5 kg)   SpO2 96%   BMI 51.40 kg/m  General Appearance: Well dressed, morbidly obese, in no apparent distress. Eyes: PERRLA, EOMs, conjunctiva no swelling or erythema Sinuses: No Frontal/maxillary tenderness ENT/Mouth: Ext aud canals clear, TMs without erythema, bulging. No erythema, swelling, or exudate on post pharynx.Tonsils not swollen or erythematous. Hearing normal. Crowded mouth Neck: Supple, thyroid normal.  Respiratory: Decreased breath sounds bilateral, with wheezing and decreased breath sounds bilateral lower lobes Cardio: Irreg, irreg,  minimal edema Abdomen: Soft, + BS, Morbidly obese,  Non tender, no guarding, rebound, no palpable hernias, masses. Lymphatics: Non tender without lymphadenopathy.  Musculoskeletal: Body habitus limits exam, symmetrical ROM, 5/5 strength, antalgic gait with cane Skin: warm, dry intact, without notable rash, lesions, ecchymosis. Bilateral feet thickened long toenail no ulcers. Has very thick callouses bil, multiple seborrheic keratoses on torso Neuro: Cranial nerves intact. No cerebellar symptoms. Bil feet monofilament intact 10/10 but diminished throughout  Psych: Awake and oriented X 3,  Intermittently tearful/depressed affect, Insight and Judgment appropriate.  Breasts: defer to GYN, referral placed GU: body habitus limits - refer to GYN  EKG: A. Fib, no ST changes AAA: < 3 cm  Jarrius Huaracha E  9:33 AM [facility]

## 2022-03-02 ENCOUNTER — Encounter: Payer: Self-pay | Admitting: Nurse Practitioner

## 2022-03-02 ENCOUNTER — Other Ambulatory Visit: Payer: Self-pay

## 2022-03-02 ENCOUNTER — Ambulatory Visit (INDEPENDENT_AMBULATORY_CARE_PROVIDER_SITE_OTHER): Payer: Commercial Managed Care - HMO | Admitting: Nurse Practitioner

## 2022-03-02 VITALS — BP 124/62 | HR 79 | Temp 98.1°F | Ht 58.5 in | Wt 250.2 lb

## 2022-03-02 DIAGNOSIS — I4892 Unspecified atrial flutter: Secondary | ICD-10-CM

## 2022-03-02 DIAGNOSIS — Z800 Family history of malignant neoplasm of digestive organs: Secondary | ICD-10-CM

## 2022-03-02 DIAGNOSIS — E1122 Type 2 diabetes mellitus with diabetic chronic kidney disease: Secondary | ICD-10-CM

## 2022-03-02 DIAGNOSIS — Z0001 Encounter for general adult medical examination with abnormal findings: Secondary | ICD-10-CM

## 2022-03-02 DIAGNOSIS — M545 Low back pain, unspecified: Secondary | ICD-10-CM

## 2022-03-02 DIAGNOSIS — I10 Essential (primary) hypertension: Secondary | ICD-10-CM

## 2022-03-02 DIAGNOSIS — Z131 Encounter for screening for diabetes mellitus: Secondary | ICD-10-CM

## 2022-03-02 DIAGNOSIS — Z79899 Other long term (current) drug therapy: Secondary | ICD-10-CM | POA: Diagnosis not present

## 2022-03-02 DIAGNOSIS — Z136 Encounter for screening for cardiovascular disorders: Secondary | ICD-10-CM

## 2022-03-02 DIAGNOSIS — I2723 Pulmonary hypertension due to lung diseases and hypoxia: Secondary | ICD-10-CM

## 2022-03-02 DIAGNOSIS — I700 Atherosclerosis of aorta: Secondary | ICD-10-CM

## 2022-03-02 DIAGNOSIS — Z13220 Encounter for screening for lipoid disorders: Secondary | ICD-10-CM

## 2022-03-02 DIAGNOSIS — J449 Chronic obstructive pulmonary disease, unspecified: Secondary | ICD-10-CM

## 2022-03-02 DIAGNOSIS — E1169 Type 2 diabetes mellitus with other specified complication: Secondary | ICD-10-CM

## 2022-03-02 DIAGNOSIS — Z1389 Encounter for screening for other disorder: Secondary | ICD-10-CM | POA: Diagnosis not present

## 2022-03-02 DIAGNOSIS — E559 Vitamin D deficiency, unspecified: Secondary | ICD-10-CM | POA: Diagnosis not present

## 2022-03-02 DIAGNOSIS — Z1211 Encounter for screening for malignant neoplasm of colon: Secondary | ICD-10-CM

## 2022-03-02 DIAGNOSIS — F331 Major depressive disorder, recurrent, moderate: Secondary | ICD-10-CM

## 2022-03-02 DIAGNOSIS — I4819 Other persistent atrial fibrillation: Secondary | ICD-10-CM

## 2022-03-02 DIAGNOSIS — Z0000 Encounter for general adult medical examination without abnormal findings: Secondary | ICD-10-CM | POA: Diagnosis not present

## 2022-03-02 DIAGNOSIS — N1832 Chronic kidney disease, stage 3b: Secondary | ICD-10-CM

## 2022-03-02 DIAGNOSIS — G4733 Obstructive sleep apnea (adult) (pediatric): Secondary | ICD-10-CM

## 2022-03-02 DIAGNOSIS — K219 Gastro-esophageal reflux disease without esophagitis: Secondary | ICD-10-CM

## 2022-03-02 DIAGNOSIS — Z86711 Personal history of pulmonary embolism: Secondary | ICD-10-CM

## 2022-03-02 DIAGNOSIS — I5032 Chronic diastolic (congestive) heart failure: Secondary | ICD-10-CM

## 2022-03-02 DIAGNOSIS — E039 Hypothyroidism, unspecified: Secondary | ICD-10-CM

## 2022-03-02 MED ORDER — TRELEGY ELLIPTA 100-62.5-25 MCG/ACT IN AEPB: 100.0000 ug | INHALATION_SPRAY | Freq: Every day | RESPIRATORY_TRACT | 6 refills | Status: DC

## 2022-03-02 MED ORDER — DULOXETINE HCL 60 MG PO CPEP: 60.0000 mg | ORAL_CAPSULE | Freq: Every day | ORAL | 3 refills | Status: DC

## 2022-03-02 NOTE — Patient Instructions (Signed)
Schedule appointment with cardiology- you are overdue Cologuard kit is coming to your house    [place]   Know what a healthy weight is for you (roughly BMI <25) and aim to maintain this   Aim for 7+ servings of fruits and vegetables daily   70-80+ fluid ounces of water or unsweet tea for healthy kidneys   Limit to max 1 drink of alcohol per day; avoid smoking/tobacco   Limit animal fats in diet for cholesterol and heart health - choose grass fed whenever available   Avoid highly processed foods, and foods high in saturated/trans fats   Aim for low stress - take time to unwind and care for your mental health   Aim for 150 min of moderate intensity exercise weekly for heart health, and weights twice weekly for bone health   Aim for 7-9 hours of sleep daily

## 2022-03-04 LAB — URINALYSIS W MICROSCOPIC + REFLEX CULTURE
Bilirubin Urine: NEGATIVE
Glucose, UA: NEGATIVE
Hgb urine dipstick: NEGATIVE
Nitrites, Initial: NEGATIVE
Specific Gravity, Urine: 1.019 (ref 1.001–1.035)
pH: 6 (ref 5.0–8.0)

## 2022-03-04 LAB — CBC WITH DIFFERENTIAL/PLATELET
Absolute Monocytes: 387 cells/uL (ref 200–950)
Basophils Absolute: 43 cells/uL (ref 0–200)
Basophils Relative: 0.5 %
Eosinophils Absolute: 60 cells/uL (ref 15–500)
Eosinophils Relative: 0.7 %
HCT: 39.6 % (ref 35.0–45.0)
Hemoglobin: 13.2 g/dL (ref 11.7–15.5)
Lymphs Abs: 1746 cells/uL (ref 850–3900)
MCH: 29.3 pg (ref 27.0–33.0)
MCHC: 33.3 g/dL (ref 32.0–36.0)
MCV: 87.8 fL (ref 80.0–100.0)
MPV: 12.4 fL (ref 7.5–12.5)
Monocytes Relative: 4.5 %
Neutro Abs: 6364 cells/uL (ref 1500–7800)
Neutrophils Relative %: 74 %
Platelets: 219 10*3/uL (ref 140–400)
RBC: 4.51 10*6/uL (ref 3.80–5.10)
RDW: 14 % (ref 11.0–15.0)
Total Lymphocyte: 20.3 %
WBC: 8.6 10*3/uL (ref 3.8–10.8)

## 2022-03-04 LAB — COMPLETE METABOLIC PANEL WITH GFR
AG Ratio: 1.9 (calc) (ref 1.0–2.5)
ALT: 26 U/L (ref 6–29)
AST: 18 U/L (ref 10–35)
Albumin: 4.3 g/dL (ref 3.6–5.1)
Alkaline phosphatase (APISO): 89 U/L (ref 37–153)
BUN/Creatinine Ratio: 11 (calc) (ref 6–22)
BUN: 17 mg/dL (ref 7–25)
CO2: 28 mmol/L (ref 20–32)
Calcium: 8.3 mg/dL — ABNORMAL LOW (ref 8.6–10.4)
Chloride: 101 mmol/L (ref 98–110)
Creat: 1.57 mg/dL — ABNORMAL HIGH (ref 0.50–1.05)
Globulin: 2.3 g/dL (calc) (ref 1.9–3.7)
Glucose, Bld: 161 mg/dL — ABNORMAL HIGH (ref 65–99)
Potassium: 4.3 mmol/L (ref 3.5–5.3)
Sodium: 139 mmol/L (ref 135–146)
Total Bilirubin: 0.5 mg/dL (ref 0.2–1.2)
Total Protein: 6.6 g/dL (ref 6.1–8.1)
eGFR: 37 mL/min/{1.73_m2} — ABNORMAL LOW (ref >=60)

## 2022-03-04 LAB — VITAMIN D 25 HYDROXY (VIT D DEFICIENCY, FRACTURES): Vit D, 25-Hydroxy: 47 ng/mL (ref 30–100)

## 2022-03-04 LAB — LIPID PANEL
Cholesterol: 127 mg/dL (ref <200)
HDL: 42 mg/dL — ABNORMAL LOW (ref >=50)
LDL Cholesterol (Calc): 59 mg/dL (calc)
Non-HDL Cholesterol (Calc): 85 mg/dL (calc) (ref <130)
Total CHOL/HDL Ratio: 3 (calc) (ref <5.0)
Triglycerides: 180 mg/dL — ABNORMAL HIGH (ref <150)

## 2022-03-04 LAB — URINE CULTURE
MICRO NUMBER:: 14465912
SPECIMEN QUALITY:: ADEQUATE

## 2022-03-04 LAB — HEMOGLOBIN A1C
Hgb A1c MFr Bld: 7.1 % of total Hgb — ABNORMAL HIGH (ref <5.7)
Mean Plasma Glucose: 157 mg/dL
eAG (mmol/L): 8.7 mmol/L

## 2022-03-04 LAB — CULTURE INDICATED

## 2022-03-04 LAB — MICROALBUMIN / CREATININE URINE RATIO
Creatinine, Urine: 203 mg/dL (ref 20–275)
Microalb Creat Ratio: 28 mcg/mg creat (ref <30)
Microalb, Ur: 5.7 mg/dL

## 2022-03-04 LAB — TSH: TSH: 4.23 mIU/L (ref 0.40–4.50)

## 2022-03-04 LAB — MAGNESIUM: Magnesium: 2.1 mg/dL (ref 1.5–2.5)

## 2022-03-29 ENCOUNTER — Other Ambulatory Visit: Payer: Self-pay | Admitting: Nurse Practitioner

## 2022-03-29 DIAGNOSIS — F331 Major depressive disorder, recurrent, moderate: Secondary | ICD-10-CM

## 2022-06-03 ENCOUNTER — Other Ambulatory Visit: Payer: Self-pay | Admitting: Nurse Practitioner

## 2022-06-03 DIAGNOSIS — I159 Secondary hypertension, unspecified: Secondary | ICD-10-CM

## 2022-06-09 ENCOUNTER — Other Ambulatory Visit: Payer: Self-pay | Admitting: Nurse Practitioner

## 2022-06-09 DIAGNOSIS — I5032 Chronic diastolic (congestive) heart failure: Secondary | ICD-10-CM

## 2022-06-17 NOTE — Progress Notes (Signed)
3 month follow up  Assessment and Plan:   Chronic atrial fibrillation (HCC) Resent CCB, rate controlled, on NOAC, has follow up cardio upcoming  Secondary hypertension - cardiology following, continue medications, DASH diet, exercise and monitor at home. Call if greater than 130/80.   Other pulmonary embolism without acute cor pulmonale, unspecified chronicity (HCC) Stay on CPAP, continue xarelto  Atrial flutter, unspecified type (HCC) Continue medications, rate controlled, on NOAC, follow up cardio Not taking Xarelto, taking ASA 81 mg- cannot afford Xarelto  Pulmonary hypertension due to COPD (HCC) Stay on CPAP, weight loss advised  CHF NYHA class III (symptoms with mildly strenuous activities), chronic, diastolic (HCC) Follow up with cardio, monitor weight daily, weight loss advised  OSA and COPD overlap syndrome (HCC) Stay on CPAP, weight loss advised  Chronic obstructive pulmonary disease, unspecified COPD type (HCC) Continue inhalers  Type 2 diabetes mellitus with CKD (HCC) Discussed general issues about diabetes pathophysiology and management., Educational material distributed., Suggested low cholesterol diet., Encouraged aerobic exercise., Discussed foot care., Reminded to get yearly retinal exam. CKD 3b/4 - will not intiate metformin She tried [place] and could not tolerate due to vaginal itching - CBC -     CMP -     Hemoglobin A1c  Hyperlipidemia assocaited with T2DM (HCC) Encourage daily use of Rosuvastatin decrease fatty foods increase activity.  -     Lipid panel  CKD (chronic kidney disease) stage 3b, GFR 44-30 ml/min due to diabetes mellitus(HCC) avoid NSAIDS, control glucose/BP, will monitor. Push fluids throughout the day, increase water intake -     COMPLETE METABOLIC PANEL WITH GFR - CBC  Hypothyroidism -check TSH level, continue medications the same, reminded to take on an empty stomach 30-27mins before food.  -     TSH  Depression, major,  recurrent, moderate (HCC)/anxiety  - continue medications, stress management techniques discussed, increase water, good sleep hygiene discussed, increase exercise, and increase veggies.  - Will continue Duloxetine 60 mg daily - Insomnia is controlled with Trazodone   Morbid obesity (HCC) - follow up 3 months for progress monitoring; she will work on increasing fruit/veggies intake - increase veggies, try to limit snacking - long discussion about weight loss, diet, and exercise - 6 pound weight loss in past 3 months - unable to give herself shots  Vitamin D deficiency Continue Vit D supplementation to maintain value in therapeutic level of 60-100   Gastroesophageal reflux disease, esophagitis presence not specified Continue PPI/H2 blocker, diet discussed -magnesium  Noncompliance Hx of; improved; will discuss barriers regularly and individualize plan to assist with compliance  Medication management -     Magnesium        Discussed med's effects and SE's. Screening labs and tests as requested with regular follow-up as recommended. Over 30 minutes of exam, counseling, chart review, and complex decision making was performed this visit.  Future Appointments  Date Time Provider Department Center  06/21/2022  9:30 AM Raynelle Dick, NP [facility] None  01/25/2023  1:45 PM Butch Penny, NP [facility] None  03/03/2023  9:00 AM Raynelle Dick, NP [facility] None    HPI  62 y.o. female  presents for 3 month follow up for T2DM with CKD, hyperlipidemia, htn, vit D def, depression, morbid obesity, hypothyroid.   She may sign up early for social security. She doesn't drive, family drives as needed.  Separated from husband since 2009 Mood is much better.   Has been taking Duloxetine 60 mg  and Buspar. Mood seems  to be controlled  BMI is Body mass index is 49.27 kg/m., she has not been  working on diet and exercise. [place] went to [place] and [place] some [place] and has been trying to get  more exercise  Wt Readings from Last 3 Encounters:  06/21/22 239 lb 12.8 oz (108.8 kg)  03/02/22 250 lb 3.2 oz (113.5 kg)  11/27/21 252 lb (114.3 kg)   She has a history of afib/flutter and CHF she is on xarelto,diltiazem 360 mg QD, and Bisoprolol 5 mg QD. She is on lasix 40 mg 2 a day and zaroxyln 2.5 mg  1 x a week and 4-6 potassium a day.   She also has PHTN due to COPD and OSA, she is on CPAP, follows with neurology, endorses 100% compliance. She follows with Dr. Vickey Huger.   She is on trellegy ellipta for COPD, nebs PRN and reports doing well.   Her blood pressure has been controlled at home, today their BP is BP: 110/76  BP Readings from Last 3 Encounters:  06/21/22 110/76  03/02/22 124/62  11/27/21 132/60  She does not workout. She denies chest pain, shortness of breath, dizziness.   She is on cholesterol medication ,prescribed Rosuvastatin 10 mg . Her cholesterol is not at goal. The cholesterol last visit was:   Lab Results  Component Value Date   CHOL 127 03/02/2022   HDL 42 (L) 03/02/2022   LDLCALC 59 03/02/2022   TRIG 180 (H) 03/02/2022   CHOLHDL 3.0 03/02/2022   She has been working on diet and exercise for lifestyle managed T2 diabetes that has fortunately remained <7%, she is on bASA, she is on ACE/ARB and denies paresthesia of the feet, polydipsia, polyuria and visual disturbances.  She does not check her blood sugar-has been eating a little more candy Last A1C in the office was:  Lab Results  Component Value Date   HGBA1C 7.1 (H) 03/02/2022    She has CKD IIIb r/t htn and T2DM monitored here, she ids trying to drink more water-  Lab Results  Component Value Date   EGFR 37 (L) 03/02/2022    She is on thyroid medication. Her medication was not changed last visit.   Lab Results  Component Value Date   TSH 4.23 03/02/2022   Patient is on Vitamin D supplement and now calcium pills, states she has less back pain. Taking 5000 IU.   Lab Results  Component Value  Date   VD25OH 47 03/02/2022        Current Medications:  Current Outpatient Medications on File Prior to Visit  Medication Sig Dispense Refill   albuterol (VENTOLIN HFA) 108 (90 Base) MCG/ACT inhaler TAKE 2 PUFFS BY MOUTH EVERY 6 HOURS AS NEEDED FOR WHEEZE OR SHORTNESS OF BREATH 8.5 each 1   Ascorbic Acid (VITAMIN C PO) Take by mouth. PRN     aspirin EC (SB LOW DOSE ASA EC) 81 MG tablet Take 81 mg by mouth daily. Swallow whole.     aspirin-acetaminophen-caffeine (EXCEDRIN MIGRAINE) 250-250-65 MG per tablet Take 1 tablet by mouth every 6 (six) hours as needed for headache.     bisoprolol (ZEBETA) 5 MG tablet TAKE 1 TABLET DAILY FOR BP (DX: I.10) 90 tablet 3   Calcium Citrate-Vitamin D (CALCIUM + D PO) Take by mouth.     Cyanocobalamin (B-12 PO) Take by mouth.     diltiazem (CARDIZEM CD) 360 MG 24 hr capsule TAKE 1 CAPSULE DAILY FOR BP & HEART RHYTHM 90 capsule 3  diphenhydrAMINE HCl, Sleep, 50 MG CAPS Take 1 capsule by mouth daily.     DULoxetine (CYMBALTA) 60 MG capsule Take 1 capsule (60 mg total) by mouth daily. 90 capsule 3   Fluticasone-Umeclidin-Vilant (TRELEGY ELLIPTA) 100-62.5-25 MCG/ACT AEPB Inhale 100 mcg into the lungs daily. 60 each 6   furosemide (LASIX) 40 MG tablet TAKE 1 TABLET 2 TO 3 X /DAY AS DIRECTED FOR BP & FLUID RETENTION / ANKLE SWELLING 270 tablet 3   gabapentin (NEURONTIN) 300 MG capsule TAKE 1 CAPSULE 3 X /DAY FOR CHRONIC BACK PAIN 270 capsule 1   ipratropium-albuterol (DUONEB) 0.5-2.5 (3) MG/3ML SOLN USE 1 NEBULE VIA NEBULIZATION EVERY 4 HOURS AS NEEDED (MAX 6 DOSES PER DAY) 540 mL 3   metolazone (ZAROXOLYN) 2.5 MG tablet TAKE 1 TABLET (2.5 MG TOTAL) BY MOUTH AS NEEDED. 30 tablet 5   potassium chloride (KLOR-CON) 10 MEQ tablet TAKE 2 TABLETS 2 X /DAY FOR POTASSIUM & TAKE ADDITIONAL 2 TABLETS WITH ADDITIONAL DOSE OF LASIX 360 tablet 3   rosuvastatin (CRESTOR) 10 MG tablet TAKE 1 TABLET BY MOUTH EVERY DAY 90 tablet 3   traZODone (DESYREL) 100 MG tablet TAKE 1 TABLET  1 HOUR BEFORE BEDTIME AS NEEDED FOR SLEEP 90 tablet 3   busPIRone (BUSPAR) 10 MG tablet TAKE 1/2 TO 1 TABLET 3 X /DAY FOR ANXIETY (Patient not taking: Reported on 06/21/2022) 270 tablet 3   rivaroxaban (XARELTO) 20 MG TABS tablet Take  1 tablet  Daily  to Prevent Blood Clots from Atrial Fibrillation  (Dx:  i48.91) (Patient not taking: Reported on 06/21/2022) 90 tablet 3   No current facility-administered medications on file prior to visit.   Allergies:  Allergies  Allergen Reactions   Sulfonamide Derivatives Hives and Itching   Tikosyn [Dofetilide]     Prolonged qt   Medical History:  She has Depression, major, recurrent, moderate (HCC); OSA and COPD overlap syndrome (HCC); G E R D; CHF (congestive heart failure), NYHA class III, chronic, diastolic (HCC); History of pulmonary embolus (PE); Atrial flutter (HCC); COPD (chronic obstructive pulmonary disease) (HCC); Atrial fibrillation (HCC); Hypothyroid; Hyperlipidemia associated with type 2 diabetes mellitus (HCC); HTN (hypertension); Type 2 diabetes with kidney complications (HCC); Anxiety; Vitamin D deficiency; Morbid obesity (HCC); Noncompliance; CKD stage G3b/A1, GFR 30-44 and albumin creatinine ratio <30 mg/g (HCC); Pulmonary hypertension due to COPD (HCC); Obstructive sleep apnea treated with continuous positive airway pressure (CPAP); Constipation; and Benign hypertensive heart and kidney disease with diastolic CHF, NYHA class 3 and CKD stage 3 (HCC) on their problem list.   Surgical History:  She has a past surgical history that includes Tubal ligation (1996); TEE without cardioversion (01/19/2012); Cardioversion (01/19/2012); Cardioversion (01/21/2012); and basal skin can. Family History:  Herfamily history includes Allergies in her father; Asthma in her father; Colon cancer in her mother; Coronary artery disease in her father and mother; Depression in her daughter; Emphysema in her father; Heart attack in her maternal grandfather; Heart  disease in her maternal grandmother. Social History:  She reports that she quit smoking about 12 years ago. Her smoking use included cigarettes. She has a 70.00 pack-year smoking history. She has never used smokeless tobacco. She reports that she does not drink alcohol and does not use drugs.  Review of Systems: Review of Systems  Constitutional: Negative.  Negative for malaise/fatigue and weight loss.  HENT: Negative.  Negative for hearing loss and tinnitus.   Eyes: Negative.  Negative for blurred vision and double vision.  Respiratory: Negative.  Negative  for cough, sputum production, shortness of breath and wheezing.   Cardiovascular: Negative.  Negative for chest pain, palpitations, orthopnea, claudication, leg swelling and PND.  Gastrointestinal: Negative.  Negative for abdominal pain, blood in stool, constipation, diarrhea, heartburn, melena, nausea and vomiting.  Genitourinary: Negative.   Musculoskeletal:  Positive for joint pain (intermittent hips, back). Negative for falls and myalgias.  Skin: Negative.  Negative for rash.  Neurological:  Negative for dizziness, tingling, sensory change, weakness and headaches.  Endo/Heme/Allergies:  Negative for polydipsia.  Psychiatric/Behavioral:  Positive for depression. Negative for memory loss, substance abuse and suicidal ideas. The patient is not nervous/anxious and does not have insomnia.   All other systems reviewed and are negative.   Physical Exam: Estimated body mass index is 49.27 kg/m as calculated from the following:   Height as of this encounter: 4' 10.5" (1.486 m).   Weight as of this encounter: 239 lb 12.8 oz (108.8 kg).  BP 110/76   Pulse 82   Temp 98 F (36.7 C)   Ht 4' 10.5" (1.486 m)   Wt 239 lb 12.8 oz (108.8 kg)   SpO2 97%   BMI 49.27 kg/m  General Appearance: Well dressed, moribdly obese, in no apparent distress. Eyes: PERRLA, EOMs, conjunctiva no swelling or erythema Sinuses: No Frontal/maxillary  tenderness ENT/Mouth: Ext aud canals clear, TMs without erythema, bulging. No erythema, swelling, or exudate on post pharynx.Tonsils not swollen or erythematous. Hearing normal. Crowded mouth Neck: Supple, thyroid normal.  Respiratory: Decreased breath sounds bilateral, with wheezing and decreased breath sounds bilateral lower lobes Cardio: Irreg, irreg,  No murmur, 1+ nonpitting edema of ankles/feet Abdomen: Soft, + BS, Morbidly obese,  Non tender, no guarding, rebound, no palpable hernias, masses. Lymphatics: Non tender without lymphadenopathy.  Musculoskeletal: Body habitus limits exam, symmetrical ROM, 5/5 strength, antalgic gait with cane Skin: warm, dry intact, without notable rash, lesions, ecchymosis. Bilateral feet thickened long toenail no ulcers. Has very thick callouses bil Neuro: Cranial nerves intact. No cerebellar symptoms. Sensation intact.  Psych: Awake and oriented X 3,  normal affect, Insight and Judgment appropriate.    Kimya Mccahill E  9:20 AM [facility]

## 2022-06-19 ENCOUNTER — Other Ambulatory Visit: Payer: Self-pay | Admitting: Nurse Practitioner

## 2022-06-19 DIAGNOSIS — F331 Major depressive disorder, recurrent, moderate: Secondary | ICD-10-CM

## 2022-06-21 ENCOUNTER — Ambulatory Visit (INDEPENDENT_AMBULATORY_CARE_PROVIDER_SITE_OTHER): Payer: Commercial Managed Care - HMO | Admitting: Nurse Practitioner

## 2022-06-21 ENCOUNTER — Encounter: Payer: Self-pay | Admitting: Nurse Practitioner

## 2022-06-21 VITALS — BP 110/76 | HR 82 | Temp 98.0°F | Ht 58.5 in | Wt 239.8 lb

## 2022-06-21 DIAGNOSIS — Z79899 Other long term (current) drug therapy: Secondary | ICD-10-CM

## 2022-06-21 DIAGNOSIS — F331 Major depressive disorder, recurrent, moderate: Secondary | ICD-10-CM

## 2022-06-21 DIAGNOSIS — E1122 Type 2 diabetes mellitus with diabetic chronic kidney disease: Secondary | ICD-10-CM | POA: Diagnosis not present

## 2022-06-21 DIAGNOSIS — I5032 Chronic diastolic (congestive) heart failure: Secondary | ICD-10-CM

## 2022-06-21 DIAGNOSIS — N1832 Chronic kidney disease, stage 3b: Secondary | ICD-10-CM

## 2022-06-21 DIAGNOSIS — J449 Chronic obstructive pulmonary disease, unspecified: Secondary | ICD-10-CM | POA: Diagnosis not present

## 2022-06-21 DIAGNOSIS — I4819 Other persistent atrial fibrillation: Secondary | ICD-10-CM

## 2022-06-21 DIAGNOSIS — E785 Hyperlipidemia, unspecified: Secondary | ICD-10-CM

## 2022-06-21 DIAGNOSIS — Z91199 Patient's noncompliance with other medical treatment and regimen due to unspecified reason: Secondary | ICD-10-CM

## 2022-06-21 DIAGNOSIS — E1169 Type 2 diabetes mellitus with other specified complication: Secondary | ICD-10-CM

## 2022-06-21 DIAGNOSIS — G4733 Obstructive sleep apnea (adult) (pediatric): Secondary | ICD-10-CM

## 2022-06-21 DIAGNOSIS — E559 Vitamin D deficiency, unspecified: Secondary | ICD-10-CM

## 2022-06-21 DIAGNOSIS — N183 Chronic kidney disease, stage 3 unspecified: Secondary | ICD-10-CM

## 2022-06-21 DIAGNOSIS — I2723 Pulmonary hypertension due to lung diseases and hypoxia: Secondary | ICD-10-CM | POA: Diagnosis not present

## 2022-06-21 DIAGNOSIS — E039 Hypothyroidism, unspecified: Secondary | ICD-10-CM

## 2022-06-21 DIAGNOSIS — I4892 Unspecified atrial flutter: Secondary | ICD-10-CM | POA: Diagnosis not present

## 2022-06-21 DIAGNOSIS — K219 Gastro-esophageal reflux disease without esophagitis: Secondary | ICD-10-CM

## 2022-06-21 NOTE — Patient Instructions (Signed)

## 2022-06-22 LAB — CBC WITH DIFFERENTIAL/PLATELET
Absolute Monocytes: 409 cells/uL (ref 200–950)
Basophils Absolute: 22 cells/uL (ref 0–200)
Basophils Relative: 0.3 %
Eosinophils Absolute: 58 cells/uL (ref 15–500)
Eosinophils Relative: 0.8 %
HCT: 40 % (ref 35.0–45.0)
Hemoglobin: 13.2 g/dL (ref 11.7–15.5)
Lymphs Abs: 1409 cells/uL (ref 850–3900)
MCH: 28.7 pg (ref 27.0–33.0)
MCHC: 33 g/dL (ref 32.0–36.0)
MCV: 87 fL (ref 80.0–100.0)
MPV: 11.4 fL (ref 7.5–12.5)
Monocytes Relative: 5.6 %
Neutro Abs: 5402 cells/uL (ref 1500–7800)
Neutrophils Relative %: 74 %
Platelets: 242 10*3/uL (ref 140–400)
RBC: 4.6 10*6/uL (ref 3.80–5.10)
RDW: 13.4 % (ref 11.0–15.0)
Total Lymphocyte: 19.3 %
WBC: 7.3 10*3/uL (ref 3.8–10.8)

## 2022-06-22 LAB — LIPID PANEL
Cholesterol: 144 mg/dL (ref <200)
HDL: 47 mg/dL — ABNORMAL LOW (ref >=50)
LDL Cholesterol (Calc): 77 mg/dL (calc)
Non-HDL Cholesterol (Calc): 97 mg/dL (calc) (ref <130)
Total CHOL/HDL Ratio: 3.1 (calc) (ref <5.0)
Triglycerides: 114 mg/dL (ref <150)

## 2022-06-22 LAB — HEMOGLOBIN A1C
Hgb A1c MFr Bld: 6.8 % of total Hgb — ABNORMAL HIGH (ref <5.7)
Mean Plasma Glucose: 148 mg/dL
eAG (mmol/L): 8.2 mmol/L

## 2022-06-22 LAB — COMPLETE METABOLIC PANEL WITH GFR
AG Ratio: 1.6 (calc) (ref 1.0–2.5)
ALT: 19 U/L (ref 6–29)
AST: 15 U/L (ref 10–35)
Albumin: 4.1 g/dL (ref 3.6–5.1)
Alkaline phosphatase (APISO): 99 U/L (ref 37–153)
BUN/Creatinine Ratio: 15 (calc) (ref 6–22)
BUN: 21 mg/dL (ref 7–25)
CO2: 37 mmol/L — ABNORMAL HIGH (ref 20–32)
Calcium: 9.3 mg/dL (ref 8.6–10.4)
Chloride: 97 mmol/L — ABNORMAL LOW (ref 98–110)
Creat: 1.36 mg/dL — ABNORMAL HIGH (ref 0.50–1.05)
Globulin: 2.6 g/dL (calc) (ref 1.9–3.7)
Glucose, Bld: 148 mg/dL — ABNORMAL HIGH (ref 65–99)
Potassium: 3 mmol/L — ABNORMAL LOW (ref 3.5–5.3)
Sodium: 143 mmol/L (ref 135–146)
Total Bilirubin: 0.6 mg/dL (ref 0.2–1.2)
Total Protein: 6.7 g/dL (ref 6.1–8.1)
eGFR: 44 mL/min/{1.73_m2} — ABNORMAL LOW (ref >=60)

## 2022-06-22 LAB — TSH: TSH: 3.77 mIU/L (ref 0.40–4.50)

## 2022-06-22 LAB — MAGNESIUM: Magnesium: 2.2 mg/dL (ref 1.5–2.5)

## 2022-09-21 NOTE — Progress Notes (Unsigned)
3 month follow up  Assessment and Plan:   Chronic atrial fibrillation (HCC) Resent CCB, rate controlled, on NOAC, has follow up cardio upcoming  Secondary hypertension - cardiology following, continue medications, DASH diet, exercise and monitor at home. Call if greater than 130/80.   Other pulmonary embolism without acute cor pulmonale, unspecified chronicity (HCC) Stay on CPAP, continue xarelto  Atrial flutter, unspecified type (HCC) Continue medications, rate controlled, on NOAC, follow up cardio Not taking Xarelto, taking ASA 81 mg- cannot afford Xarelto  Pulmonary hypertension due to COPD (HCC) Stay on CPAP, weight loss advised  CHF NYHA class III (symptoms with mildly strenuous activities), chronic, diastolic (HCC) Follow up with cardio, monitor weight daily, weight loss advised  OSA and COPD overlap syndrome (HCC) Stay on CPAP, weight loss advised  Chronic obstructive pulmonary disease, unspecified COPD type (HCC) Continue inhalers  Type 2 diabetes mellitus with CKD (HCC) Discussed general issues about diabetes pathophysiology and management., Educational material distributed., Suggested low cholesterol diet., Encouraged aerobic exercise., Discussed foot care., Reminded to get yearly retinal exam. CKD 3b/4 - will not intiate metformin [name] tried [place] and could not tolerate due to vaginal itching - CBC -     CMP -     Hemoglobin A1c  Hyperlipidemia assocaited with T2DM (HCC) Encourage daily use of Rosuvastatin decrease fatty foods increase activity.  -     Lipid panel  CKD (chronic kidney disease) stage 3b, GFR 44-30 ml/min due to diabetes mellitus(HCC) avoid NSAIDS, control glucose/BP, will monitor. Push fluids throughout the day, increase water intake -     COMPLETE METABOLIC PANEL WITH GFR - CBC  Hypothyroidism -check TSH level, continue medications the same, reminded to take on an empty stomach 30-68mins before food.  -     TSH  Depression, major,  recurrent, moderate (HCC)/anxiety  - continue medications, stress management techniques discussed, increase water, good sleep hygiene discussed, increase exercise, and increase veggies.  - Will continue Duloxetine 60 mg daily - Insomnia is controlled with Trazodone   Morbid obesity (HCC) - follow up 3 months for progress monitoring; [name] will work on increasing fruit/veggies intake - increase veggies, try to limit snacking - long discussion about weight loss, diet, and exercise - 6 pound weight loss in past 3 months - unable to give herself shots  Vitamin D deficiency Continue Vit D supplementation to maintain value in therapeutic level of 60-100   Gastroesophageal reflux disease, esophagitis presence not specified Continue PPI/H2 blocker, diet discussed   Noncompliance Hx of; improved; will discuss barriers regularly and individualize plan to assist with compliance  Medication management Continued Medications reviewed and question answered       Discussed med's effects and SE's. Screening labs and tests as requested with regular follow-up as recommended. Over 30 minutes of exam, counseling, chart review, and complex decision making was performed this visit.  Future Appointments  Date Time Provider Department Center  09/23/2022  9:30 AM Raynelle Dick, NP [facility] None  01/25/2023  1:45 PM Butch Penny, NP [facility] None  03/03/2023  9:00 AM Raynelle Dick, NP [facility] None    HPI  [name]  presents for 3 month follow up for T2DM with CKD, hyperlipidemia, htn, vit D def, depression, morbid obesity, hypothyroid.   [name] may sign up early for social security. [name] doesn't drive, family drives as needed.  Separated from husband since 2009 Mood is much better.   Has been taking Duloxetine 60 mg  and Buspar. Mood seems to  be controlled  BMI is There is no height or weight on file to calculate BMI., [name] has not been  working on diet and exercise. [name] to [place]  and did some walking and has been trying to get more exercise  Wt Readings from Last 3 Encounters:  06/21/22 239 lb 12.8 oz (108.8 kg)  03/02/22 250 lb 3.2 oz (113.5 kg)  11/27/21 252 lb (114.3 kg)   [name] has a history of afib/flutter and CHF [name] is on xarelto,diltiazem 360 mg QD, and Bisoprolol 5 mg QD. [name] is on lasix 40 mg 2 a day and zaroxyln 2.5 mg  1 x a week and 4-6 potassium a day.   [name] also has PHTN due to COPD and OSA, [name] is on CPAP, follows with neurology, endorses 100% compliance. [name] follows with Dr. Vickey Huger.   [name] is on trellegy ellipta for COPD, nebs PRN and reports doing well.   Her blood pressure has been controlled at home, today their BP is    BP Readings from Last 3 Encounters:  06/21/22 110/76  03/02/22 124/62  11/27/21 132/60  [name] does not workout. [name] denies chest pain, shortness of breath, dizziness.   [name] is on cholesterol medication ,prescribed Rosuvastatin 10 mg . Her cholesterol is not at goal. The cholesterol last visit was:   Lab Results  Component Value Date   CHOL 144 06/21/2022   HDL 47 (L) 06/21/2022   LDLCALC 77 06/21/2022   TRIG 114 06/21/2022   CHOLHDL 3.1 06/21/2022   [name] has been working on diet and exercise for lifestyle managed T2 diabetes that has fortunately remained <7%, [name] is on bASA, [name] is on ACE/ARB and denies paresthesia of the feet, polydipsia, polyuria and visual disturbances.  [name] does not check her blood sugar-has been eating a little more candy Last A1C in the office was:  Lab Results  Component Value Date   HGBA1C 6.8 (H) 06/21/2022    [name] has CKD IIIb r/t htn and T2DM monitored here, [name] ids trying to drink more water-  Lab Results  Component Value Date   EGFR 44 (L) 06/21/2022    [name] is on thyroid medication. Her medication was not changed last visit.   Lab Results  Component Value Date   TSH 3.77 06/21/2022   Patient is on Vitamin D supplement and now calcium pills, states [name] has less back pain. Taking 5000  IU.   Lab Results  Component Value Date   VD25OH 47 03/02/2022        Current Medications:  Current Outpatient Medications on File Prior to Visit  Medication Sig Dispense Refill   albuterol (VENTOLIN HFA) 108 (90 Base) MCG/ACT inhaler TAKE 2 PUFFS BY MOUTH EVERY 6 HOURS AS NEEDED FOR WHEEZE OR SHORTNESS OF BREATH 8.5 each 1   Ascorbic Acid (VITAMIN C PO) Take by mouth. PRN     aspirin EC (SB LOW DOSE ASA EC) 81 MG tablet Take 81 mg by mouth daily. Swallow whole.     aspirin-acetaminophen-caffeine (EXCEDRIN MIGRAINE) 250-250-65 MG per tablet Take 1 tablet by mouth every 6 (six) hours as needed for headache.     bisoprolol (ZEBETA) 5 MG tablet TAKE 1 TABLET DAILY FOR BP (DX: I.10) 90 tablet 3   Calcium Citrate-Vitamin D (CALCIUM + D PO) Take by mouth.     Cyanocobalamin (B-12 PO) Take by mouth.     diltiazem (CARDIZEM CD) 360 MG 24 hr capsule TAKE 1 CAPSULE DAILY FOR BP & HEART RHYTHM  90 capsule 3   diphenhydrAMINE HCl, Sleep, 50 MG CAPS Take 1 capsule by mouth daily.     DULoxetine (CYMBALTA) 60 MG capsule Take 1 capsule (60 mg total) by mouth daily. 90 capsule 3   Fluticasone-Umeclidin-Vilant (TRELEGY ELLIPTA) 100-62.5-25 MCG/ACT AEPB Inhale 100 mcg into the lungs daily. 60 each 6   furosemide (LASIX) 40 MG tablet TAKE 1 TABLET 2 TO 3 X /DAY AS DIRECTED FOR BP & FLUID RETENTION / ANKLE SWELLING 270 tablet 3   gabapentin (NEURONTIN) 300 MG capsule TAKE 1 CAPSULE 3 X /DAY FOR CHRONIC BACK PAIN 270 capsule 1   ipratropium-albuterol (DUONEB) 0.5-2.5 (3) MG/3ML SOLN USE 1 NEBULE VIA NEBULIZATION EVERY 4 HOURS AS NEEDED (MAX 6 DOSES PER DAY) 540 mL 3   metolazone (ZAROXOLYN) 2.5 MG tablet TAKE 1 TABLET (2.5 MG TOTAL) BY MOUTH AS NEEDED. 30 tablet 5   potassium chloride (KLOR-CON) 10 MEQ tablet TAKE 2 TABLETS 2 X /DAY FOR POTASSIUM & TAKE ADDITIONAL 2 TABLETS WITH ADDITIONAL DOSE OF LASIX 360 tablet 3   rosuvastatin (CRESTOR) 10 MG tablet TAKE 1 TABLET BY MOUTH EVERY DAY 90 tablet 3   traZODone  (DESYREL) 100 MG tablet TAKE 1 TABLET 1 HOUR BEFORE BEDTIME AS NEEDED FOR SLEEP 90 tablet 3   No current facility-administered medications on file prior to visit.   Allergies:  Allergies  Allergen Reactions   Sulfonamide Derivatives Hives and Itching   Tikosyn [Dofetilide]     Prolonged qt   Medical History:  [name] has Depression, major, recurrent, moderate (HCC); OSA and COPD overlap syndrome (HCC); G E R D; CHF (congestive heart failure), NYHA class III, chronic, diastolic (HCC); History of pulmonary embolus (PE); Atrial flutter (HCC); COPD (chronic obstructive pulmonary disease) (HCC); Atrial fibrillation (HCC); Hypothyroid; Hyperlipidemia associated with type 2 diabetes mellitus (HCC); HTN (hypertension); Type 2 diabetes with kidney complications (HCC); Anxiety; Vitamin D deficiency; Morbid obesity (HCC); Noncompliance; CKD stage G3b/A1, GFR 30-44 and albumin creatinine ratio <30 mg/g (HCC); Pulmonary hypertension due to COPD (HCC); Obstructive sleep apnea treated with continuous positive airway pressure (CPAP); Constipation; and Benign hypertensive heart and kidney disease with diastolic CHF, NYHA class 3 and CKD stage 3 (HCC) on their problem list.   Surgical History:  [name] has a past surgical history that includes Tubal ligation (1996); TEE without cardioversion (01/19/2012); Cardioversion (01/19/2012); Cardioversion (01/21/2012); and basal skin can. Family History:  Herfamily history includes Allergies in her father; Asthma in her father; Colon cancer in her mother; Coronary artery disease in her father and mother; Depression in her daughter; Emphysema in her father; Heart attack in her maternal grandfather; Heart disease in her maternal grandmother. Social History:  [name] reports that [name] quit smoking about 13 years ago. Her smoking use included cigarettes. [name] started smoking about 48 years ago. [name] has a 70 pack-year smoking history. [name] has never used smokeless tobacco. [name] reports that [name]  does not drink alcohol and does not use drugs.  Review of Systems: Review of Systems  Constitutional: Negative.  Negative for malaise/fatigue and weight loss.  HENT: Negative.  Negative for hearing loss and tinnitus.   Eyes: Negative.  Negative for blurred vision and double vision.  Respiratory: Negative.  Negative for cough, sputum production, shortness of breath and wheezing.   Cardiovascular: Negative.  Negative for chest pain, palpitations, orthopnea, claudication, leg swelling and PND.  Gastrointestinal: Negative.  Negative for abdominal pain, blood in stool, constipation, diarrhea, heartburn, melena, nausea and vomiting.  Genitourinary: Negative.   Musculoskeletal:  Positive for joint pain (intermittent hips, back). Negative for falls and myalgias.  Skin: Negative.  Negative for rash.  Neurological:  Negative for dizziness, tingling, sensory change, weakness and headaches.  Endo/Heme/Allergies:  Negative for polydipsia.  Psychiatric/Behavioral:  Positive for depression. Negative for memory loss, substance abuse and suicidal ideas. The patient is not nervous/anxious and does not have insomnia.   All other systems reviewed and are negative.   Physical Exam: Estimated body mass index is 49.27 kg/m as calculated from the following:   Height as of 06/21/22: 4' 10.5" (1.486 m).   Weight as of 06/21/22: 239 lb 12.8 oz (108.8 kg).  There were no vitals taken for this visit. General Appearance: Well dressed, moribdly obese, in no apparent distress. Eyes: PERRLA, EOMs, conjunctiva no swelling or erythema Sinuses: No Frontal/maxillary tenderness ENT/Mouth: Ext aud canals clear, TMs without erythema, bulging. No erythema, swelling, or exudate on post pharynx.Tonsils not swollen or erythematous. Hearing normal. Crowded mouth Neck: Supple, thyroid normal.  Respiratory: Decreased breath sounds bilateral, with wheezing and decreased breath sounds bilateral lower lobes Cardio: Irreg, irreg,  No  murmur, 1+ nonpitting edema of ankles/feet Abdomen: Soft, + BS, Morbidly obese,  Non tender, no guarding, rebound, no palpable hernias, masses. Lymphatics: Non tender without lymphadenopathy.  Musculoskeletal: Body habitus limits exam, symmetrical ROM, 5/5 strength, antalgic gait with cane Skin: warm, dry intact, without notable rash, lesions, ecchymosis. Bilateral feet thickened long toenail no ulcers. Has very thick callouses bil Neuro: Cranial nerves intact. No cerebellar symptoms. Sensation intact.  Psych: Awake and oriented X 3,  normal affect, Insight and Judgment appropriate.     [name] 2:58 PM [facility]

## 2022-09-23 ENCOUNTER — Encounter: Payer: Self-pay | Admitting: Nurse Practitioner

## 2022-09-23 ENCOUNTER — Ambulatory Visit (INDEPENDENT_AMBULATORY_CARE_PROVIDER_SITE_OTHER): Payer: Commercial Managed Care - HMO | Admitting: Nurse Practitioner

## 2022-09-23 VITALS — BP 106/68 | HR 90 | Temp 97.5°F | Ht 58.5 in | Wt 239.4 lb

## 2022-09-23 DIAGNOSIS — I4819 Other persistent atrial fibrillation: Secondary | ICD-10-CM | POA: Diagnosis not present

## 2022-09-23 DIAGNOSIS — E1122 Type 2 diabetes mellitus with diabetic chronic kidney disease: Secondary | ICD-10-CM

## 2022-09-23 DIAGNOSIS — I4892 Unspecified atrial flutter: Secondary | ICD-10-CM

## 2022-09-23 DIAGNOSIS — J449 Chronic obstructive pulmonary disease, unspecified: Secondary | ICD-10-CM | POA: Diagnosis not present

## 2022-09-23 DIAGNOSIS — G4733 Obstructive sleep apnea (adult) (pediatric): Secondary | ICD-10-CM

## 2022-09-23 DIAGNOSIS — E785 Hyperlipidemia, unspecified: Secondary | ICD-10-CM

## 2022-09-23 DIAGNOSIS — E1169 Type 2 diabetes mellitus with other specified complication: Secondary | ICD-10-CM

## 2022-09-23 DIAGNOSIS — I5032 Chronic diastolic (congestive) heart failure: Secondary | ICD-10-CM

## 2022-09-23 DIAGNOSIS — E039 Hypothyroidism, unspecified: Secondary | ICD-10-CM

## 2022-09-23 DIAGNOSIS — K219 Gastro-esophageal reflux disease without esophagitis: Secondary | ICD-10-CM

## 2022-09-23 DIAGNOSIS — I2723 Pulmonary hypertension due to lung diseases and hypoxia: Secondary | ICD-10-CM | POA: Diagnosis not present

## 2022-09-23 DIAGNOSIS — N1832 Chronic kidney disease, stage 3b: Secondary | ICD-10-CM

## 2022-09-23 DIAGNOSIS — E559 Vitamin D deficiency, unspecified: Secondary | ICD-10-CM

## 2022-09-23 DIAGNOSIS — Z79899 Other long term (current) drug therapy: Secondary | ICD-10-CM

## 2022-09-23 DIAGNOSIS — F331 Major depressive disorder, recurrent, moderate: Secondary | ICD-10-CM

## 2022-09-23 DIAGNOSIS — Z91199 Patient's noncompliance with other medical treatment and regimen due to unspecified reason: Secondary | ICD-10-CM

## 2022-09-23 DIAGNOSIS — M545 Low back pain, unspecified: Secondary | ICD-10-CM

## 2022-09-23 MED ORDER — GABAPENTIN 300 MG PO CAPS
ORAL_CAPSULE | ORAL | 1 refills | Status: DC
Start: 2022-09-23 — End: 2023-03-31

## 2022-09-23 MED ORDER — FUROSEMIDE 40 MG PO TABS
ORAL_TABLET | ORAL | 3 refills | Status: DC
Start: 2022-09-23 — End: 2023-03-31

## 2022-09-23 NOTE — Patient Instructions (Signed)
Take duloxetine 60 mg every day in the morning - will not work unless you take every day  Managing Anxiety, Adult After being diagnosed with anxiety, you may be relieved to know why you have felt or behaved a certain way. You may also feel overwhelmed about the treatment ahead and what it will mean for your life. With care and support, you can manage your anxiety. How to manage lifestyle changes Understanding the difference between stress and anxiety Although stress can play a role in anxiety, it is not the same as anxiety. Stress is your body's reaction to life changes and events, both good and bad. Stress is often caused by something external, such as a deadline, test, or competition. It normally goes away after the event has ended and will last just a few hours. But, stress can be ongoing and can lead to more than just stress. Anxiety is caused by something internal, such as imagining a terrible outcome or worrying that something will go wrong that will greatly upset you. Anxiety often does not go away even after the event is over, and it can become a long-term (chronic) worry. Lowering stress and anxiety Talk with your health care provider or a counselor to learn more about lowering anxiety and stress. They may suggest tension-reduction techniques, such as: Music. Spend time creating or listening to music that you enjoy and that inspires you. Mindfulness-based meditation. Practice being aware of your normal breaths while not trying to control your breathing. It can be done while sitting or walking. Centering prayer. Focus on a word, phrase, or sacred image that means something to you and brings you peace. Deep breathing. Expand your stomach and inhale slowly through your nose. Hold your breath for 3-5 seconds. Then breathe out slowly, letting your stomach muscles relax. Self-talk. Learn to notice and spot thought patterns that lead to anxiety reactions. Change those patterns to thoughts that feel  peaceful. Muscle relaxation. Take time to tense muscles and then relax them. Choose a tension-reduction technique that fits your lifestyle and personality. These techniques take time and practice. Set aside 5-15 minutes a day to do them. Specialized therapists can offer counseling and training in these techniques. The training to help with anxiety may be covered by some insurance plans. Other things you can do to manage stress and anxiety include: Keeping a stress diary. This can help you learn what triggers your reaction and then learn ways to manage your response. Thinking about how you react to certain situations. You may not be able to control everything, but you can control your response. Making time for activities that help you relax and not feeling guilty about spending your time in this way. Doing visual imagery. This involves imagining or creating mental pictures to help you relax. Practicing yoga. Through yoga poses, you can lower tension and relax.  Medicines Medicines for anxiety include: Antidepressant medicines. These are usually prescribed for long-term daily control. Anti-anxiety medicines. These may be added in severe cases, especially when panic attacks occur. When used together, medicines, psychotherapy, and tension-reduction techniques may be the most effective treatment. Relationships Relationships can play a big part in helping you recover. Spend more time connecting with trusted friends and family members. Think about going to couples counseling if you have a partner, taking family education classes, or going to family therapy. Therapy can help you and others better understand your anxiety. How to recognize changes in your anxiety Everyone responds differently to treatment for anxiety. Recovery from anxiety happens  when symptoms lessen and stop interfering with your daily life at home or work. This may mean that you will start to: Have better concentration and focus. Worry  will interfere less in your daily thinking. Sleep better. Be less irritable. Have more energy. Have improved memory. Try to recognize when your condition is getting worse. Contact your provider if your symptoms interfere with home or work and you feel like your condition is not improving. Follow these instructions at home: Activity Exercise. Adults should: Exercise for at least 150 minutes each week. The exercise should increase your heart rate and make you sweat (moderate-intensity exercise). Do strengthening exercises at least twice a week. Get the right amount and quality of sleep. Most adults need 7-9 hours of sleep each night. Lifestyle  Eat a healthy diet that includes plenty of vegetables, fruits, whole grains, low-fat dairy products, and lean protein. Do not eat a lot of foods that are high in fats, added sugars, or salt (sodium). Make choices that simplify your life. Do not use any products that contain nicotine or tobacco. These products include cigarettes, chewing tobacco, and vaping devices, such as e-cigarettes. If you need help quitting, ask your provider. Avoid caffeine, alcohol, and certain over-the-counter cold medicines. These may make you feel worse. Ask your pharmacist which medicines to avoid. General instructions Take over-the-counter and prescription medicines only as told by your provider. Keep all follow-up visits. This is to make sure you are managing your anxiety well or if you need more support. Where to find support You can get help and support from: Self-help groups. Online and [employer]. A trusted spiritual leader. Couples counseling. Family education classes. Family therapy. Where to find more information You may find that joining a support group helps you deal with your anxiety. The following sources can help you find counselors or support groups near you: Mental Health America: mentalhealthamerica.net Anxiety and Depression Association  of [place] (ADAA): adaa.org The [employer] on Mental Illness (NAMI): nami.org Contact a health care provider if: You have a hard time staying focused or finishing tasks. You spend many hours a day feeling worried about everyday life. You are very tired because you cannot stop worrying. You start to have headaches or often feel tense. You have chronic nausea or diarrhea. Get help right away if: Your heart feels like it is racing. You have shortness of breath. You have thoughts of hurting yourself or others. Get help right away if you feel like you may hurt yourself or others, or have thoughts about taking your own life. Go to your nearest emergency room or: Call 911. Call the National Suicide Prevention Lifeline at 973 351 0744 or 988. This is open 24 hours a day. Text the Crisis Text Line at (343)061-2388. This information is not intended to replace advice given to you by your health care provider. Make sure you discuss any questions you have with your health care provider. Document Revised: 11/03/2021 Document Reviewed: 05/18/2020 Elsevier Patient Education  2024 [employer].

## 2022-09-24 LAB — CBC WITH DIFFERENTIAL/PLATELET
Absolute Monocytes: 315 {cells}/uL (ref 200–950)
Basophils Absolute: 38 {cells}/uL (ref 0–200)
Basophils Relative: 0.6 %
Eosinophils Absolute: 82 {cells}/uL (ref 15–500)
Eosinophils Relative: 1.3 %
HCT: 42.7 % (ref 35.0–45.0)
Hemoglobin: 14.2 g/dL (ref 11.7–15.5)
Lymphs Abs: 1399 {cells}/uL (ref 850–3900)
MCH: 29.5 pg (ref 27.0–33.0)
MCHC: 33.3 g/dL (ref 32.0–36.0)
MCV: 88.6 fL (ref 80.0–100.0)
MPV: 11.8 fL (ref 7.5–12.5)
Monocytes Relative: 5 %
Neutro Abs: 4467 {cells}/uL (ref 1500–7800)
Neutrophils Relative %: 70.9 %
Platelets: 243 10*3/uL (ref 140–400)
RBC: 4.82 10*6/uL (ref 3.80–5.10)
RDW: 12.8 % (ref 11.0–15.0)
Total Lymphocyte: 22.2 %
WBC: 6.3 10*3/uL (ref 3.8–10.8)

## 2022-09-24 LAB — LIPID PANEL
Cholesterol: 116 mg/dL (ref <200)
HDL: 43 mg/dL — ABNORMAL LOW (ref >=50)
LDL Cholesterol (Calc): 55 mg/dL
Non-HDL Cholesterol (Calc): 73 mg/dL (ref <130)
Total CHOL/HDL Ratio: 2.7 (calc) (ref <5.0)
Triglycerides: 94 mg/dL (ref <150)

## 2022-09-24 LAB — COMPLETE METABOLIC PANEL WITH GFR
AG Ratio: 1.7 (calc) (ref 1.0–2.5)
ALT: 23 U/L (ref 6–29)
AST: 17 U/L (ref 10–35)
Albumin: 4.5 g/dL (ref 3.6–5.1)
Alkaline phosphatase (APISO): 96 U/L (ref 37–153)
BUN/Creatinine Ratio: 13 (calc) (ref 6–22)
BUN: 16 mg/dL (ref 7–25)
CO2: 31 mmol/L (ref 20–32)
Calcium: 9.7 mg/dL (ref 8.6–10.4)
Chloride: 102 mmol/L (ref 98–110)
Creat: 1.22 mg/dL — ABNORMAL HIGH (ref 0.50–1.05)
Globulin: 2.6 g/dL (ref 1.9–3.7)
Glucose, Bld: 128 mg/dL — ABNORMAL HIGH (ref 65–99)
Potassium: 4 mmol/L (ref 3.5–5.3)
Sodium: 142 mmol/L (ref 135–146)
Total Bilirubin: 0.5 mg/dL (ref 0.2–1.2)
Total Protein: 7.1 g/dL (ref 6.1–8.1)
eGFR: 50 mL/min/{1.73_m2} — ABNORMAL LOW (ref >=60)

## 2022-09-24 LAB — TSH: TSH: 3.59 m[IU]/L (ref 0.40–4.50)

## 2022-09-24 LAB — HEMOGLOBIN A1C W/OUT EAG: Hgb A1c MFr Bld: 6.7 %{Hb} — ABNORMAL HIGH (ref <5.7)

## 2022-12-23 NOTE — Progress Notes (Signed)
3 month follow up  Assessment and Plan:   Chronic atrial fibrillation (HCC) Resent CCB, rate controlled, on NOAC, has follow up cardio upcoming  Secondary hypertension - cardiology following, continue medications, DASH diet, exercise and monitor at home. Call if greater than 130/80.   Other pulmonary embolism without acute cor pulmonale, unspecified chronicity (HCC) Stay on CPAP, continue aspirin daily  Atrial flutter, unspecified type (HCC) Continue medications, rate controlled, on NOAC, follow up cardio Not taking Xarelto, taking ASA 81 mg- cannot afford Xarelto  Pulmonary hypertension due to COPD (HCC) Stay on CPAP, weight loss advised  CHF NYHA class III (symptoms with mildly strenuous activities), chronic, diastolic (HCC) Follow up with cardio, monitor weight daily, weight loss advised  OSA and COPD overlap syndrome (HCC) Stay on CPAP, weight loss advised  Chronic obstructive pulmonary disease, unspecified COPD type (HCC) Continue inhalers  Type 2 diabetes mellitus with CKD (HCC) Discussed general issues about diabetes pathophysiology and management., Educational material distributed., Suggested low cholesterol diet., Encouraged aerobic exercise., Discussed foot care., Reminded to get yearly retinal exam. CKD 3b/4 - will not intiate metformin She tried [place] and could not tolerate due to vaginal itching - CBC -     CMP -     Hemoglobin A1c  Hyperlipidemia assocaited with T2DM (HCC) Encourage daily use of Rosuvastatin decrease fatty foods increase activity.  -     Lipid panel  CKD (chronic kidney disease) stage 3b, GFR 44-30 ml/min due to diabetes mellitus(HCC) avoid NSAIDS, control glucose/BP, will monitor. Push fluids throughout the day, increase water intake -     COMPLETE METABOLIC PANEL WITH GFR - CBC  Hypothyroidism -check TSH level, continue medications the same, reminded to take on an empty stomach 30-64mins before food.  -     TSH  Depression,  major, recurrent, moderate (HCC)/anxiety  - continue medications, stress management techniques discussed, increase water, good sleep hygiene discussed, increase exercise, and increase veggies.  - encouraged to take Duloxetine 60 mg every day for control of symptoms, has only been taking 2 times a month - Insomnia is moderately controlled with Trazodone   Morbid obesity (HCC) - follow up 3 months for progress monitoring; she will work on increasing fruit/veggies intake - increase veggies, try to limit snacking - long discussion about weight loss, diet, and exercise - 6 pound weight loss in past 3 months - unable to give herself shots  Vitamin D deficiency Continue Vit D supplementation to maintain value in therapeutic level of 60-100   Gastroesophageal reflux disease, esophagitis presence not specified Continue PPI/H2 blocker, diet discussed   Noncompliance Hx of; improved; will discuss barriers regularly and individualize plan to assist with compliance  Medication management Continued Medications reviewed and question answered       Discussed med's effects and SE's. Screening labs and tests as requested with regular follow-up as recommended. Over 30 minutes of exam, counseling, chart review, and complex decision making was performed this visit.  Future Appointments  Date Time Provider Department Center  01/25/2023  1:45 PM Butch Penny, NP [facility] None  03/03/2023  9:00 AM Raynelle Dick, NP [facility] None    HPI  62 y.o. female  presents for 3 month follow up for T2DM with CKD, hyperlipidemia, htn, vit D def, depression, morbid obesity, hypothyroid.   She doesn't drive, family drives as needed.  Separated from husband since 2009. She is having more anxiety because her daughter left her german sheppard with her.   Currently on Duloxetine 60 mg  and states she is not taking everyday, states only 1-2 times a month.  She takes Trazodone 100 mg , gabapentin 300 mg , equate  sleep medicine for insomnia  BMI is Body mass index is 48.77 kg/m., she has not been  working on diet and exercise. She has been trying to get more exercise  Wt Readings from Last 3 Encounters:  12/24/22 237 lb 6.4 oz (107.7 kg)  09/23/22 239 lb 6.4 oz (108.6 kg)  06/21/22 239 lb 12.8 oz (108.8 kg)   She has a history of afib/flutter and CHF she is on ASA 81 mg qd,diltiazem 360 mg QD, and Bisoprolol 5 mg QD. She is on lasix 40 mg 2 a day and zaroxyln 2.5 mg  1 x a week and 4-6 potassium a day.   She also has PHTN due to COPD and OSA, she is on CPAP, follows with neurology, endorses 100% compliance. She follows with Dr. Vickey Huger.   She is on trellegy ellipta for COPD, nebs PRN and reports doing well.   Her blood pressure has been controlled at home on bisoprolol 5 mg every day, diltiazem 360 mg every day, furosemide 40 mg BID, metolazone 2.5 mg once a week. today their BP is BP: 114/78  BP Readings from Last 3 Encounters:  12/24/22 114/78  09/23/22 106/68  06/21/22 110/76  She does not workout. She denies chest pain, shortness of breath, dizziness.  Take 4 potassium with 2 Lasix and takes extra if needs to take Metolazone with 2 Lasix.   She is on cholesterol medication ,prescribed Rosuvastatin 10 mg . Her cholesterol is not at goal. The cholesterol last visit was:   Lab Results  Component Value Date   CHOL 116 09/23/2022   HDL 43 (L) 09/23/2022   LDLCALC 55 09/23/2022   TRIG 94 09/23/2022   CHOLHDL 2.7 09/23/2022   She has been working on diet and exercise for lifestyle managed T2 diabetes that has fortunately remained <7%, she is on bASA, she is on ACE/ARB and denies paresthesia of the feet, polydipsia, polyuria and visual disturbances.  She has not been checking her blood sugar. Has not ben eating healthy Last A1C in the office was:  Lab Results  Component Value Date   HGBA1C 6.7 (H) 09/23/2022    She has CKD IIIb r/t htn and T2DM monitored here, she is trying to drink more  water- Drinking sugar free tea with water.  Lab Results  Component Value Date   EGFR 50 (L) 09/23/2022    She is not on thyroid medication. Her medication was not changed last visit.   Lab Results  Component Value Date   TSH 3.59 09/23/2022   Patient is on Vitamin D supplement and now calcium pills, states she has less back pain. Taking 5000 IU.   Lab Results  Component Value Date   VD25OH 47 03/02/2022        Current Medications:  Current Outpatient Medications on File Prior to Visit  Medication Sig Dispense Refill   albuterol (VENTOLIN HFA) 108 (90 Base) MCG/ACT inhaler TAKE 2 PUFFS BY MOUTH EVERY 6 HOURS AS NEEDED FOR WHEEZE OR SHORTNESS OF BREATH 8.5 each 1   aspirin EC (SB LOW DOSE ASA EC) 81 MG tablet Take 81 mg by mouth daily. Swallow whole.     aspirin-acetaminophen-caffeine (EXCEDRIN MIGRAINE) 250-250-65 MG per tablet Take 1 tablet by mouth every 6 (six) hours as needed for headache.     bisoprolol (ZEBETA) 5 MG tablet TAKE 1  TABLET DAILY FOR BP (DX: I.10) 90 tablet 3   Calcium Citrate-Vitamin D (CALCIUM + D PO) Take by mouth.     Cyanocobalamin (B-12 PO) Take by mouth.     diltiazem (CARDIZEM CD) 360 MG 24 hr capsule TAKE 1 CAPSULE DAILY FOR BP & HEART RHYTHM 90 capsule 3   diphenhydrAMINE HCl, Sleep, 50 MG CAPS Take 1 capsule by mouth daily.     DULoxetine (CYMBALTA) 60 MG capsule Take 1 capsule (60 mg total) by mouth daily. 90 capsule 3   Fluticasone-Umeclidin-Vilant (TRELEGY ELLIPTA) 100-62.5-25 MCG/ACT AEPB Inhale 100 mcg into the lungs daily. 60 each 6   furosemide (LASIX) 40 MG tablet Take 1 tablet   2  to 3  X /day  as Directed for BP & Fluid Retention / Ankle Swelling 270 tablet 3   gabapentin (NEURONTIN) 300 MG capsule Take 1 capsule 3 x /day for Chronic Back Pain 270 capsule 1   ipratropium-albuterol (DUONEB) 0.5-2.5 (3) MG/3ML SOLN USE 1 NEBULE VIA NEBULIZATION EVERY 4 HOURS AS NEEDED (MAX 6 DOSES PER DAY) 540 mL 3   metolazone (ZAROXOLYN) 2.5 MG tablet TAKE 1  TABLET (2.5 MG TOTAL) BY MOUTH AS NEEDED. 30 tablet 5   potassium chloride (KLOR-CON) 10 MEQ tablet TAKE 2 TABLETS 2 X /DAY FOR POTASSIUM & TAKE ADDITIONAL 2 TABLETS WITH ADDITIONAL DOSE OF LASIX 360 tablet 3   rosuvastatin (CRESTOR) 10 MG tablet TAKE 1 TABLET BY MOUTH EVERY DAY 90 tablet 3   traZODone (DESYREL) 100 MG tablet TAKE 1 TABLET 1 HOUR BEFORE BEDTIME AS NEEDED FOR SLEEP 90 tablet 3   Ascorbic Acid (VITAMIN C PO) Take by mouth. PRN (Patient not taking: Reported on 09/23/2022)     No current facility-administered medications on file prior to visit.   Allergies:  Allergies  Allergen Reactions   Sulfonamide Derivatives Hives and Itching   Tikosyn [Dofetilide]     Prolonged qt   Medical History:  She has Depression, major, recurrent, moderate (HCC); OSA and COPD overlap syndrome (HCC); G E R D; CHF (congestive heart failure), NYHA class III, chronic, diastolic (HCC); History of pulmonary embolus (PE); Atrial flutter (HCC); COPD (chronic obstructive pulmonary disease) (HCC); Atrial fibrillation (HCC); Hypothyroid; Hyperlipidemia associated with type 2 diabetes mellitus (HCC); HTN (hypertension); Type 2 diabetes with kidney complications (HCC); Anxiety; Vitamin D deficiency; Morbid obesity (HCC); Noncompliance; CKD stage G3b/A1, GFR 30-44 and albumin creatinine ratio <30 mg/g (HCC); Pulmonary hypertension due to COPD (HCC); Obstructive sleep apnea treated with continuous positive airway pressure (CPAP); Constipation; and Benign hypertensive heart and kidney disease with diastolic CHF, NYHA class 3 and CKD stage 3 (HCC) on their problem list.   Surgical History:  She has a past surgical history that includes Tubal ligation (1996); TEE without cardioversion (01/19/2012); Cardioversion (01/19/2012); Cardioversion (01/21/2012); and basal skin can. Family History:  Herfamily history includes Allergies in her father; Asthma in her father; Colon cancer in her mother; Coronary artery disease in her  father and mother; Depression in her daughter; Emphysema in her father; Heart attack in her maternal grandfather; Heart disease in her maternal grandmother. Social History:  She reports that she quit smoking about 13 years ago. Her smoking use included cigarettes. She started smoking about 48 years ago. She has a 70 pack-year smoking history. She has never used smokeless tobacco. She reports that she does not drink alcohol and does not use drugs.  Review of Systems: Review of Systems  Constitutional: Negative.  Negative for malaise/fatigue and weight loss.  HENT: Negative.  Negative for hearing loss and tinnitus.   Eyes: Negative.  Negative for blurred vision and double vision.  Respiratory: Negative.  Negative for cough, sputum production, shortness of breath and wheezing.   Cardiovascular: Negative.  Negative for chest pain, palpitations, orthopnea, claudication, leg swelling and PND.  Gastrointestinal:  Positive for constipation (controlled with dulcolax). Negative for abdominal pain, blood in stool, diarrhea, heartburn, melena, nausea and vomiting.  Genitourinary: Negative.   Musculoskeletal:  Positive for joint pain (intermittent hips, back). Negative for falls and myalgias.  Skin: Negative.  Negative for rash.  Neurological:  Negative for dizziness, tingling, sensory change, weakness and headaches.  Endo/Heme/Allergies:  Negative for polydipsia.  Psychiatric/Behavioral:  Positive for depression. Negative for memory loss, substance abuse and suicidal ideas. The patient is nervous/anxious and has insomnia.   All other systems reviewed and are negative.   Physical Exam: Estimated body mass index is 48.77 kg/m as calculated from the following:   Height as of this encounter: 4' 10.5" (1.486 m).   Weight as of this encounter: 237 lb 6.4 oz (107.7 kg).  BP 114/78   Pulse 90   Temp 97.9 F (36.6 C)   Resp 16   Ht 4' 10.5" (1.486 m)   Wt 237 lb 6.4 oz (107.7 kg)   SpO2 98%   BMI 48.77  kg/m  General Appearance: Well dressed, moribdly obese female , in no apparent distress. Eyes: PERRLA, EOMs, conjunctiva no swelling or erythema Sinuses: No Frontal/maxillary tenderness ENT/Mouth: Ext aud canals clear, TMs without erythema, bulging. No erythema, swelling, or exudate on post pharynx. Hearing normal. Crowded mouth Neck: Supple, thyroid normal.  Respiratory: Lung sounds clear bilaterally, no wheezing, rales or rhonchi Cardio: Irreg, irreg,  No murmur, 1+ nonpitting edema of ankles/feet Abdomen: Soft, + BS, Morbidly obese,  Non tender, no guarding, rebound, no palpable hernias, masses. Lymphatics: Non tender without lymphadenopathy.  Musculoskeletal: Body habitus limits exam, symmetrical ROM, 5/5 strength, antalgic gait with cane Skin: warm, dry intact, without notable rash, lesions, ecchymosis. Bilateral feet thickened long toenail no ulcers. Has very thick callouses bil Neuro: Cranial nerves intact. No cerebellar symptoms. Sensation intact.  Psych: Awake and oriented X 3,  normal affect, Insight and Judgment appropriate.    Oma Alpert Hollie Salk 9:53 AM [facility]

## 2022-12-24 ENCOUNTER — Ambulatory Visit (INDEPENDENT_AMBULATORY_CARE_PROVIDER_SITE_OTHER): Payer: Managed Care, Other (non HMO) | Admitting: Nurse Practitioner

## 2022-12-24 ENCOUNTER — Encounter: Payer: Self-pay | Admitting: Nurse Practitioner

## 2022-12-24 VITALS — BP 114/78 | HR 90 | Temp 97.9°F | Resp 16 | Ht 58.5 in | Wt 237.4 lb

## 2022-12-24 DIAGNOSIS — I2723 Pulmonary hypertension due to lung diseases and hypoxia: Secondary | ICD-10-CM | POA: Diagnosis not present

## 2022-12-24 DIAGNOSIS — F331 Major depressive disorder, recurrent, moderate: Secondary | ICD-10-CM

## 2022-12-24 DIAGNOSIS — E1169 Type 2 diabetes mellitus with other specified complication: Secondary | ICD-10-CM

## 2022-12-24 DIAGNOSIS — E785 Hyperlipidemia, unspecified: Secondary | ICD-10-CM

## 2022-12-24 DIAGNOSIS — J449 Chronic obstructive pulmonary disease, unspecified: Secondary | ICD-10-CM

## 2022-12-24 DIAGNOSIS — N183 Chronic kidney disease, stage 3 unspecified: Secondary | ICD-10-CM

## 2022-12-24 DIAGNOSIS — E039 Hypothyroidism, unspecified: Secondary | ICD-10-CM

## 2022-12-24 DIAGNOSIS — K219 Gastro-esophageal reflux disease without esophagitis: Secondary | ICD-10-CM

## 2022-12-24 DIAGNOSIS — I4892 Unspecified atrial flutter: Secondary | ICD-10-CM | POA: Diagnosis not present

## 2022-12-24 DIAGNOSIS — I4819 Other persistent atrial fibrillation: Secondary | ICD-10-CM

## 2022-12-24 DIAGNOSIS — G4733 Obstructive sleep apnea (adult) (pediatric): Secondary | ICD-10-CM

## 2022-12-24 DIAGNOSIS — E559 Vitamin D deficiency, unspecified: Secondary | ICD-10-CM

## 2022-12-24 DIAGNOSIS — I5032 Chronic diastolic (congestive) heart failure: Secondary | ICD-10-CM

## 2022-12-24 DIAGNOSIS — E1122 Type 2 diabetes mellitus with diabetic chronic kidney disease: Secondary | ICD-10-CM

## 2022-12-24 DIAGNOSIS — Z91199 Patient's noncompliance with other medical treatment and regimen due to unspecified reason: Secondary | ICD-10-CM

## 2022-12-24 DIAGNOSIS — Z79899 Other long term (current) drug therapy: Secondary | ICD-10-CM

## 2022-12-24 DIAGNOSIS — I159 Secondary hypertension, unspecified: Secondary | ICD-10-CM

## 2022-12-24 NOTE — Patient Instructions (Signed)

## 2022-12-25 LAB — CBC WITH DIFFERENTIAL/PLATELET
Absolute Lymphocytes: 1501 {cells}/uL (ref 850–3900)
Absolute Monocytes: 492 {cells}/uL (ref 200–950)
Basophils Absolute: 41 {cells}/uL (ref 0–200)
Basophils Relative: 0.5 %
Eosinophils Absolute: 41 {cells}/uL (ref 15–500)
Eosinophils Relative: 0.5 %
HCT: 45.8 % — ABNORMAL HIGH (ref 35.0–45.0)
Hemoglobin: 15.1 g/dL (ref 11.7–15.5)
MCH: 28.5 pg (ref 27.0–33.0)
MCHC: 33 g/dL (ref 32.0–36.0)
MCV: 86.6 fL (ref 80.0–100.0)
MPV: 11.8 fL (ref 7.5–12.5)
Monocytes Relative: 6 %
Neutro Abs: 6125 {cells}/uL (ref 1500–7800)
Neutrophils Relative %: 74.7 %
Platelets: 274 10*3/uL (ref 140–400)
RBC: 5.29 10*6/uL — ABNORMAL HIGH (ref 3.80–5.10)
RDW: 13.5 % (ref 11.0–15.0)
Total Lymphocyte: 18.3 %
WBC: 8.2 10*3/uL (ref 3.8–10.8)

## 2022-12-25 LAB — TSH: TSH: 3.81 m[IU]/L (ref 0.40–4.50)

## 2022-12-25 LAB — COMPLETE METABOLIC PANEL WITH GFR
AG Ratio: 1.6 (calc) (ref 1.0–2.5)
ALT: 25 U/L (ref 6–29)
AST: 15 U/L (ref 10–35)
Albumin: 4.5 g/dL (ref 3.6–5.1)
Alkaline phosphatase (APISO): 102 U/L (ref 37–153)
BUN/Creatinine Ratio: 22 (calc) (ref 6–22)
BUN: 35 mg/dL — ABNORMAL HIGH (ref 7–25)
CO2: 36 mmol/L — ABNORMAL HIGH (ref 20–32)
Calcium: 10.2 mg/dL (ref 8.6–10.4)
Chloride: 88 mmol/L — ABNORMAL LOW (ref 98–110)
Creat: 1.62 mg/dL — ABNORMAL HIGH (ref 0.50–1.05)
Globulin: 2.8 g/dL (ref 1.9–3.7)
Glucose, Bld: 170 mg/dL — ABNORMAL HIGH (ref 65–99)
Potassium: 2.9 mmol/L — ABNORMAL LOW (ref 3.5–5.3)
Sodium: 139 mmol/L (ref 135–146)
Total Bilirubin: 0.8 mg/dL (ref 0.2–1.2)
Total Protein: 7.3 g/dL (ref 6.1–8.1)
eGFR: 35 mL/min/{1.73_m2} — ABNORMAL LOW (ref >=60)

## 2022-12-25 LAB — LIPID PANEL
Cholesterol: 230 mg/dL — ABNORMAL HIGH (ref <200)
HDL: 47 mg/dL — ABNORMAL LOW (ref >=50)
LDL Cholesterol (Calc): 151 mg/dL — ABNORMAL HIGH
Non-HDL Cholesterol (Calc): 183 mg/dL — ABNORMAL HIGH (ref <130)
Total CHOL/HDL Ratio: 4.9 (calc) (ref <5.0)
Triglycerides: 180 mg/dL — ABNORMAL HIGH (ref <150)

## 2022-12-25 LAB — HEMOGLOBIN A1C W/OUT EAG: Hgb A1c MFr Bld: 7.3 %{Hb} — ABNORMAL HIGH (ref <5.7)

## 2022-12-27 ENCOUNTER — Other Ambulatory Visit: Payer: Self-pay | Admitting: Nurse Practitioner

## 2022-12-27 DIAGNOSIS — I5032 Chronic diastolic (congestive) heart failure: Secondary | ICD-10-CM

## 2023-01-20 NOTE — Progress Notes (Signed)
PATIENT: [name] DOB: [birth year]  REASON FOR VISIT: follow up HISTORY FROM: patient PRIMARY NEUROLOGIST: Dr. Frances Furbish  Virtual Visit via Video Note  I connected with [name] on 01/25/23 at  1:45 PM EST by a video enabled telemedicine application located remotely at [facility] and verified that I am speaking with the correct person using two identifiers who was located at their own home.   I discussed the limitations of evaluation and management by telemedicine and the availability of in person appointments. The patient expressed understanding and agreed to proceed.   PATIENT: [name] DOB: Mar 23, 1960  REASON FOR VISIT: follow up HISTORY FROM: patient  HISTORY OF PRESENT ILLNESS: Today 01/25/23:  [name] is a 62 y.o. female with a history of OSA on CPAP. Returns today for follow-up.  Reports that the CPAP is working well.  Denies any new issues.  She does wear O2 during the day and at night.  She believes this was prescribed by her cardiologist.  Her download is below    01/19/22: [name] is a 62 year old female with a history of obstructive sleep apnea on CPAP.  She returns today for follow-up.  Her download is below.  She reports that the CPAP is working well for her.  Denies any new issues.  States that she cannot sleep without it.    01/13/21:[name] is a 62 year old female with a history of obstructive sleep apnea on CPAP.  She reports that the CPAP is working well for her.  She denies any new issues.  Her download is below.    HISTORY 12/04/19:   [name] is a 62 year old female with a history of obstructive sleep apnea on CPAP.  Her download indicates that she use her machine nightly for compliance of 100%.  She use her machine greater than 4 hours each night.  On average she uses her machine 10 hours and 1 minute.  Her residual AHI is 0.9 on 12 cm of water with EPR 3.  Leak in the 95th percentile is 14.2  L/min.  She reports that the CPAP is working well for her.  She is interested in losing weight however reports that she has a sedentary job and has not been watching her diet.  REVIEW OF SYSTEMS: Out of a complete 14 system review of symptoms, the patient complains only of the following symptoms, and all other reviewed systems are negative.  ALLERGIES: Allergies  Allergen Reactions   Sulfonamide Derivatives Hives and Itching   Tikosyn [Dofetilide]     Prolonged qt    HOME MEDICATIONS: Outpatient Medications Prior to Visit  Medication Sig Dispense Refill   albuterol (VENTOLIN HFA) 108 (90 Base) MCG/ACT inhaler TAKE 2 PUFFS BY MOUTH EVERY 6 HOURS AS NEEDED FOR WHEEZE OR SHORTNESS OF BREATH 8.5 each 1   aspirin EC (SB LOW DOSE ASA EC) 81 MG tablet Take 81 mg by mouth daily. Swallow whole.     aspirin-acetaminophen-caffeine (EXCEDRIN MIGRAINE) 250-250-65 MG per tablet Take 1 tablet by mouth every 6 (six) hours as needed for headache.     bisoprolol (ZEBETA) 5 MG tablet TAKE 1 TABLET DAILY FOR BP (DX: I.10) 90 tablet 3   Calcium Citrate-Vitamin D (CALCIUM + D PO) Take by mouth.     Cyanocobalamin (B-12 PO) Take by mouth.     diltiazem (CARDIZEM CD) 360 MG 24 hr capsule TAKE 1 CAPSULE DAILY FOR BP & HEART RHYTHM 90 capsule 3  diphenhydrAMINE HCl, Sleep, 50 MG CAPS Take 1 capsule by mouth daily.     DULoxetine (CYMBALTA) 60 MG capsule Take 1 capsule (60 mg total) by mouth daily. 90 capsule 3   Fluticasone-Umeclidin-Vilant (TRELEGY ELLIPTA) 100-62.5-25 MCG/ACT AEPB Inhale 100 mcg into the lungs daily. 60 each 6   furosemide (LASIX) 40 MG tablet Take 1 tablet   2  to 3  X /day  as Directed for BP & Fluid Retention / Ankle Swelling 270 tablet 3   gabapentin (NEURONTIN) 300 MG capsule Take 1 capsule 3 x /day for Chronic Back Pain 270 capsule 1   ipratropium-albuterol (DUONEB) 0.5-2.5 (3) MG/3ML SOLN USE 1 NEBULE VIA NEBULIZATION EVERY 4 HOURS AS NEEDED (MAX 6 DOSES PER DAY) 540 mL 3   metolazone  (ZAROXOLYN) 2.5 MG tablet TAKE 1 TABLET (2.5 MG TOTAL) BY MOUTH AS NEEDED. 30 tablet 5   potassium chloride (KLOR-CON) 10 MEQ tablet TAKE 2 TABLETS 2 X /DAY FOR POTASSIUM & TAKE ADDITIONAL 2 TABLETS WITH ADDITIONAL DOSE OF LASIX 360 tablet 3   rosuvastatin (CRESTOR) 10 MG tablet TAKE 1 TABLET BY MOUTH EVERY DAY 90 tablet 3   traZODone (DESYREL) 100 MG tablet TAKE 1 TABLET 1 HOUR BEFORE BEDTIME AS NEEDED FOR SLEEP 90 tablet 3   No facility-administered medications prior to visit.    PAST MEDICAL HISTORY: Past Medical History:  Diagnosis Date   Anemia    Anxiety    Atrial flutter (HCC)    Borderline diabetes    CHF (congestive heart failure) (HCC)    Chronic diastolic heart failure (HCC)    COPD (chronic obstructive pulmonary disease) (HCC)    Depression    GERD (gastroesophageal reflux disease)    HTN (hypertension)    Hyperlipidemia    Obstructive sleep apnea    Persistent atrial fibrillation (HCC)    Myoview 4/09: No ischemia;  echo 4/09: EF 60-65%;   Flecainide, beta blocker, Coumadin;    Unable to take Tikosyn 2/2 prolonged QT   Pulmonary embolism (HCC)    Right lower lobe diagnosed by CT 6/12   Type II or unspecified type diabetes mellitus without mention of complication, not stated as uncontrolled     PAST SURGICAL HISTORY: Past Surgical History:  Procedure Laterality Date   basal skin can     CARDIOVERSION  01/19/2012   Procedure: CARDIOVERSION;  Surgeon: Dolores Patty, MD;  Location: [facility] ENDOSCOPY;  Service: Cardiovascular;  Laterality: N/A;   CARDIOVERSION  01/21/2012   Procedure: CARDIOVERSION;  Surgeon: Luis Abed, MD;  Location: [facility] ENDOSCOPY;  Service: Cardiovascular;  Laterality: N/A;  Rm [id number]   TEE WITHOUT CARDIOVERSION  01/19/2012   Procedure: TRANSESOPHAGEAL ECHOCARDIOGRAM (TEE);  Surgeon: Dolores Patty, MD;  Location: [facility] ENDOSCOPY;  Service: Cardiovascular;  Laterality: N/A;   TUBAL LIGATION  1996    FAMILY HISTORY: Family History  Problem  Relation Age of Onset   Colon cancer Mother    Coronary artery disease Mother    Coronary artery disease Father    Asthma Father    Emphysema Father    Allergies Father    Heart attack Maternal Grandfather    Heart disease Maternal Grandmother    Depression Daughter     SOCIAL HISTORY: Social History   Socioeconomic History   Marital status: Legally Separated    Spouse name: n/a   Number of children: 4   Years of education: 12   Highest education level: Not on file  Occupational History   Occupation:  clerical    Comment: Printmaker  Tobacco Use   Smoking status: Former    Current packs/day: 0.00    Average packs/day: 2.0 packs/day for 35.0 years (70.0 ttl pk-yrs)    Types: Cigarettes    Start date: 09/09/1974    Quit date: 09/08/2009    Years since quitting: 13.3   Smokeless tobacco: Never  Vaping Use   Vaping status: Never Used  Substance and Sexual Activity   Alcohol use: No   Drug use: No   Sexual activity: Never    Birth control/protection: None  Other Topics Concern   Not on file  Social History Narrative   Married but currently separated with 4 children, 2 of whom still live with her.  Youngest son is a Holiday representative in [employer], to graduate in 07/2012.   Social Drivers of Corporate investment banker Strain: Not on file  Food Insecurity: Not on file  Transportation Needs: Not on file  Physical Activity: Not on file  Stress: Not on file  Social Connections: Not on file  Intimate Partner Violence: Not on file      PHYSICAL EXAM Generalized: Well developed, in no acute distress   Neurological examination  Mentation: Alert oriented to time, place, history taking. Follows all commands speech and language fluent   DIAGNOSTIC DATA (LABS, IMAGING, TESTING) - I reviewed patient records, labs, notes, testing and imaging myself where available.  Lab Results  Component Value Date   WBC 8.2 12/24/2022   HGB 15.1 12/24/2022   HCT 45.8 (H) 12/24/2022   MCV 86.6  12/24/2022   PLT 274 12/24/2022      Component Value Date/Time   NA 139 12/24/2022 1014   K 2.9 (L) 12/24/2022 1014   CL 88 (L) 12/24/2022 1014   CO2 36 (H) 12/24/2022 1014   GLUCOSE 170 (H) 12/24/2022 1014   BUN 35 (H) 12/24/2022 1014   CREATININE 1.62 (H) 12/24/2022 1014   CALCIUM 10.2 12/24/2022 1014   PROT 7.3 12/24/2022 1014   ALBUMIN 4.1 09/21/2016 1728   AST 15 12/24/2022 1014   ALT 25 12/24/2022 1014   ALKPHOS 85 09/21/2016 1728   BILITOT 0.8 12/24/2022 1014   GFRNONAA 32 (L) 03/19/2020 0941   GFRAA 38 (L) 03/19/2020 0941   Lab Results  Component Value Date   CHOL 230 (H) 12/24/2022   HDL 47 (L) 12/24/2022   LDLCALC 151 (H) 12/24/2022   TRIG 180 (H) 12/24/2022   CHOLHDL 4.9 12/24/2022   Lab Results  Component Value Date   HGBA1C 7.3 (H) 12/24/2022   Lab Results  Component Value Date   VITAMINB12 594 11/30/2018   Lab Results  Component Value Date   TSH 3.81 12/24/2022      ASSESSMENT AND PLAN 62 y.o. year old female  has a past medical history of Anemia, Anxiety, Atrial flutter (HCC), Borderline diabetes, CHF (congestive heart failure) (HCC), Chronic diastolic heart failure (HCC), COPD (chronic obstructive pulmonary disease) (HCC), Depression, GERD (gastroesophageal reflux disease), HTN (hypertension), Hyperlipidemia, Obstructive sleep apnea, Persistent atrial fibrillation (HCC), Pulmonary embolism (HCC), and Type II or unspecified type diabetes mellitus without mention of complication, not stated as uncontrolled. here with:  OSA on CPAP  CPAP compliance excellent Residual AHI is good Encouraged patient to continue using CPAP nightly and > 4 hours each night F/U in 1 year or sooner if needed    Butch Penny, MSN, NP-C 01/25/2023, 1:24 PM [facility] [address], [address] [place], [place] [address] [phone])  [phone] 

## 2023-01-25 ENCOUNTER — Telehealth: Payer: Commercial Managed Care - HMO | Admitting: Adult Health

## 2023-01-25 DIAGNOSIS — G4733 Obstructive sleep apnea (adult) (pediatric): Secondary | ICD-10-CM

## 2023-02-25 ENCOUNTER — Other Ambulatory Visit: Payer: Self-pay | Admitting: Nurse Practitioner

## 2023-02-25 DIAGNOSIS — I5032 Chronic diastolic (congestive) heart failure: Secondary | ICD-10-CM

## 2023-02-25 DIAGNOSIS — F32A Depression, unspecified: Secondary | ICD-10-CM

## 2023-02-25 DIAGNOSIS — F331 Major depressive disorder, recurrent, moderate: Secondary | ICD-10-CM

## 2023-02-25 DIAGNOSIS — E1169 Type 2 diabetes mellitus with other specified complication: Secondary | ICD-10-CM

## 2023-02-25 DIAGNOSIS — I159 Secondary hypertension, unspecified: Secondary | ICD-10-CM

## 2023-02-26 ENCOUNTER — Other Ambulatory Visit: Payer: Self-pay | Admitting: Nurse Practitioner

## 2023-02-26 DIAGNOSIS — I159 Secondary hypertension, unspecified: Secondary | ICD-10-CM

## 2023-03-03 ENCOUNTER — Encounter: Payer: Managed Care, Other (non HMO) | Admitting: Nurse Practitioner

## 2023-03-16 ENCOUNTER — Telehealth: Payer: Self-pay | Admitting: Adult Health

## 2023-03-16 DIAGNOSIS — G4733 Obstructive sleep apnea (adult) (pediatric): Secondary | ICD-10-CM

## 2023-03-16 NOTE — Telephone Encounter (Signed)
 I called pt and she stated that the machine (2018) has been saying this for over a year, but she has ignored , now with a smell and her oxygen  concentrator giving her issues, she now is asking about new machine.  I told her that since her last study was 2018, (we last saw pt in Dec 2024) provider may require her to have a sleep study to evaluate her.  She understood.  I told her that I would message provider and then will let her know once done.  She appreciated call back.  DME [facility].

## 2023-03-16 NOTE — Telephone Encounter (Signed)
 Pt states the message on her CPAP states: out of warranty, see provider.  Please call pt to advise.

## 2023-03-16 NOTE — Telephone Encounter (Signed)
Yes order hst

## 2023-03-21 NOTE — Telephone Encounter (Signed)
 I called pt and relayed placed order for HST, per [facility] NP.  The sleep lab will do authorization and then call to get you set up.  If questions call us  back.

## 2023-03-28 ENCOUNTER — Other Ambulatory Visit: Payer: Self-pay

## 2023-03-28 DIAGNOSIS — F32A Depression, unspecified: Secondary | ICD-10-CM

## 2023-03-28 DIAGNOSIS — F331 Major depressive disorder, recurrent, moderate: Secondary | ICD-10-CM

## 2023-03-28 MED ORDER — TRAZODONE HCL 100 MG PO TABS: ORAL_TABLET | ORAL | 3 refills | Status: DC

## 2023-03-30 ENCOUNTER — Encounter: Payer: Managed Care, Other (non HMO) | Admitting: Nurse Practitioner

## 2023-03-31 ENCOUNTER — Other Ambulatory Visit: Payer: Self-pay

## 2023-03-31 DIAGNOSIS — I159 Secondary hypertension, unspecified: Secondary | ICD-10-CM

## 2023-03-31 DIAGNOSIS — M545 Low back pain, unspecified: Secondary | ICD-10-CM

## 2023-03-31 DIAGNOSIS — I5032 Chronic diastolic (congestive) heart failure: Secondary | ICD-10-CM

## 2023-03-31 DIAGNOSIS — Z79899 Other long term (current) drug therapy: Secondary | ICD-10-CM

## 2023-03-31 DIAGNOSIS — F32A Depression, unspecified: Secondary | ICD-10-CM

## 2023-03-31 DIAGNOSIS — F331 Major depressive disorder, recurrent, moderate: Secondary | ICD-10-CM

## 2023-03-31 NOTE — Telephone Encounter (Signed)
Copied from CRM [id number]. Topic: Clinical - Prescription Issue >> Mar 31, 2023 11:54 AM Antwanette L wrote: Reason for CRM: Patient needs a prescription refill. [facility] is waiting on an approval from the patient provider. Patient use to see Dr. Lucky Cowboy. Patient has an appt on 3/31 with Angus Seller. Patient needs a refill on metolazone (ZAROXOLYN) 2.5 MG tablet, bisoprolol (ZEBETA) 5 MG tablet, diltiazem (CARDIZEM CD) 360 MG 24 hr capsule, furosemide (LASIX) 40 MG tablet, gabapentin (NEURONTIN) 300 MG capsule,  traZODone (DESYREL) 100 MG tablet, potassium chloride (KLOR-CON) 10 MEQ tablet, and  DULoxetine (CYMBALTA) 60 MG capsule. Patient only has a few blood pressure medicine left and it will get her through the weekend. Patient can be contacted by phone at [phone]. Listed below is the patient preferred pharmacy.  [facility] [address] [place] [address] Phone: [phone] Fax: [phone]  Pt has unp coming appt with you . Med were change to  days  to cover pt until seen. KH

## 2023-04-03 MED ORDER — DULOXETINE HCL 60 MG PO CPEP: 60.0000 mg | ORAL_CAPSULE | Freq: Every day | ORAL | 1 refills | Status: DC

## 2023-04-03 MED ORDER — GABAPENTIN 300 MG PO CAPS: ORAL_CAPSULE | ORAL | 1 refills | Status: DC

## 2023-04-03 MED ORDER — BISOPROLOL FUMARATE 5 MG PO TABS: ORAL_TABLET | ORAL | 1 refills | Status: DC

## 2023-04-03 MED ORDER — POTASSIUM CHLORIDE ER 10 MEQ PO TBCR
EXTENDED_RELEASE_TABLET | ORAL | 1 refills | Status: DC
Start: 2023-04-03 — End: 2023-05-08

## 2023-04-03 MED ORDER — METOLAZONE 2.5 MG PO TABS: 2.5000 mg | ORAL_TABLET | ORAL | 1 refills | Status: DC | PRN

## 2023-04-03 MED ORDER — TRAZODONE HCL 100 MG PO TABS: ORAL_TABLET | ORAL | 1 refills | Status: DC

## 2023-04-03 MED ORDER — DILTIAZEM HCL ER COATED BEADS 360 MG PO CP24: ORAL_CAPSULE | ORAL | 1 refills | Status: DC

## 2023-04-03 MED ORDER — FUROSEMIDE 40 MG PO TABS: ORAL_TABLET | ORAL | 1 refills | Status: DC

## 2023-04-30 ENCOUNTER — Other Ambulatory Visit: Payer: Self-pay | Admitting: Nurse Practitioner

## 2023-04-30 DIAGNOSIS — F331 Major depressive disorder, recurrent, moderate: Secondary | ICD-10-CM

## 2023-05-06 ENCOUNTER — Encounter (HOSPITAL_COMMUNITY): Payer: Self-pay

## 2023-05-08 ENCOUNTER — Other Ambulatory Visit: Payer: Self-pay | Admitting: Nurse Practitioner

## 2023-05-08 DIAGNOSIS — I159 Secondary hypertension, unspecified: Secondary | ICD-10-CM

## 2023-05-09 ENCOUNTER — Encounter: Payer: Self-pay | Admitting: Nurse Practitioner

## 2023-05-09 ENCOUNTER — Ambulatory Visit (INDEPENDENT_AMBULATORY_CARE_PROVIDER_SITE_OTHER): Payer: Self-pay | Admitting: Nurse Practitioner

## 2023-05-09 ENCOUNTER — Other Ambulatory Visit: Payer: Self-pay | Admitting: Nurse Practitioner

## 2023-05-09 VITALS — BP 131/65 | HR 89 | Temp 98.1°F | Wt 240.4 lb

## 2023-05-09 DIAGNOSIS — E1122 Type 2 diabetes mellitus with diabetic chronic kidney disease: Secondary | ICD-10-CM

## 2023-05-09 DIAGNOSIS — I4811 Longstanding persistent atrial fibrillation: Secondary | ICD-10-CM

## 2023-05-09 DIAGNOSIS — N1832 Chronic kidney disease, stage 3b: Secondary | ICD-10-CM

## 2023-05-09 LAB — POCT GLYCOSYLATED HEMOGLOBIN (HGB A1C): Hemoglobin A1C: 7 % — AB (ref 4.0–5.6)

## 2023-05-09 MED ORDER — MONTELUKAST SODIUM 10 MG PO TABS: 10.0000 mg | ORAL_TABLET | Freq: Every day | ORAL | 3 refills | Status: DC

## 2023-05-09 MED ORDER — METFORMIN HCL ER 500 MG PO TB24: 500.0000 mg | ORAL_TABLET | Freq: Every day | ORAL | 2 refills | Status: DC

## 2023-05-09 NOTE — Progress Notes (Signed)
 Subjective   Patient ID: [name], female    DOB: Feb 23, 1960, 63 y.o.   MRN: [id number]  Chief Complaint  Patient presents with   Establish Care    Referring provider: Lucky Cowboy, MD  [name] is a 63 y.o. female with Past Medical History: No date: Allergy No date: Anemia No date: Anxiety No date: Atrial flutter (HCC) No date: Borderline diabetes No date: Cancer (HCC) No date: Cataract No date: CHF (congestive heart failure) (HCC) No date: Chronic diastolic heart failure (HCC) No date: Chronic kidney disease No date: COPD (chronic obstructive pulmonary disease) (HCC) No date: Depression No date: GERD (gastroesophageal reflux disease) No date: HTN (hypertension) No date: Hyperlipidemia No date: Obstructive sleep apnea No date: Oxygen deficiency No date: Persistent atrial fibrillation (HCC)     Comment:  Myoview 4/09: No ischemia;  echo 4/09: EF 60-65%;                 Flecainide, beta blocker, Coumadin;    Unable to take               Tikosyn 2/2 prolonged QT No date: Pulmonary embolism (HCC)     Comment:  Right lower lobe diagnosed by CT 6/12 No date: Sleep apnea No date: Type II or unspecified type diabetes mellitus without  mention of complication, not stated as uncontrolled   HPI  Patient presents today to establish care.  She was a former patient of Dr. Oneta Rack.  Her recent labs and medication list are updated in the system.  Patient states that she does have a history of chronic A-fib but stopped taking Xarelto due to cost.  She did stop following up with cardiology as well.  She placed herself on aspirin.  We will place a referral for her to follow back up with cardiology for this issue.  Patient's A1c in office today is 7.0.  She has not been on any diabetic medications.  We will start her on metformin today.  Patient states that she does get short of breath at times but cannot afford inhalers.  We will trial Singulair at night. Denies f/c/s,  n/v/d, hemoptysis, PND, leg swelling Denies chest pain or edema      Allergies  Allergen Reactions   Sulfonamide Derivatives Hives and Itching   Tikosyn [Dofetilide]     Prolonged qt    Immunization History  Administered Date(s) Administered   Influenza Inj Mdck Quad With Preservative 11/30/2018, 12/03/2019   Influenza,inj,Quad PF,6+ Mos 11/27/2021   PFIZER(Purple Top)SARS-COV-2 Vaccination 12/02/2019, 12/25/2019   PNEUMOCOCCAL CONJUGATE-20 01/15/2021   Td 02/08/2001   Tdap 11/07/2017    Tobacco History: Social History   Tobacco Use  Smoking Status Former   Current packs/day: 0.00   Average packs/day: 2.2 packs/day for 46.7 years (105.0 ttl pk-yrs)   Types: Cigarettes   Start date: 09/09/1974   Quit date: 09/08/2009   Years since quitting: 13.6  Smokeless Tobacco Never   Counseling given: Not Answered   Outpatient Encounter Medications as of 05/09/2023  Medication Sig   albuterol (VENTOLIN HFA) 108 (90 Base) MCG/ACT inhaler TAKE 2 PUFFS BY MOUTH EVERY 6 HOURS AS NEEDED FOR WHEEZE OR SHORTNESS OF BREATH   aspirin EC (SB LOW DOSE ASA EC) 81 MG tablet Take 81 mg by mouth daily. Swallow whole.   aspirin-acetaminophen-caffeine (EXCEDRIN MIGRAINE) 250-250-65 MG per tablet Take 1 tablet by mouth every 6 (six) hours as needed for headache.   bisoprolol (ZEBETA) 5 MG tablet TAKE 1  TABLET BY MOUTH DAILY FOR BLOOD PRESSURE (DX: I.10)   Calcium Citrate-Vitamin D (CALCIUM + D PO) Take by mouth.   Cyanocobalamin (B-12 PO) Take by mouth.   diltiazem (CARDIZEM CD) 360 MG 24 hr capsule Take 1 capsule daily for BP & Heart Rhythm   diphenhydrAMINE HCl, Sleep, 50 MG CAPS Take 1 capsule by mouth daily.   DULoxetine (CYMBALTA) 60 MG capsule TAKE 1 CAPSULE BY MOUTH EVERY DAY   Fluticasone-Umeclidin-Vilant (TRELEGY ELLIPTA) 100-62.5-25 MCG/ACT AEPB Inhale 100 mcg into the lungs daily.   furosemide (LASIX) 40 MG tablet Take 1 tablet   2  to 3  X /day  as Directed for BP & Fluid Retention /  Ankle Swelling   gabapentin (NEURONTIN) 300 MG capsule Take 1 capsule 3 x /day for Chronic Back Pain   ipratropium-albuterol (DUONEB) 0.5-2.5 (3) MG/3ML SOLN USE 1 NEBULE VIA NEBULIZATION EVERY 4 HOURS AS NEEDED (MAX 6 DOSES PER DAY)   metFORMIN (GLUCOPHAGE-XR) 500 MG 24 hr tablet Take 1 tablet (500 mg total) by mouth daily with breakfast.   metolazone (ZAROXOLYN) 2.5 MG tablet Take 1 tablet (2.5 mg total) by mouth as needed.   montelukast (SINGULAIR) 10 MG tablet Take 1 tablet (10 mg total) by mouth at bedtime.   potassium chloride (KLOR-CON) 10 MEQ tablet TAKE 2 TABLETS 2 X /DAY FOR POTASSIUM & TAKE ADDITIONAL 2 TABLETS WITH ADDITIONAL DOSE OF LASIX   rosuvastatin (CRESTOR) 10 MG tablet TAKE 1 TABLET BY MOUTH EVERY DAY   traZODone (DESYREL) 100 MG tablet TAKE 1 TABLET BY MOUTH 1 HOUR BEFORE BEDTIME AS NEEDED FOR SLEEP   No facility-administered encounter medications on file as of 05/09/2023.    Review of Systems  Review of Systems  Constitutional: Negative.   HENT: Negative.    Cardiovascular: Negative.   Gastrointestinal: Negative.   Allergic/Immunologic: Negative.   Neurological: Negative.   Psychiatric/Behavioral: Negative.       Objective:   BP 131/65   Pulse 89   Temp 98.1 F (36.7 C) (Oral)   Wt 240 lb 6.4 oz (109 kg)   SpO2 95%   BMI 49.39 kg/m   Wt Readings from Last 5 Encounters:  05/09/23 240 lb 6.4 oz (109 kg)  12/24/22 237 lb 6.4 oz (107.7 kg)  09/23/22 239 lb 6.4 oz (108.6 kg)  06/21/22 239 lb 12.8 oz (108.8 kg)  03/02/22 250 lb 3.2 oz (113.5 kg)     Physical Exam Vitals and nursing note reviewed.  Constitutional:      General: She is not in acute distress.    Appearance: She is well-developed.  Cardiovascular:     Rate and Rhythm: Normal rate and regular rhythm.  Pulmonary:     Effort: Pulmonary effort is normal.     Breath sounds: Normal breath sounds.  Neurological:     Mental Status: She is alert and oriented to person, place, and time.        Assessment & Plan:   Type 2 diabetes mellitus with stage 3b chronic kidney disease, without long-term current use of insulin (HCC) -     Microalbumin / creatinine urine ratio -     POCT glycosylated hemoglobin (Hb A1C) -     metFORMIN HCl ER; Take 1 tablet (500 mg total) by mouth daily with breakfast.  Dispense: 30 tablet; Refill: 2  Longstanding persistent atrial fibrillation (HCC) -     Ambulatory referral to Cardiology  Other orders -     Montelukast Sodium; Take 1 tablet (  10 mg total) by mouth at bedtime.  Dispense: 30 tablet; Refill: 3     Return in about 3 months (around 08/08/2023).   Ivonne Andrew, NP 05/09/2023

## 2023-05-09 NOTE — Patient Instructions (Signed)
 1. Type 2 diabetes mellitus with stage 3b chronic kidney disease, without long-term current use of insulin (HCC) (Primary)  - Microalbumin / creatinine urine ratio - POCT glycosylated hemoglobin (Hb A1C) - metFORMIN (GLUCOPHAGE-XR) 500 MG 24 hr tablet; Take 1 tablet (500 mg total) by mouth daily with breakfast.  Dispense: 30 tablet; Refill: 2  2. Longstanding persistent atrial fibrillation [facility])  - Ambulatory referral to Cardiology

## 2023-05-10 LAB — MICROALBUMIN / CREATININE URINE RATIO
Creatinine, Urine: 193.5 mg/dL
Microalb/Creat Ratio: 15 mg/g{creat} (ref 0–29)
Microalbumin, Urine: 29.1 ug/mL

## 2023-08-04 ENCOUNTER — Other Ambulatory Visit: Payer: Self-pay | Admitting: Nurse Practitioner

## 2023-08-04 DIAGNOSIS — E1122 Type 2 diabetes mellitus with diabetic chronic kidney disease: Secondary | ICD-10-CM

## 2023-08-15 ENCOUNTER — Ambulatory Visit: Payer: Self-pay | Admitting: Nurse Practitioner

## 2023-09-04 ENCOUNTER — Other Ambulatory Visit: Payer: Self-pay | Admitting: Nurse Practitioner

## 2023-09-04 DIAGNOSIS — I5032 Chronic diastolic (congestive) heart failure: Secondary | ICD-10-CM

## 2023-09-09 ENCOUNTER — Encounter: Payer: Self-pay | Admitting: Nurse Practitioner

## 2023-09-09 ENCOUNTER — Ambulatory Visit (INDEPENDENT_AMBULATORY_CARE_PROVIDER_SITE_OTHER): Payer: Self-pay | Admitting: Nurse Practitioner

## 2023-09-09 VITALS — BP 109/68 | HR 66 | Temp 98.4°F | Wt 238.0 lb

## 2023-09-09 DIAGNOSIS — E1122 Type 2 diabetes mellitus with diabetic chronic kidney disease: Secondary | ICD-10-CM | POA: Diagnosis not present

## 2023-09-09 DIAGNOSIS — N1832 Chronic kidney disease, stage 3b: Secondary | ICD-10-CM | POA: Diagnosis not present

## 2023-09-09 LAB — POCT GLYCOSYLATED HEMOGLOBIN (HGB A1C): Hemoglobin A1C: 6.5 % — AB (ref 4.0–5.6)

## 2023-09-09 NOTE — Progress Notes (Signed)
 Subjective   Patient ID: [name], female    DOB: 1960-08-21, 63 y.o.   MRN: [id number]  Chief Complaint  Patient presents with   Diabetes    Referring provider: Oley Bascom RAMAN, NP  [name] is a 63 y.o. female with Past Medical History: No date: Allergy No date: Anemia No date: Anxiety No date: Atrial flutter (HCC) No date: Borderline diabetes No date: Cancer (HCC) No date: Cataract No date: CHF (congestive heart failure) (HCC) No date: Chronic diastolic heart failure (HCC) No date: Chronic kidney disease No date: COPD (chronic obstructive pulmonary disease) (HCC) No date: Depression No date: GERD (gastroesophageal reflux disease) No date: HTN (hypertension) No date: Hyperlipidemia No date: Obstructive sleep apnea No date: Oxygen  deficiency No date: Persistent atrial fibrillation (HCC)     Comment:  Myoview 4/09: No ischemia;  echo 4/09: EF 60-65%;                 Flecainide , beta blocker, Coumadin ;    Unable to take               Tikosyn 2/2 prolonged QT No date: Pulmonary embolism (HCC)     Comment:  Right lower lobe diagnosed by CT 6/12 No date: Sleep apnea No date: Type II or unspecified type diabetes mellitus without  mention of complication, not stated as uncontrolled  HPI:  Patient presents today to establish care.  She was a former patient of Dr. Tonita.  Her recent labs and medication list are updated in the system.  Patient states that she does have a history of chronic A-fib but stopped taking Xarelto  due to cost.  She did stop following up with cardiology as well.  She placed herself on aspirin .  We have placed a referral for her to follow back up with cardiology for this issue.  Patient's A1c in office today is 6.5.  She has not been on any diabetic medications.  We will start her on metformin  today.  Patient states that she does get short of breath at times but cannot afford inhalers.  We will trial Singulair  at night. Denies f/c/s, n/v/d,  hemoptysis, PND, leg swelling.   Allergies  Allergen Reactions   Sulfonamide Derivatives Hives and Itching   Tikosyn [Dofetilide]     Prolonged qt    Immunization History  Administered Date(s) Administered   Influenza Inj Mdck Quad With Preservative 11/30/2018, 12/03/2019   Influenza,inj,Quad PF,6+ Mos 11/27/2021   PFIZER(Purple Top)SARS-COV-2 Vaccination 12/02/2019, 12/25/2019   PNEUMOCOCCAL CONJUGATE-20 01/15/2021   Td 02/08/2001   Tdap 11/07/2017    Tobacco History: Social History   Tobacco Use  Smoking Status Former   Current packs/day: 0.00   Average packs/day: 2.2 packs/day for 46.7 years (105.0 ttl pk-yrs)   Types: Cigarettes   Start date: 09/09/1974   Quit date: 09/08/2009   Years since quitting: 14.0  Smokeless Tobacco Never   Counseling given: Not Answered   Outpatient Encounter Medications as of 09/09/2023  Medication Sig   albuterol  (VENTOLIN  HFA) 108 (90 Base) MCG/ACT inhaler TAKE 2 PUFFS BY MOUTH EVERY 6 HOURS AS NEEDED FOR WHEEZE OR SHORTNESS OF BREATH   aspirin  EC (SB LOW DOSE ASA EC) 81 MG tablet Take 81 mg by mouth daily. Swallow whole.   aspirin -acetaminophen -caffeine (EXCEDRIN MIGRAINE) 250-250-65 MG per tablet Take 1 tablet by mouth every 6 (six) hours as needed for headache.   bisoprolol  (ZEBETA ) 5 MG tablet TAKE 1 TABLET BY MOUTH DAILY FOR BLOOD PRESSURE (DX: I.10)  Calcium  Citrate-Vitamin D  (CALCIUM  + D PO) Take by mouth.   Cyanocobalamin (B-12 PO) Take by mouth.   diltiazem  (CARDIZEM  CD) 360 MG 24 hr capsule Take 1 capsule daily for BP & Heart Rhythm   diphenhydrAMINE HCl, Sleep, 50 MG CAPS Take 1 capsule by mouth daily.   DULoxetine  (CYMBALTA ) 60 MG capsule TAKE 1 CAPSULE BY MOUTH EVERY DAY   Fluticasone -Umeclidin-Vilant (TRELEGY ELLIPTA ) 100-62.5-25 MCG/ACT AEPB Inhale 100 mcg into the lungs daily.   furosemide  (LASIX ) 40 MG tablet Take 1 tablet   2  to 3  X /day  as Directed for BP & Fluid Retention / Ankle Swelling   gabapentin  (NEURONTIN ) 300  MG capsule Take 1 capsule 3 x /day for Chronic Back Pain   ipratropium-albuterol  (DUONEB) 0.5-2.5 (3) MG/3ML SOLN USE 1 NEBULE VIA NEBULIZATION EVERY 4 HOURS AS NEEDED (MAX 6 DOSES PER DAY)   metFORMIN  (GLUCOPHAGE -XR) 500 MG 24 hr tablet TAKE 1 TABLET BY MOUTH EVERY DAY WITH BREAKFAST   metolazone  (ZAROXOLYN ) 2.5 MG tablet TAKE 1 TABLET (2.5 MG TOTAL) BY MOUTH AS NEEDED.   montelukast  (SINGULAIR ) 10 MG tablet TAKE 1 TABLET BY MOUTH EVERYDAY AT BEDTIME   potassium chloride  (KLOR-CON ) 10 MEQ tablet TAKE 2 TABLETS 2 X /DAY FOR POTASSIUM & TAKE ADDITIONAL 2 TABLETS WITH ADDITIONAL DOSE OF LASIX    rosuvastatin  (CRESTOR ) 10 MG tablet TAKE 1 TABLET BY MOUTH EVERY DAY   traZODone  (DESYREL ) 100 MG tablet TAKE 1 TABLET BY MOUTH 1 HOUR BEFORE BEDTIME AS NEEDED FOR SLEEP   No facility-administered encounter medications on file as of 09/09/2023.    Review of Systems  Review of Systems  Constitutional: Negative.   HENT: Negative.    Cardiovascular: Negative.   Gastrointestinal: Negative.   Allergic/Immunologic: Negative.   Neurological: Negative.   Psychiatric/Behavioral: Negative.       Objective:   BP 109/68   Pulse 66   Temp 98.4 F (36.9 C) (Oral)   Wt 238 lb (108 kg)   SpO2 93%   BMI 48.90 kg/m   Wt Readings from Last 5 Encounters:  09/09/23 238 lb (108 kg)  05/09/23 240 lb 6.4 oz (109 kg)  12/24/22 237 lb 6.4 oz (107.7 kg)  09/23/22 239 lb 6.4 oz (108.6 kg)  06/21/22 239 lb 12.8 oz (108.8 kg)     Physical Exam Vitals and nursing note reviewed.  Constitutional:      General: She is not in acute distress.    Appearance: She is well-developed.  Cardiovascular:     Rate and Rhythm: Normal rate and regular rhythm.  Pulmonary:     Effort: Pulmonary effort is normal.     Breath sounds: Normal breath sounds.  Neurological:     Mental Status: She is alert and oriented to person, place, and time.       Assessment & Plan:   Type 2 diabetes mellitus with stage 3b chronic  kidney disease, without long-term current use of insulin  (HCC) -     POCT glycosylated hemoglobin (Hb A1C) -     AMB Referral [facility] Care Management     Return in about 3 months (around 12/10/2023).   Bascom GORMAN Borer, NP 09/09/2023

## 2023-09-11 ENCOUNTER — Other Ambulatory Visit: Payer: Self-pay | Admitting: Nurse Practitioner

## 2023-09-11 DIAGNOSIS — I159 Secondary hypertension, unspecified: Secondary | ICD-10-CM

## 2023-09-12 ENCOUNTER — Telehealth: Payer: Self-pay | Admitting: *Deleted

## 2023-09-12 NOTE — Progress Notes (Signed)
 Care Guide Pharmacy Note  09/12/2023 Name: ZAIAH CREDEUR MRN: [id number] DOB: 01/06/1961  Referred By: Oley Bascom RAMAN, NP Reason for referral: Complex Care Management (Initial outreach to schedule referral with PharmD [facility] )   MORAYMA GODOWN is a 63 y.o. year old female who is a primary care patient of Oley Bascom RAMAN, NP.  [name] was referred to the pharmacist for assistance related to: HTN, DMII, and CKD Stage G36/A1  An unsuccessful telephone outreach was attempted today to contact the patient who was referred to the pharmacy team for assistance with medication assistance. Additional attempts will be made to contact the patient.  [name]  [facility]  [facility], [facility] Guide  Direct Dial: [phone]  Fax [phone]

## 2023-09-13 NOTE — Progress Notes (Signed)
 Care Guide Pharmacy Note  09/13/2023 Name: [name] MRN: [id number] DOB: 10-08-1960  Referred By: Oley Bascom RAMAN, NP Reason for referral: Complex Care Management (Initial outreach to schedule referral with PharmD [facility] )   [name] is a 63 y.o. year old female who is a primary care patient of Oley Bascom RAMAN, NP.  [name] was referred to the pharmacist for assistance related to: HTN, DMII, and CKD Stage G36/A1   A second unsuccessful telephone outreach was attempted today to contact the patient who was referred to the pharmacy team for assistance with medication management. Additional attempts will be made to contact the patient.  [name]  [facility]  Value-Based Care Institute, [facility] Guide  Direct Dial: [phone]  Fax [phone]

## 2023-09-19 NOTE — Progress Notes (Signed)
 Care Guide Pharmacy Note  09/19/2023 Name: [name] MRN: [id number] DOB: 04/10/1960  Referred By: Oley Bascom RAMAN, NP Reason for referral: Complex Care Management (Initial outreach to schedule referral with PharmD [facility] )   [name] is a 63 y.o. year old female who is a primary care patient of Oley Bascom RAMAN, NP.  [name] was referred to the pharmacist for assistance related to: HTN, DMII, and CKD Stage G36/A1   An unsuccessful telephone outreach was attempted today to contact the patient who was referred to the pharmacy team for assistance with medication assistance. Additional attempts will be made to contact the patient.  [name]  [facility]  Value-Based Care Institute, [facility] Guide  Direct Dial: [phone]  Fax [phone]

## 2023-09-20 NOTE — Progress Notes (Signed)
 Care Guide Pharmacy Note  09/20/2023 Name: [name] MRN: [id number] DOB: 17-Jan-1961  Referred By: Oley Bascom RAMAN, NP Reason for referral: Complex Care Management (Initial outreach to schedule referral with PharmD [facility] )   [name] is a 63 y.o. year old female who is a primary care patient of Oley Bascom RAMAN, NP.  [name] was referred to the pharmacist for assistance related to: HTN, DMII, and CKD Stage G36/A1   A third unsuccessful telephone outreach was attempted today to contact the patient who was referred to the pharmacy team for assistance with medication management. The Population Health team is pleased to engage with this patient at any time in the future upon receipt of referral and should he/she be interested in assistance from the Population Health team.  [name]  [facility]  Value-Based Care Institute, [facility] Guide  Direct Dial: [phone]  Fax [phone]

## 2023-09-26 ENCOUNTER — Telehealth: Payer: Self-pay | Admitting: *Deleted

## 2023-09-26 ENCOUNTER — Telehealth: Payer: Self-pay

## 2023-09-26 NOTE — Progress Notes (Signed)
 Care Guide Pharmacy Note  09/26/2023 Name: AMAYRA KIEDROWSKI MRN: [id number] DOB: 1960-12-27  Referred By: Oley Bascom RAMAN, NP Reason for referral: Complex Care Management (Incoming call to schedule referral with PharmD )   LEIDY MASSAR is a 63 y.o. year old female who is a primary care patient of Oley Bascom RAMAN, NP.  [name] was referred to the pharmacist for assistance related to: COPD  Successful contact was made with the patient to discuss pharmacy services including being ready for the pharmacist to call at least 5 minutes before the scheduled appointment time and to have medication bottles and any blood pressure readings ready for review. The patient agreed to meet with the pharmacist via Face to Face on (date/time). 11/09/23 at 10:30 AM   Harlene [facility]  [facility], [facility] Guide  Direct Dial: [phone]  Fax [phone]

## 2023-09-27 NOTE — Progress Notes (Unsigned)
   09/27/2023  Patient ID: [name], female   DOB: [birth year], 63 y.o.   MRN: [id number]  Spoke with patient d/t potential cost issue with inhalers. Currently having to use expired medications, struggling with SOB from COPD. Short of breath with most activities. Opted to try Wixela inhaler given that is the only covered maintenance inhaler on her plan.   Previously spoke with plan and Wixela inhaler is preferred generic at $20.83/90 DS or $62.50/90 DS at retail or mail order. Any other brand name medication would be greater than $600/30 DS, including anoro, trelegy, incruse, spiriva , breztri, among others.   Provided patient with my direct line should she need anything or feels like breathing is not controlled with wixela. Nebulizers should be consider generic status so we could pursue triple therapy if needed that route.   Sees Layna for disease management October 1st. Will transition to medicare November 1st so PAPs might be a possibility to pursue at f/u.   Future Appointments  Date Time Provider Department Center  11/09/2023 10:30 AM [facility] PHARMACIST [facility] None  12/12/2023 10:40 AM Oley Bascom RAMAN, NP [facility] None  01/31/2024  3:00 PM Sherryl Bouchard, NP [facility] None   Lang Sieve, PharmD, BCGP Clinical Pharmacist  [phone]

## 2023-09-28 MED ORDER — FLUTICASONE-SALMETEROL 250-50 MCG/ACT IN AEPB: 1.0000 | INHALATION_SPRAY | Freq: Two times a day (BID) | RESPIRATORY_TRACT | 2 refills | Status: DC

## 2023-09-28 NOTE — Progress Notes (Signed)
 Patient informed that Rx for Wixela had been sent in for pharmacy.   Also mentioned at she is working with an Office manager to get her medicare set up come November 2025. I also provided her with contact information for medicare counselor, and encouraged her to work [employer] program through [employer].

## 2023-11-09 ENCOUNTER — Ambulatory Visit: Payer: Self-pay

## 2023-11-09 ENCOUNTER — Other Ambulatory Visit (HOSPITAL_COMMUNITY): Payer: Self-pay

## 2023-11-09 DIAGNOSIS — G8929 Other chronic pain: Secondary | ICD-10-CM

## 2023-11-09 DIAGNOSIS — M545 Low back pain, unspecified: Secondary | ICD-10-CM

## 2023-11-09 DIAGNOSIS — I5032 Chronic diastolic (congestive) heart failure: Secondary | ICD-10-CM

## 2023-11-09 DIAGNOSIS — I159 Secondary hypertension, unspecified: Secondary | ICD-10-CM | POA: Diagnosis not present

## 2023-11-09 DIAGNOSIS — J449 Chronic obstructive pulmonary disease, unspecified: Secondary | ICD-10-CM | POA: Diagnosis not present

## 2023-11-09 MED ORDER — GABAPENTIN 300 MG PO CAPS: 300.0000 mg | ORAL_CAPSULE | Freq: Every day | ORAL | 1 refills | Status: DC

## 2023-11-09 MED ORDER — ALBUTEROL SULFATE HFA 108 (90 BASE) MCG/ACT IN AERS: 2.0000 | INHALATION_SPRAY | Freq: Four times a day (QID) | RESPIRATORY_TRACT | 11 refills | Status: DC | PRN

## 2023-11-09 NOTE — Progress Notes (Signed)
 11/09/2023 Name: [name] MRN: [id number] DOB: Jun 15, 1960  Chief Complaint  Patient presents with   COPD    [name] is a 63 y.o. year old female who was referred for medication management by their primary care provider, Robin Bascom RAMAN, NP. They presented for a face to face visit today.   They were referred to the pharmacist by their PCP for assistance in managing medication access . PMH includes Afib, HTN, HFpEF (EF 60-65% in 2022), pulmonary HTN due to COPD, OSA, COPD, OSA on CPAP, GERD, hypothyroidism, HLD, T2DM, CKD3, depression.   Subjective: Patient was last seen by PCP, Bascom Oley, NP, on 09/09/23. At last visit, patient reported that she stopped Xarelto  due to cost. She reported starting herself on aspirin . Her A1C was 6.5%. She also reported ongoing ShOB but cannot afford inhalers. Urgent needs relayed to pharmacy team on 09/26/23 included inhaler access. [name], PharmD triaged this request while I was out of town, and determined with patient's insurance plan that [name] is the only preferred maintenance inhaler on her plan at cost of $20.83 per 90ds (all other alternatives > $600/30ds).   Today, patient presents in good spirits and presents without  any assistance. Patient is accompanied by her daughter [name]. They have enrolled her in a Medicare plan as she is about to turn 30, expect this to become active on 12/10/23. She plans to cancel her [place] high deductible plan before that date. She has supplies of her medications right now, except she has not been on Xarelto  since 2023 due to cost and has been taking aspirin  alone instead. She also reports that she is almost out of gabapentin  which she has been taking once capsule 300 mg nightly for sleep (originally prescribed TID for back pain). She reports that she previously followed with [facility] clinic, but has been lost to follow-up with them.   Care Team: Primary Care Provider: Oley Bascom RAMAN, NP ; Next Scheduled  Visit: 12/12/23   Medication Access/Adherence  Current Pharmacy:  [id number] [name], [place] - [address] [name] [address] Phone: [phone] Fax: [phone]  [id number] - [place] ([place]), [place] - [address] [place] ([place]) [place] [address] Phone: [phone] Fax: [phone]  [id number] - [name], [place] - [address] AT [facility] [address] [place] [address] Phone: [phone] Fax: [phone]   Patient reports affordability concerns with their medications: Yes  - patient lost her insurance and has been having difficulty with medication costs. She is currently enrolled in a high deductible plan with [employer] until her Medicare becomes active 12/10/23. Urgent needs relayed to pharmacy team on 09/26/23 included inhaler access. [name], PharmD triaged this request while I was out of town, and determined with patient's insurance plan that [name] is the only preferred maintenance inhaler on her plan at cost of $20.83 per 90ds (all other alternatives > $600/30ds). Patient was prescribed this inhaler and has since picked it up, but reports that she is still conserving doses due to access concerns.   Patient reports access/transportation concerns to their pharmacy: No   Patient reports adherence concerns with their medications:  Yes  - spacing out doses due to access concerns  Diabetes:  Current medications: metformin  XR 500 mg daily  Hyperlipidemia/ASCVD Risk Reduction  Current lipid lowering medications: rosuvastatin  10 mg daily Medications tried in the past:   Antiplatelet regimen: aspirin  81  mg daily (patient was previously treated with Xarelto  in 2023 for afib, but she discontinued this due to cost, and has only been taking aspirin  since then)  ASCVD History: none known, has dx of HFpEF Family History: CAD in mother, CAD in father,  Risk Factors:  T2DM, age, former smoker, HLD, HF  Heart Failure (EF 60-65%):  Current medications:  ACEi/ARB/ARNI: none SGLT2i: none Beta blocker: bisoprolol  5 mg daily (Afib) Mineralocorticoid Receptor Antagonist: none (appears she previously took spironolactone  12.5 mg daily - d/c'd in 2022 as patient needed f/u appt for further refills) Diuretic regimen: furosemide  40-80 mg daily for swelling and ShOB, metolazone  2.5 mg daily PRN (reports she takes this every 1-2 weeks) Potassium chloride  20 mEq BID with furosemide  40 mg daily and 20 mEq in the AM and 40 mg in the PM with furosemide  80 mg daily (reports she has not taken any potassium in 2-3 weeks)  Patient reports volume overload signs or symptoms including shortness of breath with exertion, lower extremity edema (appears euvolemic on exam today, but reports she does have swelling at home).  Atrial Fibrillation:  Current medications:   Rate Control: bisoprolol  5 mg daily Rhythm Control: none Anticoagulation Regimen: none (patient taking ASA 81 mg daily due to cost of DOAC)  Medications tried in the past: warfarin, Xarelto   COPD/Pulmonary HTN:  Current medications: Wixela (fluticasone -salmeterol) 250-50 mcg/act 1 puff BID, albuterol  HFA 2 puff Q6H PRN (has not filled recently) - Reports she is also using an old nebulizer at home, and she has an old Symbicort  in her purse.   Medications tried in the past: Trelegy (d/c'd due to cost on high deductible plan)  Objective:  BP Readings from Last 3 Encounters:  09/09/23 109/68  05/09/23 131/65  12/24/22 114/78    Lab Results  Component Value Date   HGBA1C 6.5 (A) 09/09/2023   HGBA1C 7.0 (A) 05/09/2023   HGBA1C 7.3 (H) 12/24/2022       Latest Ref Rng & Units 12/24/2022   10:14 AM 09/23/2022   10:25 AM 06/21/2022    9:44 AM  BMP  Glucose 65 - 99 mg/dL 829  871  851   BUN 7 - 25 mg/dL 35  16  21   Creatinine 0.50 - 1.05 mg/dL 8.37  8.77  8.63   BUN/Creat Ratio 6 - 22 (calc) 22  13  15     Sodium 135 - 146 mmol/L 139  142  143   Potassium 3.5 - 5.3 mmol/L 2.9  4.0  3.0   Chloride 98 - 110 mmol/L 88  102  97   CO2 20 - 32 mmol/L 36  31  37   Calcium  8.6 - 10.4 mg/dL 89.7  9.7  9.3     Lab Results  Component Value Date   CHOL 230 (H) 12/24/2022   HDL 47 (L) 12/24/2022   LDLCALC 151 (H) 12/24/2022   TRIG 180 (H) 12/24/2022   CHOLHDL 4.9 12/24/2022    Medications Reviewed Today     Reviewed by Robin Lorain SQUIBB, RPH (Pharmacist) on 11/09/23 at 1103  Med List Status: <None>   Medication Order Taking? Sig Documenting Provider Last Dose Status Informant  albuterol  (VENTOLIN  HFA) 108 (90 Base) MCG/ACT inhaler [id number] Yes TAKE 2 PUFFS BY MOUTH EVERY 6 HOURS AS NEEDED FOR WHEEZE OR SHORTNESS OF SHERIDA Cowden, Rosina, NP  Active   aspirin  EC (SB LOW DOSE ASA EC) 81 MG tablet [id number] Yes Take 81 mg by mouth daily. Swallow whole. [provider]  Active   aspirin -acetaminophen -caffeine (EXCEDRIN MIGRAINE) 250-250-65 MG per tablet [id number] Yes Take 1 tablet by mouth every 6 (six) hours as needed for headache. [provider]  Active Self  bisoprolol  (ZEBETA ) 5 MG tablet [id number] Yes TAKE 1 TABLET BY MOUTH DAILY FOR BLOOD PRESSURE (DX: I.10) Robin Bascom RAMAN, NP  Active   Calcium  Citrate-Vitamin D  (CALCIUM  + D PO) [id number]  Take by mouth. [provider]  Active   Cyanocobalamin (B-12 PO) [id number]  Take by mouth. [provider]  Active   diltiazem  (CARDIZEM  CD) 360 MG 24 hr capsule [id number] Yes TAKE 1 CAPSULE BY MOUTH DAILY FOR BLOOD PRESSURE & HEART RHYTHM Nichols, Tonya S, NP  Active   diphenhydrAMINE HCl, Sleep, 50 MG CAPS [id number]  Take 1 capsule by mouth daily. [provider]  Active Self  DULoxetine  (CYMBALTA ) 60 MG capsule [id number] Yes TAKE 1 CAPSULE BY MOUTH EVERY DAY Webb, Padonda B, FNP  Active   fluticasone -salmeterol (WIXELA INHUB) 250-50 MCG/ACT AEPB [id number] Yes Inhale 1 puff into the lungs in the morning and at  bedtime. Rinse and spit with water after use. Robin Bascom RAMAN, NP  Active     Discontinued 11/09/23 1059 (Cost of medication)   furosemide  (LASIX ) 40 MG tablet [id number] Yes Take 1 tablet   2  to 3  X /day  as Directed for BP & Fluid Retention / Ankle Swelling Robin Bascom RAMAN, NP  Active   gabapentin  (NEURONTIN ) 300 MG capsule [id number] Yes Take 1 capsule 3 x /day for Chronic Back Pain  Patient taking differently: Take 1 capsule 3 x /day for Chronic Back Pain   Robin Bascom RAMAN, NP  Active   ipratropium-albuterol  (DUONEB) 0.5-2.5 (3) MG/3ML SOLN [id number] Yes USE 1 NEBULE VIA NEBULIZATION EVERY 4 HOURS AS NEEDED (MAX 6 DOSES PER DAY) Wilkinson, Dana E, NP  Active   metFORMIN  (GLUCOPHAGE -XR) 500 MG 24 hr tablet [id number] Yes TAKE 1 TABLET BY MOUTH EVERY DAY WITH BREAKFAST Nichols, Tonya S, NP  Active   metolazone  (ZAROXOLYN ) 2.5 MG tablet [id number] Yes TAKE 1 TABLET (2.5 MG TOTAL) BY MOUTH AS NEEDED. Robin Bascom RAMAN, NP  Active   montelukast  (SINGULAIR ) 10 MG tablet [id number] Yes TAKE 1 TABLET BY MOUTH EVERYDAY AT BEDTIME Nichols, Tonya S, NP  Active   potassium chloride  (KLOR-CON ) 10 MEQ tablet [id number] Yes TAKE 2 TABLETS 2 X /DAY FOR POTASSIUM & TAKE ADDITIONAL 2 TABLETS WITH ADDITIONAL DOSE OF LASIX  Nichols, Tonya S, NP  Active   rosuvastatin  (CRESTOR ) 10 MG tablet [id number] Yes TAKE 1 TABLET BY MOUTH EVERY DAY Wilkinson, Dana E, NP  Active   traZODone  (DESYREL ) 100 MG tablet [id number] Yes TAKE 1 TABLET BY MOUTH 1 HOUR BEFORE BEDTIME AS NEEDED FOR SLEEP Robin Bascom RAMAN, NP  Active               Assessment/Plan:   Diabetes: - Currently controlled with most recent A1C of 6.5% below goal <7% on metformin  XR 500 mg daily. Once new insurance starts would prefer adding or switching to SGLT2i for HF and CKD benefit and to reduce s/sx of fluid overload, frequency of diuretic dosing, and need for potassium supplementation. Obtained repeat BMP today to assess appropriateness of continuing  metformin , with last eGFR ~35 mL/min.  - Last UACR March 2025 - 15 mg/g - Reviewed long term cardiovascular and renal outcomes of uncontrolled blood sugar - Recommend to continue metformin  XR 500 mg daily  - Next A1C due 12/10/23  Hyperlipidemia/ASCVD Risk Reduction: - Currently uncontrolled with most recent LDL-C of 151 mg/dL above goal < 70 mg/dL given U7IF + co morbidities. Statin fill hx is appropriate. Will consider increasing to high intensity statin at follow-up.  - Reviewed long term complications of uncontrolled cholesterole - Recommend to continue rosuvastatin  10 mg daily - Obtain repeat fasting lipid panel at PCP f/u - 12/12/23   Heart Failure and Pulmonary HTN due to COPD: - Currently inappropriately managed/opportunity for optimization as patient has been lost to follow up with Adv HF clinic. Encouraged her to schedule appt today. Obtained BMP as last labs are from Nov 2024 and potassium was 2.9 mEq/L. Anticipate she may have low potassium today, as she has not been taking her supplementation regularly. As discussed above, will look into coverage of SGLT2i after 12/10/23 with goal of reducing TDD of furosemide . Patient may also benefit from restarting MRA.  - Recommend to continue current regimen: Current medications:  ACEi/ARB/ARNI: none SGLT2i: none Beta blocker: bisoprolol  5 mg daily (Afib) Mineralocorticoid Receptor Antagonist: none  Diuretic regimen: furosemide  40-80 mg daily for swelling and ShOB, metolazone  2.5 mg daily PRN  Potassium chloride  20 mEq BID with furosemide  40 mg daily and 20 mEq in the AM and 40 mg in the PM with furosemide  80 mg daily - Obtained BMP today and will follow up results for further management   Atrial Fibrillation: - Currently inappropriately managed due to access issues with DOAC. Both Eliquis and Xarelto  are unaffordable on her high deductible plan. She has not taken Xarelto  since 2023. Discussed importance of adherence for stroke  prevention. Will attempt to restart after new insurance activates on 12/10/23. Will determine whether patient is eligible for free trial card in the mean time.  - Reviewed importance of adherence to anticoagulant for stroke prevention.  COPD: - Currently uncontrolled. Encouraged patient to take Wixela as prescribed since she reports the copay of ~$20/mo has been affordable for her. Will refill albuterol  inhaler today. Will attempt to restart triple therapy (Breztri or Trelegy) once she is approved for insurance. Will investigate PAP programs ([employer] for [place]), if Medicare copays are unaffordable. - Educated on maintenance vs rescue inhaler and inhaler technique. - Recommend to continue Wixela (fluticasone -salmeterol) 250-50 mcg/act 1 puff BID, albuterol  HFA 2 puff Q6H PRN    Noted that patient has multiple medication management needs. Mainly focused on access and adherence today. Will follow-up after Medicare plan becomes active to address medication changes, as it is currently unclear what will be cost-effective for her moving forward. Collaborated with PCP to refill gabapentin  as requested today. Screened patient for Medicare LIS today and she is above the income limit.   Written patient instructions provided. Patient verbalized understanding of treatment plan.    Follow Up Plan:  Pharmacist 12/13/23 PCP clinic visit 12/12/23   Lorain Baseman, PharmD [facility] [phone]

## 2023-11-09 NOTE — Patient Instructions (Signed)
 It was nice to see you today!  Medication Changes: START taking Wixela 1 puff every morning and evening. Rinse and spit with water after use  Pick up a new albuterol  inhaler (should be around $18) and use 2 puffs every 6 hours as needed for wheezing or shortness of breath  Continue all other medication the same.   Once your Medicare picks up on Nov 1, we will look into how to get you back on a blood thinner (Xarelto  or Eliquis) to prevent strokes.  Monitor blood sugars at home and keep a log (glucometer or piece of paper) to bring with you to your next visit.  Keep up the good work with diet and exercise. Aim for a diet full of vegetables, fruit and lean meats (chicken, [place], fish). Try to limit salt intake by eating fresh or frozen vegetables (instead of canned), rinse canned vegetables prior to cooking and do not add any additional salt to meals.   Please call if you have any questions or concerns before then.  Lorain Baseman, PharmD [facility] [phone]

## 2023-11-10 ENCOUNTER — Ambulatory Visit: Payer: Self-pay | Admitting: Nurse Practitioner

## 2023-11-10 DIAGNOSIS — N289 Disorder of kidney and ureter, unspecified: Secondary | ICD-10-CM

## 2023-11-10 LAB — BMP8
BUN: 23 mg/dL (ref 8–27)
CO2: 27 mmol/L (ref 20–29)
Calcium: 10.1 mg/dL (ref 8.7–10.3)
Chloride: 96 mmol/L (ref 96–106)
Creatinine, Ser: 1.8 mg/dL — ABNORMAL HIGH (ref 0.57–1.00)
Glucose: 137 mg/dL — ABNORMAL HIGH (ref 70–99)
Potassium: 3.2 mmol/L — ABNORMAL LOW (ref 3.5–5.2)
Sodium: 144 mmol/L (ref 134–144)
eGFR: 31 mL/min/1.73 — ABNORMAL LOW (ref >59)

## 2023-11-13 ENCOUNTER — Other Ambulatory Visit: Payer: Self-pay | Admitting: Nurse Practitioner

## 2023-11-13 DIAGNOSIS — I159 Secondary hypertension, unspecified: Secondary | ICD-10-CM

## 2023-11-13 DIAGNOSIS — N1832 Chronic kidney disease, stage 3b: Secondary | ICD-10-CM

## 2023-11-17 ENCOUNTER — Other Ambulatory Visit: Payer: Self-pay | Admitting: Nurse Practitioner

## 2023-11-17 DIAGNOSIS — Z79899 Other long term (current) drug therapy: Secondary | ICD-10-CM

## 2023-11-17 DIAGNOSIS — I5032 Chronic diastolic (congestive) heart failure: Secondary | ICD-10-CM

## 2023-12-12 ENCOUNTER — Ambulatory Visit: Payer: Self-pay | Admitting: Nurse Practitioner

## 2023-12-12 ENCOUNTER — Encounter: Payer: Self-pay | Admitting: Nurse Practitioner

## 2023-12-12 VITALS — BP 102/62 | HR 81 | Wt 233.4 lb

## 2023-12-12 DIAGNOSIS — N1832 Chronic kidney disease, stage 3b: Secondary | ICD-10-CM

## 2023-12-12 DIAGNOSIS — Z1329 Encounter for screening for other suspected endocrine disorder: Secondary | ICD-10-CM | POA: Diagnosis not present

## 2023-12-12 DIAGNOSIS — Z13220 Encounter for screening for lipoid disorders: Secondary | ICD-10-CM | POA: Diagnosis not present

## 2023-12-12 DIAGNOSIS — E1122 Type 2 diabetes mellitus with diabetic chronic kidney disease: Secondary | ICD-10-CM | POA: Diagnosis not present

## 2023-12-12 MED ORDER — METFORMIN HCL ER 500 MG PO TB24: 500.0000 mg | ORAL_TABLET | Freq: Two times a day (BID) | ORAL | 2 refills | Status: DC

## 2023-12-12 NOTE — Progress Notes (Signed)
 Subjective   Patient ID: [name], female    DOB: 1960/06/11, 63 y.o.   MRN: [id number]  Chief Complaint  Patient presents with   Diabetes    Referring provider: Oley Bascom RAMAN, NP  [name] is a 63 y.o. female with Past Medical History: No date: Allergy No date: Anemia No date: Anxiety No date: Atrial flutter (HCC) No date: Borderline diabetes No date: Cancer (HCC) No date: Cataract No date: CHF (congestive heart failure) (HCC) No date: Chronic diastolic heart failure (HCC) No date: Chronic kidney disease No date: COPD (chronic obstructive pulmonary disease) (HCC) No date: Depression No date: GERD (gastroesophageal reflux disease) No date: HTN (hypertension) No date: Hyperlipidemia No date: Obstructive sleep apnea No date: Oxygen  deficiency No date: Persistent atrial fibrillation (HCC)     Comment:  Myoview 4/09: No ischemia;  echo 4/09: EF 60-65%;                 Flecainide , beta blocker, Coumadin ;    Unable to take               Tikosyn 2/2 prolonged QT No date: Pulmonary embolism (HCC)     Comment:  Right lower lobe diagnosed by CT 6/12 No date: Sleep apnea No date: Type II or unspecified type diabetes mellitus without  mention of complication, not stated as uncontrolled   HPI  Patient presents today to establish care.  She was a former patient of Dr. Tonita.  Her recent labs and medication list are updated in the system.  Patient states that she does have a history of chronic A-fib but stopped taking Xarelto  due to cost.  She did stop following up with cardiology as well.  She placed herself on aspirin .  We have placed a referral for her to follow back up with cardiology for this issue.  Patient's A1c in office today is 7.1.  She has not been on any diabetic medications.  We will started metformin  at last visit.  Denies f/c/s, n/v/d, hemoptysis, PND, leg swelling.     Allergies  Allergen Reactions   Sulfonamide Derivatives Hives and Itching    Tikosyn [Dofetilide]     Prolonged qt    Immunization History  Administered Date(s) Administered   Influenza Inj Mdck Quad With Preservative 11/30/2018, 12/03/2019   Influenza,inj,Quad PF,6+ Mos 11/27/2021   PFIZER(Purple Top)SARS-COV-2 Vaccination 12/02/2019, 12/25/2019   PNEUMOCOCCAL CONJUGATE-20 01/15/2021   Td 02/08/2001   Tdap 11/07/2017    Tobacco History: Social History   Tobacco Use  Smoking Status Former   Current packs/day: 0.00   Average packs/day: 2.2 packs/day for 46.7 years (105.0 ttl pk-yrs)   Types: Cigarettes   Start date: 09/09/1974   Quit date: 09/08/2009   Years since quitting: 14.2  Smokeless Tobacco Never   Counseling given: Not Answered   Outpatient Encounter Medications as of 12/12/2023  Medication Sig   albuterol  (VENTOLIN  HFA) 108 (90 Base) MCG/ACT inhaler Inhale 2 puffs into the lungs every 6 (six) hours as needed for wheezing or shortness of breath.   aspirin  EC (SB LOW DOSE ASA EC) 81 MG tablet Take 81 mg by mouth daily. Swallow whole.   aspirin -acetaminophen -caffeine (EXCEDRIN MIGRAINE) 250-250-65 MG per tablet Take 1 tablet by mouth every 6 (six) hours as needed for headache.   bisoprolol  (ZEBETA ) 5 MG tablet TAKE 1 TABLET BY MOUTH DAILY FOR BLOOD PRESSURE (DX: I.10)   Calcium  Citrate-Vitamin D  (CALCIUM  + D PO) Take by mouth.   Cyanocobalamin (B-12 PO)  Take by mouth.   diltiazem  (CARDIZEM  CD) 360 MG 24 hr capsule TAKE 1 CAPSULE BY MOUTH DAILY FOR BLOOD PRESSURE & HEART RHYTHM   diphenhydrAMINE HCl, Sleep, 50 MG CAPS Take 1 capsule by mouth daily.   DULoxetine  (CYMBALTA ) 60 MG capsule TAKE 1 CAPSULE BY MOUTH EVERY DAY   fluticasone -salmeterol (WIXELA INHUB) 250-50 MCG/ACT AEPB Inhale 1 puff into the lungs in the morning and at bedtime. Rinse and spit with water after use.   furosemide  (LASIX ) 40 MG tablet TAKE 1 TABLET 2 TO 3 X /DAY AS DIRECTED FOR BP & FLUID RETENTION / ANKLE SWELLING   gabapentin  (NEURONTIN ) 300 MG capsule Take 1 capsule (300 mg  total) by mouth at bedtime.   ipratropium-albuterol  (DUONEB) 0.5-2.5 (3) MG/3ML SOLN USE 1 NEBULE VIA NEBULIZATION EVERY 4 HOURS AS NEEDED (MAX 6 DOSES PER DAY)   metFORMIN  (GLUCOPHAGE -XR) 500 MG 24 hr tablet TAKE 1 TABLET BY MOUTH EVERY DAY WITH BREAKFAST   metolazone  (ZAROXOLYN ) 2.5 MG tablet TAKE 1 TABLET (2.5 MG TOTAL) BY MOUTH AS NEEDED.   montelukast  (SINGULAIR ) 10 MG tablet TAKE 1 TABLET BY MOUTH EVERYDAY AT BEDTIME   potassium chloride  (KLOR-CON ) 10 MEQ tablet TAKE 2 TABLETS 2 X /DAY FOR POTASSIUM & TAKE ADDITIONAL 2 TABLETS WITH ADDITIONAL DOSE OF LASIX    rosuvastatin  (CRESTOR ) 10 MG tablet TAKE 1 TABLET BY MOUTH EVERY DAY   traZODone  (DESYREL ) 100 MG tablet TAKE 1 TABLET BY MOUTH 1 HOUR BEFORE BEDTIME AS NEEDED FOR SLEEP   No facility-administered encounter medications on file as of 12/12/2023.    Review of Systems  Review of Systems  Constitutional: Negative.   HENT: Negative.    Cardiovascular: Negative.   Gastrointestinal: Negative.   Allergic/Immunologic: Negative.   Neurological: Negative.   Psychiatric/Behavioral: Negative.       Objective:   BP 102/62 (BP Location: Left Arm, Patient Position: Sitting, Cuff Size: Large)   Pulse 81   Wt 233 lb 6.4 oz (105.9 kg)   SpO2 91%   BMI 47.95 kg/m   Wt Readings from Last 5 Encounters:  12/12/23 233 lb 6.4 oz (105.9 kg)  09/09/23 238 lb (108 kg)  05/09/23 240 lb 6.4 oz (109 kg)  12/24/22 237 lb 6.4 oz (107.7 kg)  09/23/22 239 lb 6.4 oz (108.6 kg)     Physical Exam Vitals and nursing note reviewed.  Constitutional:      General: She is not in acute distress.    Appearance: She is well-developed.  Cardiovascular:     Rate and Rhythm: Normal rate and regular rhythm.  Pulmonary:     Effort: Pulmonary effort is normal.     Breath sounds: Normal breath sounds.  Neurological:     Mental Status: She is alert and oriented to person, place, and time.       Assessment & Plan:   Type 2 diabetes mellitus with stage  3b chronic kidney disease, without long-term current use of insulin  (HCC) -     CBC -     Comprehensive metabolic panel with GFR  Thyroid  disorder screen -     TSH  Lipid screening -     Lipid panel     Return in about 3 months (around 03/13/2024).     Bascom GORMAN Borer, NP 12/12/2023

## 2023-12-13 ENCOUNTER — Other Ambulatory Visit (HOSPITAL_COMMUNITY): Payer: Self-pay

## 2023-12-13 ENCOUNTER — Other Ambulatory Visit (INDEPENDENT_AMBULATORY_CARE_PROVIDER_SITE_OTHER): Payer: Self-pay

## 2023-12-13 DIAGNOSIS — I5032 Chronic diastolic (congestive) heart failure: Secondary | ICD-10-CM

## 2023-12-13 DIAGNOSIS — I4821 Permanent atrial fibrillation: Secondary | ICD-10-CM

## 2023-12-13 LAB — COMPREHENSIVE METABOLIC PANEL WITH GFR
ALT: 18 IU/L (ref 0–32)
AST: 15 IU/L (ref 0–40)
Albumin: 4.4 g/dL (ref 3.9–4.9)
Alkaline Phosphatase: 118 IU/L (ref 49–135)
BUN/Creatinine Ratio: 12 (ref 12–28)
BUN: 21 mg/dL (ref 8–27)
Bilirubin Total: 0.4 mg/dL (ref 0.0–1.2)
CO2: 27 mmol/L (ref 20–29)
Calcium: 9.4 mg/dL (ref 8.7–10.3)
Chloride: 98 mmol/L (ref 96–106)
Creatinine, Ser: 1.76 mg/dL — ABNORMAL HIGH (ref 0.57–1.00)
Globulin, Total: 2.7 g/dL (ref 1.5–4.5)
Glucose: 126 mg/dL — ABNORMAL HIGH (ref 70–99)
Potassium: 3.6 mmol/L (ref 3.5–5.2)
Sodium: 141 mmol/L (ref 134–144)
Total Protein: 7.1 g/dL (ref 6.0–8.5)
eGFR: 32 mL/min/1.73 — ABNORMAL LOW (ref >59)

## 2023-12-13 LAB — CBC
Hematocrit: 44.2 % (ref 34.0–46.6)
Hemoglobin: 14.2 g/dL (ref 11.1–15.9)
MCH: 28.6 pg (ref 26.6–33.0)
MCHC: 32.1 g/dL (ref 31.5–35.7)
MCV: 89 fL (ref 79–97)
Platelets: 222 x10E3/uL (ref 150–450)
RBC: 4.97 x10E6/uL (ref 3.77–5.28)
RDW: 13.4 % (ref 11.7–15.4)
WBC: 8.2 x10E3/uL (ref 3.4–10.8)

## 2023-12-13 LAB — LIPID PANEL
Chol/HDL Ratio: 2.4 ratio (ref 0.0–4.4)
Cholesterol, Total: 119 mg/dL (ref 100–199)
HDL: 50 mg/dL (ref >39)
LDL Chol Calc (NIH): 51 mg/dL (ref 0–99)
Triglycerides: 92 mg/dL (ref 0–149)
VLDL Cholesterol Cal: 18 mg/dL (ref 5–40)

## 2023-12-13 LAB — TSH: TSH: 3.69 u[IU]/mL (ref 0.450–4.500)

## 2023-12-13 NOTE — Progress Notes (Signed)
 12/13/2023 Name: [name] MRN: [id number] DOB: [birth year]  Chief Complaint  Patient presents with   COPD   Congestive Heart Failure    [name] is a 63 y.o. year old female who was referred for medication management by their primary care provider, Oley Bascom RAMAN, NP. They presented for a telephone visit today.   They were referred to the pharmacist by their PCP for assistance in managing medication access . PMH includes Afib, HTN, HFpEF (EF 60-65% in 2022), pulmonary HTN due to COPD, OSA, COPD, OSA on CPAP, GERD, hypothyroidism, HLD, T2DM, CKD3, depression.   Subjective: Patient was last seen by PCP, Bascom Oley, NP, on 09/09/23. At last visit, patient reported that she stopped Xarelto  due to cost. She reported starting herself on aspirin . Her A1C was 6.5%. She also reported ongoing ShOB but cannot afford inhalers. Urgent needs relayed to pharmacy team on 09/26/23 included inhaler access. Lang Sieve, PharmD triaged this request while I was out of town, and determined with patient's insurance plan that Napoleon is the only preferred maintenance inhaler on her plan at cost of $20.83 per 90ds (all other alternatives > $600/30ds). At pharmacy appt on 11/09/23, patient reported Medicare would become active 12/10/23. She reported having medications except Xarelto , which had not been filled since 2023 due to cost. She has been taking aspirin  alone instead. We checked a BMP which demonstrated hypokalemia. She was instructed to resume potassium with furosemide . She was also instructed to follow-up with Adv HF.   Today, patient reported that Medicare Part B became active 12/10/23, but part D plan does not become active until 01/09/24. She reports she has been taking her potassium with furosemide . Has not scheduled with Adv HF clinic yet.   Care Team: Primary Care Provider: Oley Bascom RAMAN, NP ; Next Scheduled Visit: 03/14/24   Medication Access/Adherence  Current Pharmacy:  [id number] [place], [place] - [address] [place] [address] Phone: [phone] Fax: [phone]  [id number] - [place] - [address] [place] ([place]) [place] [address] Phone: [phone] Fax: [phone]  [id number] [place], [place] - [address] AT [facility] [address] [place] [address] Phone: [phone] Fax: [phone]   Patient reports affordability concerns with their medications: Yes  - patient had lost her insurance and had difficulty with medication costs. She enrolled with a high deductible plan with [employer], but this is now cancelled as she thought her Medicare Part D went into effect on 12/10/23, but it is actually 01/09/24. She does have Medicare part B now. Screened for Medicare LIS in the past and she was above the income limit.  - She was started on Wixela for COPD, as this was the only inhaler affordable on her [employer] plan (cost of $20.83 per 90ds (all other alternatives > $600/30ds)  Patient reports access/transportation concerns to their pharmacy: No   Patient reports adherence concerns with their medications:  Yes  - spacing out doses of Wixela due to access concerns. Thinks she will be able to purchase her heart/BP medications for cash price while she does not have drug coverage for one month.   Diabetes:  Current medications: metformin  XR 500 mg daily  Hyperlipidemia/ASCVD Risk Reduction  Current lipid lowering medications: rosuvastatin  10 mg daily Medications tried in the past:   Antiplatelet regimen: aspirin  81 mg daily (patient was previously treated with Xarelto  in 2023 for  afib, but she discontinued this due to cost, and has only been taking aspirin  since then)  ASCVD History: none known, has dx of HFpEF Family History: CAD in mother, CAD in father,  Risk Factors: T2DM, age, former smoker, HLD, HF  Heart Failure (EF  60-65%):  Current medications:  ACEi/ARB/ARNI: none SGLT2i: none Beta blocker: bisoprolol  5 mg daily (Afib) Mineralocorticoid Receptor Antagonist: none (appears she previously took spironolactone  12.5 mg daily - d/c'd in 2022 as patient needed f/u appt for further refills) Diuretic regimen: furosemide  40-80 mg daily for swelling and ShOB, metolazone  2.5 mg daily PRN (reports she takes this every 1-2 weeks) Potassium chloride  20 mEq BID with furosemide  40 mg daily and 20 mEq in the AM and 40 mg in the PM with furosemide  80 mg daily (reports she has resumed potassium)  Patient reports volume overload signs or symptoms including shortness of breath with exertion, lower extremity edema (appears euvolemic on exam today, but reports she does have swelling at home).  Atrial Fibrillation:  Current medications:   Rate Control: bisoprolol  5 mg daily Rhythm Control: none Anticoagulation Regimen: none (patient taking ASA 81 mg daily due to cost of DOAC)  Medications tried in the past: warfarin, Xarelto    This patients CHA2DS2-VASc Score and unadjusted Ischemic Stroke Rate (% per year) is equal to 4.8 % stroke rate/year from a score of 4  COPD/Pulmonary HTN:  Current medications: Wixela (fluticasone -salmeterol) 250-50 mcg/act 1 puff BID (not taking daily as she wants to conserve doses), albuterol  HFA 2 puff Q6H PRN (has not filled recently) - Reports she is also using an old nebulizer at home, and she has an old Symbicort  in her purse.   Medications tried in the past: Trelegy (d/c'd due to cost on high deductible plan)  Objective:  BP Readings from Last 3 Encounters:  12/12/23 102/62  09/09/23 109/68  05/09/23 131/65    Lab Results  Component Value Date   HGBA1C 6.5 (A) 09/09/2023   HGBA1C 7.0 (A) 05/09/2023   HGBA1C 7.3 (H) 12/24/2022       Latest Ref Rng & Units 12/12/2023   11:35 AM 11/09/2023   11:19 AM 12/24/2022   10:14 AM  BMP  Glucose 70 - 99 mg/dL 873  862  829   BUN 8 -  27 mg/dL 21  23  35   Creatinine 0.57 - 1.00 mg/dL 8.23  8.19  8.37   BUN/Creat Ratio 12 - 28 12   22    Sodium 134 - 144 mmol/L 141  144  139   Potassium 3.5 - 5.2 mmol/L 3.6  3.2  2.9   Chloride 96 - 106 mmol/L 98  96  88   CO2 20 - 29 mmol/L 27  27  36   Calcium  8.7 - 10.3 mg/dL 9.4  89.8  89.7     Lab Results  Component Value Date   CHOL 119 12/12/2023   HDL 50 12/12/2023   LDLCALC 51 12/12/2023   TRIG 92 12/12/2023   CHOLHDL 2.4 12/12/2023    Medications Reviewed Today     Reviewed by Brinda Lorain SQUIBB, RPH (Pharmacist) on 12/13/23 at 1541  Med List Status: <None>   Medication Order Taking? Sig Documenting Provider Last Dose Status Informant  albuterol  (VENTOLIN  HFA) 108 (90 Base) MCG/ACT inhaler [id number]  Inhale 2 puffs into the lungs every 6 (six) hours as needed for wheezing or shortness of breath. Oley Bascom RAMAN, NP  Active   aspirin  EC (SB LOW DOSE ASA  EC) 81 MG tablet [id number] Yes Take 81 mg by mouth daily. Swallow whole. [provider]  Active   aspirin -acetaminophen -caffeine (EXCEDRIN MIGRAINE) 250-250-65 MG per tablet [id number]  Take 1 tablet by mouth every 6 (six) hours as needed for headache. [provider]  Active Self  bisoprolol  (ZEBETA ) 5 MG tablet [id number] Yes TAKE 1 TABLET BY MOUTH DAILY FOR BLOOD PRESSURE (DX: I.10) Oley Bascom RAMAN, NP  Active   Calcium  Citrate-Vitamin D  (CALCIUM  + D PO) [id number]  Take by mouth. [provider]  Active   Cyanocobalamin (B-12 PO) [id number]  Take by mouth. [provider]  Active   diltiazem  (CARDIZEM  CD) 360 MG 24 hr capsule [id number] Yes TAKE 1 CAPSULE BY MOUTH DAILY FOR BLOOD PRESSURE & HEART RHYTHM Nichols, Tonya S, NP  Active   diphenhydrAMINE HCl, Sleep, 50 MG CAPS [id number]  Take 1 capsule by mouth daily. [provider]  Active Self  DULoxetine  (CYMBALTA ) 60 MG capsule [id number] Yes TAKE 1 CAPSULE BY MOUTH EVERY DAY Webb, Padonda B, FNP  Active   fluticasone -salmeterol  (WIXELA INHUB) 250-50 MCG/ACT AEPB [id number] Yes Inhale 1 puff into the lungs in the morning and at bedtime. Rinse and spit with water after use. Oley Bascom RAMAN, NP  Active   furosemide  (LASIX ) 40 MG tablet [id number] Yes TAKE 1 TABLET 2 TO 3 X /DAY AS DIRECTED FOR BP & FLUID RETENTION / ANKLE SWELLING Oley Bascom RAMAN, NP  Active   gabapentin  (NEURONTIN ) 300 MG capsule [id number] Yes Take 1 capsule (300 mg total) by mouth at bedtime. Oley Bascom RAMAN, NP  Active   ipratropium-albuterol  (DUONEB) 0.5-2.5 (3) MG/3ML SOLN [id number]  USE 1 NEBULE VIA NEBULIZATION EVERY 4 HOURS AS NEEDED (MAX 6 DOSES PER DAY) Wilkinson, Dana E, NP  Active   metFORMIN  (GLUCOPHAGE -XR) 500 MG 24 hr tablet [id number] Yes Take 1 tablet (500 mg total) by mouth 2 (two) times daily with a meal. Oley Bascom RAMAN, NP  Active   metolazone  (ZAROXOLYN ) 2.5 MG tablet [id number]  TAKE 1 TABLET (2.5 MG TOTAL) BY MOUTH AS NEEDED. Oley Bascom RAMAN, NP  Active   montelukast  (SINGULAIR ) 10 MG tablet [id number] Yes TAKE 1 TABLET BY MOUTH EVERYDAY AT BEDTIME Nichols, Tonya S, NP  Active   potassium chloride  (KLOR-CON ) 10 MEQ tablet [id number] Yes TAKE 2 TABLETS 2 X /DAY FOR POTASSIUM & TAKE ADDITIONAL 2 TABLETS WITH ADDITIONAL DOSE OF LASIX  Nichols, Tonya S, NP  Active   rosuvastatin  (CRESTOR ) 10 MG tablet [id number] Yes TAKE 1 TABLET BY MOUTH EVERY DAY Wilkinson, Dana E, NP  Active   traZODone  (DESYREL ) 100 MG tablet [id number] Yes TAKE 1 TABLET BY MOUTH 1 HOUR BEFORE BEDTIME AS NEEDED FOR SLEEP Oley Bascom RAMAN, NP  Active               Assessment/Plan:   Diabetes: - Currently controlled with most recent A1C of 6.5% below goal <7% on metformin  XR 500 mg daily. Once new insurance starts would prefer adding or switching to SGLT2i for HF and CKD benefit and to reduce s/sx of fluid overload, frequency of diuretic dosing, and need for potassium supplementation. eGFR is very close to cut-off for metformin  but still > 30 mL/min. Once patient has  access to alternative therapy, will discontinue metformin .  - Last UACR March 2025 - 15 mg/g - Reviewed long term cardiovascular and renal outcomes of uncontrolled blood sugar - Recommend to continue metformin  XR 500 mg daily  - Next A1C due now  Hyperlipidemia/ASCVD Risk Reduction: - Currently controlled with most recent LDL-C of 51 mg/dL below goal < 70 mg/dL given U7IF + co morbidities. Statin fill hx is appropriate.  - Reviewed long term complications of uncontrolled cholesterole - Recommend to continue rosuvastatin  10 mg daily   Heart Failure and Pulmonary HTN due to COPD: - Currently inappropriately managed/opportunity for optimization as patient has been lost to follow up with Adv HF clinic. Again, encouraged her to schedule appt, especially now that she has Medicare Part B. Labs are stable, but she would likely benefit from addition of SGLT2i and MRA to reduce need for diuretic/potassium.  - Recommend to continue current regimen: Current medications:  ACEi/ARB/ARNI: none SGLT2i: none Beta blocker: bisoprolol  5 mg daily (Afib) Mineralocorticoid Receptor Antagonist: none  Diuretic regimen: furosemide  40-80 mg daily for swelling and ShOB, metolazone  2.5 mg daily PRN  Potassium chloride  20 mEq BID with furosemide  40 mg daily and 20 mEq in the AM and 40 mg in the PM with furosemide  80 mg daily - Preemptively enrolled in [employer] fund today, since it was open. Will need to wait until she has an active Part D plan to use for brand name medications.    Atrial Fibrillation: - Currently inappropriately managed due to access issues with DOAC. Since patient is now one month from having an active Part D plan, will try to use 1 mo free coupon card for her to resume Eliquis for stroke prevention, then plan to use the [employer] grant moving forward. - Reviewed importance of adherence to anticoagulant for stroke prevention. Plan to restart Eliquis at 5 mg BID and  stop aspirin . Will have to call patient back to discuss this plan, as I confirmed eligibility for 1 mo free trial card after the call ended.    COPD: - Currently uncontrolled. Encouraged patient to take Wixela as prescribed and albuterol  as needed. Will attempt to restart triple therapy (Breztri or Trelegy) once she is approved for insurance. Will investigate PAP programs ([employer] for [place]), if Medicare copays are unaffordable. - Educated on maintenance vs rescue inhaler and inhaler technique. - Recommend to continue Wixela (fluticasone -salmeterol) 250-50 mcg/act 1 puff BID, albuterol  HFA 2 puff Q6H PRN    Patient verbalized understanding of treatment plan.    Follow Up Plan:  Pharmacist 01/10/24 PCP clinic visit 03/14/24   Lorain Baseman, PharmD [facility] [phone]

## 2023-12-13 NOTE — Progress Notes (Signed)
 [name]:     [name], PharmD [facility] [phone]

## 2023-12-14 ENCOUNTER — Ambulatory Visit: Payer: Self-pay | Admitting: Nurse Practitioner

## 2023-12-14 DIAGNOSIS — N289 Disorder of kidney and ureter, unspecified: Secondary | ICD-10-CM

## 2024-01-09 ENCOUNTER — Other Ambulatory Visit: Payer: Self-pay | Admitting: Nurse Practitioner

## 2024-01-09 DIAGNOSIS — I159 Secondary hypertension, unspecified: Secondary | ICD-10-CM

## 2024-01-10 ENCOUNTER — Other Ambulatory Visit (HOSPITAL_COMMUNITY): Payer: Self-pay

## 2024-01-10 ENCOUNTER — Other Ambulatory Visit: Payer: Self-pay

## 2024-01-10 DIAGNOSIS — I159 Secondary hypertension, unspecified: Secondary | ICD-10-CM

## 2024-01-10 DIAGNOSIS — F331 Major depressive disorder, recurrent, moderate: Secondary | ICD-10-CM

## 2024-01-10 DIAGNOSIS — Z79899 Other long term (current) drug therapy: Secondary | ICD-10-CM

## 2024-01-10 DIAGNOSIS — J449 Chronic obstructive pulmonary disease, unspecified: Secondary | ICD-10-CM

## 2024-01-10 DIAGNOSIS — F32A Depression, unspecified: Secondary | ICD-10-CM

## 2024-01-10 DIAGNOSIS — M545 Low back pain, unspecified: Secondary | ICD-10-CM

## 2024-01-10 DIAGNOSIS — I4821 Permanent atrial fibrillation: Secondary | ICD-10-CM

## 2024-01-10 DIAGNOSIS — E1169 Type 2 diabetes mellitus with other specified complication: Secondary | ICD-10-CM

## 2024-01-10 DIAGNOSIS — I5032 Chronic diastolic (congestive) heart failure: Secondary | ICD-10-CM

## 2024-01-10 MED ORDER — TRELEGY ELLIPTA 100-62.5-25 MCG/ACT IN AEPB
1.0000 | INHALATION_SPRAY | Freq: Every day | RESPIRATORY_TRACT | 1 refills | Status: DC
  Filled 2024-01-10 – 2024-02-07 (×2): qty 180, 90d supply, fill #0

## 2024-01-10 MED ORDER — ROSUVASTATIN CALCIUM 10 MG PO TABS
10.0000 mg | ORAL_TABLET | Freq: Every day | ORAL | 3 refills | Status: AC
  Filled 2024-01-10: qty 90, 90d supply, fill #0
  Filled 2024-01-11: qty 30, 30d supply, fill #0
  Filled 2024-02-07: qty 30, 30d supply, fill #1
  Filled 2024-02-23 – 2024-03-08 (×2): qty 30, 30d supply, fill #2

## 2024-01-10 MED ORDER — ALBUTEROL SULFATE HFA 108 (90 BASE) MCG/ACT IN AERS
2.0000 | INHALATION_SPRAY | Freq: Four times a day (QID) | RESPIRATORY_TRACT | 11 refills | Status: AC | PRN
  Filled 2024-01-10 – 2024-01-11 (×2): qty 6.7, 25d supply, fill #0
  Filled 2024-02-07: qty 6.7, 25d supply, fill #1
  Filled 2024-02-23: qty 6.7, 25d supply, fill #2

## 2024-01-10 MED ORDER — TRAZODONE HCL 100 MG PO TABS
ORAL_TABLET | ORAL | 1 refills | Status: DC
  Filled 2024-01-10 – 2024-01-11 (×2): qty 30, 30d supply, fill #0
  Filled 2024-02-07: qty 30, 30d supply, fill #1

## 2024-01-10 MED ORDER — DULOXETINE HCL 60 MG PO CPEP
60.0000 mg | ORAL_CAPSULE | Freq: Every day | ORAL | 3 refills | Status: AC
  Filled 2024-01-10: qty 90, 90d supply, fill #0
  Filled 2024-01-11: qty 30, 30d supply, fill #0
  Filled 2024-02-07: qty 30, 30d supply, fill #1
  Filled 2024-02-23 – 2024-03-08 (×2): qty 30, 30d supply, fill #2

## 2024-01-10 MED ORDER — DILTIAZEM HCL ER COATED BEADS 360 MG PO CP24
ORAL_CAPSULE | ORAL | 1 refills | Status: AC
  Filled 2024-01-10: qty 90, 90d supply, fill #0
  Filled 2024-01-11: qty 30, 30d supply, fill #0
  Filled 2024-02-07: qty 30, 30d supply, fill #1
  Filled 2024-02-23 – 2024-03-08 (×2): qty 30, 30d supply, fill #2

## 2024-01-10 MED ORDER — POTASSIUM CHLORIDE ER 10 MEQ PO TBCR
EXTENDED_RELEASE_TABLET | ORAL | 3 refills | Status: AC
  Filled 2024-01-10 – 2024-01-11 (×2): qty 180, 30d supply, fill #0
  Filled 2024-02-07: qty 180, 30d supply, fill #1
  Filled 2024-02-23 – 2024-03-08 (×2): qty 180, 30d supply, fill #2

## 2024-01-10 MED ORDER — FUROSEMIDE 40 MG PO TABS
80.0000 mg | ORAL_TABLET | Freq: Every day | ORAL | 1 refills | Status: AC
  Filled 2024-01-10: qty 90, 45d supply, fill #0
  Filled 2024-02-23: qty 90, 45d supply, fill #1

## 2024-01-10 MED ORDER — BISOPROLOL FUMARATE 5 MG PO TABS
ORAL_TABLET | ORAL | 3 refills | Status: AC
  Filled 2024-01-10: qty 90, 90d supply, fill #0
  Filled 2024-01-11: qty 30, 30d supply, fill #0
  Filled 2024-02-07: qty 30, 30d supply, fill #1
  Filled 2024-02-23 – 2024-03-08 (×2): qty 30, 30d supply, fill #2

## 2024-01-10 MED ORDER — MONTELUKAST SODIUM 10 MG PO TABS
10.0000 mg | ORAL_TABLET | Freq: Every day | ORAL | 3 refills | Status: DC
  Filled 2024-01-10: qty 90, 90d supply, fill #0
  Filled 2024-01-11: qty 30, 30d supply, fill #0

## 2024-01-10 MED ORDER — APIXABAN 5 MG PO TABS
5.0000 mg | ORAL_TABLET | Freq: Two times a day (BID) | ORAL | 1 refills | Status: DC
  Filled 2024-01-10 – 2024-01-11 (×2): qty 60, 30d supply, fill #0
  Filled 2024-02-07: qty 60, 30d supply, fill #1

## 2024-01-10 MED ORDER — GABAPENTIN 300 MG PO CAPS
300.0000 mg | ORAL_CAPSULE | Freq: Every day | ORAL | 1 refills | Status: AC
  Filled 2024-01-10: qty 90, 90d supply, fill #0
  Filled 2024-01-11: qty 30, 30d supply, fill #0
  Filled 2024-02-07 – 2024-02-10 (×2): qty 30, 30d supply, fill #1
  Filled 2024-02-23 – 2024-03-08 (×2): qty 30, 30d supply, fill #2

## 2024-01-10 NOTE — Progress Notes (Signed)
 01/10/2024 Name: [name] MRN: [id number] DOB: 1960/09/14  Chief Complaint  Patient presents with   COPD   Congestive Heart Failure   Diabetes    [name] is a 62 y.o. year old female who was referred for medication management by their primary care provider, Oley Bascom RAMAN, NP. They presented for a telephone visit today.   They were referred to the pharmacist by their PCP for assistance in managing medication access . PMH includes Afib, HTN, HFpEF (EF 60-65% in 2022), pulmonary HTN due to COPD, OSA, COPD, OSA on CPAP, GERD, hypothyroidism, HLD, T2DM, CKD3, depression.   Subjective: Patient was last seen by PCP, Bascom Oley, NP, on 09/09/23. At last visit, patient reported that she stopped Xarelto  due to cost. She reported starting herself on aspirin . Her A1C was 6.5%. She also reported ongoing ShOB but cannot afford inhalers. Urgent needs relayed to pharmacy team on 09/26/23 included inhaler access. Lang Sieve, PharmD triaged this request while I was out of town, and determined with patient's insurance plan that Napoleon is the only preferred maintenance inhaler on her plan at cost of $20.83 per 90ds (all other alternatives > $600/30ds). At pharmacy appt on 11/09/23, patient reported Medicare would become active 12/10/23. She reported having medications except Xarelto , which had not been filled since 2023 due to cost. She has been taking aspirin  alone instead. We checked a BMP which demonstrated hypokalemia. She was instructed to resume potassium with furosemide . She was also instructed to follow-up with Adv HF. At pharmacy call on 12/13/23, discussed that potassium had improved. Found out Medicare Part D plan did not become active until 01/09/24. Assisted in getting patient medications until that date. Preemptively enrolled in cardiomyopathy [employer] to assist with coverage of Eliquis.   Today, patient reports doing ok. She states she stopped metformin  and rosuvastatin  because  she thought they were contributing to constipation and shortness of breath. She is willing to resume rosuvastatin . She has only been using her inhalers when she goes out of the house. Reports she continues to have swelling, but it is at her baseline and currently unchanged. Has not outreached AHF clinic to schedule yet.    Care Team: Primary Care Provider: Oley Bascom RAMAN, NP ; Next Scheduled Visit: 03/14/24   Medication Access/Adherence  Current Pharmacy:  [id number] [place], [place] - [address] [place] [address] Phone: [phone] Fax: [phone]  [id number] - [place]), [place] - [address] [place] [address] Phone: [phone] Fax: [phone]  [id number] [place], [place] - [address] AT [facility] [address] [place] [address] Phone: [phone] Fax: [phone]  [facility] [facility] [address] [place] [address] Phone: [phone] Fax: [phone]   Patient reports affordability concerns with their medications: Yes  - now has Medicare Part A/B/D. Needs assistance with $590 deductible. Screened for Medicare LIS in the past and she was above the income limit.   Patient reports access/transportation concerns to their pharmacy: Yes  - her children have to pick up her medications, willing to switch to [facility] pharmacy for mail delivery.  Patient reports adherence concerns with their medications:  Yes  - has only been taking diltiazem , bisoprolol , furosemide  consistently over the past month   Diabetes:  Current medications: metformin  XR 500 mg daily (discontinued due to constipation and suspecting that it was worsening her  ShOB)  Hyperlipidemia/ASCVD Risk Reduction  Current lipid lowering medications: rosuvastatin  10 mg daily (discontinued due to constipation and suspecting that it was worsening  her ShOB)  Antiplatelet regimen: aspirin  81 mg daily (patient was previously treated with Xarelto  in 2023 for afib, but she discontinued this due to cost, and has only been taking aspirin  since then - she is willing to switch to preferred DOAC, Eliquis today)  ASCVD History: none known, has dx of HFpEF Family History: CAD in mother, CAD in father,  Risk Factors: T2DM, age, former smoker, HLD, HF  Heart Failure (EF 60-65%):  Current medications:  ACEi/ARB/ARNI: none SGLT2i: none Beta blocker: bisoprolol  5 mg daily (Afib) Mineralocorticoid Receptor Antagonist: none (appears she previously took spironolactone  12.5 mg daily - d/c'd in 2022 as patient needed f/u appt for further refills) Diuretic regimen: furosemide  40-80 mg daily for swelling and ShOB (usually taking furosemide  80 mg daily), metolazone  2.5 mg daily PRN (reports she takes this every 1-2 weeks) Potassium chloride  20 mEq BID with furosemide  40 mg daily and 20 mEq in the AM and 40 mg in the PM with furosemide  80 mg daily (reports she takes 3x potassium per furosemide  40 mg - most days is taking furosemide  80 mg daily and potassium 30 mEq BID)  Patient reports volume overload signs or symptoms including shortness of breath with exertion, lower extremity edema that she reports is at her baseline.  Atrial Fibrillation:  Current medications:   Rate Control: bisoprolol  5 mg daily, diltiazem  360 mg once daily Rhythm Control: none Anticoagulation Regimen: none (patient taking ASA 81 mg daily due to cost of DOAC, now willing to start Eliquis if cost accessible)  Medications tried in the past: warfarin, Xarelto    This patients CHA2DS2-VASc Score and unadjusted Ischemic Stroke Rate (% per year) is equal to 4.8 % stroke rate/year from a score of 4  COPD/Pulmonary HTN:  Current medications: Wixela (fluticasone -salmeterol) 250-50 mcg/act 1 puff BID (not taking daily as she wants to conserve doses), albuterol  HFA 2 puff Q6H PRN (takes  occasionally) - Reports she is also using an old nebulizer at home, and she has an old Symbicort  in her purse.   Medications tried in the past: Trelegy (d/c'd due to cost on high deductible plan)  Objective:  BP Readings from Last 3 Encounters:  12/12/23 102/62  09/09/23 109/68  05/09/23 131/65    Lab Results  Component Value Date   HGBA1C 6.5 (A) 09/09/2023   HGBA1C 7.0 (A) 05/09/2023   HGBA1C 7.3 (H) 12/24/2022       Latest Ref Rng & Units 12/12/2023   11:35 AM 11/09/2023   11:19 AM 12/24/2022   10:14 AM  BMP  Glucose 70 - 99 mg/dL 873  862  829   BUN 8 - 27 mg/dL 21  23  35   Creatinine 0.57 - 1.00 mg/dL 8.23  8.19  8.37   BUN/Creat Ratio 12 - 28 12   22    Sodium 134 - 144 mmol/L 141  144  139   Potassium 3.5 - 5.2 mmol/L 3.6  3.2  2.9   Chloride 96 - 106 mmol/L 98  96  88   CO2 20 - 29 mmol/L 27  27  36   Calcium  8.7 - 10.3 mg/dL 9.4  89.8  89.7     Lab Results  Component Value Date   CHOL 119 12/12/2023   HDL 50 12/12/2023   LDLCALC 51 12/12/2023   TRIG 92 12/12/2023   CHOLHDL 2.4  12/12/2023    Medications Reviewed Today     Reviewed by Brinda Lorain SQUIBB, RPH-CPP (Pharmacist) on 01/10/24 at 1342  Med List Status: <None>   Medication Order Taking? Sig Documenting Provider Last Dose Status Informant  albuterol  (VENTOLIN  HFA) 108 (90 Base) MCG/ACT inhaler [id number]  Inhale 2 puffs into the lungs every 6 (six) hours as needed for wheezing or shortness of breath. Oley Bascom RAMAN, NP  Active     Discontinued 01/10/24 1021 (Change in therapy)   aspirin -acetaminophen -caffeine (EXCEDRIN MIGRAINE) 250-250-65 MG per tablet [id number]  Take 1 tablet by mouth every 6 (six) hours as needed for headache. [provider]  Active Self  bisoprolol  (ZEBETA ) 5 MG tablet [id number]  TAKE 1 TABLET BY MOUTH DAILY FOR BLOOD PRESSURE (DX: I.10) Oley Bascom RAMAN, NP  Active   Calcium  Citrate-Vitamin D  (CALCIUM  + D PO) [id number]  Take by mouth. [provider]  Active    Cyanocobalamin (B-12 PO) [id number]  Take by mouth. [provider]  Active   diltiazem  (CARDIZEM  CD) 360 MG 24 hr capsule [id number]  TAKE 1 CAPSULE BY MOUTH DAILY FOR BLOOD PRESSURE & HEART RHYTHM Nichols, Tonya S, NP  Active   diphenhydrAMINE HCl, Sleep, 50 MG CAPS [id number]  Take 1 capsule by mouth daily. [provider]  Active Self  DULoxetine  (CYMBALTA ) 60 MG capsule [id number]  TAKE 1 CAPSULE BY MOUTH EVERY DAY Webb, Padonda B, FNP  Active     Discontinued 01/10/24 1022 (Change in therapy)   furosemide  (LASIX ) 40 MG tablet [id number] Yes TAKE 1 TABLET 2 TO 3 X /DAY AS DIRECTED FOR BP & FLUID RETENTION / ANKLE SWELLING  Patient taking differently: TAKE 1 TABLET 2 TO 3 X /DAY AS DIRECTED FOR BP & FLUID RETENTION / ANKLE SWELLING   Oley Bascom RAMAN, NP  Active   gabapentin  (NEURONTIN ) 300 MG capsule [id number]  Take 1 capsule (300 mg total) by mouth at bedtime. Oley Bascom RAMAN, NP  Active   ipratropium-albuterol  (DUONEB) 0.5-2.5 (3) MG/3ML SOLN [id number]  USE 1 NEBULE VIA NEBULIZATION EVERY 4 HOURS AS NEEDED (MAX 6 DOSES PER DAY) Wilkinson, Dana E, NP  Active   metFORMIN  (GLUCOPHAGE -XR) 500 MG 24 hr tablet [id number]  Take 1 tablet (500 mg total) by mouth 2 (two) times daily with a meal.  Patient not taking: Reported on 01/10/2024   Oley Bascom RAMAN, NP  Active   metolazone  (ZAROXOLYN ) 2.5 MG tablet [id number] Yes TAKE 1 TABLET (2.5 MG TOTAL) BY MOUTH AS NEEDED.  Patient taking differently: TAKE 1 TABLET (2.5 MG TOTAL) BY MOUTH AS NEEDED.   Oley Bascom RAMAN, NP  Active   montelukast  (SINGULAIR ) 10 MG tablet [id number]  TAKE 1 TABLET BY MOUTH EVERYDAY AT BEDTIME Nichols, Tonya S, NP  Active   potassium chloride  (KLOR-CON ) 10 MEQ tablet [id number]  TAKE 2 TABLETS 2 X /DAY FOR POTASSIUM & TAKE ADDITIONAL 2 TABLETS WITH ADDITIONAL DOSE OF LASIX  Nichols, Tonya S, NP  Active   rosuvastatin  (CRESTOR ) 10 MG tablet [id number]  TAKE 1 TABLET BY MOUTH EVERY DAY  Patient not taking: Reported on  01/10/2024   Wilkinson, Dana E, NP  Active   traZODone  (DESYREL ) 100 MG tablet [id number]  TAKE 1 TABLET BY MOUTH 1 HOUR BEFORE BEDTIME AS NEEDED FOR SLEEP Oley Bascom RAMAN, NP  Active               Assessment/Plan:   Diabetes: - Currently controlled with most recent A1C of 6.5% below goal <7% on  metformin  XR 500 mg daily, however patient has since discontinued metformin . eGFR is close to cut off of < 30 mL/min, so alternative antihyperglycemic is likely appropriate. She has multiple indications for SGLT2i with HF and CKD, but with multiple medication changes today and lack of ability to evaluate fluid status, will re-discuss initiation at follow-up. May also benefit from GLP-1RA or GLP-1/GIP RA in the future for glycemic control. - Last UACR March 2025 - 15 mg/g - Reviewed long term cardiovascular and renal outcomes of uncontrolled blood sugar - Recommend to hold metformin  XR 500 mg daily and focus on lifestyle interventions to control blood sugars until repeat A1C - Next A1C due now. Attempted to schedule patient earlier than Feb 2026, but she declined.    Hyperlipidemia/ASCVD Risk Reduction: - Currently controlled with most recent LDL-C of 51 mg/dL below goal < 70 mg/dL given U7IF + co morbidities. Statin fill hx is appropriate.  - Reviewed long term complications of uncontrolled cholesterole - Recommend to resume rosuvastatin  10 mg daily and monitor for side effects   Heart Failure and Pulmonary HTN due to COPD: - Currently inappropriately managed/opportunity for optimization as patient has been lost to follow up with Adv HF clinic. Have encouraged her to schedule appt multiple times. Labs are stable, but she would likely benefit from addition of SGLT2i and MRA to reduce need for diuretic/potassium.  - Recommend to continue current regimen: Current medications:  ACEi/ARB/ARNI: none SGLT2i: none Beta blocker: bisoprolol  5 mg daily (Afib) Mineralocorticoid Receptor Antagonist: none   Diuretic regimen: furosemide  40-80 mg daily for swelling and ShOB, metolazone  2.5 mg daily PRN (~once per week) Potassium chloride  30 mEq BID with furosemide  80 mg daily - Enrolled in [employer]omyopathy fund which can be used for brand name HF medications in the future   Atrial Fibrillation: - Currently inappropriately managed due to access issues with DOAC. Since patient now has an active Part D plan, will instruct her to stop aspirin  and initiate Eliquis 5 mg BID. She is concomitantly treated with diltiazem , a moderate CYP3A4 and PGP inhibitor which can increase the plasma concentration of Eliquis and may increase risk of bleeding, however her CHADSVASC of 4 indicates a moderate-high risk of stroke. - Reviewed importance of adherence to anticoagulant for stroke prevention.  - STOP aspirin  81 mg daily - START Eliquis 5 mg BID. Counseled on administration and side effects. Will ensure pharmacy uses [employer] Cardiom[employer] grant to cover deductible.  - Continue bisoprolol  5 mg daily, diltiazem  360 mg daily   COPD: - Currently uncontrolled. Now that patient has insurance coverage again, will restart triple therapy with Trelegy per patient preference. Appears to be affordable after deductible per test claim, but can investigate PAP programs if needed.  - Educated on maintenance vs rescue inhaler and inhaler technique. Encouraged daily dosing of maintenance inhaler to alleviate shortness of breath. - Recommend to stop Wixela and START Trelegy 100-62.5-25 mcg/act 1 puff once daily. - Recommend to continue albuterol  HFA 2 puff Q6H PRN    Patient verbalized understanding of treatment plan.    Follow Up Plan:  Pharmacist telephone 02/22/24 PCP clinic visit 03/14/24   Lorain Baseman, PharmD [facility] [phone]

## 2024-01-11 ENCOUNTER — Encounter: Payer: Self-pay | Admitting: Pharmacist

## 2024-01-11 ENCOUNTER — Other Ambulatory Visit: Payer: Self-pay

## 2024-01-11 ENCOUNTER — Other Ambulatory Visit (HOSPITAL_COMMUNITY): Payer: Self-pay

## 2024-01-11 ENCOUNTER — Telehealth: Payer: Self-pay

## 2024-01-11 NOTE — Telephone Encounter (Signed)
 Collaborated with [facility] pharmacy to fill patient's maintenance medications for mail order using her newly active Medicare Part D plan. Utilized [employer] Cardiomyopathy grant for Eliquis , bisoprolol , and diltiazem . Filled remaining medications for a one month supply for a total cost of ~$71. However, this does not include Trelegy which had a coinsurance of $151 for a one month supply. Plan to see if patient is willing to apply for patient assistance program for [facility]. Will collaborate with CPhT, Burnard Lot, to initiate application.   Attempted to contact patient to provide the update above. Unable to contact. LVM with call back number. Will outreach via [employer].   Lorain Baseman, PharmD [facility] [phone]

## 2024-01-12 ENCOUNTER — Other Ambulatory Visit (HOSPITAL_COMMUNITY): Payer: Self-pay

## 2024-01-12 ENCOUNTER — Other Ambulatory Visit: Payer: Self-pay

## 2024-01-16 ENCOUNTER — Other Ambulatory Visit: Payer: Self-pay

## 2024-01-16 ENCOUNTER — Telehealth (HOSPITAL_COMMUNITY): Payer: Self-pay | Admitting: Pharmacy Technician

## 2024-01-16 ENCOUNTER — Encounter (HOSPITAL_COMMUNITY): Payer: Self-pay | Admitting: Pharmacy Technician

## 2024-01-16 ENCOUNTER — Other Ambulatory Visit (HOSPITAL_COMMUNITY): Payer: Self-pay

## 2024-01-16 ENCOUNTER — Telehealth: Payer: Self-pay | Admitting: Pharmacy Technician

## 2024-01-16 NOTE — Patient Outreach (Signed)
 Erroneous Encounter.  [name], CPhT [facility] Office: [phone] Email: [name]

## 2024-01-16 NOTE — Patient Outreach (Signed)
 Erroneous Encounter.  Enis Riecke, CPhT [facility] Office: [phone] Email: [name]

## 2024-01-16 NOTE — Progress Notes (Signed)
 01/16/2024 Name: [name] MRN: [id number] DOB: [birth year]  Patient is appearing for a follow-up visit with the population health pharmacy technician. Last engaged with the clinical pharmacist to discuss diabetes, COPD, and heart failure on 01/10/2024. Contacted patient today to discuss diabetes, COPD, heart failure, and medication access.   Plan from last clinical pharmacist appointment:  Diabetes: - Currently controlled with most recent A1C of 6.5% below goal <7% on metformin  XR 500 mg daily, however patient has since discontinued metformin . eGFR is close to cut off of < 30 mL/min, so alternative antihyperglycemic is likely appropriate. She has multiple indications for SGLT2i with HF and CKD, but with multiple medication changes today and lack of ability to evaluate fluid status, will re-discuss initiation at follow-up. May also benefit from GLP-1RA or GLP-1/GIP RA in the future for glycemic control. - Last UACR March 2025 - 15 mg/g - Reviewed long term cardiovascular and renal outcomes of uncontrolled blood sugar - Recommend to hold metformin  XR 500 mg daily and focus on lifestyle interventions to control blood sugars until repeat A1C - Next A1C due now. Attempted to schedule patient earlier than Feb 2026, but she declined.  Hyperlipidemia/ASCVD Risk Reduction: - Currently controlled with most recent LDL-C of 51 mg/dL below goal < 70 mg/dL given U7IF + co morbidities. Statin fill hx is appropriate.  - Reviewed long term complications of uncontrolled cholesterole - Recommend to resume rosuvastatin  10 mg daily and monitor for side effects Heart Failure and Pulmonary HTN due to COPD: - Currently inappropriately managed/opportunity for optimization as patient has been lost to follow up with Adv HF clinic. Have encouraged her to schedule appt multiple times. Labs are stable, but she would likely benefit from addition of SGLT2i and MRA to reduce need for diuretic/potassium.  - Recommend to  continue current regimen: Current medications:  ACEi/ARB/ARNI: none SGLT2i: none Beta blocker: bisoprolol  5 mg daily (Afib) Mineralocorticoid Receptor Antagonist: none  Diuretic regimen: furosemide  40-80 mg daily for swelling and ShOB, metolazone  2.5 mg daily PRN (~once per week) Potassium chloride  30 mEq BID with furosemide  80 mg daily - Enrolled in [employer] Cardiomyopathy fund which can be used for brand name HF medications in the future Atrial Fibrillation: - Currently inappropriately managed due to access issues with DOAC. Since patient now has an active Part D plan, will instruct her to stop aspirin  and initiate Eliquis  5 mg BID. She is concomitantly treated with diltiazem , a moderate CYP3A4 and PGP inhibitor which can increase the plasma concentration of Eliquis  and may increase risk of bleeding, however her CHADSVASC of 4 indicates a moderate-high risk of stroke. - Reviewed importance of adherence to anticoagulant for stroke prevention.  - STOP aspirin  81 mg daily - START Eliquis  5 mg BID. Counseled on administration and side effects. Will ensure pharmacy uses [employer] Cardiom[employer] grant to cover deductible.  - Continue bisoprolol  5 mg daily, diltiazem  360 mg daily COPD: - Currently uncontrolled. Now that patient has insurance coverage again, will restart triple therapy with Trelegy per patient preference. Appears to be affordable after deductible per test claim, but can investigate PAP programs if needed.  - Educated on maintenance vs rescue inhaler and inhaler technique. Encouraged daily dosing of maintenance inhaler to alleviate shortness of breath. - Recommend to stop Wixela and START Trelegy 100-62.5-25 mcg/act 1 puff once daily. - Recommend to continue albuterol  HFA 2 puff Q6H PRN  -Patient verbalized understanding of treatment plan.  -Follow Up Plan:  Pharmacist telephone 02/22/24 PCP clinic visit 03/14/24(copy/paste from  last note)   Medication Adherence Barriers  Identified:  Patient made recommended medication changes per plan: Yes Patient is aware of the changes to be made, however, she has not started the new plan yet as she is still waiting on the medications to be delivered. We did go through the changes that the pharmacist outlined. Per Patient, she is not taking Metformin  due to side effects. She did restart Rosuvastatin  10mg  daily. She is taking Bisoprolol  5mg  daily and Furosemide  80mg  daily. She typically takes Metolazaone 2.5mg  as needed but usually it is once per week. She also takes 6 tablets of potassium daily with 3x10meq in the morning and 3x33meq in the evening. She also reports adherence to taking Diltizaem 360mg  daily and Albuterol  as needed. She is aware to stop Aspirin  and to start Eliquis  5mg  twice a day when it is received. She has a maintenance inhaler that she is using while awaiting patient assistance with Breztri as Trelegy was cost prohibitive. Access issues with any new medication or testing device: Yes Attempting to enroll patient for [facility] with [employer] patient assistance. Per CCM  Medication Dispense History report, Rosuvastatin , Bisoprolol , Furosemide , Potassium, Eliquis , Diltiazem   and Albtuerol were all filled on 01/13/24. Patient Is awaiting arrival of shipment. She was provided name and number to call if not received by Wednesday 01/18/2024. Patient portion of patient assistance completed: Yes She informs she spoke to [place], CPhT this morning.   Medication Adherence Barriers Addressed/Actions Taken:  Reviewed medication changes per plan from last clinical pharmacist note Medication Access for [facility] with [employer] Will discuss medication access concerns with pharmacist  Patient Advocate team has already initiated PAP application Educated patient to contact pharmacy regarding refills Reminded patient of date/time of upcoming clinical pharmacist follow up and any upcoming PCP/specialists visits. Patient denies transportation  barriers to the appointment. No  Next clinical pharmacist appointment is scheduled for: 02/22/2024 via phone call  Kate Caddy, CPhT [facility] Office: [phone] Email: [email or web address]

## 2024-01-17 ENCOUNTER — Telehealth: Payer: Self-pay | Admitting: Pharmacy Technician

## 2024-01-17 NOTE — Progress Notes (Signed)
 01/17/2024 Name: [name] MRN: [id number] DOB: 22-Mar-1960  Patient is appearing for a follow-up visit with the population health pharmacy technician. Last engaged with the clinical pharmacist to discuss diabetes, COPD, heart failure, and medication access on 01/10/2024. Incoming call received today from patient.  Plan from last clinical pharmacist appointment:  Diabetes: - Currently controlled with most recent A1C of 6.5% below goal <7% on metformin  XR 500 mg daily, however patient has since discontinued metformin . eGFR is close to cut off of < 30 mL/min, so alternative antihyperglycemic is likely appropriate. She has multiple indications for SGLT2i with HF and CKD, but with multiple medication changes today and lack of ability to evaluate fluid status, will re-discuss initiation at follow-up. May also benefit from GLP-1RA or GLP-1/GIP RA in the future for glycemic control. - Last UACR March 2025 - 15 mg/g - Reviewed long term cardiovascular and renal outcomes of uncontrolled blood sugar - Recommend to hold metformin  XR 500 mg daily and focus on lifestyle interventions to control blood sugars until repeat A1C - Next A1C due now. Attempted to schedule patient earlier than Feb 2026, but she declined.  Hyperlipidemia/ASCVD Risk Reduction: - Currently controlled with most recent LDL-C of 51 mg/dL below goal < 70 mg/dL given U7IF + co morbidities. Statin fill hx is appropriate.  - Reviewed long term complications of uncontrolled cholesterole - Recommend to resume rosuvastatin  10 mg daily and monitor for side effects Heart Failure and Pulmonary HTN due to COPD: - Currently inappropriately managed/opportunity for optimization as patient has been lost to follow up with Adv HF clinic. Have encouraged her to schedule appt multiple times. Labs are stable, but she would likely benefit from addition of SGLT2i and MRA to reduce need for diuretic/potassium.  - Recommend to continue current  regimen: Current medications:  ACEi/ARB/ARNI: none SGLT2i: none Beta blocker: bisoprolol  5 mg daily (Afib) Mineralocorticoid Receptor Antagonist: none  Diuretic regimen: furosemide  40-80 mg daily for swelling and ShOB, metolazone  2.5 mg daily PRN (~once per week) Potassium chloride  30 mEq BID with furosemide  80 mg daily - Enrolled in [employer] fund which can be used for brand name HF medications in the future Atrial Fibrillation: - Currently inappropriately managed due to access issues with DOAC. Since patient now has an active Part D plan, will instruct her to stop aspirin  and initiate Eliquis  5 mg BID. She is concomitantly treated with diltiazem , a moderate CYP3A4 and PGP inhibitor which can increase the plasma concentration of Eliquis  and may increase risk of bleeding, however her CHADSVASC of 4 indicates a moderate-high risk of stroke. - Reviewed importance of adherence to anticoagulant for stroke prevention.  - STOP aspirin  81 mg daily - START Eliquis  5 mg BID. Counseled on administration and side effects. Will ensure pharmacy uses [employer] grant to cover deductible.  - Continue bisoprolol  5 mg daily, diltiazem  360 mg daily COPD: - Currently uncontrolled. Now that patient has insurance coverage again, will restart triple therapy with Trelegy per patient preference. Appears to be affordable after deductible per test claim, but can investigate PAP programs if needed.  - Educated on maintenance vs rescue inhaler and inhaler technique. Encouraged daily dosing of maintenance inhaler to alleviate shortness of breath. - Recommend to stop Wixela and START Trelegy 100-62.5-25 mcg/act 1 puff once daily. - Recommend to continue albuterol  HFA 2 puff Q6H PRN  -Patient verbalized understanding of treatment plan.  -Follow Up Plan:  Pharmacist telephone 02/22/24 PCP clinic visit 03/14/24(copy/paste from last note)   Medication  Adherence Barriers Identified:  Access  issues with any new medication or testing device: Yes Albuterol  inhaler, Eliquis , Bisoprolol , Diltiazem , Duloxetine , Furosemide , Gabapentin , Montelukast , Potassium, Rosuvastatin , Trazodone    Medication Adherence Barriers Addressed/Actions Taken:  Reviewed medication changes per plan from last clinical pharmacist note Medication Access for Albuterol  Inhaler, Eliquis , Bisoprolol , Diltiazem . Duloxetine , Furosemide , Gabapentin , Montelukast , Potassium, Rosuvastatin , Trazodone  Patient call me to inform that she received these medications today Patient informs the cost of approximately $72 was affordable for her at this time. She is unsure how it will be come 1st of the year when the deductible comes into play. Patient called to inform that she did not want to continue with Montelukast  as she does not feel it is helping and she has been having side effects. She does not want it to be refilled again. Will inform PharmD of patient's decision. Patient verbalized plan set forth by PharmD.  Next clinical pharmacist appointment is scheduled for: 02/22/2024  Ronin Crager, CPhT [facility] Office: [phone] Email: [email or web address]

## 2024-01-23 ENCOUNTER — Other Ambulatory Visit: Payer: Self-pay

## 2024-01-27 ENCOUNTER — Other Ambulatory Visit: Payer: Self-pay

## 2024-01-30 NOTE — Progress Notes (Unsigned)
 [name]

## 2024-01-31 ENCOUNTER — Telehealth: Payer: Commercial Managed Care - HMO | Admitting: Adult Health

## 2024-01-31 DIAGNOSIS — G4733 Obstructive sleep apnea (adult) (pediatric): Secondary | ICD-10-CM

## 2024-01-31 NOTE — Progress Notes (Signed)
 "    PATIENT: [name] DOB: [birth year]  REASON FOR VISIT: follow up HISTORY FROM: patient PRIMARY NEUROLOGIST: Dr. Chalice  Virtual Visit via Video Note  I connected with [name] on 01/31/2024 at  3:00 PM EST by a video enabled telemedicine application located remotely at [facility] and verified that I am speaking with the correct person using two identifiers who was located at their own home.   I discussed the limitations of evaluation and management by telemedicine and the availability of in person appointments. The patient expressed understanding and agreed to proceed.   PATIENT: [name] DOB: 11-Dec-1960  REASON FOR VISIT: follow up HISTORY FROM: patient  HISTORY OF PRESENT ILLNESS: Today 01/31/2024:  [name] is a 63 y.o. female with a history of obstructive sleep apnea on CPAP. Returns today for follow-up.  She states that she uses the CPAP consistently.  Does not sleep without it.  At the last visit she reported being on oxygen  during the day and night.  However states that she has not been able to get this refilled.  Reports that she bought her own tank and has been using it at night although she is not clear if she is getting oxygen .  She reports that this was prescribed by cardiology.  However it appears she has not seen cardiology in 5 years?  Her download is below     01/25/23: [name] is a 63 y.o. female with a history of OSA on CPAP. Returns today for follow-up.  Reports that the CPAP is working well.  Denies any new issues.  She does wear O2 during the day and at night.  She believes this was prescribed by her cardiologist.  Her download is below    01/19/22: [name] is a 63 year old female with a history of obstructive sleep apnea on CPAP.  She returns today for follow-up.  Her download is below.  She reports that the CPAP is working well for her.  Denies any new issues.  States that she cannot sleep without  it.    01/13/21:[name] is a 63 year old female with a history of obstructive sleep apnea on CPAP.  She reports that the CPAP is working well for her.  She denies any new issues.  Her download is below.    HISTORY 12/04/19:   [name] is a 63 year old female with a history of obstructive sleep apnea on CPAP.  Her download indicates that she use her machine nightly for compliance of 100%.  She use her machine greater than 4 hours each night.  On average she uses her machine 10 hours and 1 minute.  Her residual AHI is 0.9 on 12 cm of water with EPR 3.  Leak in the 95th percentile is 14.2 L/min.  She reports that the CPAP is working well for her.  She is interested in losing weight however reports that she has a sedentary job and has not been watching her diet.  REVIEW OF SYSTEMS: Out of a complete 14 system review of symptoms, the patient complains only of the following symptoms, and all other reviewed systems are negative.  ALLERGIES: Allergies  Allergen Reactions   Sulfonamide Derivatives Hives and Itching   Tikosyn [Dofetilide]     Prolonged qt    HOME MEDICATIONS: Outpatient Medications Prior to Visit  Medication Sig Dispense Refill   albuterol  (VENTOLIN  HFA) 108 (90 Base) MCG/ACT inhaler Inhale 2 puffs into the lungs every 6 (six) hours  as needed for wheezing or shortness of breath. 6.7 g 11   apixaban  (ELIQUIS ) 5 MG TABS tablet Take 1 tablet (5 mg total) by mouth 2 (two) times daily. 60 tablet 1   aspirin -acetaminophen -caffeine (EXCEDRIN MIGRAINE) 250-250-65 MG per tablet Take 1 tablet by mouth every 6 (six) hours as needed for headache.     bisoprolol  (ZEBETA ) 5 MG tablet TAKE 1 TABLET BY MOUTH DAILY FOR BLOOD PRESSURE (DX: I.10) 90 tablet 3   Calcium  Citrate-Vitamin D  (CALCIUM  + D PO) Take by mouth.     Cyanocobalamin  (B-12 PO) Take by mouth.     diltiazem  (CARDIZEM  CD) 360 MG 24 hr capsule TAKE 1 CAPSULE BY MOUTH DAILY FOR BLOOD PRESSURE & HEART RHYTHM 90 capsule 1    diphenhydrAMINE HCl, Sleep, 50 MG CAPS Take 1 capsule by mouth daily.     DULoxetine  (CYMBALTA ) 60 MG capsule Take 1 capsule (60 mg total) by mouth daily. 90 capsule 3   Fluticasone -Umeclidin-Vilant (TRELEGY ELLIPTA ) 100-62.5-25 MCG/ACT AEPB Inhale 1 puff into the lungs daily. 180 each 1   furosemide  (LASIX ) 40 MG tablet Take 2 tablets (80 mg total) by mouth daily. May take extra tablet as needed for fluid retention and ankle swelling. 90 tablet 1   gabapentin  (NEURONTIN ) 300 MG capsule Take 1 capsule (300 mg total) by mouth at bedtime. 90 capsule 1   ipratropium-albuterol  (DUONEB) 0.5-2.5 (3) MG/3ML SOLN USE 1 NEBULE VIA NEBULIZATION EVERY 4 HOURS AS NEEDED (MAX 6 DOSES PER DAY) 540 mL 3   metFORMIN  (GLUCOPHAGE -XR) 500 MG 24 hr tablet Take 1 tablet (500 mg total) by mouth 2 (two) times daily with a meal. (Patient not taking: Reported on 01/10/2024) 60 tablet 2   metolazone  (ZAROXOLYN ) 2.5 MG tablet TAKE 1 TABLET (2.5 MG TOTAL) BY MOUTH AS NEEDED. 30 tablet 1   montelukast  (SINGULAIR ) 10 MG tablet Take 1 tablet (10 mg total) by mouth daily. (Patient not taking: Reported on 01/17/2024) 90 tablet 3   potassium chloride  (KLOR-CON ) 10 MEQ tablet Take 3 tablets (30 mEq) twice daily with furosemide  (Lasix ) 80 mg daily 180 tablet 3   rosuvastatin  (CRESTOR ) 10 MG tablet Take 1 tablet (10 mg total) by mouth daily. 90 tablet 3   traZODone  (DESYREL ) 100 MG tablet TAKE 1 TABLET BY MOUTH 1 HOUR BEFORE BEDTIME AS NEEDED FOR SLEEP 30 tablet 1   No facility-administered medications prior to visit.    PAST MEDICAL HISTORY: Past Medical History:  Diagnosis Date   Allergy    Anemia    Anxiety    Atrial flutter (HCC)    Borderline diabetes    Cancer (HCC)    Cataract    CHF (congestive heart failure) (HCC)    Chronic diastolic heart failure (HCC)    Chronic kidney disease    COPD (chronic obstructive pulmonary disease) (HCC)    Depression    GERD (gastroesophageal reflux disease)    HTN (hypertension)     Hyperlipidemia    Obstructive sleep apnea    Oxygen  deficiency    Persistent atrial fibrillation (HCC)    Myoview 4/09: No ischemia;  echo 4/09: EF 60-65%;   Flecainide , beta blocker, Coumadin ;    Unable to take Tikosyn 2/2 prolonged QT   Pulmonary embolism (HCC)    Right lower lobe diagnosed by CT 6/12   Sleep apnea    Type II or unspecified type diabetes mellitus without mention of complication, not stated as uncontrolled     PAST SURGICAL HISTORY: Past Surgical History:  Procedure Laterality Date   basal skin can     CARDIOVERSION  01/19/2012   Procedure: CARDIOVERSION;  Surgeon: Toribio JONELLE Fuel, MD;  Location: [facility] ENDOSCOPY;  Service: Cardiovascular;  Laterality: N/A;   CARDIOVERSION  01/21/2012   Procedure: CARDIOVERSION;  Surgeon: Reyes JONETTA Forget, MD;  Location: [facility] ENDOSCOPY;  Service: Cardiovascular;  Laterality: N/A;  Rm [id number]   TEE WITHOUT CARDIOVERSION  01/19/2012   Procedure: TRANSESOPHAGEAL ECHOCARDIOGRAM (TEE);  Surgeon: Toribio JONELLE Fuel, MD;  Location[facility] ENDOSCOPY;  Service: Cardiovascular;  Laterality: N/A;   TUBAL LIGATION  1996    FAMILY HISTORY: Family History  Problem Relation Age of Onset   Colon cancer Mother    Coronary artery disease Mother    Cancer Mother    Hypertension Mother    Coronary artery disease Father    Asthma Father    Emphysema Father    Allergies Father    COPD Father    Heart disease Father    Hypertension Father    Heart attack Maternal Grandfather    Heart disease Maternal Grandmother    Depression Daughter    Anxiety disorder Daughter    ADD / ADHD Son    Alcohol abuse Son    Drug abuse Son     SOCIAL HISTORY: Social History   Socioeconomic History   Marital status: Legally Separated    Spouse name: n/a   Number of children: 4   Years of education: 12   Highest education level: 12th grade  Occupational History   Occupation: clerical    Comment: Printmaker  Tobacco Use   Smoking status: Former    Current  packs/day: 0.00    Average packs/day: 2.2 packs/day for 46.7 years (105.0 ttl pk-yrs)    Types: Cigarettes    Start date: 09/09/1974    Quit date: 09/08/2009    Years since quitting: 14.4   Smokeless tobacco: Never  Vaping Use   Vaping status: Never Used  Substance and Sexual Activity   Alcohol use: No   Drug use: No   Sexual activity: Never    Birth control/protection: None  Other Topics Concern   Not on file  Social History Narrative   Married but currently separated with 4 children, 2 of whom still live with her.  Youngest son is a holiday representative in [place], to graduate in 07/2012.   Social Drivers of Health   Tobacco Use: Medium Risk (12/12/2023)   Patient History    Smoking Tobacco Use: Former    Smokeless Tobacco Use: Never    Passive Exposure: Not on file  Financial Resource Strain: Low Risk (08/11/2023)   Overall Financial Resource Strain (CARDIA)    Difficulty of Paying Living Expenses: Not very hard  Food Insecurity: Food Insecurity Present (08/11/2023)   Epic    Worried About Programme Researcher, Broadcasting/film/video in the Last Year: Sometimes true    Ran Out of Food in the Last Year: Sometimes true  Transportation Needs: Unmet Transportation Needs (08/11/2023)   Epic    Lack of Transportation (Medical): Yes    Lack of Transportation (Non-Medical): Yes  Physical Activity: Inactive (08/11/2023)   Exercise Vital Sign    Days of Exercise per Week: 0 days    Minutes of Exercise per Session: Not on file  Stress: Stress Concern Present (08/11/2023)   [employer] of Occupational Health - Occupational Stress Questionnaire    Feeling of Stress: Rather much  Social Connections: Socially Isolated (08/11/2023)   Social Connection and Isolation  Panel    Frequency of Communication with Friends and Family: Twice a week    Frequency of Social Gatherings with Friends and Family: Once a week    Attends Religious Services: Never    Database Administrator or Organizations: No    Attends Engineer, Structural: Not  on file    Marital Status: Separated  Intimate Partner Violence: Not on file  Depression (PHQ2-9): High Risk (05/09/2023)   Depression (PHQ2-9)    PHQ-2 Score: 13  Alcohol Screen: Not on file  Housing: Low Risk (08/11/2023)   Epic    Unable to Pay for Housing in the Last Year: No    Number of Times Moved in the Last Year: 0    Homeless in the Last Year: No  Utilities: Not on file  Health Literacy: Not on file      PHYSICAL EXAM Generalized: Well developed, in no acute distress   Neurological examination  Mentation: Alert oriented to time, place, history taking. Follows all commands speech and language fluent   DIAGNOSTIC DATA (LABS, IMAGING, TESTING) - I reviewed patient records, labs, notes, testing and imaging myself where available.  Lab Results  Component Value Date   WBC 8.2 12/12/2023   HGB 14.2 12/12/2023   HCT 44.2 12/12/2023   MCV 89 12/12/2023   PLT 222 12/12/2023      Component Value Date/Time   NA 141 12/12/2023 1135   K 3.6 12/12/2023 1135   CL 98 12/12/2023 1135   CO2 27 12/12/2023 1135   GLUCOSE 126 (H) 12/12/2023 1135   GLUCOSE 170 (H) 12/24/2022 1014   BUN 21 12/12/2023 1135   CREATININE 1.76 (H) 12/12/2023 1135   CREATININE 1.62 (H) 12/24/2022 1014   CALCIUM  9.4 12/12/2023 1135   PROT 7.1 12/12/2023 1135   ALBUMIN 4.4 12/12/2023 1135   AST 15 12/12/2023 1135   ALT 18 12/12/2023 1135   ALKPHOS 118 12/12/2023 1135   BILITOT 0.4 12/12/2023 1135   GFRNONAA 32 (L) 03/19/2020 0941   GFRAA 38 (L) 03/19/2020 0941   Lab Results  Component Value Date   CHOL 119 12/12/2023   HDL 50 12/12/2023   LDLCALC 51 12/12/2023   TRIG 92 12/12/2023   CHOLHDL 2.4 12/12/2023   Lab Results  Component Value Date   HGBA1C 6.5 (A) 09/09/2023   Lab Results  Component Value Date   VITAMINB12 594 11/30/2018   Lab Results  Component Value Date   TSH 3.690 12/12/2023      ASSESSMENT AND PLAN 63 y.o. year old female  has a past medical history of Allergy,  Anemia, Anxiety, Atrial flutter (HCC), Borderline diabetes, Cancer (HCC), Cataract, CHF (congestive heart failure) (HCC), Chronic diastolic heart failure (HCC), Chronic kidney disease, COPD (chronic obstructive pulmonary disease) (HCC), Depression, GERD (gastroesophageal reflux disease), HTN (hypertension), Hyperlipidemia, Obstructive sleep apnea, Oxygen  deficiency, Persistent atrial fibrillation (HCC), Pulmonary embolism (HCC), Sleep apnea, and Type II or unspecified type diabetes mellitus without mention of complication, not stated as uncontrolled. here with:  OSA on CPAP  CPAP compliance excellent Residual AHI is good Encouraged patient to continue using CPAP nightly and > 4 hours each night Encouraged her to make an appointment with her cardiologist.   F/U in 1 year or sooner if needed    Duwaine Russell, MSN, NP-C 01/31/2024, 3:05 PM [facility] [address] [place], [place] [address] [phone] "

## 2024-02-01 NOTE — Progress Notes (Signed)
 SABRA

## 2024-02-01 NOTE — Addendum Note (Signed)
 Addended by: SHERRYL DUWAINE SQUIBB on: 02/01/2024 10:31 AM   Modules accepted: Orders

## 2024-02-01 NOTE — Progress Notes (Signed)
 In researching her chart it appears that the patient was on nighttime O2 prior to seeing Dr. Chalice to establish care in 2018.  It was notated in cardiology notes prior to 2018 to continue O2 at bedtime.  Patient has not had a recent follow-up with cardiology.  She reports that she is not scheduled because her insurance will be changing this year.  Patient also reports that she has not been using oxygen  during the day-still not clear who advised to use oxygen  during the daytime.  But she does report that she bought her own tank and has been using it at nighttime.  Patient needs a new CPAP machine.  We will repeat an in-lab study.  I will consult Dr. Chalice other to see if she wants to manage overnight oxygen  or if she diverts to cardiology.

## 2024-02-07 ENCOUNTER — Other Ambulatory Visit (HOSPITAL_COMMUNITY): Payer: Self-pay

## 2024-02-07 ENCOUNTER — Other Ambulatory Visit: Payer: Self-pay

## 2024-02-14 ENCOUNTER — Encounter: Payer: Self-pay | Admitting: Adult Health

## 2024-02-14 ENCOUNTER — Telehealth: Payer: Self-pay | Admitting: *Deleted

## 2024-02-14 DIAGNOSIS — G4733 Obstructive sleep apnea (adult) (pediatric): Secondary | ICD-10-CM

## 2024-02-14 NOTE — Progress Notes (Signed)
 I called pt.  Looking at the order from Dr. Chalice 08-31-2016.  4 L O2 with bipap. Pt using a cpap machine (since her bipap broke).  She states has been off oxygen  she thinks since 2023.

## 2024-02-14 NOTE — Telephone Encounter (Signed)
 SABRA

## 2024-02-14 NOTE — Telephone Encounter (Addendum)
 This has been discussed with NP., she will further advise patient if needed.    NP noted in office visit addendum  In researching her chart it appears that the patient was on nighttime O2 prior to seeing Dr. Chalice to establish care in 2018.  It was notated in cardiology notes prior to 2018 to continue O2 at bedtime.  Patient has not had a recent follow-up with cardiology.  She reports that she is not scheduled because her insurance will be changing this year.  Patient also reports that she has not been using oxygen  during the day-still not clear who advised to use oxygen  during the daytime.  But she does report that she bought her own tank and has been using it at nighttime.  Patient needs a new CPAP machine.  We will repeat an in-lab study.  I will consult Dr. Chalice other to see if she wants to manage overnight oxygen  or if she diverts to cardiology.

## 2024-02-14 NOTE — Telephone Encounter (Signed)
-----   Message from Nurse Heather BROCKS, RN sent at 01/31/2024  5:51 PM EST ----- Just saw this and its after hours ----- Message ----- From: Sherryl Bouchard, NP Sent: 01/31/2024   3:39 PM EST To: Heather LITTIE Boring, RN  Can you call DME company and see how her last oxygen  order was from.  Is unclear when it was last prescribed.  She thinks it was by cardiology.  I just want ensure that this was not something completed by our office although I do not see the orders.

## 2024-02-15 ENCOUNTER — Telehealth: Payer: Self-pay | Admitting: Adult Health

## 2024-02-15 NOTE — Telephone Encounter (Signed)
 Sent mychart need insurnace info.

## 2024-02-22 ENCOUNTER — Other Ambulatory Visit (INDEPENDENT_AMBULATORY_CARE_PROVIDER_SITE_OTHER): Payer: Self-pay

## 2024-02-22 ENCOUNTER — Other Ambulatory Visit (HOSPITAL_COMMUNITY): Payer: Self-pay

## 2024-02-22 DIAGNOSIS — J449 Chronic obstructive pulmonary disease, unspecified: Secondary | ICD-10-CM

## 2024-02-22 DIAGNOSIS — J441 Chronic obstructive pulmonary disease with (acute) exacerbation: Secondary | ICD-10-CM

## 2024-02-22 DIAGNOSIS — I4821 Permanent atrial fibrillation: Secondary | ICD-10-CM

## 2024-02-22 MED ORDER — IPRATROPIUM-ALBUTEROL 0.5-2.5 (3) MG/3ML IN SOLN
3.0000 mL | RESPIRATORY_TRACT | 3 refills | Status: AC | PRN
  Filled 2024-02-22: qty 180, 10d supply, fill #0

## 2024-02-22 MED ORDER — APIXABAN 5 MG PO TABS
5.0000 mg | ORAL_TABLET | Freq: Two times a day (BID) | ORAL | 1 refills | Status: AC
  Filled 2024-02-22 – 2024-02-29 (×2): qty 180, 90d supply, fill #0

## 2024-02-22 MED ORDER — BREZTRI AEROSPHERE 160-9-4.8 MCG/ACT IN AERO: 2.0000 | INHALATION_SPRAY | Freq: Two times a day (BID) | RESPIRATORY_TRACT | 3 refills | Status: AC

## 2024-02-22 NOTE — Progress Notes (Signed)
 "  02/22/2024 Name: [name] MRN: [id number] DOB: 02-03-1961  Chief Complaint  Patient presents with   COPD   Diabetes   Congestive Heart Failure    [name] is a 64 y.o. year old female who was referred for medication management by their primary care provider, Oley Bascom RAMAN, NP. They presented for a telephone visit today.   They were referred to the pharmacist by their PCP for assistance in managing medication access . PMH includes Afib, HTN, HFpEF (EF 60-65% in 2022), pulmonary HTN due to COPD, OSA, COPD, OSA on CPAP, GERD, hypothyroidism, HLD, T2DM, CKD3, depression.   Subjective: Patient was last seen by PCP, Bascom Oley, NP, on 09/09/23. At last visit, patient reported that she stopped Xarelto  due to cost. She reported starting herself on aspirin . Her A1C was 6.5%. She also reported ongoing ShOB but cannot afford inhalers. Urgent needs relayed to pharmacy team on 09/26/23 included inhaler access. Lang Sieve, PharmD triaged this request while I was out of town, and determined with patient's insurance plan that Napoleon is the only preferred maintenance inhaler on her plan at cost of $20.83 per 90ds (all other alternatives > $600/30ds). At pharmacy appt on 11/09/23, patient reported Medicare would become active 12/10/23. She reported having medications except Xarelto , which had not been filled since 2023 due to cost. She has been taking aspirin  alone instead. We checked a BMP which demonstrated hypokalemia. She was instructed to resume potassium with furosemide . She was also instructed to follow-up with Adv HF. At pharmacy call on 12/13/23, discussed that potassium had improved. Found out Medicare Part D plan did not become active until 01/09/24. Assisted in getting patient medications until that date. Preemptively enrolled in cardiomyopathy [employer] to assist with coverage of Eliquis . At call on 01/10/24, we facilitated switch to Eliquis . Trelegy was unaffordable, so patient was  enrolled in [employer] PAP program.   Today, patient reports doing ok. She states she had stopped Eliquis  briefly due to gum bleeding, but has since restarted without issue. Eliquis  can next be refilled on 02/29/24. She also requests refill of ipratropium-albuterol  nebulizer solution, which she uses prior to going out of the house. Confirms she is taking Breztri  2 puffs BID as prescribed. She has still not outreached AHF clinic to schedule f/u. She self resumed metformin , despite previous discussion to hold it.    Care Team: Primary Care Provider: Oley Bascom RAMAN, NP ; Next Scheduled Visit: 03/14/24   Medication Access/Adherence  Current Pharmacy:  [id number] - [place], [place] - [address] AT [facility] [address] [place] [address] Phone: [phone] Fax: [phone]  [place] - [facility] [address] [place] [address] Phone: [phone] Fax: [phone]  [facility] - [place], [place] - [address] [place] [address] Phone: [phone] Fax: [phone]   Patient reports affordability concerns with their medications: Yes  - now has Medicare Part A/B/D. Needs assistance with $590 deductible. Enrolled in [employer] and [employer] via [employer]. Screened for Medicare LIS in the past and she was above the income limit.   Patient reports access/transportation concerns to their pharmacy: Yes  - now using [facility] for mail delivery.  Patient reports adherence concerns with their medications:  Yes  - inconsistent adherence to Eliquis  as above.  Diabetes:  Current medications: metformin  XR 500 mg daily (recently restarted)  Reports she is trying to  make healthy food choices.   Hyperlipidemia/ASCVD Risk Reduction  Current lipid lowering medications: rosuvastatin  10 mg daily   Antiplatelet regimen: Eliquis  5 mg BID (Afib)  ASCVD History: none known, has dx of  HFpEF Family History: CAD in mother, CAD in father,  Risk Factors: T2DM, age, former smoker, HLD, HF  Heart Failure (EF 60-65%):  Current medications:  ACEi/ARB/ARNI: none SGLT2i: none Beta blocker: bisoprolol  5 mg daily (Afib) Mineralocorticoid Receptor Antagonist: none (appears she previously took spironolactone  12.5 mg daily - d/c'd in 2022 as patient needed f/u appt for further refills) Diuretic regimen: furosemide  40-80 mg daily for swelling and ShOB (usually taking furosemide  80 mg daily), metolazone  2.5 mg daily PRN (reports she takes this every 1-2 weeks) Potassium chloride  20 mEq BID with furosemide  40 mg daily and 20 mEq in the AM and 40 mg in the PM with furosemide  80 mg daily (reports she takes 2x potassium per furosemide  40 mg - most days is taking furosemide  80 mg daily and potassium 40 mEq daily)  Patient reports volume overload signs or symptoms including shortness of breath with exertion, lower extremity edema that she reports is at her baseline.  Atrial Fibrillation:  Current medications:   Rate Control: bisoprolol  5 mg daily, diltiazem  360 mg once daily Rhythm Control: none Anticoagulation Regimen: none (patient taking ASA 81 mg daily due to cost of DOAC, now willing to start Eliquis  if cost accessible)  Medications tried in the past: warfarin, Xarelto    This patients CHA2DS2-VASc Score and unadjusted Ischemic Stroke Rate (% per year) is equal to 4.8 % stroke rate/year from a score of 4  COPD/Pulmonary HTN:  Current medications: Breztri  (budesonide -glycopyrrolate-formoterol ) 160-9-4.8 mcg/act 2 puffs BID, albuterol  HFA 2 puff Q6H PRN (takes occasionally), ipratropium-albuterol  0.5-2.5 mg/3mL nebulizer solution Q4H PRN for ShOB (taking about once per day prior to going out of the house)  Medications tried in the past: Trelegy (d/c'd due to cost on high deductible plan), Wixela (briefly taking for improved affordability)  Objective:  BP Readings from Last 3  Encounters:  12/12/23 102/62  09/09/23 109/68  05/09/23 131/65    Lab Results  Component Value Date   HGBA1C 6.5 (A) 09/09/2023   HGBA1C 7.0 (A) 05/09/2023   HGBA1C 7.3 (H) 12/24/2022       Latest Ref Rng & Units 12/12/2023   11:35 AM 11/09/2023   11:19 AM 12/24/2022   10:14 AM  BMP  Glucose 70 - 99 mg/dL 873  862  829   BUN 8 - 27 mg/dL 21  23  35   Creatinine 0.57 - 1.00 mg/dL 8.23  8.19  8.37   BUN/Creat Ratio 12 - 28 12   22    Sodium 134 - 144 mmol/L 141  144  139   Potassium 3.5 - 5.2 mmol/L 3.6  3.2  2.9   Chloride 96 - 106 mmol/L 98  96  88   CO2 20 - 29 mmol/L 27  27  36   Calcium  8.7 - 10.3 mg/dL 9.4  89.8  89.7     Lab Results  Component Value Date   CHOL 119 12/12/2023   HDL 50 12/12/2023   LDLCALC 51 12/12/2023   TRIG 92 12/12/2023   CHOLHDL 2.4 12/12/2023    Medications Reviewed Today     Reviewed by Brinda Lorain SQUIBB, RPH-CPP (Pharmacist) on 02/22/24 at 2107  Med List Status: <None>   Medication Order Taking? Sig Documenting Provider Last Dose Status Informant  albuterol  (VENTOLIN  HFA) 108 (90  Base) MCG/ACT inhaler [id number]  Inhale 2 puffs into the lungs every 6 (six) hours as needed for wheezing or shortness of breath. Oley Bascom RAMAN, NP  Active   apixaban  (ELIQUIS ) 5 MG TABS tablet [id number]  Take 1 tablet (5 mg total) by mouth 2 (two) times daily. Oley Bascom RAMAN, NP  Active   aspirin -acetaminophen -caffeine (EXCEDRIN MIGRAINE) 250-250-65 MG per tablet [id number]  Take 1 tablet by mouth every 6 (six) hours as needed for headache. [provider]  Active Self  bisoprolol  (ZEBETA ) 5 MG tablet [id number] Yes TAKE 1 TABLET BY MOUTH DAILY FOR BLOOD PRESSURE (DX: I.10) Oley Bascom RAMAN, NP  Active   budesonide -glycopyrrolate-formoterol  (BREZTRI  AEROSPHERE) 160-9-4.8 MCG/ACT AERO inhaler [id number] Yes Inhale 2 puffs into the lungs in the morning and at bedtime. Oley Bascom RAMAN, NP  Active   Calcium  Citrate-Vitamin D  (CALCIUM  + D PO) [id number]  Take by  mouth. [provider]  Active   Cyanocobalamin  (B-12 PO) [id number]  Take by mouth. [provider]  Active   diltiazem  (CARDIZEM  CD) 360 MG 24 hr capsule [id number] Yes TAKE 1 CAPSULE BY MOUTH DAILY FOR BLOOD PRESSURE & HEART RHYTHM Nichols, Tonya S, NP  Active   diphenhydrAMINE HCl, Sleep, 50 MG CAPS [id number]  Take 1 capsule by mouth daily. [provider]  Active Self  DULoxetine  (CYMBALTA ) 60 MG capsule [id number] Yes Take 1 capsule (60 mg total) by mouth daily. Oley Bascom RAMAN, NP  Active     Discontinued 02/22/24 1103 (Cost of medication)            Med Note>> Brinda Lorain SQUIBB, RPH-CPP   02/22/2024 11:03 AM switched to Breztri     furosemide  (LASIX ) 40 MG tablet [id number] Yes Take 2 tablets (80 mg total) by mouth daily. May take extra tablet as needed for fluid retention and ankle swelling. Oley Bascom RAMAN, NP  Active   gabapentin  (NEURONTIN ) 300 MG capsule [id number] Yes Take 1 capsule (300 mg total) by mouth at bedtime. Oley Bascom RAMAN, NP  Active   ipratropium-albuterol  (DUONEB) 0.5-2.5 (3) MG/3ML SOLN [id number]  Take 3 mLs by nebulization every 4 (four) hours as needed (for shortness of breath). (MAX 6 DOSES PER DAY) Oley Bascom RAMAN, NP  Active   metFORMIN  (GLUCOPHAGE -XR) 500 MG 24 hr tablet [id number] Yes Take 1 tablet (500 mg total) by mouth 2 (two) times daily with a meal. Oley Bascom RAMAN, NP  Active   metolazone  (ZAROXOLYN ) 2.5 MG tablet [id number] Yes TAKE 1 TABLET (2.5 MG TOTAL) BY MOUTH AS NEEDED. Oley Bascom RAMAN, NP  Active    Patient not taking:   Discontinued 02/22/24 1109 (Patient Preference)   potassium chloride  (KLOR-CON ) 10 MEQ tablet [id number] Yes Take 3 tablets (30 mEq) twice daily with furosemide  (Lasix ) 80 mg daily Nichols, Tonya S, NP  Active   rosuvastatin  (CRESTOR ) 10 MG tablet [id number] Yes Take 1 tablet (10 mg total) by mouth daily. Oley Bascom RAMAN, NP  Active   traZODone  (DESYREL ) 100 MG tablet [id number] Yes TAKE 1 TABLET BY MOUTH 1 HOUR  BEFORE BEDTIME AS NEEDED FOR SLEEP Oley Bascom RAMAN, NP  Active               Assessment/Plan:   Diabetes: - Currently controlled with most recent A1C of 6.5% below goal <7%. Patient self resumed metformin  XR 500 mg daily. Ok to continue, but will repeat BMP at upcoming PCP appt to monitor eGFR as it was close to cut off < 30 mL/min in Nov  2025. She has multiple indications for SGLT2i with HF and CKD, but unable to evaluate fluid status today. Would prefer for her to be seen by HF given complicated diuretic regimen, but if she does not schedule follow-up, we can discuss initiation in the future. May also benefit from GLP-1RA or GLP-1/GIP RA in the future for glycemic control. - Last UACR March 2025 - 15 mg/g - Reviewed long term cardiovascular and renal outcomes of uncontrolled blood sugar - Recommend to continue metformin  XR 500 mg daily. Obtain BMP at PCP appt on 03/14/24 to assess eGFR.  - Next A1C due now.    Hyperlipidemia/ASCVD Risk Reduction: - Currently controlled with most recent LDL-C of 51 mg/dL below goal < 70 mg/dL given U7IF + co morbidities. Statin fill hx is appropriate.  - Reviewed long term complications of uncontrolled cholesterole - Recommend to resume rosuvastatin  10 mg daily   Heart Failure and Pulmonary HTN due to COPD: - Currently inappropriately managed/opportunity for optimization as patient has been lost to follow up with Adv HF clinic. Have encouraged her to schedule appt multiple times. Labs are stable, but she would likely benefit from addition of SGLT2i and MRA to reduce need for diuretic/potassium. Given complicated diuretic regimen and stage IV CKD, would prefer AHF to assess her prior to reinitiating HF GDMT.  - Recommend to continue current regimen: Current medications:  ACEi/ARB/ARNI: none SGLT2i: none Beta blocker: bisoprolol  5 mg daily (Afib) Mineralocorticoid Receptor Antagonist: none  Diuretic regimen: furosemide  40-80 mg daily for swelling and  ShOB, metolazone  2.5 mg daily PRN (~once per week) Potassium chloride  30 mEq BID with furosemide  80 mg daily - Enrolled in [employer]omy[employer] fund which can be used for brand name HF medications in the future   Atrial Fibrillation: - Patient is now appropriately treated with a DOAC.  She is concomitantly treated with diltiazem , a moderate CYP3A4 and PGP inhibitor which can increase the plasma concentration of Eliquis  and may increase risk of bleeding, however her CHADSVASC of 4 indicates a moderate-high risk of stroke. Will continue to monitor for side effect of minor bleeding.  - Reviewed importance of adherence to anticoagulant for stroke prevention.  - Continue Eliquis  5 mg BID. Will ensure pharmacy uses [employer] Cardiomopathy grant to cover deductible in Jan 2026.  - Continue bisoprolol  5 mg daily, diltiazem  360 mg daily   COPD: - Currently uncontrolled given frequent symptoms, though likely overlap with HF symptoms. Patient is now appropriately treated with triple therapy.  - Educated on maintenance vs rescue inhaler and inhaler technique. Encouraged daily dosing of maintenance inhaler to alleviate shortness of breath. - Recommend to continue Breztri  2 puffs BID - Recommend to continue albuterol  HFA 2 puff Q6H PRN, and ipratropium-albuterol  nebulizer solution 3 mL Q4H PRN for ShOB   Patient verbalized understanding of treatment plan.    Follow Up Plan:  Pharmacist telephone 03/21/24 PCP clinic visit 03/14/24   Lorain Baseman, PharmD [facility] [phone]   "

## 2024-02-23 ENCOUNTER — Other Ambulatory Visit: Payer: Self-pay | Admitting: Nurse Practitioner

## 2024-02-23 ENCOUNTER — Other Ambulatory Visit (HOSPITAL_COMMUNITY): Payer: Self-pay

## 2024-02-23 ENCOUNTER — Other Ambulatory Visit: Payer: Self-pay

## 2024-02-23 DIAGNOSIS — F32A Depression, unspecified: Secondary | ICD-10-CM

## 2024-02-23 DIAGNOSIS — F331 Major depressive disorder, recurrent, moderate: Secondary | ICD-10-CM

## 2024-02-23 MED ORDER — TRAZODONE HCL 100 MG PO TABS
ORAL_TABLET | ORAL | 1 refills | Status: AC
  Filled 2024-02-23: qty 30, fill #0
  Filled 2024-03-08: qty 30, 30d supply, fill #0

## 2024-02-23 NOTE — Telephone Encounter (Signed)
 Please advise [facility]

## 2024-02-28 ENCOUNTER — Other Ambulatory Visit (HOSPITAL_COMMUNITY): Payer: Self-pay

## 2024-02-28 ENCOUNTER — Other Ambulatory Visit: Payer: Self-pay

## 2024-02-29 ENCOUNTER — Other Ambulatory Visit (HOSPITAL_COMMUNITY): Payer: Self-pay

## 2024-02-29 ENCOUNTER — Other Ambulatory Visit: Payer: Self-pay

## 2024-03-08 ENCOUNTER — Other Ambulatory Visit: Payer: Self-pay

## 2024-03-08 DIAGNOSIS — E1122 Type 2 diabetes mellitus with diabetic chronic kidney disease: Secondary | ICD-10-CM

## 2024-03-08 MED ORDER — METFORMIN HCL ER 500 MG PO TB24: 500.0000 mg | ORAL_TABLET | Freq: Two times a day (BID) | ORAL | 0 refills | Status: DC

## 2024-03-09 ENCOUNTER — Other Ambulatory Visit: Payer: Self-pay

## 2024-03-09 DIAGNOSIS — E1122 Type 2 diabetes mellitus with diabetic chronic kidney disease: Secondary | ICD-10-CM

## 2024-03-09 MED ORDER — METFORMIN HCL ER 500 MG PO TB24
500.0000 mg | ORAL_TABLET | Freq: Two times a day (BID) | ORAL | 0 refills | Status: AC
  Filled 2024-03-09: qty 60, 30d supply, fill #0

## 2024-03-14 ENCOUNTER — Ambulatory Visit: Payer: Self-pay | Admitting: Nurse Practitioner

## 2024-03-21 ENCOUNTER — Other Ambulatory Visit: Payer: Self-pay

## 2024-04-09 ENCOUNTER — Encounter

## 2024-07-31 ENCOUNTER — Ambulatory Visit: Payer: Self-pay | Admitting: Neurology
